# Patient Record
Sex: Female | Born: 1935 | Race: White | Hispanic: No | Marital: Married | Smoking: Former smoker
Health system: Southern US, Community
[De-identification: ages and names within clinical notes are randomized; demographics above are authoritative.]

## PROBLEM LIST (undated history)

## (undated) DIAGNOSIS — I219 Acute myocardial infarction, unspecified: Secondary | ICD-10-CM

## (undated) DIAGNOSIS — E119 Type 2 diabetes mellitus without complications: Secondary | ICD-10-CM

## (undated) DIAGNOSIS — E7800 Pure hypercholesterolemia, unspecified: Secondary | ICD-10-CM

## (undated) DIAGNOSIS — G4733 Obstructive sleep apnea (adult) (pediatric): Secondary | ICD-10-CM

## (undated) DIAGNOSIS — J189 Pneumonia, unspecified organism: Secondary | ICD-10-CM

## (undated) DIAGNOSIS — I10 Essential (primary) hypertension: Secondary | ICD-10-CM

## (undated) DIAGNOSIS — F329 Major depressive disorder, single episode, unspecified: Secondary | ICD-10-CM

## (undated) DIAGNOSIS — M25519 Pain in unspecified shoulder: Secondary | ICD-10-CM

## (undated) DIAGNOSIS — C541 Malignant neoplasm of endometrium: Secondary | ICD-10-CM

## (undated) DIAGNOSIS — C801 Malignant (primary) neoplasm, unspecified: Secondary | ICD-10-CM

## (undated) DIAGNOSIS — I119 Hypertensive heart disease without heart failure: Secondary | ICD-10-CM

## (undated) DIAGNOSIS — F32A Depression, unspecified: Secondary | ICD-10-CM

## (undated) DIAGNOSIS — I469 Cardiac arrest, cause unspecified: Secondary | ICD-10-CM

## (undated) DIAGNOSIS — F419 Anxiety disorder, unspecified: Secondary | ICD-10-CM

## (undated) DIAGNOSIS — I509 Heart failure, unspecified: Secondary | ICD-10-CM

## (undated) DIAGNOSIS — M869 Osteomyelitis, unspecified: Secondary | ICD-10-CM

## (undated) DIAGNOSIS — G4730 Sleep apnea, unspecified: Secondary | ICD-10-CM

## (undated) DIAGNOSIS — I442 Atrioventricular block, complete: Secondary | ICD-10-CM

## (undated) DIAGNOSIS — I4891 Unspecified atrial fibrillation: Secondary | ICD-10-CM

## (undated) DIAGNOSIS — L980 Pyogenic granuloma: Secondary | ICD-10-CM

## (undated) HISTORY — DX: Pure hypercholesterolemia, unspecified: E78.00

## (undated) HISTORY — DX: Osteomyelitis, unspecified: M86.9

## (undated) HISTORY — DX: Type 2 diabetes mellitus without complications: E11.9

## (undated) HISTORY — DX: Atrioventricular block, complete: I44.2

## (undated) HISTORY — PX: KNEE SURGERY: SHX244

## (undated) HISTORY — DX: Morbid (severe) obesity due to excess calories: E66.01

## (undated) HISTORY — DX: Pyogenic granuloma: L98.0

## (undated) HISTORY — DX: Cardiac arrest, cause unspecified: I46.9

## (undated) HISTORY — PX: TOE AMPUTATION: SHX809

## (undated) HISTORY — DX: Pain in unspecified shoulder: M25.519

## (undated) HISTORY — DX: Malignant (primary) neoplasm, unspecified: C80.1

## (undated) HISTORY — PX: OTHER SURGICAL HISTORY: SHX169

## (undated) HISTORY — DX: Malignant neoplasm of endometrium: C54.1

## (undated) HISTORY — DX: Sleep apnea, unspecified: G47.30

## (undated) HISTORY — DX: Hypertensive heart disease without heart failure: I11.9

## (undated) HISTORY — PX: ABDOMINAL HYSTERECTOMY: SHX81

## (undated) HISTORY — DX: Obstructive sleep apnea (adult) (pediatric): G47.33

---

## 2002-08-20 ENCOUNTER — Encounter: Payer: Self-pay | Admitting: Emergency Medicine

## 2002-08-20 ENCOUNTER — Emergency Department (HOSPITAL_COMMUNITY): Admission: EM | Admit: 2002-08-20 | Discharge: 2002-08-20 | Payer: Self-pay | Admitting: Emergency Medicine

## 2005-04-28 ENCOUNTER — Other Ambulatory Visit: Admission: RE | Admit: 2005-04-28 | Discharge: 2005-04-28 | Payer: Self-pay | Admitting: *Deleted

## 2005-05-06 ENCOUNTER — Ambulatory Visit (HOSPITAL_COMMUNITY): Admission: RE | Admit: 2005-05-06 | Discharge: 2005-05-06 | Payer: Self-pay | Admitting: Obstetrics and Gynecology

## 2005-09-19 ENCOUNTER — Inpatient Hospital Stay (HOSPITAL_COMMUNITY): Admission: EM | Admit: 2005-09-19 | Discharge: 2005-09-25 | Payer: Self-pay | Admitting: Emergency Medicine

## 2005-09-24 ENCOUNTER — Encounter (INDEPENDENT_AMBULATORY_CARE_PROVIDER_SITE_OTHER): Payer: Self-pay | Admitting: Cardiology

## 2005-10-12 ENCOUNTER — Ambulatory Visit (HOSPITAL_COMMUNITY): Admission: RE | Admit: 2005-10-12 | Discharge: 2005-10-12 | Payer: Self-pay | Admitting: *Deleted

## 2006-03-04 ENCOUNTER — Encounter: Admission: RE | Admit: 2006-03-04 | Discharge: 2006-03-04 | Payer: Self-pay | Admitting: Family Medicine

## 2007-10-18 ENCOUNTER — Emergency Department (HOSPITAL_COMMUNITY): Admission: EM | Admit: 2007-10-18 | Discharge: 2007-10-18 | Payer: Self-pay | Admitting: Emergency Medicine

## 2008-10-23 ENCOUNTER — Inpatient Hospital Stay (HOSPITAL_COMMUNITY): Admission: AD | Admit: 2008-10-23 | Discharge: 2008-10-26 | Payer: Self-pay | Admitting: Internal Medicine

## 2008-10-24 ENCOUNTER — Encounter (INDEPENDENT_AMBULATORY_CARE_PROVIDER_SITE_OTHER): Payer: Self-pay | Admitting: Internal Medicine

## 2008-10-24 ENCOUNTER — Encounter (INDEPENDENT_AMBULATORY_CARE_PROVIDER_SITE_OTHER): Payer: Self-pay | Admitting: Specialist

## 2008-10-25 ENCOUNTER — Ambulatory Visit: Payer: Self-pay | Admitting: Internal Medicine

## 2008-10-25 ENCOUNTER — Ambulatory Visit: Payer: Self-pay | Admitting: Vascular Surgery

## 2009-07-31 ENCOUNTER — Encounter (HOSPITAL_BASED_OUTPATIENT_CLINIC_OR_DEPARTMENT_OTHER): Admission: RE | Admit: 2009-07-31 | Discharge: 2009-10-29 | Payer: Self-pay | Admitting: Internal Medicine

## 2009-08-02 ENCOUNTER — Ambulatory Visit (HOSPITAL_COMMUNITY): Admission: RE | Admit: 2009-08-02 | Discharge: 2009-08-02 | Payer: Self-pay | Admitting: Internal Medicine

## 2009-10-22 ENCOUNTER — Encounter: Payer: Self-pay | Admitting: Emergency Medicine

## 2009-10-23 ENCOUNTER — Inpatient Hospital Stay (HOSPITAL_COMMUNITY): Admission: EM | Admit: 2009-10-23 | Discharge: 2009-10-25 | Payer: Self-pay | Admitting: Internal Medicine

## 2009-10-30 ENCOUNTER — Encounter (HOSPITAL_BASED_OUTPATIENT_CLINIC_OR_DEPARTMENT_OTHER): Admission: RE | Admit: 2009-10-30 | Discharge: 2009-11-04 | Payer: Self-pay | Admitting: Internal Medicine

## 2009-11-05 ENCOUNTER — Encounter (HOSPITAL_BASED_OUTPATIENT_CLINIC_OR_DEPARTMENT_OTHER): Admission: RE | Admit: 2009-11-05 | Discharge: 2009-12-09 | Payer: Self-pay | Admitting: General Surgery

## 2010-02-24 ENCOUNTER — Encounter (HOSPITAL_BASED_OUTPATIENT_CLINIC_OR_DEPARTMENT_OTHER): Admission: RE | Admit: 2010-02-24 | Discharge: 2010-05-25 | Payer: Self-pay | Admitting: Internal Medicine

## 2010-02-28 ENCOUNTER — Ambulatory Visit (HOSPITAL_COMMUNITY): Admission: RE | Admit: 2010-02-28 | Discharge: 2010-02-28 | Payer: Self-pay | Admitting: Internal Medicine

## 2010-03-03 ENCOUNTER — Ambulatory Visit (HOSPITAL_COMMUNITY): Admission: RE | Admit: 2010-03-03 | Discharge: 2010-03-03 | Payer: Self-pay | Admitting: Internal Medicine

## 2010-03-11 ENCOUNTER — Encounter: Payer: Self-pay | Admitting: Emergency Medicine

## 2010-03-18 ENCOUNTER — Encounter: Payer: Self-pay | Admitting: Emergency Medicine

## 2010-03-18 ENCOUNTER — Encounter: Admission: RE | Admit: 2010-03-18 | Discharge: 2010-03-18 | Payer: Self-pay | Admitting: Cardiology

## 2010-03-24 ENCOUNTER — Ambulatory Visit (HOSPITAL_COMMUNITY): Admission: RE | Admit: 2010-03-24 | Discharge: 2010-03-24 | Payer: Self-pay | Admitting: Cardiology

## 2010-03-27 ENCOUNTER — Encounter: Payer: Self-pay | Admitting: Emergency Medicine

## 2010-04-24 ENCOUNTER — Encounter: Payer: Self-pay | Admitting: Emergency Medicine

## 2010-04-28 DIAGNOSIS — F411 Generalized anxiety disorder: Secondary | ICD-10-CM | POA: Insufficient documentation

## 2010-04-28 DIAGNOSIS — C549 Malignant neoplasm of corpus uteri, unspecified: Secondary | ICD-10-CM

## 2010-04-28 DIAGNOSIS — I119 Hypertensive heart disease without heart failure: Secondary | ICD-10-CM

## 2010-04-28 DIAGNOSIS — I2789 Other specified pulmonary heart diseases: Secondary | ICD-10-CM

## 2010-04-28 DIAGNOSIS — E119 Type 2 diabetes mellitus without complications: Secondary | ICD-10-CM

## 2010-04-28 DIAGNOSIS — M869 Osteomyelitis, unspecified: Secondary | ICD-10-CM | POA: Insufficient documentation

## 2010-04-28 DIAGNOSIS — I4891 Unspecified atrial fibrillation: Secondary | ICD-10-CM | POA: Insufficient documentation

## 2010-04-28 DIAGNOSIS — E7800 Pure hypercholesterolemia, unspecified: Secondary | ICD-10-CM

## 2010-04-28 HISTORY — DX: Osteomyelitis, unspecified: M86.9

## 2010-04-29 ENCOUNTER — Ambulatory Visit: Payer: Self-pay | Admitting: Emergency Medicine

## 2010-04-29 DIAGNOSIS — R05 Cough: Secondary | ICD-10-CM

## 2010-04-29 LAB — CONVERTED CEMR LAB
ANA Titer 1: 1:320 {titer} — ABNORMAL HIGH
Anti Nuclear Antibody(ANA): POSITIVE — AB

## 2010-05-04 ENCOUNTER — Inpatient Hospital Stay (HOSPITAL_COMMUNITY): Admission: EM | Admit: 2010-05-04 | Discharge: 2010-05-09 | Payer: Self-pay | Admitting: Emergency Medicine

## 2010-05-04 ENCOUNTER — Ambulatory Visit: Payer: Self-pay | Admitting: Internal Medicine

## 2010-05-05 ENCOUNTER — Ambulatory Visit: Payer: Self-pay | Admitting: Vascular Surgery

## 2010-05-05 ENCOUNTER — Encounter (INDEPENDENT_AMBULATORY_CARE_PROVIDER_SITE_OTHER): Payer: Self-pay | Admitting: Internal Medicine

## 2010-05-06 ENCOUNTER — Ambulatory Visit: Payer: Self-pay | Admitting: Internal Medicine

## 2010-05-09 ENCOUNTER — Telehealth: Payer: Self-pay | Admitting: Emergency Medicine

## 2010-07-31 ENCOUNTER — Ambulatory Visit: Payer: Self-pay | Admitting: Diagnostic Radiology

## 2010-07-31 ENCOUNTER — Inpatient Hospital Stay (HOSPITAL_COMMUNITY): Admission: EM | Admit: 2010-07-31 | Discharge: 2010-08-05 | Payer: Self-pay | Admitting: Internal Medicine

## 2010-07-31 ENCOUNTER — Encounter: Payer: Self-pay | Admitting: Emergency Medicine

## 2010-08-04 ENCOUNTER — Ambulatory Visit: Payer: Self-pay | Admitting: Physical Medicine & Rehabilitation

## 2010-08-05 ENCOUNTER — Inpatient Hospital Stay (HOSPITAL_COMMUNITY)
Admission: RE | Admit: 2010-08-05 | Discharge: 2010-08-12 | Payer: Self-pay | Admitting: Physical Medicine & Rehabilitation

## 2010-09-08 ENCOUNTER — Inpatient Hospital Stay (HOSPITAL_COMMUNITY): Admission: EM | Admit: 2010-09-08 | Discharge: 2010-09-15 | Payer: Self-pay | Admitting: Emergency Medicine

## 2010-09-11 ENCOUNTER — Encounter (INDEPENDENT_AMBULATORY_CARE_PROVIDER_SITE_OTHER): Payer: Self-pay | Admitting: Internal Medicine

## 2010-10-25 ENCOUNTER — Emergency Department (HOSPITAL_COMMUNITY)
Admission: EM | Admit: 2010-10-25 | Discharge: 2010-10-25 | Payer: Self-pay | Source: Home / Self Care | Admitting: Emergency Medicine

## 2010-11-06 ENCOUNTER — Encounter (HOSPITAL_BASED_OUTPATIENT_CLINIC_OR_DEPARTMENT_OTHER)
Admission: RE | Admit: 2010-11-06 | Discharge: 2010-12-09 | Payer: Self-pay | Source: Home / Self Care | Attending: Internal Medicine | Admitting: Internal Medicine

## 2010-11-13 ENCOUNTER — Ambulatory Visit (HOSPITAL_COMMUNITY)
Admission: RE | Admit: 2010-11-13 | Discharge: 2010-11-13 | Payer: Self-pay | Source: Home / Self Care | Attending: Internal Medicine | Admitting: Internal Medicine

## 2010-11-13 LAB — COMPREHENSIVE METABOLIC PANEL
ALT: 17 U/L (ref 0–35)
AST: 28 U/L (ref 0–37)
Albumin: 3.2 g/dL — ABNORMAL LOW (ref 3.5–5.2)
Alkaline Phosphatase: 126 U/L — ABNORMAL HIGH (ref 39–117)
BUN: 40 mg/dL — ABNORMAL HIGH (ref 6–23)
CO2: 30 mEq/L (ref 19–32)
Calcium: 9.6 mg/dL (ref 8.4–10.5)
Chloride: 94 mEq/L — ABNORMAL LOW (ref 96–112)
Creatinine, Ser: 1.13 mg/dL (ref 0.4–1.2)
GFR calc Af Amer: 57 mL/min — ABNORMAL LOW (ref >60)
GFR calc non Af Amer: 47 mL/min — ABNORMAL LOW (ref >60)
Glucose, Bld: 249 mg/dL — ABNORMAL HIGH (ref 70–99)
Potassium: 4 mEq/L (ref 3.5–5.1)
Sodium: 132 mEq/L — ABNORMAL LOW (ref 135–145)
Total Bilirubin: 0.8 mg/dL (ref 0.3–1.2)
Total Protein: 8.7 g/dL — ABNORMAL HIGH (ref 6.0–8.3)

## 2010-11-13 LAB — CBC
HCT: 40.9 % (ref 36.0–46.0)
Hemoglobin: 13.8 g/dL (ref 12.0–15.0)
MCH: 29.2 pg (ref 26.0–34.0)
MCHC: 33.7 g/dL (ref 30.0–36.0)
MCV: 86.5 fL (ref 78.0–100.0)
Platelets: 233 10*3/uL (ref 150–400)
RBC: 4.73 MIL/uL (ref 3.87–5.11)
RDW: 14.3 % (ref 11.5–15.5)
WBC: 7.3 10*3/uL (ref 4.0–10.5)

## 2010-11-13 LAB — DIFFERENTIAL
Basophils Absolute: 0.1 10*3/uL (ref 0.0–0.1)
Basophils Relative: 1 % (ref 0–1)
Eosinophils Absolute: 0.2 10*3/uL (ref 0.0–0.7)
Eosinophils Relative: 3 % (ref 0–5)
Lymphocytes Relative: 27 % (ref 12–46)
Lymphs Abs: 1.9 10*3/uL (ref 0.7–4.0)
Monocytes Absolute: 0.9 10*3/uL (ref 0.1–1.0)
Monocytes Relative: 12 % (ref 3–12)
Neutro Abs: 4.2 10*3/uL (ref 1.7–7.7)
Neutrophils Relative %: 58 % (ref 43–77)

## 2010-11-13 LAB — HEMOGLOBIN A1C
Hgb A1c MFr Bld: 7.4 % — ABNORMAL HIGH (ref <5.7)
Mean Plasma Glucose: 166 mg/dL — ABNORMAL HIGH (ref <117)

## 2010-11-13 LAB — SEDIMENTATION RATE: Sed Rate: 53 mm/hr — ABNORMAL HIGH (ref 0–22)

## 2010-11-13 LAB — PREALBUMIN: Prealbumin: 19.3 mg/dL (ref 17.0–34.0)

## 2010-11-14 ENCOUNTER — Ambulatory Visit (HOSPITAL_COMMUNITY)
Admission: RE | Admit: 2010-11-14 | Discharge: 2010-11-14 | Payer: Self-pay | Source: Home / Self Care | Attending: Internal Medicine | Admitting: Internal Medicine

## 2010-11-26 LAB — GLUCOSE, CAPILLARY
Glucose-Capillary: 228 mg/dL — ABNORMAL HIGH (ref 70–99)
Glucose-Capillary: 194 mg/dL — ABNORMAL HIGH (ref 70–99)

## 2010-11-30 ENCOUNTER — Encounter: Payer: Self-pay | Admitting: Nephrology

## 2010-12-01 LAB — GLUCOSE, CAPILLARY
Glucose-Capillary: 279 mg/dL — ABNORMAL HIGH (ref 70–99)
Glucose-Capillary: 241 mg/dL — ABNORMAL HIGH (ref 70–99)

## 2010-12-02 LAB — GLUCOSE, CAPILLARY: Glucose-Capillary: 202 mg/dL — ABNORMAL HIGH (ref 70–99)

## 2010-12-03 LAB — GLUCOSE, CAPILLARY
Glucose-Capillary: 215 mg/dL — ABNORMAL HIGH (ref 70–99)
Glucose-Capillary: 185 mg/dL — ABNORMAL HIGH (ref 70–99)

## 2010-12-04 LAB — GLUCOSE, CAPILLARY: Glucose-Capillary: 197 mg/dL — ABNORMAL HIGH (ref 70–99)

## 2010-12-05 LAB — GLUCOSE, CAPILLARY: Glucose-Capillary: 225 mg/dL — ABNORMAL HIGH (ref 70–99)

## 2010-12-09 NOTE — Progress Notes (Signed)
Summary: lab results---ATC x1. No answer. WCB.  Phone Note Outgoing Call Call back at Home Phone [phone]   Call placed by: Leslye Peer MD,  May 09, 2010 2:34 PM Call placed to: Patient Summary of Call: Called to review auto-immune labs with pt. Her ANA and RF are markedly positive. I would like to send her to see rheumatology while we sort out her OSA therapy. We will try her back. Told her to call us back at [phone].  Initial call taken by: Leslye Peer MD,  May 09, 2010 2:36 PM  Follow-up for Phone Call        ATCx1. No answer. Unable to leave a msg. WCB.Michel Bickers [facility]  May 14, 2010 5:38 PM

## 2010-12-09 NOTE — Assessment & Plan Note (Signed)
Summary: pulm HTN, OSA   Visit Type:  Initial Consult Copy to:  Dr. Armanda Magic Primary Provider/Referring Provider:  Dr. Merri Brunette  CC:  Pulmonary hypertension consult. Marland Kitchen  History of Present Illness: 77 yo woman with obesity, A Fib, HTN, DM 2, OSA not able to tolerate CPAP. Also with pulm HTN, TR and decreased RV fxn by TTE performed 03/2010. Referred for progression of her pulm HTN, dyspnea when laying supine or with significant exertion. As part of her workup had CT-PA 5/11 that showed no PE, no ILD. She has also had PFT done.   She describes nocturnal obstructions and awakenings. Has tried multiple masks without being able to tolerate. Also complains of PND and cough, cough with drinking thin liquids  Preventive Screening-Counseling & Management  Alcohol-Tobacco     Alcohol drinks/day: 0     Smoking Status: quit     Packs/Day: 2.0     Year Quit: 1990     Pack years: 40  Current Medications (verified): 1)  Amlodipine Besy-Benazepril Hcl 5-20 Mg Caps (Amlodipine Besy-Benazepril Hcl) .Marland Kitchen.. 1 By Mouth Daily 2)  Diltiazem Hcl Er Beads 180 Mg Xr24h-Cap (Diltiazem Hcl Er Beads) .Marland Kitchen.. 1 By Mouth Daily 3)  Lantus 100 Unit/ml Soln (Insulin Glargine) .... As Directed 4)  Glimepiride 4 Mg Tabs (Glimepiride) .Marland Kitchen.. 1 By Mouth Two Times A Day 5)  Tramadol Hcl 50 Mg Tabs (Tramadol Hcl) .Marland Kitchen.. 1-2  By Mouth Every 6 Hours As Needed 6)  Digoxin 0.25 Mg Tabs (Digoxin) .Marland Kitchen.. 1 By Mouth Daily 7)  Sertraline Hcl 50 Mg Tabs (Sertraline Hcl) .... 1/2 By Mouth At Bedtime 8)  Xanax 0.25 Mg Tabs (Alprazolam) .Marland Kitchen.. 1 By Mouth As Directed 9)  Furosemide 40 Mg Tabs (Furosemide) .Marland Kitchen.. 1 By Mouth Two Times A Day 10)  Metoprolol Tartrate 50 Mg Tabs (Metoprolol Tartrate) .... 1/2 By Mouth Two Times A Day  Allergies: No Known Drug Allergies  Past History:  Past Surgical History: Total Abdominal Hysterectomy in 2006 Toe removed on left foot  Family History: Bone cancer---father Intestinal  cancer---mother  Social History: Patient states former smoker. Quit in 1992. 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  The patient denies productive cough, non-productive cough, coughing up blood, chest pain, irregular heartbeats, acid heartburn, indigestion, loss of appetite, weight change, abdominal pain, difficulty swallowing, sore throat, tooth/dental problems, headaches, nasal congestion/difficulty breathing through nose, sneezing, ear ache, anxiety, depression, hand/feet swelling, joint stiffness or pain, rash, change in color of mucus, and fever.    Vital Signs:  Patient profile:   77 year old female Height:      68 inches (172.72 cm) Weight:      280 pounds (127.27 kg) BMI:     42.73 O2 Sat:      97 % on Room air Temp:     97.7 degrees F (36.50 degrees C) oral Pulse rate:   94 / minute BP sitting:   122 / 80  (left arm) Cuff size:   large  Vitals Entered By: Michel Bickers CMA (April 29, 2010 1:51 PM)  O2 Sat at Rest %:  97 O2 Flow:  Room air  Serial Vital Signs/Assessments:  Comments: 2:25 PM Ambulatory Pulse Oximetry  Resting; HR__88___    02 Sat__95%ra___  Lap1 (185 feet)  HR__91%ra___   02 Sat__90___ Lap2 (185 feet)   HR_____   02 Sat_____    Lap3 (185 feet)   HR_____   02 Sat_____  ___Test Completed without Difficulty _x__Test Stopped due to: Pt c/o SOB.   By: Vernie Murders   CC: Pulmonary hypertension consult.  Is Patient Diabetic? Yes Comments Medications reviewed. Daytime phone verified. Michel Bickers [facility]  April 29, 2010 1:53 PM   Physical Exam  General:  normal appearance, healthy appearing, and obese.   Head:  normocephalic and atraumatic Eyes:  conjunctiva and sclera clear Nose:  no deformity, discharge, inflammation, or lesions Mouth:  no deformity or  lesions Neck:  no stridor Lungs:  clear bilaterally to auscultation and percussion Heart:  irregular, S4 Abdomen:  obese, NT Extremities:  trace pretibial edema, LLE with chronic wound Neurologic:  non-focal Skin:  intact without lesions or rashes Psych:  alert and cooperative; normal mood and affect; normal attention span and concentration   Echocardiogram  Procedure date:  03/11/2010  Findings:      LV fxn 55%  mild LAE mild RV dilation estimated RVSP at 55-33mmHg   CT of Chest  Procedure date:  03/18/2010  Findings:      No PE cardiomegaly mild biapical scarring   Impression & Recommendations:  Problem # 1:  PULMONARY HYPERTENSION (ICD-416.8)  I agree with Dr Mayford Knife that this is most likely due to untreated OSA, although ? whether she has primary TR also that could be contributing to her decreased RV fxn. CT-PA was reassuring. PFT show - walking oximetry today - discussed alternative treatments for OSA with her including surgery or dental device. She is willing to discuss the options, will refer her for sleep eval  - auto-immune labs today - consider R heart cath, but at this time would defer so that we can try to get the OSA addressed. may become relevant if we need to eval the TR further or if we decide to treat pulm HTN after underlying contributors have been addresssed.   Orders: TLB-Rheumatoid Factor (RA) (13086-VH) T-Antinuclear Antib (ANA) [phone]) T- * Misc. Laboratory test [phone]) Sleep Disorder Referral (Sleep Disorder) Consultation Level IV (40102) Prescription Created Electronically [phone])  Problem # 2:  COUGH (ICD-786.2)  Orders: Prescription Created Electronically [phone])  Medications Added to Medication List This Visit: 1)  Loratadine 10 Mg Tabs (Loratadine) .Marland Kitchen.. 1 by mouth once daily  Patient Instructions: 1)  Walking oximetry today showed that you do not need oxygen with activity 2)  We will refer you to see Dr Craige Cotta regarding  alternative therapies for Sleep Apnea.  3)  We will perform bloodwork today.  4)  Start loratadine 10mg  by mouth once daily  5)  Start Nasonex 2 sprays each nostril once daily  6)  Follow up with Dr Delton Coombes in 1 month to review your lab work.  Prescriptions: LORATADINE 10 MG TABS (LORATADINE) 1 by mouth once daily  #30 x 5   Entered and Authorized by:   Leslye Peer MD   Signed by:   Leslye Peer MD on 04/29/2010   Method used:   Electronically to        [facility] [phone]* (retail)       [address]       [place], [place]  [address]       Ph: [phone]       Fax: [phone]   RxID:   [id number]  Appended Document: Orders Update    Clinical Lists Changes  Orders: Added new Test order of T- * Misc. Laboratory test [phone]) - Signed

## 2010-12-09 NOTE — Letter (Signed)
Summary: [facility]  [facility]   Imported By: Lester [place] 05/01/2010 10:46:21  _____________________________________________________________________  External Attachment:    Type:   Image     Comment:   External Document

## 2010-12-10 ENCOUNTER — Other Ambulatory Visit: Payer: Self-pay

## 2010-12-10 ENCOUNTER — Encounter (HOSPITAL_BASED_OUTPATIENT_CLINIC_OR_DEPARTMENT_OTHER): Payer: Medicare Other | Attending: General Surgery

## 2010-12-10 DIAGNOSIS — E1142 Type 2 diabetes mellitus with diabetic polyneuropathy: Secondary | ICD-10-CM | POA: Insufficient documentation

## 2010-12-10 DIAGNOSIS — E1149 Type 2 diabetes mellitus with other diabetic neurological complication: Secondary | ICD-10-CM | POA: Insufficient documentation

## 2010-12-10 DIAGNOSIS — I798 Other disorders of arteries, arterioles and capillaries in diseases classified elsewhere: Secondary | ICD-10-CM | POA: Insufficient documentation

## 2010-12-10 DIAGNOSIS — M869 Osteomyelitis, unspecified: Secondary | ICD-10-CM | POA: Insufficient documentation

## 2010-12-10 DIAGNOSIS — L97509 Non-pressure chronic ulcer of other part of unspecified foot with unspecified severity: Secondary | ICD-10-CM | POA: Insufficient documentation

## 2010-12-10 DIAGNOSIS — E1169 Type 2 diabetes mellitus with other specified complication: Secondary | ICD-10-CM | POA: Insufficient documentation

## 2010-12-10 DIAGNOSIS — E1159 Type 2 diabetes mellitus with other circulatory complications: Secondary | ICD-10-CM | POA: Insufficient documentation

## 2010-12-10 DIAGNOSIS — M9080 Osteopathy in diseases classified elsewhere, unspecified site: Secondary | ICD-10-CM | POA: Insufficient documentation

## 2010-12-11 ENCOUNTER — Other Ambulatory Visit: Payer: Self-pay

## 2010-12-12 ENCOUNTER — Other Ambulatory Visit: Payer: Self-pay

## 2010-12-15 ENCOUNTER — Encounter (HOSPITAL_BASED_OUTPATIENT_CLINIC_OR_DEPARTMENT_OTHER): Payer: Medicare Other

## 2010-12-15 ENCOUNTER — Other Ambulatory Visit: Payer: Self-pay

## 2010-12-16 ENCOUNTER — Other Ambulatory Visit: Payer: Self-pay

## 2010-12-16 LAB — GLUCOSE, CAPILLARY
Glucose-Capillary: 240 mg/dL — ABNORMAL HIGH (ref 70–99)
Glucose-Capillary: 198 mg/dL — ABNORMAL HIGH (ref 70–99)
Glucose-Capillary: 220 mg/dL — ABNORMAL HIGH (ref 70–99)
Glucose-Capillary: 263 mg/dL — ABNORMAL HIGH (ref 70–99)

## 2010-12-17 ENCOUNTER — Other Ambulatory Visit: Payer: Self-pay

## 2010-12-18 LAB — GLUCOSE, CAPILLARY
Glucose-Capillary: 227 mg/dL — ABNORMAL HIGH (ref 70–99)
Glucose-Capillary: 284 mg/dL — ABNORMAL HIGH (ref 70–99)
Glucose-Capillary: 229 mg/dL — ABNORMAL HIGH (ref 70–99)

## 2010-12-22 ENCOUNTER — Other Ambulatory Visit: Payer: Self-pay

## 2010-12-23 ENCOUNTER — Other Ambulatory Visit: Payer: Self-pay

## 2010-12-23 LAB — GLUCOSE, CAPILLARY: Glucose-Capillary: 195 mg/dL — ABNORMAL HIGH (ref 70–99)

## 2010-12-24 ENCOUNTER — Other Ambulatory Visit: Payer: Self-pay

## 2010-12-24 LAB — GLUCOSE, CAPILLARY: Glucose-Capillary: 256 mg/dL — ABNORMAL HIGH (ref 70–99)

## 2010-12-25 ENCOUNTER — Other Ambulatory Visit: Payer: Self-pay

## 2010-12-26 ENCOUNTER — Other Ambulatory Visit: Payer: Self-pay

## 2010-12-26 LAB — GLUCOSE, CAPILLARY
Glucose-Capillary: 222 mg/dL — ABNORMAL HIGH (ref 70–99)
Glucose-Capillary: 199 mg/dL — ABNORMAL HIGH (ref 70–99)
Glucose-Capillary: 176 mg/dL — ABNORMAL HIGH (ref 70–99)

## 2010-12-30 ENCOUNTER — Other Ambulatory Visit: Payer: Self-pay

## 2010-12-30 LAB — GLUCOSE, CAPILLARY: Glucose-Capillary: 212 mg/dL — ABNORMAL HIGH (ref 70–99)

## 2010-12-31 ENCOUNTER — Other Ambulatory Visit: Payer: Self-pay

## 2011-01-01 ENCOUNTER — Other Ambulatory Visit: Payer: Self-pay

## 2011-01-01 LAB — GLUCOSE, CAPILLARY
Glucose-Capillary: 195 mg/dL — ABNORMAL HIGH (ref 70–99)
Glucose-Capillary: 169 mg/dL — ABNORMAL HIGH (ref 70–99)

## 2011-01-02 ENCOUNTER — Other Ambulatory Visit: Payer: Self-pay

## 2011-01-02 LAB — GLUCOSE, CAPILLARY
Glucose-Capillary: 222 mg/dL — ABNORMAL HIGH (ref 70–99)
Glucose-Capillary: 187 mg/dL — ABNORMAL HIGH (ref 70–99)

## 2011-01-05 ENCOUNTER — Other Ambulatory Visit: Payer: Self-pay

## 2011-01-06 ENCOUNTER — Other Ambulatory Visit: Payer: Self-pay

## 2011-01-07 ENCOUNTER — Other Ambulatory Visit: Payer: Self-pay

## 2011-01-08 ENCOUNTER — Other Ambulatory Visit: Payer: Self-pay

## 2011-01-08 ENCOUNTER — Encounter (HOSPITAL_BASED_OUTPATIENT_CLINIC_OR_DEPARTMENT_OTHER): Payer: Medicare Other | Attending: General Surgery

## 2011-01-08 DIAGNOSIS — E1169 Type 2 diabetes mellitus with other specified complication: Secondary | ICD-10-CM | POA: Insufficient documentation

## 2011-01-08 DIAGNOSIS — L97509 Non-pressure chronic ulcer of other part of unspecified foot with unspecified severity: Secondary | ICD-10-CM | POA: Insufficient documentation

## 2011-01-08 DIAGNOSIS — I798 Other disorders of arteries, arterioles and capillaries in diseases classified elsewhere: Secondary | ICD-10-CM | POA: Insufficient documentation

## 2011-01-08 DIAGNOSIS — E1159 Type 2 diabetes mellitus with other circulatory complications: Secondary | ICD-10-CM | POA: Insufficient documentation

## 2011-01-08 DIAGNOSIS — E1149 Type 2 diabetes mellitus with other diabetic neurological complication: Secondary | ICD-10-CM | POA: Insufficient documentation

## 2011-01-08 DIAGNOSIS — M9080 Osteopathy in diseases classified elsewhere, unspecified site: Secondary | ICD-10-CM | POA: Insufficient documentation

## 2011-01-08 DIAGNOSIS — E1142 Type 2 diabetes mellitus with diabetic polyneuropathy: Secondary | ICD-10-CM | POA: Insufficient documentation

## 2011-01-08 DIAGNOSIS — M869 Osteomyelitis, unspecified: Secondary | ICD-10-CM | POA: Insufficient documentation

## 2011-01-08 LAB — GLUCOSE, CAPILLARY: Glucose-Capillary: 208 mg/dL — ABNORMAL HIGH (ref 70–99)

## 2011-01-09 ENCOUNTER — Other Ambulatory Visit: Payer: Self-pay

## 2011-01-12 ENCOUNTER — Other Ambulatory Visit: Payer: Self-pay

## 2011-01-12 ENCOUNTER — Encounter (HOSPITAL_BASED_OUTPATIENT_CLINIC_OR_DEPARTMENT_OTHER): Payer: Medicare Other

## 2011-01-12 LAB — GLUCOSE, CAPILLARY: Glucose-Capillary: 215 mg/dL — ABNORMAL HIGH (ref 70–99)

## 2011-01-13 ENCOUNTER — Other Ambulatory Visit: Payer: Self-pay

## 2011-01-13 LAB — GLUCOSE, CAPILLARY
Glucose-Capillary: 241 mg/dL — ABNORMAL HIGH (ref 70–99)
Glucose-Capillary: 217 mg/dL — ABNORMAL HIGH (ref 70–99)

## 2011-01-14 ENCOUNTER — Encounter (HOSPITAL_BASED_OUTPATIENT_CLINIC_OR_DEPARTMENT_OTHER): Payer: Medicare Other

## 2011-01-20 LAB — BASIC METABOLIC PANEL
BUN: 10 mg/dL (ref 6–23)
BUN: 10 mg/dL (ref 6–23)
CO2: 24 mEq/L (ref 19–32)
CO2: 25 mEq/L (ref 19–32)
CO2: 27 mEq/L (ref 19–32)
Calcium: 8.6 mg/dL (ref 8.4–10.5)
Calcium: 8.7 mg/dL (ref 8.4–10.5)
Calcium: 8.8 mg/dL (ref 8.4–10.5)
Chloride: 103 mEq/L (ref 96–112)
Chloride: 105 mEq/L (ref 96–112)
Chloride: 106 mEq/L (ref 96–112)
Creatinine, Ser: 0.82 mg/dL (ref 0.4–1.2)
Creatinine, Ser: 0.84 mg/dL (ref 0.4–1.2)
Creatinine, Ser: 0.88 mg/dL (ref 0.4–1.2)
Creatinine, Ser: 0.97 mg/dL (ref 0.4–1.2)
Creatinine, Ser: 1 mg/dL (ref 0.4–1.2)
GFR calc Af Amer: >60 mL/min (ref >60)
GFR calc Af Amer: >60 mL/min (ref >60)
GFR calc Af Amer: >60 mL/min (ref >60)
GFR calc Af Amer: >60 mL/min (ref >60)
GFR calc non Af Amer: 56 mL/min — ABNORMAL LOW (ref >60)
GFR calc non Af Amer: >60 mL/min (ref >60)
GFR calc non Af Amer: >60 mL/min (ref >60)
Glucose, Bld: 80 mg/dL (ref 70–99)
Potassium: 4 mEq/L (ref 3.5–5.1)
Sodium: 136 mEq/L (ref 135–145)
Sodium: 139 mEq/L (ref 135–145)

## 2011-01-20 LAB — BODY FLUID CELL COUNT WITH DIFFERENTIAL
Lymphs, Fluid: 61 %
Monocyte-Macrophage-Serous Fluid: 26 % — ABNORMAL LOW (ref 50–90)
Neutrophil Count, Fluid: 13 % (ref 0–25)

## 2011-01-20 LAB — GLUCOSE, CAPILLARY
Glucose-Capillary: 101 mg/dL — ABNORMAL HIGH (ref 70–99)
Glucose-Capillary: 103 mg/dL — ABNORMAL HIGH (ref 70–99)
Glucose-Capillary: 103 mg/dL — ABNORMAL HIGH (ref 70–99)
Glucose-Capillary: 109 mg/dL — ABNORMAL HIGH (ref 70–99)
Glucose-Capillary: 110 mg/dL — ABNORMAL HIGH (ref 70–99)
Glucose-Capillary: 114 mg/dL — ABNORMAL HIGH (ref 70–99)
Glucose-Capillary: 114 mg/dL — ABNORMAL HIGH (ref 70–99)
Glucose-Capillary: 116 mg/dL — ABNORMAL HIGH (ref 70–99)
Glucose-Capillary: 132 mg/dL — ABNORMAL HIGH (ref 70–99)
Glucose-Capillary: 138 mg/dL — ABNORMAL HIGH (ref 70–99)
Glucose-Capillary: 145 mg/dL — ABNORMAL HIGH (ref 70–99)
Glucose-Capillary: 162 mg/dL — ABNORMAL HIGH (ref 70–99)
Glucose-Capillary: 78 mg/dL (ref 70–99)
Glucose-Capillary: 81 mg/dL (ref 70–99)
Glucose-Capillary: 82 mg/dL (ref 70–99)
Glucose-Capillary: 97 mg/dL (ref 70–99)
Glucose-Capillary: 99 mg/dL (ref 70–99)

## 2011-01-20 LAB — HEMOGLOBIN A1C: Mean Plasma Glucose: 143 mg/dL — ABNORMAL HIGH (ref <117)

## 2011-01-20 LAB — AFB CULTURE WITH SMEAR (NOT AT ARMC)

## 2011-01-20 LAB — HEPATIC FUNCTION PANEL
AST: 44 U/L — ABNORMAL HIGH (ref 0–37)
Albumin: 2.6 g/dL — ABNORMAL LOW (ref 3.5–5.2)
Total Protein: 6.9 g/dL (ref 6.0–8.3)

## 2011-01-20 LAB — CBC
HCT: 34.5 % — ABNORMAL LOW (ref 36.0–46.0)
MCH: 28.3 pg (ref 26.0–34.0)
MCH: 29 pg (ref 26.0–34.0)
MCV: 87.2 fL (ref 78.0–100.0)
MCV: 87.8 fL (ref 78.0–100.0)
Platelets: 251 10*3/uL (ref 150–400)
Platelets: 256 10*3/uL (ref 150–400)
RBC: 3.93 MIL/uL (ref 3.87–5.11)
RBC: 4.07 MIL/uL (ref 3.87–5.11)

## 2011-01-20 LAB — DIFFERENTIAL
Basophils Absolute: 0 10*3/uL (ref 0.0–0.1)
Lymphocytes Relative: 14 % (ref 12–46)
Neutro Abs: 4.6 10*3/uL (ref 1.7–7.7)

## 2011-01-20 LAB — PHOSPHORUS: Phosphorus: 3.1 mg/dL (ref 2.3–4.6)

## 2011-01-20 LAB — ALBUMIN, FLUID (OTHER): Albumin, Fluid: 1.4 g/dL

## 2011-01-20 LAB — DIGOXIN LEVEL: Digoxin Level: 0.9 ng/mL (ref 0.8–2.0)

## 2011-01-20 LAB — PROTEIN, BODY FLUID: Total protein, fluid: <3 g/dL

## 2011-01-20 LAB — BODY FLUID CULTURE: Culture: NO GROWTH

## 2011-01-20 LAB — GLUCOSE, SEROUS FLUID

## 2011-01-21 LAB — POCT I-STAT, CHEM 8
Chloride: 104 mEq/L (ref 96–112)
Glucose, Bld: 85 mg/dL (ref 70–99)
HCT: 39 % (ref 36.0–46.0)
Hemoglobin: 13.3 g/dL (ref 12.0–15.0)
Potassium: 3.7 mEq/L (ref 3.5–5.1)
Sodium: 138 mEq/L (ref 135–145)
TCO2: 25 mmol/L (ref 0–100)

## 2011-01-21 LAB — URINALYSIS, ROUTINE W REFLEX MICROSCOPIC
Nitrite: NEGATIVE
Protein, ur: NEGATIVE mg/dL
Specific Gravity, Urine: 1.013 (ref 1.005–1.030)
pH: 5.5 (ref 5.0–8.0)

## 2011-01-21 LAB — COMPREHENSIVE METABOLIC PANEL
ALT: 16 U/L (ref 0–35)
Calcium: 8.7 mg/dL (ref 8.4–10.5)
GFR calc Af Amer: >60 mL/min (ref >60)
Glucose, Bld: 85 mg/dL (ref 70–99)
Sodium: 136 mEq/L (ref 135–145)
Total Protein: 7.4 g/dL (ref 6.0–8.3)

## 2011-01-21 LAB — CBC
HCT: 36.7 % (ref 36.0–46.0)
Hemoglobin: 11.9 g/dL — ABNORMAL LOW (ref 12.0–15.0)
MCH: 28.7 pg (ref 26.0–34.0)
MCHC: 32.4 g/dL (ref 30.0–36.0)
MCV: 88.4 fL (ref 78.0–100.0)
Platelets: 275 10*3/uL (ref 150–400)
RDW: 17.2 % — ABNORMAL HIGH (ref 11.5–15.5)

## 2011-01-21 LAB — GLUCOSE, CAPILLARY
Glucose-Capillary: 122 mg/dL — ABNORMAL HIGH (ref 70–99)
Glucose-Capillary: 76 mg/dL (ref 70–99)

## 2011-01-21 LAB — DIFFERENTIAL
Basophils Relative: 1 % (ref 0–1)
Eosinophils Absolute: 0.1 10*3/uL (ref 0.0–0.7)
Lymphocytes Relative: 12 % (ref 12–46)
Lymphs Abs: 0.9 10*3/uL (ref 0.7–4.0)
Monocytes Relative: 13 % — ABNORMAL HIGH (ref 3–12)
Neutrophils Relative %: 73 % (ref 43–77)

## 2011-01-21 LAB — LIPASE, BLOOD: Lipase: 26 U/L (ref 11–59)

## 2011-01-21 LAB — TROPONIN I: Troponin I: 0.01 ng/mL (ref 0.00–0.06)

## 2011-01-22 ENCOUNTER — Other Ambulatory Visit: Payer: Self-pay

## 2011-01-22 ENCOUNTER — Other Ambulatory Visit (HOSPITAL_BASED_OUTPATIENT_CLINIC_OR_DEPARTMENT_OTHER): Payer: Self-pay | Admitting: General Surgery

## 2011-01-22 ENCOUNTER — Ambulatory Visit (HOSPITAL_COMMUNITY)
Admission: RE | Admit: 2011-01-22 | Discharge: 2011-01-22 | Disposition: A | Discharge status: Home / Self Care | Payer: Medicare Other | Source: Ambulatory Visit | Attending: General Surgery | Admitting: General Surgery

## 2011-01-22 DIAGNOSIS — M869 Osteomyelitis, unspecified: Secondary | ICD-10-CM | POA: Insufficient documentation

## 2011-01-22 DIAGNOSIS — M79675 Pain in left toe(s): Secondary | ICD-10-CM

## 2011-01-22 DIAGNOSIS — X58XXXA Exposure to other specified factors, initial encounter: Secondary | ICD-10-CM | POA: Insufficient documentation

## 2011-01-22 DIAGNOSIS — S99929A Unspecified injury of unspecified foot, initial encounter: Secondary | ICD-10-CM | POA: Insufficient documentation

## 2011-01-22 DIAGNOSIS — S8990XA Unspecified injury of unspecified lower leg, initial encounter: Secondary | ICD-10-CM | POA: Insufficient documentation

## 2011-01-22 LAB — GLUCOSE, CAPILLARY
Glucose-Capillary: 104 mg/dL — ABNORMAL HIGH (ref 70–99)
Glucose-Capillary: 128 mg/dL — ABNORMAL HIGH (ref 70–99)
Glucose-Capillary: 149 mg/dL — ABNORMAL HIGH (ref 70–99)
Glucose-Capillary: 152 mg/dL — ABNORMAL HIGH (ref 70–99)
Glucose-Capillary: 57 mg/dL — ABNORMAL LOW (ref 70–99)
Glucose-Capillary: 61 mg/dL — ABNORMAL LOW (ref 70–99)
Glucose-Capillary: 103 mg/dL — ABNORMAL HIGH (ref 70–99)
Glucose-Capillary: 105 mg/dL — ABNORMAL HIGH (ref 70–99)
Glucose-Capillary: 105 mg/dL — ABNORMAL HIGH (ref 70–99)
Glucose-Capillary: 107 mg/dL — ABNORMAL HIGH (ref 70–99)
Glucose-Capillary: 117 mg/dL — ABNORMAL HIGH (ref 70–99)
Glucose-Capillary: 120 mg/dL — ABNORMAL HIGH (ref 70–99)
Glucose-Capillary: 126 mg/dL — ABNORMAL HIGH (ref 70–99)
Glucose-Capillary: 128 mg/dL — ABNORMAL HIGH (ref 70–99)
Glucose-Capillary: 128 mg/dL — ABNORMAL HIGH (ref 70–99)
Glucose-Capillary: 129 mg/dL — ABNORMAL HIGH (ref 70–99)
Glucose-Capillary: 132 mg/dL — ABNORMAL HIGH (ref 70–99)
Glucose-Capillary: 149 mg/dL — ABNORMAL HIGH (ref 70–99)
Glucose-Capillary: 149 mg/dL — ABNORMAL HIGH (ref 70–99)
Glucose-Capillary: 163 mg/dL — ABNORMAL HIGH (ref 70–99)
Glucose-Capillary: 73 mg/dL (ref 70–99)
Glucose-Capillary: 91 mg/dL (ref 70–99)
Glucose-Capillary: 92 mg/dL (ref 70–99)

## 2011-01-22 LAB — TROPONIN I
Troponin I: 0.05 ng/mL (ref 0.00–0.06)
Troponin I: 0.12 ng/mL — ABNORMAL HIGH (ref 0.00–0.06)

## 2011-01-22 LAB — COMPREHENSIVE METABOLIC PANEL
ALT: 19 U/L (ref 0–35)
Albumin: 2.3 g/dL — ABNORMAL LOW (ref 3.5–5.2)
Albumin: 3.5 g/dL (ref 3.5–5.2)
Alkaline Phosphatase: 100 U/L (ref 39–117)
BUN: 12 mg/dL (ref 6–23)
Calcium: 9 mg/dL (ref 8.4–10.5)
Creatinine, Ser: 0.7 mg/dL (ref 0.4–1.2)
GFR calc Af Amer: >60 mL/min (ref >60)
Potassium: 3.1 mEq/L — ABNORMAL LOW (ref 3.5–5.1)
Sodium: 137 mEq/L (ref 135–145)
Total Protein: 6.4 g/dL (ref 6.0–8.3)
Total Protein: 8.2 g/dL (ref 6.0–8.3)

## 2011-01-22 LAB — CBC
HCT: 34.5 % — ABNORMAL LOW (ref 36.0–46.0)
HCT: 35.8 % — ABNORMAL LOW (ref 36.0–46.0)
HCT: 37.6 % (ref 36.0–46.0)
Hemoglobin: 11.3 g/dL — ABNORMAL LOW (ref 12.0–15.0)
Hemoglobin: 11.5 g/dL — ABNORMAL LOW (ref 12.0–15.0)
Hemoglobin: 12.8 g/dL (ref 12.0–15.0)
MCHC: 32.8 g/dL (ref 30.0–36.0)
MCHC: 34 g/dL (ref 30.0–36.0)
MCV: 87.6 fL (ref 78.0–100.0)
Platelets: 247 10*3/uL (ref 150–400)
Platelets: 269 10*3/uL (ref 150–400)
RBC: 4.02 MIL/uL (ref 3.87–5.11)
RBC: 4.18 MIL/uL (ref 3.87–5.11)
RDW: 15.5 % (ref 11.5–15.5)
RDW: 15.9 % — ABNORMAL HIGH (ref 11.5–15.5)
RDW: 15.6 % — ABNORMAL HIGH (ref 11.5–15.5)
WBC: 13 10*3/uL — ABNORMAL HIGH (ref 4.0–10.5)
WBC: 5.4 10*3/uL (ref 4.0–10.5)
WBC: 14.6 10*3/uL — ABNORMAL HIGH (ref 4.0–10.5)

## 2011-01-22 LAB — MRSA PCR SCREENING: MRSA by PCR: NEGATIVE

## 2011-01-22 LAB — URINALYSIS, ROUTINE W REFLEX MICROSCOPIC
Bilirubin Urine: NEGATIVE
Glucose, UA: NEGATIVE mg/dL
Hgb urine dipstick: NEGATIVE
Nitrite: POSITIVE — AB
Specific Gravity, Urine: 1.005 (ref 1.005–1.030)
Specific Gravity, Urine: 1.021 (ref 1.005–1.030)
Urobilinogen, UA: 1 mg/dL (ref 0.0–1.0)
pH: 6 (ref 5.0–8.0)

## 2011-01-22 LAB — DIFFERENTIAL
Basophils Absolute: 0 10*3/uL (ref 0.0–0.1)
Basophils Relative: 1 % (ref 0–1)
Basophils Relative: 0 % (ref 0–1)
Eosinophils Absolute: 0.2 10*3/uL (ref 0.0–0.7)
Eosinophils Absolute: 0 10*3/uL (ref 0.0–0.7)
Eosinophils Relative: 1 % (ref 0–5)
Eosinophils Relative: 0 % (ref 0–5)
Lymphocytes Relative: 7 % — ABNORMAL LOW (ref 12–46)
Lymphocytes Relative: 4 % — ABNORMAL LOW (ref 12–46)
Monocytes Absolute: 1 10*3/uL (ref 0.1–1.0)
Monocytes Absolute: 1.1 10*3/uL — ABNORMAL HIGH (ref 0.1–1.0)
Monocytes Relative: 18 % — ABNORMAL HIGH (ref 3–12)
Monocytes Relative: 9 % (ref 3–12)
Neutro Abs: 10.8 10*3/uL — ABNORMAL HIGH (ref 1.7–7.7)
Neutrophils Relative %: 92 % — ABNORMAL HIGH (ref 43–77)

## 2011-01-22 LAB — URINE MICROSCOPIC-ADD ON

## 2011-01-22 LAB — BASIC METABOLIC PANEL
BUN: 7 mg/dL (ref 6–23)
BUN: 13 mg/dL (ref 6–23)
BUN: 13 mg/dL (ref 6–23)
BUN: 8 mg/dL (ref 6–23)
CO2: 25 mEq/L (ref 19–32)
Calcium: 8.8 mg/dL (ref 8.4–10.5)
Calcium: 8.5 mg/dL (ref 8.4–10.5)
Calcium: 8.6 mg/dL (ref 8.4–10.5)
Chloride: 100 mEq/L (ref 96–112)
GFR calc non Af Amer: >60 mL/min (ref >60)
GFR calc non Af Amer: >60 mL/min (ref >60)
Glucose, Bld: 122 mg/dL — ABNORMAL HIGH (ref 70–99)
Glucose, Bld: 81 mg/dL (ref 70–99)
Potassium: 4 mEq/L (ref 3.5–5.1)
Potassium: 3.4 mEq/L — ABNORMAL LOW (ref 3.5–5.1)
Potassium: 3.6 mEq/L (ref 3.5–5.1)
Potassium: 3.6 mEq/L (ref 3.5–5.1)
Sodium: 131 mEq/L — ABNORMAL LOW (ref 135–145)
Sodium: 136 mEq/L (ref 135–145)

## 2011-01-22 LAB — CULTURE, BLOOD (ROUTINE X 2)
Culture  Setup Time: 201109221450
Culture  Setup Time: 201109221451
Culture: NO GROWTH
Culture: NO GROWTH

## 2011-01-22 LAB — URINE CULTURE
Colony Count: NO GROWTH
Culture: NO GROWTH
Special Requests: POSITIVE

## 2011-01-22 LAB — CARDIAC PANEL(CRET KIN+CKTOT+MB+TROPI)
CK, MB: 1.4 ng/mL (ref 0.3–4.0)
Total CK: 37 U/L (ref 7–177)

## 2011-01-22 LAB — CK TOTAL AND CKMB (NOT AT ARMC)
CK, MB: 1.8 ng/mL (ref 0.3–4.0)
CK, MB: 2.5 ng/mL (ref 0.3–4.0)
Relative Index: INVALID (ref 0.0–2.5)
Total CK: 55 U/L (ref 7–177)

## 2011-01-22 LAB — TSH: TSH: 0.514 u[IU]/mL (ref 0.350–4.500)

## 2011-01-25 LAB — CBC
HCT: 34.2 % — ABNORMAL LOW (ref 36.0–46.0)
HCT: 35.2 % — ABNORMAL LOW (ref 36.0–46.0)
MCH: 30.8 pg (ref 26.0–34.0)
MCH: 31 pg (ref 26.0–34.0)
MCH: 31.2 pg (ref 26.0–34.0)
MCH: 31.4 pg (ref 26.0–34.0)
MCHC: 33.5 g/dL (ref 30.0–36.0)
MCHC: 33.5 g/dL (ref 30.0–36.0)
MCHC: 34.3 g/dL (ref 30.0–36.0)
MCV: 92 fL (ref 78.0–100.0)
MCV: 91.6 fL (ref 78.0–100.0)
MCV: 92.1 fL (ref 78.0–100.0)
Platelets: 180 10*3/uL (ref 150–400)
Platelets: 126 10*3/uL — ABNORMAL LOW (ref 150–400)
Platelets: 152 10*3/uL (ref 150–400)
Platelets: 152 10*3/uL (ref 150–400)
RBC: 3.96 MIL/uL (ref 3.87–5.11)
RBC: 4.34 MIL/uL (ref 3.87–5.11)
RDW: 14.5 % (ref 11.5–15.5)
RDW: 14.8 % (ref 11.5–15.5)
RDW: 14.9 % (ref 11.5–15.5)
RDW: 15.7 % — ABNORMAL HIGH (ref 11.5–15.5)
WBC: 8.6 10*3/uL (ref 4.0–10.5)

## 2011-01-25 LAB — OSMOLALITY, URINE: Osmolality, Ur: 395 mOsm/kg (ref 390–1090)

## 2011-01-25 LAB — GLUCOSE, CAPILLARY
Glucose-Capillary: 134 mg/dL — ABNORMAL HIGH (ref 70–99)
Glucose-Capillary: 146 mg/dL — ABNORMAL HIGH (ref 70–99)
Glucose-Capillary: 105 mg/dL — ABNORMAL HIGH (ref 70–99)
Glucose-Capillary: 106 mg/dL — ABNORMAL HIGH (ref 70–99)
Glucose-Capillary: 116 mg/dL — ABNORMAL HIGH (ref 70–99)
Glucose-Capillary: 117 mg/dL — ABNORMAL HIGH (ref 70–99)
Glucose-Capillary: 130 mg/dL — ABNORMAL HIGH (ref 70–99)
Glucose-Capillary: 134 mg/dL — ABNORMAL HIGH (ref 70–99)
Glucose-Capillary: 142 mg/dL — ABNORMAL HIGH (ref 70–99)
Glucose-Capillary: 145 mg/dL — ABNORMAL HIGH (ref 70–99)
Glucose-Capillary: 157 mg/dL — ABNORMAL HIGH (ref 70–99)
Glucose-Capillary: 180 mg/dL — ABNORMAL HIGH (ref 70–99)
Glucose-Capillary: 195 mg/dL — ABNORMAL HIGH (ref 70–99)
Glucose-Capillary: 232 mg/dL — ABNORMAL HIGH (ref 70–99)

## 2011-01-25 LAB — BASIC METABOLIC PANEL
BUN: 20 mg/dL (ref 6–23)
BUN: 21 mg/dL (ref 6–23)
CO2: 21 mEq/L (ref 19–32)
CO2: 21 mEq/L (ref 19–32)
CO2: 24 mEq/L (ref 19–32)
CO2: 25 mEq/L (ref 19–32)
Calcium: 8.4 mg/dL (ref 8.4–10.5)
Calcium: 8.7 mg/dL (ref 8.4–10.5)
Chloride: 105 mEq/L (ref 96–112)
Creatinine, Ser: 1.03 mg/dL (ref 0.4–1.2)
Creatinine, Ser: 1.16 mg/dL (ref 0.4–1.2)
Creatinine, Ser: 1.21 mg/dL — ABNORMAL HIGH (ref 0.4–1.2)
GFR calc Af Amer: >60 mL/min (ref >60)
GFR calc non Af Amer: 41 mL/min — ABNORMAL LOW (ref >60)
GFR calc non Af Amer: 44 mL/min — ABNORMAL LOW (ref >60)
GFR calc non Af Amer: 53 mL/min — ABNORMAL LOW (ref >60)
Glucose, Bld: 110 mg/dL — ABNORMAL HIGH (ref 70–99)
Glucose, Bld: 89 mg/dL (ref 70–99)
Glucose, Bld: 98 mg/dL (ref 70–99)
Potassium: 3.1 mEq/L — ABNORMAL LOW (ref 3.5–5.1)
Potassium: 3.6 mEq/L (ref 3.5–5.1)
Sodium: 135 mEq/L (ref 135–145)
Sodium: 137 mEq/L (ref 135–145)

## 2011-01-25 LAB — POCT I-STAT 3, ART BLOOD GAS (G3+)
O2 Saturation: 95 %
pCO2 arterial: 28.8 mmHg — ABNORMAL LOW (ref 35.0–45.0)
pH, Arterial: 7.455 — ABNORMAL HIGH (ref 7.350–7.400)

## 2011-01-25 LAB — CULTURE, BLOOD (ROUTINE X 2): Culture: NO GROWTH

## 2011-01-25 LAB — DIFFERENTIAL
Basophils Absolute: 0 10*3/uL (ref 0.0–0.1)
Basophils Absolute: 0 10*3/uL (ref 0.0–0.1)
Eosinophils Absolute: 0.1 10*3/uL (ref 0.0–0.7)
Eosinophils Relative: 2 % (ref 0–5)
Lymphocytes Relative: 11 % — ABNORMAL LOW (ref 12–46)
Lymphocytes Relative: 2 % — ABNORMAL LOW (ref 12–46)
Monocytes Absolute: 0.4 10*3/uL (ref 0.1–1.0)
Monocytes Relative: 1 % — ABNORMAL LOW (ref 3–12)
Neutro Abs: 17.7 10*3/uL — ABNORMAL HIGH (ref 1.7–7.7)

## 2011-01-25 LAB — CARBOXYHEMOGLOBIN
Carboxyhemoglobin: 1.5 % (ref 0.5–1.5)
Carboxyhemoglobin: 1.6 % — ABNORMAL HIGH (ref 0.5–1.5)
O2 Saturation: 62.5 %
O2 Saturation: 62.8 %

## 2011-01-25 LAB — URINE CULTURE: Colony Count: >=100000

## 2011-01-25 LAB — URINALYSIS, ROUTINE W REFLEX MICROSCOPIC
Bilirubin Urine: NEGATIVE
Glucose, UA: NEGATIVE mg/dL
Ketones, ur: NEGATIVE mg/dL
Protein, ur: 100 mg/dL — AB
Protein, ur: 30 mg/dL — AB
Urobilinogen, UA: 1 mg/dL (ref 0.0–1.0)

## 2011-01-25 LAB — SODIUM, URINE, RANDOM: Sodium, Ur: <10 mEq/L

## 2011-01-25 LAB — LACTIC ACID, PLASMA: Lactic Acid, Venous: 4.4 mmol/L — ABNORMAL HIGH (ref 0.5–2.2)

## 2011-01-25 LAB — PROTIME-INR
INR: 1.39 (ref 0.00–1.49)
Prothrombin Time: 16.9 seconds — ABNORMAL HIGH (ref 11.6–15.2)

## 2011-01-25 LAB — COMPREHENSIVE METABOLIC PANEL
Albumin: 2.8 g/dL — ABNORMAL LOW (ref 3.5–5.2)
BUN: 18 mg/dL (ref 6–23)
Calcium: 8.4 mg/dL (ref 8.4–10.5)
Chloride: 97 mEq/L (ref 96–112)
Creatinine, Ser: 1.34 mg/dL — ABNORMAL HIGH (ref 0.4–1.2)
Total Bilirubin: 2.3 mg/dL — ABNORMAL HIGH (ref 0.3–1.2)

## 2011-01-25 LAB — DIGOXIN LEVEL: Digoxin Level: <0.2 ng/mL — ABNORMAL LOW (ref 0.8–2.0)

## 2011-01-25 LAB — URINE MICROSCOPIC-ADD ON

## 2011-01-25 LAB — D-DIMER, QUANTITATIVE: D-Dimer, Quant: 3.91 ug/mL-FEU — ABNORMAL HIGH (ref 0.00–0.48)

## 2011-01-25 LAB — CARDIAC PANEL(CRET KIN+CKTOT+MB+TROPI)
CK, MB: 15.1 ng/mL (ref 0.3–4.0)
Relative Index: 0.4 (ref 0.0–2.5)

## 2011-01-25 LAB — VANCOMYCIN, TROUGH: Vancomycin Tr: 11.3 ug/mL (ref 10.0–20.0)

## 2011-02-09 ENCOUNTER — Encounter (HOSPITAL_BASED_OUTPATIENT_CLINIC_OR_DEPARTMENT_OTHER): Payer: Medicare Other

## 2011-02-09 LAB — BASIC METABOLIC PANEL
CO2: 25 mEq/L (ref 19–32)
Chloride: 102 mEq/L (ref 96–112)
Creatinine, Ser: 0.92 mg/dL (ref 0.4–1.2)
GFR calc Af Amer: >60 mL/min (ref >60)
Glucose, Bld: 151 mg/dL — ABNORMAL HIGH (ref 70–99)

## 2011-02-09 LAB — CBC
Hemoglobin: 13.9 g/dL (ref 12.0–15.0)
MCHC: 33.3 g/dL (ref 30.0–36.0)
MCV: 93.3 fL (ref 78.0–100.0)
RBC: 4.46 MIL/uL (ref 3.87–5.11)
RDW: 15 % (ref 11.5–15.5)

## 2011-02-09 LAB — GLUCOSE, CAPILLARY: Glucose-Capillary: 252 mg/dL — ABNORMAL HIGH (ref 70–99)

## 2011-02-10 LAB — COMPREHENSIVE METABOLIC PANEL
BUN: 13 mg/dL (ref 6–23)
CO2: 28 mEq/L (ref 19–32)
Chloride: 98 mEq/L (ref 96–112)
Creatinine, Ser: 0.92 mg/dL (ref 0.4–1.2)
GFR calc non Af Amer: 60 mL/min — ABNORMAL LOW (ref >60)
Glucose, Bld: 192 mg/dL — ABNORMAL HIGH (ref 70–99)
Total Bilirubin: 1.1 mg/dL (ref 0.3–1.2)

## 2011-02-10 LAB — CBC
HCT: 44.4 % (ref 36.0–46.0)
MCV: 93.2 fL (ref 78.0–100.0)
RBC: 4.77 MIL/uL (ref 3.87–5.11)
WBC: 10.2 10*3/uL (ref 4.0–10.5)

## 2011-02-10 LAB — GLUCOSE, CAPILLARY: Glucose-Capillary: 180 mg/dL — ABNORMAL HIGH (ref 70–99)

## 2011-02-10 LAB — URINALYSIS, ROUTINE W REFLEX MICROSCOPIC
Ketones, ur: NEGATIVE mg/dL
Nitrite: NEGATIVE
Specific Gravity, Urine: 1.028 (ref 1.005–1.030)
pH: 6 (ref 5.0–8.0)

## 2011-02-10 LAB — DIFFERENTIAL
Basophils Absolute: 0 10*3/uL (ref 0.0–0.1)
Eosinophils Relative: 1 % (ref 0–5)
Lymphocytes Relative: 18 % (ref 12–46)
Neutro Abs: 7.1 10*3/uL (ref 1.7–7.7)
Neutrophils Relative %: 70 % (ref 43–77)

## 2011-02-10 LAB — C-REACTIVE PROTEIN: CRP: 13.5 mg/dL — ABNORMAL HIGH (ref <0.6)

## 2011-02-11 ENCOUNTER — Encounter (HOSPITAL_BASED_OUTPATIENT_CLINIC_OR_DEPARTMENT_OTHER): Payer: Medicare Other

## 2011-02-19 ENCOUNTER — Encounter (HOSPITAL_BASED_OUTPATIENT_CLINIC_OR_DEPARTMENT_OTHER): Payer: Medicare Other | Attending: General Surgery

## 2011-02-19 DIAGNOSIS — I798 Other disorders of arteries, arterioles and capillaries in diseases classified elsewhere: Secondary | ICD-10-CM | POA: Insufficient documentation

## 2011-02-19 DIAGNOSIS — E1149 Type 2 diabetes mellitus with other diabetic neurological complication: Secondary | ICD-10-CM | POA: Insufficient documentation

## 2011-02-19 DIAGNOSIS — E1142 Type 2 diabetes mellitus with diabetic polyneuropathy: Secondary | ICD-10-CM | POA: Insufficient documentation

## 2011-02-19 DIAGNOSIS — E1159 Type 2 diabetes mellitus with other circulatory complications: Secondary | ICD-10-CM | POA: Insufficient documentation

## 2011-02-19 DIAGNOSIS — L97509 Non-pressure chronic ulcer of other part of unspecified foot with unspecified severity: Secondary | ICD-10-CM | POA: Insufficient documentation

## 2011-02-19 DIAGNOSIS — M869 Osteomyelitis, unspecified: Secondary | ICD-10-CM | POA: Insufficient documentation

## 2011-02-19 DIAGNOSIS — M9080 Osteopathy in diseases classified elsewhere, unspecified site: Secondary | ICD-10-CM | POA: Insufficient documentation

## 2011-02-19 DIAGNOSIS — E1169 Type 2 diabetes mellitus with other specified complication: Secondary | ICD-10-CM | POA: Insufficient documentation

## 2011-03-05 NOTE — Assessment & Plan Note (Signed)
[facility]  NAME:  Tara Huff, Tara Huff               ACCOUNT NO.:  [id number]  MEDICAL RECORD NO.:  [id number]      DATE OF BIRTH:  [birth year]  PHYSICIAN:  Maxwell Caul, M.D. VISIT DATE:  03/05/2011                                  OFFICE VISIT   [name] is a lady who is being seen here for a recurrent wound on her left plantar foot over the fourth metatarsal head.  She also had a wound on the left fourth toe.  The initial course here included doxycycline, rifampin and HBO for a wound on the left fourth metatarsal head.  This resolved.  Subsequently, she developed another wound over her left fourth toe.  She has an x-ray that shows osteomyelitis in the tuft of the left fourth toe.  The actual toe itself does not harbor an actual wound currently, however, it did have one roughly a month ago.  Currently, she is on longstanding doxycycline.  She has a history of MRSA.  She has a recurrent wound over the left fourth metatarsal head. This was debrided of some surface slough and surrounding callus.  She tolerated this well.  She has been walking in a Darco shoe.  However, I think because of subluxed fourth metatarsal head and the simple mechanics of her gait, there is tremendous pressure generated over the fourth metatarsal head in the fourth toe.  IMPRESSION: 1. Osteomyelitis of the left fourth toe.  She did not tolerate the     Septra.  She is now back on doxycycline.  I think a course of     hyperbaric oxygen would be useful here adjunctive therapy to save     the continued infection in the left fourth toe. 2. Wagner II wound over the plantar left foot.  We have debrided this     as noted above.  We have changed to prisma, collagen and a thick     offloading wrap.  She is continuing with Darco healing sandal     although a Cam walker might be better.  I have also discussed the     total contact cast here.  Although total contact casting is  contraindicated in people with osteomyelitis, the actual     osteomyelitis currently is in the tuft of the fourth toe and there     is no opening here.  Therefore, I think that the total contact cast     would probably be a good option if she can tolerate it in terms of     her gait.  I have discussed this with her in great detail today and she wants to proceed.  We will see her again in a week and make a determination about the total contact cast.  I would like to have her an HBO by next week.          ______________________________ Maxwell Caul, M.D.     MGR/MEDQ  D:  03/05/2011  T:  03/05/2011  Job:  [id number]

## 2011-03-09 ENCOUNTER — Other Ambulatory Visit (HOSPITAL_BASED_OUTPATIENT_CLINIC_OR_DEPARTMENT_OTHER): Payer: Self-pay | Admitting: General Surgery

## 2011-03-09 LAB — GLUCOSE, CAPILLARY: Glucose-Capillary: 208 mg/dL — ABNORMAL HIGH (ref 70–99)

## 2011-03-10 ENCOUNTER — Other Ambulatory Visit (HOSPITAL_BASED_OUTPATIENT_CLINIC_OR_DEPARTMENT_OTHER): Payer: Self-pay | Admitting: Internal Medicine

## 2011-03-10 ENCOUNTER — Encounter (HOSPITAL_BASED_OUTPATIENT_CLINIC_OR_DEPARTMENT_OTHER): Payer: Medicare Other | Attending: Internal Medicine

## 2011-03-10 DIAGNOSIS — Z9119 Patient's noncompliance with other medical treatment and regimen: Secondary | ICD-10-CM | POA: Insufficient documentation

## 2011-03-10 DIAGNOSIS — M25879 Other specified joint disorders, unspecified ankle and foot: Secondary | ICD-10-CM | POA: Insufficient documentation

## 2011-03-10 DIAGNOSIS — L03119 Cellulitis of unspecified part of limb: Secondary | ICD-10-CM | POA: Insufficient documentation

## 2011-03-10 DIAGNOSIS — Z8614 Personal history of Methicillin resistant Staphylococcus aureus infection: Secondary | ICD-10-CM | POA: Insufficient documentation

## 2011-03-10 DIAGNOSIS — L988 Other specified disorders of the skin and subcutaneous tissue: Secondary | ICD-10-CM | POA: Insufficient documentation

## 2011-03-10 DIAGNOSIS — E1169 Type 2 diabetes mellitus with other specified complication: Secondary | ICD-10-CM | POA: Insufficient documentation

## 2011-03-10 DIAGNOSIS — Z91199 Patient's noncompliance with other medical treatment and regimen due to unspecified reason: Secondary | ICD-10-CM | POA: Insufficient documentation

## 2011-03-10 DIAGNOSIS — L02619 Cutaneous abscess of unspecified foot: Secondary | ICD-10-CM | POA: Insufficient documentation

## 2011-03-10 DIAGNOSIS — M86679 Other chronic osteomyelitis, unspecified ankle and foot: Secondary | ICD-10-CM | POA: Insufficient documentation

## 2011-03-10 LAB — GLUCOSE, CAPILLARY: Glucose-Capillary: 166 mg/dL — ABNORMAL HIGH (ref 70–99)

## 2011-03-24 NOTE — H&P (Signed)
NAMEMANNA, Tara Huff               ACCOUNT NO.:  [id number]   MEDICAL RECORD NO.:  [id number]          PATIENT TYPE:  INP   LOCATION:  1310                         FACILITY:  [facility]   PHYSICIAN:  Hollice Espy, M.D.DATE OF BIRTH:  [birth year]   DATE OF ADMISSION:  10/23/2008  DATE OF DISCHARGE:                              HISTORY & PHYSICAL   PRIMARY CARE PHYSICIAN:  Dr. Merri Brunette.   CHIEF COMPLAINT:  Foot ulcer.   HISTORY OF PRESENT ILLNESS:  Patient is a 77 year old white female, past  medical history of poorly controlled diabetes mellitus with secondary  neuropathy, hypertensive heart disease with pulmonary hypertension,  sleep apnea, chronic atrial fibrillation and medical noncompliance who  for the past several days complains of having increased pain in her left  foot.  She had an ulcer and she says that she recently took a mole off  of it herself but has continued to have some active drainage and having  increased pain that has been going on for the last several weeks.  She  saw her PCP, Dr. Katrinka Blazing, today who determined that patient was found to  have marked left foot wounds with ulcerations and secondary cellulitis  as well as a urinary tract infection.  She was concerned of the  possibility of osteomyelitis.  Dr. Katrinka Blazing had also noted  the patient to  have a UTI.  Patient was sent over to [facility] for the direct  admission and currently she is in her room.  She denies any headaches,  vision changes, dysphagia, no chest pain, palpitations, shortness of  breath, wheezing or coughing, no abdominal pain, no hematuria, no  dysuria, no constipation, no diarrhea.  She complains of chronic lower  extremity occasional pain and some increased pain in her left foot  especially when she sits upright.  Her review of systems otherwise  negative.   PAST MEDICAL HISTORY:  1. Diabetes mellitus, poorly controlled.  2. Secondary neuropathy.  3. Medical noncompliance.  4. History  of chronic atrial fibrillation but not on Coumadin.  5. History of spontaneous eye vessel hemorrhages which she believes is      secondary to the Coumadin.  6. Hypertensive heart disease.  7. Sleep apnea.  8. Pulmonary hypertension.  9. Anxiety.  10.Hyperlipidemia.   MEDICATIONS:  1. Zocor 40.  2. Diflucan 150.  3. Tramadol 50 to 100 p.o. q.6 hours p.r.n. pain.  4. Metoprolol 50.  5. Zoloft 50 nightly.  6. Xanax 0.25 p.o. q.8 hours p.r.n. anxiety.  7. Lasix 40 b.i.d.  8. Glimepiride 4 b.i.d.  9. Lantus 100 subcutaneous nightly.   SHE HAS NO KNOWN DRUG ALLERGIES.   SOCIAL HISTORY:  No tobacco, alcohol or drug use.   FAMILY HISTORY:  Noncontributory.   PHYSICAL EXAMINATION:  Patient __________, vitals are pending.  HEENT:  Normocephalic/atraumatic, mucous membranes are moist, she has no  carotid bruits.  HEART:  Irregular rhythm but rate controlled.  LUNGS:  Clear to auscultation bilaterally.  ABDOMEN:  Soft, obese, nontender, positive bowel sounds.  EXTREMITIES:  No clubbing, cyanosis; trace pitting edema.  Her left foot  from about the ankle to the toes is erythematous with cellulitis.  Her  left toe has a small ulceration with some drainage, it is now wrapped.  This is quite tender.   LAB WORK:  We ordered a CBC, BMET and a hemoglobin A1c all of which are  pending.   ASSESSMENT AND PLAN:  1. Foot ulcer, rule out osteomyelitis.  2. Diabetes mellitus, not well controlled.  3. Secondary neuropathy.  4. Chronic atrial fibrillation, is not on Coumadin.  5. Medical noncompliance.  6. Anxiety.  7. Obesity.  8. Sleep apnea.  9. History of pulmonary hypertension.   Will plan to rule out osteomyelitis, check x-ray, continue IV  antibiotics.  Will go with IV Cipro to cover both her urinary tract  infection as well as her Gram negatives and vancomycin for Staph.  Wound  care to see, will get sliding scale insulin.  Hold caffeine to prevent  episodes of rapid atrial  fibrillation.  Patient does not want to be on  any anticoagulation.  Encouraged diabetes education and follow.  Patient  is requesting Ambien for sleep disturbance, will continue this as well  plus pain control.      Hollice Espy, M.D.  Electronically Signed     SKK/MEDQ  D:  10/23/2008  T:  10/23/2008  Job:  [id number]   cc:   Dario Guardian, M.D.  Fax: [phone]

## 2011-03-24 NOTE — Op Note (Signed)
Tara Huff, Tara Huff               ACCOUNT NO.:  [id number]   MEDICAL RECORD NO.:  [id number]          PATIENT TYPE:  INP   LOCATION:  1310                         FACILITY:  [facility]   PHYSICIAN:  Erasmo Leventhal, M.D.DATE OF BIRTH:  1934-01-16   DATE OF PROCEDURE:  10/24/2008  DATE OF DISCHARGE:                               OPERATIVE REPORT   TIME:  6:00 p.m.   PREOP DIAGNOSIS:  Left foot osteomyelitis with severe bony and soft  tissue destruction, third toe.   POSTOP DIAGNOSIS:  Left foot osteomyelitis with severe bony and soft  tissue destruction, third toe.   PROCEDURE:  Left foot third toe amputation.   SURGEON:  Valma Cava   ANESTHESIA:  General.   SPECIMEN:  Toe sent to Pathology.   BLOOD LOSS:  Less than 10 cc.   DRAINS:  One Penrose.   COMPLICATIONS:  None.   TOURNIQUET TIME:  Ankle tourniquet, 16 minutes.   DISPOSITION:  PACU stable.   OPERATIVE DETAILS:  The patient's family was counseled for surgery and  desired surgical intervention.  The patient was interviewed again in the  exam room and wished to proceed.  To the operating room, placed under  general anesthesia.  The left lower extremity was elevated, scrubbed  with Betadine scrub, Betadine prep, and draped in a sterile fashion.  Local ankle Esmarch tourniquet was utilized due to her evidence of what  looked like some chronic vascular insufficiency.  The third toe was  again inspected, found to be markedly deformed in fixed extension.  A  fishmouth incision made over the dorsal aspect for a planned  disarticulation at the MTP joint.  This was carried to the volar aspect  down to the bone.  Soft tissues were excised.  The toe was removed from  the field.  I then reprepped the area again with Betadine.  Tendons were  pulled distally and cut, and allowed to retract posteriorly.  There was  no frank pus or infection at this level where the wound edges looked  good.  Hemostasis was obtained deep.   I did not compromise the supply to  the __________ part of the skin.  We copiously irrigated with some  saline and again reprepped with Betadine.  The wound edges were nicely  closed with a nylon suture over a Penrose drain which was brought out  through the incision.  We had nice wound edges closed without excessive  tension.  Tourniquet was removed.  She had nice pink skin edges at the  end and the tissue appeared to be healthy,  and no evidence of pus tracking plantar in the foot, and appeared to be  localized and completely excised at this time.  Sterile dressings were  applied to the knee.  She was awakened and taken from operating room to  the PACU in stable condition.  Sponge and needle count were correct.  No  complications or problems.           ______________________________  Erasmo Leventhal, M.D.     RAC/MEDQ  D:  10/24/2008  T:  10/25/2008  Job:  [id number]   cc:   [facility]

## 2011-03-24 NOTE — Consult Note (Signed)
NAMEJASON, Tara Huff               ACCOUNT NO.:  [id number]   MEDICAL RECORD NO.:  [id number]          PATIENT TYPE:  INP   LOCATION:  1310                         FACILITY:  [facility]   PHYSICIAN:  Erasmo Leventhal, M.D.DATE OF BIRTH:  02-02-1934   DATE OF CONSULTATION:  10/24/2008  DATE OF DISCHARGE:                                 CONSULTATION   TIME SEEN:  3:20 p.m.   HISTORY OF PRESENT ILLNESS:  Tara Huff is a 77 year old Caucasian  female patient of Dr. Katrinka Blazing who presented to her primary care physician  on October 23, 2008 with a several week history of problems with her  left third toe.  She was noted to have severe infection of the third  toe, possible infection in adjacent toes and was admitted the hospital  February 22, 2008 by Dr. Virginia Rochester, [address] [facility] x-  rays are obtained, which shows osteomyelitis of third toe, possibly the  fourth, second and great toe.  Consult was obtained for such.   The patient reports that something was removed from her third toe at  home which she describes as possibly a mole, or could even have been  chronic drainage.  Nevertheless, about 3 weeks ago.  She has continue to  watch this along as the toes continued to swell, became erythematous  with pain.  She has not had fever, chills or sweats.  She was noted to  have a UTI also at time of admission.  Evaluation with Dr. Michaelle Copas  office.   ALLERGIES:  None.   CURRENT MEDICATIONS:  Zocor, Diflucan, tramadol, metoprolol, Zoloft,  Xanax, Lasix, glipizide, Lantus, Percocet, vancomycin.   MEDICAL HISTORY:  Significant for diabetes mellitus poorly controlled,  secondary neuropathy, medical noncompliance.  Chronic atrial  fibrillation and Coumadin.  Spontaneous eye vessel hemorrhages.  Hypertensive heart disease, sleep apnea.  Pulmonary hypertension,  anxiety, hyperlipidemia.   SOCIAL HISTORY:  No tobacco, alcohol, drug use.   FAMILY HISTORY:  Noncontributory.   PHYSICAL EXAMINATION:  EXTREMITIES:  Left foot shows obvious chronic  venous insufficiency with brawny edema.  Maybe some degree of a  component of chronic lymphadenopathy and lymphatic stasis.  The foot is  mildly red and nontender.  She has obvious soft tissue destruction of  the third toe without purulence with pus draining.  Great toe mild  erythema nontender.  Skin intact.  Second toe mildly erythema mild  swelling.  Skin intact.  Nontender.  Fourth toe mild swelling.  Skin  intact.  Mild erythema.  Little toe, lateral callus.  Otherwise  unremarkable.   DIAGNOSTIC STUDIES:  White count is normal at 9.7.  No left shift.  She  has been afebrile since admission.  Other values in the chart.  X-rays  were reviewed and also reviewed the radiologist reading.  To me, there  is obvious severe destruction of the third toe middle and distal  phalanx.  There is questionable infection of the fourth toe and tufts of  the second and great toe.  Again, this is questionable at this time, by  my reading.   IMPRESSION:  Diabetic foot infection left forefoot with obvious  destructive osteomyelitis and soft tissue destruction of the third toe.  Nonviable at this time.  There is a questionable second, great and  fourth toe osteomyelitis, but clinically did not appear to be bad at  this time, and recommend observation of those toes.  Discussed this with  patient and husband, recommend third toe amputation followed by  antibiotics per infectious disease for at least 6 weeks or more, and  following the other toes clinically.  Surgical intervention as  necessary.   Also discussed the need for strengthening medical compliance to help  salvage the situation.   I think overall the prognosis is guarded at best, due to her age,  history of medical noncompliance, vascular status, and et Karie Soda.   The toes today do have good capillary refill, although palpable pulses  in the foot are difficult to  palpate.   RECOMMENDATIONS:  Amputations as noted above.  I would also get  noninvasive arterial studies while she is in the hospital to evaluate  her vascular status and she if she would be a candidate for  revascularization.  All questions were addressed with the patient and  husband in detail.  Will proceed this evening.           ______________________________  Erasmo Leventhal, M.D.     RAC/MEDQ  D:  10/24/2008  T:  10/25/2008  Job:  [id number]

## 2011-03-24 NOTE — Discharge Summary (Signed)
NAMECALINDA, Tara Huff               ACCOUNT NO.:  [id number]   MEDICAL RECORD NO.:  [id number]          PATIENT TYPE:  INP   LOCATION:  1310                         FACILITY:  [facility]   PHYSICIAN:  Hollice Espy, M.D.DATE OF BIRTH:  1934/01/16   DATE OF ADMISSION:  10/23/2008  DATE OF DISCHARGE:  10/26/2008                               DISCHARGE SUMMARY   PCP:  Dr. Merri Brunette.   CONSULTANTS ON THIS CASE:  1. Dr. Thomasena Edis with orthopedic surgery.  2. Dr. Orvan Falconer, infectious disease.   DISCHARGE DIAGNOSES:  1. Left foot 3rd toe osteomyelitis status post amputation.  2. Cellulitis of the left foot causing #1.  3. Diabetes mellitus, poorly controlled, with secondary neuropathy.  4. History of atrial fibrillation.  5. History of anxiety.  6. History of hyperlipidemia.   DISCHARGE MEDICATIONS:  Patient will continue all of her previous  medications.  These are as follows:  1. Simvastatin 40 daily.  2. Diflucan 150 daily.  3. Tramadol 50 one to 2 tabs p.o. every 6 hours p.r.n.  4. Metoprolol 50 p.o. daily.  5. Zoloft 50 p.o. q.h.s.  6. Xanax 0.25 p.o. every 8 hours p.r.n.  7. Lasix 40 p.o. b.i.d.  8. Glimepiride 4 p.o. b.i.d.  9. Lantus, there is a question, patient says that she is on 55 units      subcu q.h.s.  According to office electronic medical record, she is      on 100 units q.h.s.  We will plan patient says that she is on 76      and we will continue at 55 q.h.s.  Her PCP can correct as needed.   New medications:  1. Augmentin 875 p.o. b.i.d. x4 days.  2. Percocet 5/325 p.o. every 4 hours p.r.n. for pain.  Patient is      advised that if she has not had a bowel movement for greater than      24 hours, to use an over-the-counter laxative.   HOSPITAL COURSE:  Patient is a 77 year old white female with past  medical history of poorly controlled diabetes mellitus, as well as a  history of A-Fib who was made a direct admission after her PCP noted  that she had had  a worsening looking cellulitis with a possibility of  osteomyelitis on her left 3rd toe.  Patient was admitted.  Her labs  initially showed no signs of increased white blood cell count.  She was  started on broad-spectrum antibiotics to cover for both MRSA, as well as  gram negative.  X-rays confirmed an osteomyelitis of the 3rd toe with  destruction.  Orthopedic surgery was consulted and Dr. Thomasena Edis from  orthopedic surgery took the patient on October 24, 2008, for a left 3rd  toe amputation.  Postop she did well.  No organisms obtained from the  wound culture.  Infectious disease was consulted for antibiotic  recommendation and they felt that the osteomyelitis is probably  polymicrobic, mixed anaerobic/aerobic infection believed to be secondary  to cellulitis.  They felt that with infected bone removed she should not  need prolonged __________therapy.  They recommended covering with  Augmentin pending final culture results.  By postop day 2, on October 26, 2008, patient was doing well.  We recommended home health and PT on  discharge.  Dressing changes every other day with the drain out in 2  days.  They recommended followup with Dr. Thomasena Edis, orthopedic surgery,  in 1 week's time.  Patient is deemed medically stable for discharge.   PLAN:  Prior to discharge, we will set up home health, PT/OT, and wound  care and we will discharge the patient home.   DISPOSITION:  Improved.   ACTIVITY:  Will be as per PT/OT.   DIET:  A carb-modified, low-sodium diet.   She will follow up with Dr. Thomasena Edis from orthopedic surgery in 1 week's  time, Dr. Katrinka Blazing, her PCP, in the next 1 to 2 weeks' time.      Hollice Espy, M.D.  Electronically Signed     SKK/MEDQ  D:  10/26/2008  T:  10/26/2008  Job:  [id number]   cc:   Cliffton Asters, M.D.  Fax: [phone]   Dr. Shelle Iron, M.D.  Fax: [phone]

## 2011-03-27 NOTE — H&P (Signed)
Tara Huff, Tara Huff               ACCOUNT NO.:  [id number]   MEDICAL RECORD NO.:  [id number]          PATIENT TYPE:  EMS   LOCATION:  ED                           FACILITY:  [facility]   PHYSICIAN:  Corinna L. Lendell Caprice, MDDATE OF BIRTH:  1933-11-11   DATE OF ADMISSION:  09/19/2005  DATE OF DISCHARGE:                                HISTORY & PHYSICAL   CHIEF COMPLAINT:  Fall and could not get up.   HISTORY OF PRESENT ILLNESS:  [name] is a pleasant, 77 year old white  female with multiple medical problems who fell while preparing breakfast  this morning.  She felt lightheaded and hit her head. She subsequently was  unable to get up for an hour after which time her family members called 911.  She still has a headache.  She still just feels very weak.  She also fell  about five days ago in a parking lot while getting into her car after work.  She felt dizzy, fell onto the pavement and broke her fifth and sixth  metacarpals it sounds like of the left hand.  This has been splinted and she  has seen orthopedics for this.  She reports that she has not been eating  well.  She has lost about 30 pounds since her hysterectomy about four months  ago. She has been having intermittent watery diarrhea and intermittent  vomiting since that time as well.  Apparently she has seen Dr. Katrinka Blazing in the  office for the chronic nausea, vomiting and diarrhea and was scheduled to  see one of the [facility] gastroenterologists this coming Tuesday.  She  apparently has had no work-up on that.  She also has felt her heart racing  periodically.  She has a history of atrial fibrillation/flutter with rapid  ventricular response which was diagnosed apparently four months ago and is  being followed by Dr. Fraser Din.  She had an echocardiogram and apparently  stress test is pending.  The patient provides all history and there are no  records available. She has had no chest pain.  She has had chronic shortness  of breath since  her atrial fibrillation was diagnosed.  She has not had a  history of coronary artery disease that she knows of.  Does not have thyroid  disease that she knows of.  She has not had a change in her medications.  She is diabetic and her blood sugars have been running in the 120's.  She  has been compliant with her medication.   PAST MEDICAL HISTORY:  1.  Atrial fibrillation with rapid ventricular response.  2.  Cataracts.  3.  History of renal failure eight years ago. Apparently this was related to      an antibiotic and outlet obstruction from her description.  I have no      records of this.  4.  Cervical cancer which apparently invaded the surrounding musculature for      which she had a hysterectomy and radiation therapy at [facility] four months ago.  5.  Type 2 diabetes mellitus for  eight years.  6.  Hypertension.  7.  Allergies.  8.  Anxiety.  9.  Chronic peripheral edema.   MEDICATIONS:  1.  Hydrochlorothiazide 6.25 mg a day.  2.  Lotrel 5/20 mg a day.  3.  Zyrtec 10 mg a day.  4.  Lexapro 10 mg a day.  5.  Glucotrol XL 10 mg a day.  6.  Glucophage 500 mg p.o. twice a day.  7.  Coumadin 5 mg a day.  8.  Benazepril 20 mg a day.  9.  Lasix 40 mg a day.  10. Verapamil 240 mg a day.   ALLERGIES:  No known drug allergies.   SOCIAL HISTORY:  She works at [employer].  She is married.  She has no  drinking, smoking or drug history.   FAMILY HISTORY:  Her mother died of cancer.  She thinks it may have been  intestinal at age 29.  Her father died of old age.   PAST SURGICAL HISTORY:  Significant for the hysterectomy only.   REVIEW OF SYSTEMS:  CONSTITUTIONAL:  As above. She has also had night sweats  and subjective fevers and chills.  HEENT:  She is complaining of an occipital headache.  She was supposed to  have a cataract removal last year but this was cancelled due to her heart  racing.  CARDIOVASCULAR:  As above.  RESPIRATORY:  No cough.  GI:  No   history of ulcers.  She has never had an EGD or colonoscopy.  GU:  She  denies dysuria or hematuria although she had urinary tract infections in the  past.  MUSCULOSKELETAL:  She is complaining of low back pain after having  fallen but this is mild.  ENDOCRINE:  As above.  HEMATOLOGIC:  No history of  thromboembolism.  NEUROLOGIC:  No history of stroke or seizure. SKIN:  No  rash.  PSYCHIATRIC:  As above.   PHYSICAL EXAMINATION:  VITAL SIGNS:  Temperature 98.9, blood pressure  147/91, pulse 126, respiratory rate 22, oxygen saturation 93% on room air.  GENERAL:  The patient is an obese, white female who appears younger than her  stated age, in no acute distress.  HEENT:  Normocephalic, atraumatic.  Pupils are equal, round and reactive to  light.  Sclerae nonicteric.  Extraocular movements are intact.  She has dry  mucous membranes.  NECK:  Supple.  No carotid bruits.  No thyromegaly.  I am unable to assess  JVD due to the thickness of her neck.  LUNGS:  Diminished throughout.  No wheezes, rhonchi or rales.  CARDIOVASCULAR:  Fast and regular.  ABDOMEN:  Obese, soft, nontender.  GU/RECTAL:  Deferred.  EXTREMITIES:  She has wrinkling of her lower extremity with some mild edema.  Pulses are intact.  She has a splint on her left hand.  SKIN:  No rash, abrasions or lacerations.  NEUROLOGIC: Alert and oriented.  Cranial nerves and sensory motor exam are  intact.  PSYCHIATRIC:  Normal affect.   LABORATORY DATA:  White count 15,000, hemoglobin 11.9, hematocrit 35.7,  platelet count 256.  Complete metabolic panel is significant for a glucose  of 139, sodium 133, creatinine 1.4, AST 42, albumin 2.4; otherwise  unremarkable.  Myoglobin is greater than 500, troponin less than 0.05.  CPK-  MB is less than 2.5.  Urinalysis cloudy.  Specific gravity 1.025.  PH 5.5,  negative bilirubin, negative ketones, large blood, 100 protein, positive nitrites, moderate leukocyte esterase, 21-50 white cells, 3-6  red  cells,  many bacteria.  EKG shows atrial flutter with 2-1 conduction and a rate of  125.   ASSESSMENT/PLAN:  1.  Weakness/fall.  I feel this is multifactorial secondary to urinary tract      infection, dehydration and rapid atrial flutter.  She had negative      orthostatics taken here.  I will admit her through telemetry, give      Cardizem bolus if rapid atrial flutter __________.  She will get IV      fluids.  I will hold most of her antihypertensives except for the      verapamil due to the heart rate.  Certainly the weight loss and chronic      nausea, vomiting, and diarrhea can be contributing as well.  I will give      her a Cardizem IV bolus and she may need a Cardizem drip started if this      does not slow down her rate.  I will check a TSH.  2.  Urinary tract infection.  She will get IV Cipro and I will order a      culture.  3.  Dehydration.  See above.  4.  Recurrent chronic vomiting and diarrhea.  I will check Clostridium      difficile, Cryptosporidia, Giardia, culture. She reports that she has      had diarrhea periodically for her entire life but this is much worse.  I      will also check a lipase and a right upper quadrant ultrasound to rule      out gallbladder etiology.  5.  Hypertension.  See above.  6.  Diabetes.  I will continue Glucotrol XL but hold Glucophage for now and      give sliding scale insulin.  7.  History of atrial flutter with rapid ventricular response.  I will check      a PT/PTT and continue her Coumadin.  Also see above.  8.  History of recent hysterectomy and radiation therapy for cervical      cancer.  9.  History of renal failure in the remote past.   In addition to above, I will check a CT scan of the brain as the patient did  report hitting her head quite hard and still has a headache.  We will need  to rule out intracranial hemorrhage since she is on Coumadin.  Also if she  remains hospitalized through the weekend, I will ask for  records from Dr.  Fraser Din.  I will also give Protonix and rule out myocardial infarction.  I  will also check a prealbumin level.      Corinna L. Lendell Caprice, MD  Electronically Signed     CLS/MEDQ  D:  09/19/2005  T:  09/19/2005  Job:  [id number]   cc:   Dario Guardian, M.D.  Fax: [phone]   Meade Maw, M.D.  Fax: [phone]

## 2011-03-27 NOTE — Discharge Summary (Signed)
Tara Huff, Tara Huff               ACCOUNT NO.:  [id number]   MEDICAL RECORD NO.:  [id number]          PATIENT TYPE:  INP   LOCATION:  1412                         FACILITY:  [facility]   PHYSICIAN:  Melissa L. Ladona Ridgel, MD  DATE OF BIRTH:  03-11-1934   DATE OF ADMISSION:  09/19/2005  DATE OF DISCHARGE:  09/25/2005                                 DISCHARGE SUMMARY   DISCHARGE DIAGNOSES:  1.  Urinary tract infection.  The patient was noted to have positive urinary      tract infection by urinalysis, however, her urine culture did not grow      any specific organisms. She responded favorably to IV antibiotic therapy      with resolution of nausea, vomiting and diarrhea. At this time, she has      completed the treatment for her urinary tract infection, however, have      maintained oral antibiotics for lower extremity edema, right greater      than left, likely secondary to a low grade cellulitis related to her      lower extremity edema.  2.  Falling. This is likely secondary to #1. She has had a physical therapy      evaluation and has been ambulating always and in fact has not required      the use of a walker.  3.  Nausea, vomiting and diarrhea likely secondary to her urinary tract      infection. This has resolved at this time. She has been able to eat and      is doing well.  4.  Atrial fibrillation with rapid ventricular response. The patient had      been on verapamil for atrial fibrillation and I discussed her case with      Dr. Fraser Din. She previously has not had difficulty with rate control and      therefore likely had an exacerbation related to illness. The patient did      have an echo during the course of this hospital stay which showed a      normal ejection fraction of 55-65%. There was no wall motion      abnormality, the left ventricular wall was mildly increased. The study      could not rule out aortic valve bicuspid structure. The aortic valve was      mildly to  moderately increased, left atrium was mildly dilated. The      patient was initially maintained on a Cardizem drip and then started on      Lopressor and digoxin with adequate rate control in the 80's to 90's.      She remains in atrial fibrillation. Her INR on the day of discharge was      1.7 which is slightly down from 2 on the day prior likely secondary to      having her Coumadin held 1-2 days earlier this week after it had climbed      into the 3's. At this point, I feel comfortable sending her home on      Coumadin and asking for her primary  care physician to please check her      INR on Monday. Will ask her to take 7.5 mg of Coumadin this evening and      resume 5 mg once daily on Saturday. I requested that the patient      followup with Dr. Fraser Din in one week to evaluate therapeutic options.      Please note that the patient did have a blood pressure elevation today      for which she received some labetalol. I have resumed her Lotrel, her      Lopressor was increased slightly and she will remain on digoxin. She      also will be on Lasix 40 mg once daily. She will see her primary care      physician on Monday to evaluate her blood pressure control and again I      have asked her to followup with Dr. Fraser Din in 1 week after the holiday      to evaluate her current regimen.  5.  Diabetes.  The patient has maintained initially hypoglycemia which then      returned back to baseline with intermittent elevations in her blood      glucose. A hemoglobin A1c was unfortunately not checked during this      admission and I will request that Dr. Katrinka Blazing please check that on Monday      so that she can tailor her therapy. The patient admits to not using her      Glucometer although she states she will get down out of the attic. Her      blood sugars yesterday were 178, 132, and 161. In response to these      elevated numbers, I have increased her Metformin to 1000 mg twice daily.      She will  resume her Glucotrol at 10 mg once daily. I have asked her to      have her blood glucose checked on Monday with Dr. Katrinka Blazing.  6.  Reflux. I have continued her on protonix 40 mg once daily.  7.  Lower extremity cellulitis likely secondary to venous stasis. The      erythema noted on the right shin the day prior to admission has improved      slightly with the admission of Zosyn and I therefore will send her home      with Augmentin 500 mg every 12 hours for the next 7 days and then ask      her to be evaluated by Dr. Katrinka Blazing in the outpatient office.  8.  Depression. The patient will continue on her Lexapro 10 mg daily.  9.  Seasonal allergies. The patient will resume her Zyrtec 10 mg daily.   HISTORY OF PRESENT ILLNESS:  The patient is a pleasant 77 year old white  female who is morbidly obese and presented to the emergency room on September 19, 2005 with chief complaint of falling and inability to get off of the  ground. This is the second time that she has fallen, the first time she  actually fractured the fourth and fifth digits of left hand and was  evaluated by orthopedics. On the day of admission, the patient has fallen  while preparing breakfast and was brought in to the emergency room by her  family. She was notably weakened and was admitted to the telemetry floor  after it was noted she had A fib with rapid ventricular response. The  patient was diagnosed with a  urinary tract infection and started on  antibiotics. She was started on Cipro IV with good results. She generally  felt much improved and defervesced over the course of the first 24 hours.   The patient's atrial fibrillation was treated with a Cardizem drip after the  patient failed to respond to increased doses of verapamil. Ultimately the  patient was started on digoxin load and Lopressor and the Cardizem drip was  weaned with good results. Her heart rates have maintained in the 80's to 90's. On admission, the patient was  noted to have moderate congestive heart  failure, her cardiac enzymes were negative and her echo did not reveal any  cardiomyopathy. She was effectively diuresed and I have elected to maintain  her on at least 40 mg daily. I have discontinued her hydrochlorothiazide in  favor of the Lasix. The patient had notable diuresis in response to  additional IV Lasix and actually had decrease in lower extremity edema. At  the time of discharge, her potassium is 3.6, I will recommend a low dose  potassium repletion q. daily with frequent checks by her primary care  physician or cardiologist. Slowly the patient progressed to feeling much  better, she was able to get up, around and participate in her activities of  daily living. She remained having mild hand pain for which she will followup  with Dr. Charlann Boxer on a previously scheduled appointment November 29 at 10:15. On  the day of discharge, the patient remained clinically stable, her heart rate  while it was in atrial flutter is rate controlled on Lopressor and digoxin  and actually has increased her Lopressor to assist with her blood pressure  control. Repeat x-rays were completed on November 13 which showed resolution  of the pulmonary edema.   Please note because of the temperature spike early on and the complaint of  nausea, vomiting, diarrhea and abdominal pain, a HIDA scan was completed  which showed normal gallbladder emptying and no acute disease and therefore  it was not considered supportive of the diagnosis of cholecystitis.   Over the course of the hospital stay with the addition of antibiotics, the  patient's INR became completely elevated in the mid 3's. Her Coumadin  therefore was held on November 15 and resumed on November 16. Because of the  slight dip in her INR, I have elected to give her 7.5 of Coumadin tonight  and resume 5 mg for the next 2 days and have her INR  checked by Dr. Katrinka Blazing  on Monday. Please note stool cultures were  obtained which showed no Giardia,  no cryptosporidium and no C Dif.   On the day of discharge, the patient was clinically stable with improved  ability to ambulate on her own.   PHYSICAL EXAMINATION:  VITAL SIGNS:  Her temperature was 97, blood pressure  initially was 125/117, she was treated with her medications and it is now  120/95, heart rate ranges from 88 to 104. Her blood sugars 178, she was  treated with her oral medications the morning of discharge. Her saturation  was 94%.  GENERAL:  This is an obese white female in no acute distress. She is  normocephalic, atraumatic.  HEENT:  Pupils equal round and reactive to light. Extraocular movements  intact. Mucous membranes are moist.  NECK:  Supple. There is no JVD, no lymph nodes and no carotid bruits.  CHEST:  Clear to auscultation but decreased bilaterally likely because of  body habitus.  CARDIOVASCULAR:  Irregularly irregular, positive S1, S2. no S3, S4. No  murmurs, rubs or gallops. ABDOMEN:  Obese, nontender, distended with positive bowel sounds.  EXTREMITIES:  Show trace erythema bilaterally with dependent edema. She does  appear to have a significant amount of edema, no extremities, otherwise her  skin is wrinkled. She has 1+ pulses and mild mottling of the lower  extremities when they in the dependent position. The patient refuses to wear  TED stockings as she states in the past she has had difficulty putting them  on.   PERTINENT LABORATORY VALUES:  Please note that hemoglobin A1c was not  obtained during this hospital course, I will request that her primary care  physician please check that on behalf of the patient and in order to adjust  the patient's medical regime. Her sodium is 136, potassium 3.6, chloride 99,  CO2 27, glucose 134, BUN 11, creatinine 1.1 with digoxin level on November  17 of 0.2. Her PT 20.4 and INR of 1.7.   At this time, the patient is deemed stable for discharge to home with  followup  appointment first thing Monday morning to assess the patient's  blood sugar, her heart rate, her blood pressure, her Coumadin level/PT/INR  and her hemoglobin A1c. I have instructed the patient on the importance of  getting her blood sugars under control and checking her blood sugars and  becoming an active participant in her medical care. The patient expressed  understanding.      Melissa L. Ladona Ridgel, MD  Electronically Signed     MLT/MEDQ  D:  09/25/2005  T:  09/25/2005  Job:  [id number]   cc:   Dario Guardian, M.D.  Fax: [phone]   Madlyn Frankel. Charlann Boxer, M.D.  Fax: [phone]

## 2011-03-27 NOTE — Op Note (Signed)
[name], Tara Huff               ACCOUNT NO.:  [id number]   MEDICAL RECORD NO.:  [id number]          PATIENT TYPE:  AMB   LOCATION:  ENDO                         FACILITY:  [facility]   PHYSICIAN:  Dario Guardian, M.D. DATE OF BIRTH:  1934-05-20   DATE OF PROCEDURE:  10/12/2005  DATE OF DISCHARGE:                                 OPERATIVE REPORT   INDICATIONS FOR PROCEDURE:  Atrial fibrillation, difficulty with rate  control, and congestive heart failure.   PROCEDURE:  TEE and electrical cardioversion.   DESCRIPTION OF PROCEDURE:  After obtaining written informed consent, the  patient was brought to the endoscopy lab in the post absorptive state.  Preoperative sedation was achieved using Versed 7 mg, Phenergan 12.5 mg IV,  and Fentanyl 100 mcg IV.  The patient previously had received topical  anesthesia with Cetacaine spray and viscous lidocaine.  Following  appropriate sedation, the Omniplane endoscope was placed without difficulty.  Multiple views were obtained at the mid-esophageal, basal, and deep gastric  view.  The left atrium and the left atrial appendage was well-visualized.  There was no thrombus noted.  The Doppler pressure across the left atrial  appendage was 40 cm per second.  The right atrium was well-visualized.  There was mild tricuspid regurgitation.  The right ventricular systolic  pressure was approximately 36 mm.  The aortic valve was trileaflet.  There  was no aortic insufficiency noted.  There was trivial mitral regurgitation  noted.  There was normal LV dimension with preserved systolic function.  The  right ventricle was grossly normal.  The aorta did reveal mild atheromatous  disease throughout the aorta.  There were no identifiable clots.  Support  was then obtained.  Anesthesia was provided by Dr. Katrinka Blazing using sodium  pentothal.  Following appropriate anesthesia, the patient was electrically  cardioverted to sinus rhythm with biphasic 200 joules.  There were  no  immediate complications.   The patient was transferred to the holding area.   FINAL IMPRESSION:  Successful cardioversion to sinus rhythm.  At this point,  we will continue with Coumadin to maintain an international normalized ratio  of 2 to 3.  Will need to discuss as to whether we will use antiarrhythmic  therapy to maintain sinus rhythm.  This is the patient's first episode of  atrial fibrillation, and it was noted to occur while under sepsis condition  with a urinary tract infection.      Meade Maw, M.D.  Electronically Signed     ______________________________  Dario Guardian, M.D.    HP/MEDQ  D:  10/12/2005  T:  10/13/2005  Job:  [id number]

## 2011-04-02 ENCOUNTER — Other Ambulatory Visit (HOSPITAL_BASED_OUTPATIENT_CLINIC_OR_DEPARTMENT_OTHER): Payer: Self-pay | Admitting: Internal Medicine

## 2011-04-02 DIAGNOSIS — M869 Osteomyelitis, unspecified: Secondary | ICD-10-CM

## 2011-04-04 ENCOUNTER — Inpatient Hospital Stay (HOSPITAL_COMMUNITY)
Admission: EM | Admit: 2011-04-04 | Discharge: 2011-04-14 | DRG: 539 | Disposition: A | Discharge status: Home Health | Payer: Medicare Other | Attending: Internal Medicine | Admitting: Internal Medicine

## 2011-04-04 ENCOUNTER — Emergency Department (HOSPITAL_COMMUNITY): Payer: Medicare Other

## 2011-04-04 DIAGNOSIS — M549 Dorsalgia, unspecified: Secondary | ICD-10-CM | POA: Diagnosis present

## 2011-04-04 DIAGNOSIS — D529 Folate deficiency anemia, unspecified: Secondary | ICD-10-CM | POA: Diagnosis present

## 2011-04-04 DIAGNOSIS — K5900 Constipation, unspecified: Secondary | ICD-10-CM | POA: Diagnosis present

## 2011-04-04 DIAGNOSIS — I10 Essential (primary) hypertension: Secondary | ICD-10-CM | POA: Diagnosis present

## 2011-04-04 DIAGNOSIS — R32 Unspecified urinary incontinence: Secondary | ICD-10-CM | POA: Diagnosis present

## 2011-04-04 DIAGNOSIS — Y998 Other external cause status: Secondary | ICD-10-CM

## 2011-04-04 DIAGNOSIS — N179 Acute kidney failure, unspecified: Secondary | ICD-10-CM | POA: Diagnosis present

## 2011-04-04 DIAGNOSIS — W06XXXA Fall from bed, initial encounter: Secondary | ICD-10-CM | POA: Diagnosis present

## 2011-04-04 DIAGNOSIS — D509 Iron deficiency anemia, unspecified: Secondary | ICD-10-CM | POA: Diagnosis present

## 2011-04-04 DIAGNOSIS — L03119 Cellulitis of unspecified part of limb: Secondary | ICD-10-CM | POA: Diagnosis present

## 2011-04-04 DIAGNOSIS — IMO0002 Reserved for concepts with insufficient information to code with codable children: Secondary | ICD-10-CM | POA: Diagnosis present

## 2011-04-04 DIAGNOSIS — I428 Other cardiomyopathies: Secondary | ICD-10-CM | POA: Diagnosis present

## 2011-04-04 DIAGNOSIS — G4733 Obstructive sleep apnea (adult) (pediatric): Secondary | ICD-10-CM | POA: Diagnosis present

## 2011-04-04 DIAGNOSIS — F329 Major depressive disorder, single episode, unspecified: Secondary | ICD-10-CM | POA: Diagnosis present

## 2011-04-04 DIAGNOSIS — S98139A Complete traumatic amputation of one unspecified lesser toe, initial encounter: Secondary | ICD-10-CM

## 2011-04-04 DIAGNOSIS — I279 Pulmonary heart disease, unspecified: Secondary | ICD-10-CM | POA: Diagnosis present

## 2011-04-04 DIAGNOSIS — F3289 Other specified depressive episodes: Secondary | ICD-10-CM | POA: Diagnosis present

## 2011-04-04 DIAGNOSIS — M869 Osteomyelitis, unspecified: Principal | ICD-10-CM | POA: Diagnosis present

## 2011-04-04 DIAGNOSIS — E109 Type 1 diabetes mellitus without complications: Secondary | ICD-10-CM | POA: Diagnosis present

## 2011-04-04 DIAGNOSIS — L02419 Cutaneous abscess of limb, unspecified: Secondary | ICD-10-CM | POA: Diagnosis present

## 2011-04-04 DIAGNOSIS — A419 Sepsis, unspecified organism: Secondary | ICD-10-CM | POA: Diagnosis not present

## 2011-04-04 DIAGNOSIS — J811 Chronic pulmonary edema: Secondary | ICD-10-CM | POA: Diagnosis present

## 2011-04-04 LAB — POCT I-STAT, CHEM 8
Chloride: 107 mEq/L (ref 96–112)
Glucose, Bld: 117 mg/dL — ABNORMAL HIGH (ref 70–99)
HCT: 36 % (ref 36.0–46.0)
Potassium: 4.4 mEq/L (ref 3.5–5.1)
Sodium: 136 mEq/L (ref 135–145)

## 2011-04-04 LAB — DIFFERENTIAL
Basophils Absolute: 0 10*3/uL (ref 0.0–0.1)
Eosinophils Relative: 0 % (ref 0–5)
Lymphocytes Relative: 3 % — ABNORMAL LOW (ref 12–46)
Lymphs Abs: 0.4 10*3/uL — ABNORMAL LOW (ref 0.7–4.0)
Neutro Abs: 11.5 10*3/uL — ABNORMAL HIGH (ref 1.7–7.7)
Neutrophils Relative %: 92 % — ABNORMAL HIGH (ref 43–77)

## 2011-04-04 LAB — TROPONIN I: Troponin I: <0.3 ng/mL (ref <0.30)

## 2011-04-04 LAB — CBC
HCT: 33.3 % — ABNORMAL LOW (ref 36.0–46.0)
Hemoglobin: 11.6 g/dL — ABNORMAL LOW (ref 12.0–15.0)
MCV: 89.5 fL (ref 78.0–100.0)
RBC: 3.72 MIL/uL — ABNORMAL LOW (ref 3.87–5.11)
WBC: 12.5 10*3/uL — ABNORMAL HIGH (ref 4.0–10.5)

## 2011-04-04 LAB — PROTIME-INR
INR: 1.54 — ABNORMAL HIGH (ref 0.00–1.49)
Prothrombin Time: 18.7 seconds — ABNORMAL HIGH (ref 11.6–15.2)

## 2011-04-04 LAB — CK TOTAL AND CKMB (NOT AT ARMC)
CK, MB: 2.7 ng/mL (ref 0.3–4.0)
Total CK: 99 U/L (ref 7–177)

## 2011-04-04 LAB — APTT: aPTT: 42 seconds — ABNORMAL HIGH (ref 24–37)

## 2011-04-05 LAB — BASIC METABOLIC PANEL
BUN: 23 mg/dL (ref 6–23)
Chloride: 105 mEq/L (ref 96–112)
GFR calc non Af Amer: 57 mL/min — ABNORMAL LOW (ref >60)
Glucose, Bld: 95 mg/dL (ref 70–99)
Potassium: 4 mEq/L (ref 3.5–5.1)
Sodium: 135 mEq/L (ref 135–145)

## 2011-04-05 LAB — CBC
HCT: 33.6 % — ABNORMAL LOW (ref 36.0–46.0)
MCV: 89.8 fL (ref 78.0–100.0)
RBC: 3.74 MIL/uL — ABNORMAL LOW (ref 3.87–5.11)
RDW: 16.6 % — ABNORMAL HIGH (ref 11.5–15.5)
WBC: 8.8 10*3/uL (ref 4.0–10.5)

## 2011-04-05 LAB — GLUCOSE, CAPILLARY
Glucose-Capillary: 112 mg/dL — ABNORMAL HIGH (ref 70–99)
Glucose-Capillary: 101 mg/dL — ABNORMAL HIGH (ref 70–99)

## 2011-04-05 LAB — HEMOGLOBIN A1C: Hgb A1c MFr Bld: 8.4 % — ABNORMAL HIGH (ref <5.7)

## 2011-04-05 LAB — C-REACTIVE PROTEIN: CRP: 8 mg/dL — ABNORMAL HIGH (ref <0.6)

## 2011-04-05 LAB — PRO B NATRIURETIC PEPTIDE: Pro B Natriuretic peptide (BNP): 8193 pg/mL — ABNORMAL HIGH (ref 0–125)

## 2011-04-05 LAB — SEDIMENTATION RATE: Sed Rate: 40 mm/hr — ABNORMAL HIGH (ref 0–22)

## 2011-04-05 NOTE — H&P (Signed)
NAMEANNALYSA, MOHAMMAD               ACCOUNT NO.:  [id number]  MEDICAL RECORD NO.:  [id number]           PATIENT TYPE:  E  LOCATION:  [facility]                         FACILITY:  [facility]  PHYSICIAN:  Hilary Hertz, MD      DATE OF BIRTH:  1934/02/28  DATE OF ADMISSION:  04/04/2011                             HISTORY & PHYSICAL   PRIMARY CARE PHYSICIAN:   Dario Guardian, MD   CHIEF COMPLAINT:  Back pain.  HISTORY OF PRESENT ILLNESS:  The patient is a 77 year old woman with extensive past medical history including morbid obesity, sleep apnea, not on CPAP, reportedly moderate-to-severe pulmonary hypertension and Cor pulmonale, atrial fibrillation not on Coumadin per the patient preference, lower extremity venous insufficiency with chronic lymphedema, hypertension, diabetes, and toe amputation for osteomyelitis who presents today after rolling out of bed and falling and complaining of subsequent back pain.  The patient does not remember much of the incident, though she said she rolled out of bed and then woke on the floor with intese back pain, so came to the hospital.  She denies any numbness or tingling in her legs.  She is actually nonmobile at home so it is difficult to tell if she has any weakness in her legs.  At this point, her only complaint is being tired and having continued back pain.  The patient also reports an acute-on-chronic worsening of swelling in her lower extremities.  She was being followed closely in the [facility] and she was actually seen 2 days ago and there are some concern for a new wound on her left third toe that was worrisome for osteomyelitis.  An additional antibiotic, Levaquin was added to her regimen of doxycycline and she was supposed get an MRI of her foot to look for osteomyelitis.  The patient denies any fevers or chills.  She has no other complaints.  REVIEW OF SYSTEMS:  Review of 10 organ systems was done is negative except as stated above in  the HPI.  ALLERGIES:  No known drug allergies.  MEDICATIONS:  Per the patient's home medication bottles; 1. Hydrocodone 5/325 mg 1 tablet q.16 h. for pain. 2. Metolazone 5 mg every other day. 3. Doxycycline 100 mg twice a day for 14 days. 4. Alprazolam 1 mg prior to treatment although it is unclear what     treatments this is used for. 5. Potassium chloride 20 mEq twice a day. 6. Pramipexole 0.25 mg daily. 7. Metoprolol tartrate 25 mg twice a day. 8. Levofloxacin 500 mg daily for 14 days. 9. Diltiazem 24-hour extended release 180 mg daily. 10.Furosemide 40 mg three times a day. 11.Sertraline 50 mg daily. 12.Amlodipine/benazepril combination pill 5/20 mg daily.  PAST MEDICAL HISTORY:  Sleep apnea, not on CPAP due to intolerance, reportedly moderate-to-severe pulmonary hypertension and Cor pulmonale per discharge summary in 2011, however, the patient does not have an echo demonstrating that in the system atrial fibrillation not on Coumadin due to the patient's preferences, lower extremity insufficiency, chronic lower extremity venous insufficiency, chronic lymphedema, hypertension, osteomyelitis, left third metatarsal, status post amputation, history of toe osteomyelitis status post amputation, diabetes,  hypertension, UTI, and hysterectomy.  SOCIAL HISTORY:  The patient does not smoke, lives at home with her son and her husband.  FAMILY HISTORY:  No family history of early coronary artery disease.  PHYSICAL EXAMINATION:  VITAL SIGNS:  Blood pressure 120/67, pulse 131, respirations 27, and temperature 98.7. GENERAL:  The patient is in no acute distress.  She is sleepy but is easily arousable. HEENT:  Mucous membranes are moist. CV:  Irregularly irregular.  No appreciable murmurs. NECK:  The patient's JVD is up to her earlobe at 45 degrees with an engorged external jugular vein that is pulsatile. ABDOMEN:  Morbidly obese, soft, nontender, nondistended. EXTREMITIES:  The patient  has at least 1-2+ pitting edema bilaterally with chronic overlying skin changes due to lymphedema.  There is an extensive erythema and warmth of her left lower extremity extending up to her thigh. SKIN:  As above. NEURO:  Nonfocal.  LABORATORY DATA:  Sodium 136, potassium 4.4, chloride 107, bicarb 18, BUN 22, creatinine 1.3, last creatinine was 1.13 four months ago, glucose 117, white count 12.5, hemoglobin 11.6, platelets 164, troponin less than 0.30, INR is 1.54, CK 99, MB 2.7.  EKG, AFib with a rate of 142, no acute ischemic changes.  Plain film of the lumbar spine shows left lower lumbar spine degenerative changes.  No focal acute abnormality.  Chest x-ray shows mild cardiomegaly, central vascular congestion, suggestion of mild interstitial edema on pleural effusions. If the patient's symptoms continue, consider chest radiograph to obtain a full inspiration when the patient is clinically able.  ASSESSMENT AND PLAN:  The patient is a 77 year old woman with extensive past medical history including untreated obstructive sleep apnea, morbid obesity, pulmonary hypertension, atrial fibrillation not on Coumadin, chronic lower extremity edema and lymphedema skin changes, hypertension, diabetes, and chronic foot wound with recent concern for osteomyelitis, who presents after rolling out of bed with back pain. 1. Back pain.  This is likely musculoskeletal due to the patient's     recent fall, plain films do not show any signs of fracture.  We     will order PT/OT evaluation for the patient and do pain control     with Vicodin and can consider checking a MRI of the spine if her     pain persist despite conservative management. 2. Fluid overload.  At this time, the patient reports increasing lower     extremity edema.  Chest x-ray shows pulmonary interstitial     congestion and the patient's JVD is very elevated.  The JVP will be somewhat chronically elevated due to the patient's history of  Cor pulmonale and right-sided failure with pHTN, however, at this time     given the context the patient seems to be slightly fluid up.  We     will increase her p.o. Lasix regimen from 40 p.o. t.i.d. to 80 IV     b.i.d., slight increased from her home dose (this would be equal to 160 mg PO daily total) and watch her closely. She also has acute renal insufficiency at this time which could actually be fluid overload     as well.  We will continue the patient's home metolazone.  Since     there is no recent echo in the system given her history of Cor     pulmonale we will a repeat an echo and also check a BNP.  Pt was reportedly on digoxin in past, but this was not included in her medication bottles that she brought  in today.  Will need to clarify in am. 3. Left leg redness and warmth.  The patient's skin exam is consistent     for cellulitis and she has been recently seen in the [facility]ound Clinic     with concern for repeat osteomyelitis in her left third digit and     they are planning getting an MRI of her L foot.  We will check a sed rate and     CRP, if this is elevated can consider doing an inpatient MRI     depending on how long the patient is admitted.  We will take her     off doxycycline, Levaquin, her home antibiotics for now, and put  her on IV vanco and Zosyn.  She does have a white count and her leg     looks very suspicious for infection. 4. Diabetes.  It is unclear what the patient takes at home for her     diabetes.  We will put her on sliding scale insulin while she is     here.  We will need to clarify her insulin dosing in the morning. 5. Hypertension.  We will continue the patient's home medications of     metoprolol, diltiazem, amlodipine, benazepril, potassium     supplementation. 6. Atrial fibrillation.  We will continue the patient's home     diltiazem, metoprolol, and monitor on tele.  She is not on     anticoagulation. 7. Depression.  Continue sertraline. 8. Medication  reconcillation will need to be performed in am - pt was prevsiouly on digoxin in past, this is not included in the medication bottles that she brought to the hospital.  Will check dig level, this needs to be clarified 9. Fluid, electrolytes, nutrition, Hep-Lock IV.  Replace electrolytes     as needed.  Continue the patient's home potassium supplementation.     We will put her on a heart-healthy diabetic diet. 10. Prophylaxis.  We will put the patient on Lovenox for DVT     prophylaxis.  DISPOSITION:  The patient has reported that she is a do not resuscitate status.  At this time, she says she has filled out some paper work for this before but does not have it with her.  She states that she " would not want to be on machine and she would not want to be resuscitated." The patient will be admitted to Team 3 for further evaluation and treatment.  Social work consult was ordered as the pt is not able to do things for herself at home and has lost the ability to be functional          ______________________________ Hilary Hertz, MD     JF/MEDQ  D:  04/05/2011  T:  04/05/2011  Job:  [id number]  Electronically Signed by Hilary Hertz MD on 04/05/2011 04:04:06 AM

## 2011-04-06 ENCOUNTER — Inpatient Hospital Stay (HOSPITAL_COMMUNITY): Payer: Medicare Other

## 2011-04-06 LAB — VITAMIN B12: Vitamin B-12: 598 pg/mL (ref 211–911)

## 2011-04-06 LAB — BASIC METABOLIC PANEL
BUN: 25 mg/dL — ABNORMAL HIGH (ref 6–23)
BUN: 25 mg/dL — ABNORMAL HIGH (ref 6–23)
CO2: 23 mEq/L (ref 19–32)
CO2: 24 mEq/L (ref 19–32)
Chloride: 103 mEq/L (ref 96–112)
Chloride: 101 mEq/L (ref 96–112)
Creatinine, Ser: 1.19 mg/dL (ref 0.4–1.2)
Glucose, Bld: 96 mg/dL (ref 70–99)
Glucose, Bld: 98 mg/dL (ref 70–99)
Potassium: 3.8 mEq/L (ref 3.5–5.1)
Potassium: 3.6 mEq/L (ref 3.5–5.1)
Sodium: 135 mEq/L (ref 135–145)

## 2011-04-06 LAB — TSH: TSH: 2.101 u[IU]/mL (ref 0.350–4.500)

## 2011-04-06 LAB — GLUCOSE, CAPILLARY

## 2011-04-06 LAB — CBC
HCT: 33 % — ABNORMAL LOW (ref 36.0–46.0)
Hemoglobin: 11.1 g/dL — ABNORMAL LOW (ref 12.0–15.0)
MCV: 89.2 fL (ref 78.0–100.0)
WBC: 5.4 10*3/uL (ref 4.0–10.5)

## 2011-04-06 LAB — FOLATE: Folate: 3.1 ng/mL — ABNORMAL LOW

## 2011-04-06 LAB — IRON AND TIBC
Iron: 21 ug/dL — ABNORMAL LOW (ref 42–135)
UIBC: 180 ug/dL

## 2011-04-06 LAB — FERRITIN: Ferritin: 242 ng/mL (ref 10–291)

## 2011-04-06 LAB — MAGNESIUM: Magnesium: 1.9 mg/dL (ref 1.5–2.5)

## 2011-04-07 ENCOUNTER — Inpatient Hospital Stay (HOSPITAL_COMMUNITY): Payer: Medicare Other

## 2011-04-07 LAB — BASIC METABOLIC PANEL
CO2: 26 mEq/L (ref 19–32)
Calcium: 8.6 mg/dL (ref 8.4–10.5)
Chloride: 100 mEq/L (ref 96–112)
Creatinine, Ser: 1.37 mg/dL — ABNORMAL HIGH (ref 0.4–1.2)
GFR calc Af Amer: 46 mL/min — ABNORMAL LOW (ref >60)
Sodium: 135 mEq/L (ref 135–145)

## 2011-04-07 LAB — GLUCOSE, CAPILLARY
Glucose-Capillary: 97 mg/dL (ref 70–99)
Glucose-Capillary: 126 mg/dL — ABNORMAL HIGH (ref 70–99)
Glucose-Capillary: 96 mg/dL (ref 70–99)

## 2011-04-08 ENCOUNTER — Inpatient Hospital Stay (HOSPITAL_COMMUNITY)
Admission: RE | Admit: 2011-04-08 | Discharge: 2011-04-08 | Payer: Medicare Other | Source: Ambulatory Visit | Attending: Internal Medicine | Admitting: Internal Medicine

## 2011-04-08 LAB — BASIC METABOLIC PANEL
CO2: 27 mEq/L (ref 19–32)
Chloride: 99 mEq/L (ref 96–112)
GFR calc Af Amer: 48 mL/min — ABNORMAL LOW (ref >60)
Glucose, Bld: 87 mg/dL (ref 70–99)
Sodium: 136 mEq/L (ref 135–145)

## 2011-04-08 LAB — GLUCOSE, CAPILLARY
Glucose-Capillary: 98 mg/dL (ref 70–99)
Glucose-Capillary: 127 mg/dL — ABNORMAL HIGH (ref 70–99)
Glucose-Capillary: 137 mg/dL — ABNORMAL HIGH (ref 70–99)

## 2011-04-08 LAB — CBC
HCT: 35.2 % — ABNORMAL LOW (ref 36.0–46.0)
Hemoglobin: 11.9 g/dL — ABNORMAL LOW (ref 12.0–15.0)
MCV: 89.6 fL (ref 78.0–100.0)
WBC: 4.4 10*3/uL (ref 4.0–10.5)

## 2011-04-08 NOTE — Consult Note (Signed)
Tara Huff, Tara Huff NO.:  [id number]  MEDICAL RECORD NO.:  [id number]           PATIENT TYPE:  I  LOCATION:  2004                         FACILITY:  [facility]  PHYSICIAN:  Jake Bathe, MD      DATE OF BIRTH:  1934/06/18  DATE OF CONSULTATION:  04/07/2011 DATE OF DISCHARGE:                                CONSULTATION   CARDIOLOGIST:  Armanda Magic, MD  PRIMARY PHYSICIAN:  Dario Guardian, MD  REQUESTING PHYSICIAN:  Conley Canal, MD of [facility].  REASON FOR EVALUATION:  Questionable right atrial thrombus seen on transthoracic echocardiogram read by Dr. Mayford Knife.  HISTORY OF PRESENT ILLNESS:  A 77 year old female with longstanding history of atrial fibrillation, obesity, diastolic heart failure, cellulitis who has been diuresed on this admission by the Hospitalist team and doing quite well.  An echocardiogram was performed, which demonstrated normal ejection fraction;, however, in the right atrium, there was a bright mobile spot seen intermittently in the right atrium most likely representing a Chiari network; however, Dr. Mayford Knife cannot rule out a small mobile thrombus.  The atrium was also severely dilated due to severe tricuspid regurgitation.  Because of this finding, we were asked to consult for possible further workup and management.  She overall feels better, not had any history of stroke.  In regard to anticoagulation, this was stopped approximately 4-5 years ago after she refused to take it after a few bouts of what sounds like subconjunctival hemorrhage.  She did not have an Ophthalmology consultation performed she states.  PAST MEDICAL HISTORY:  As above.  FAMILY HISTORY:  Currently noncontributory.  SOCIAL HISTORY:  Reviewed, does not smoke, lives at home with her son and husband.  MEDICATIONS:  Her list is reviewed.  She was taking metolazone 5 mg every other day due to persistent anemia.  She also has sleep apnea and she was not  taking her CPAP due to intolerance.  Cor pulmonale noted. Rest of her medications were reviewed on dictated H and P as well as medical records.  REVIEW OF SYSTEMS:  Denies any bleeding, syncope, palpitations. Shortness of breath has improved.  Edema has improved.  Unless specified above, all other 12 review of systems negative.  PHYSICAL EXAMINATION:  VITAL SIGNS:  Pulse on arrival 131, currently in the 80s to 90s, appears to be an atrial fibrillation.  Blood pressure 106/60 to 96/56, satting 94% on room air, temperature 97.9. GENERAL:  Alert and oriented x3 in no acute distress. NECK:  Prominent JVD noted, pulsatile likely due to severe tricuspid regurgitation.  No carotid bruits. HEART:  Irregularly irregular rhythm with 2/6 systolic murmur heard at left lower sternal border.  Unable to palpate PMI. LUNGS:  Clear to auscultation bilaterally. ABDOMEN:  Soft, nontender, normoactive bowel sounds, obese. EXTREMITIES:  Edema noted bilateral lower extremities, 1 to 2+ with chronic skin changes due to lymphedema. SKIN:  As above. NEUROLOGIC:  Nonfocal.  Cranial nerves II through XII grossly intact. GU:  Deferred. RECTAL:  Deferred.  LABORATORY DATA:  Sodium 135, potassium 3.5, BUN 28, creatinine 1.37 which is increased from admission at 0.96 likely  due to diuresis. Hemoglobin A1c 8.4.  TSH 2.1.  Telemetry reviewed.  MRI of spine was halted due to the patient's inability to stay in the tube.  She does have prominent vascular structures, which are venous related to increased right-sided heart pressures.  She also has mild cardiomegaly on chest x-ray with mild interstitial edema personally viewed.  ASSESSMENT AND PLAN:  A 77 year old female with chronic atrial fibrillation with diabetes, hypertension, venous stasis, morbid obesity, obstructive sleep apnea, cor pulmonale with right atrial structure seen on echocardiogram, could not exclude thrombus. 1. Right atrial structure/abnormality -  echocardiogram discussed with     Dr. Mayford Knife and personally reviewed.  Most likely structure is a     portion of Chiari network or malformation which is embryologic     derivative.  We both agree that further illustration with TEE is     not clinically warranted.  She has not shown any clinical signs of     pulmonary embolism when discussing case with Dr. Venetia Constable.  Regardless     even if it was a small thrombus, the recommendation would be to     anticoagulate with Coumadin.  I discussed with her extensively this     recommendation and she refuses to go back on Coumadin.  As stated     above approximately 4 years ago she was having issues with     subconjunctival hemorrhage, which was quite startling to her.  She     had not seen an ophthalmologist at the time.  I explained to her     that the recommendation of her Coumadin is because her CHADS score     is elevated enough to warrant anticoagulation and she is at higher     risk for cerebrovascular accident or stroke, possible pulmonary     embolism.  She understands this risk and once again adamantly     refuses to go back on anticoagulation.  No further cardiovascular     studies are warranted at this time.  Prominent venous vasculature     is secondary to pulmonary hypertension which is secondary to     obesity and obstructive sleep apnea/cor pulmonale.  She has had CT     scan of her chest done in May 2011, which showed no emboli.     Cardiomegaly was noted at that time.  Once again, recommendation     was for anticoagulation, which the patient refuses.  Risk and     benefits have been explained at length and she is of sound mind to     make her own decision. 2. Cor pulmonale - likely contributing to JVD, severe pulmonary     hypertension, and chronic edema.  I spoke with Dr. Venetia Constable and     rightfully so she should be careful with use of metolazone every     other day.  This can cause an aggressive diuresis.  Her creatinine     has  increased slightly on this admission due to diuresis. 3. Diabetes - as above per primary team. 4. Hypertension, currently controlled.     Jake Bathe, MD     MCS/MEDQ  D:  04/07/2011  T:  04/08/2011  Job:  [id number]  Electronically Signed by Donato Schultz MD on 04/08/2011 06:38:13 AM

## 2011-04-09 LAB — GLUCOSE, CAPILLARY
Glucose-Capillary: 102 mg/dL — ABNORMAL HIGH (ref 70–99)
Glucose-Capillary: 131 mg/dL — ABNORMAL HIGH (ref 70–99)

## 2011-04-10 ENCOUNTER — Inpatient Hospital Stay (HOSPITAL_COMMUNITY): Payer: Medicare Other

## 2011-04-10 LAB — BASIC METABOLIC PANEL
BUN: 28 mg/dL — ABNORMAL HIGH (ref 6–23)
GFR calc Af Amer: 53 mL/min — ABNORMAL LOW (ref >60)
GFR calc non Af Amer: 44 mL/min — ABNORMAL LOW (ref >60)
Potassium: 3.7 mEq/L (ref 3.5–5.1)
Sodium: 135 mEq/L (ref 135–145)

## 2011-04-10 LAB — GLUCOSE, CAPILLARY
Glucose-Capillary: 120 mg/dL — ABNORMAL HIGH (ref 70–99)
Glucose-Capillary: 159 mg/dL — ABNORMAL HIGH (ref 70–99)
Glucose-Capillary: 127 mg/dL — ABNORMAL HIGH (ref 70–99)
Glucose-Capillary: 132 mg/dL — ABNORMAL HIGH (ref 70–99)

## 2011-04-11 LAB — URINALYSIS, ROUTINE W REFLEX MICROSCOPIC
Ketones, ur: NEGATIVE mg/dL
Nitrite: NEGATIVE
Protein, ur: NEGATIVE mg/dL

## 2011-04-11 LAB — BASIC METABOLIC PANEL
BUN: 31 mg/dL — ABNORMAL HIGH (ref 6–23)
Chloride: 92 mEq/L — ABNORMAL LOW (ref 96–112)
Glucose, Bld: 119 mg/dL — ABNORMAL HIGH (ref 70–99)
Potassium: 4.2 mEq/L (ref 3.5–5.1)

## 2011-04-11 LAB — GLUCOSE, CAPILLARY
Glucose-Capillary: 123 mg/dL — ABNORMAL HIGH (ref 70–99)
Glucose-Capillary: 101 mg/dL — ABNORMAL HIGH (ref 70–99)

## 2011-04-11 LAB — CBC
HCT: 35.7 % — ABNORMAL LOW (ref 36.0–46.0)
MCH: 30.3 pg (ref 26.0–34.0)
MCHC: 33.2 g/dL (ref 30.0–36.0)
MCV: 91.8 fL (ref 78.0–100.0)
Platelets: 143 10*3/uL — ABNORMAL LOW (ref 150–400)
RBC: 3.89 MIL/uL (ref 3.87–5.11)
RDW: 16 % — ABNORMAL HIGH (ref 11.5–15.5)
RDW: 16 % — ABNORMAL HIGH (ref 11.5–15.5)
WBC: 13.3 10*3/uL — ABNORMAL HIGH (ref 4.0–10.5)

## 2011-04-11 LAB — DIGOXIN LEVEL: Digoxin Level: 0.3 ng/mL — ABNORMAL LOW (ref 0.8–2.0)

## 2011-04-12 ENCOUNTER — Inpatient Hospital Stay (HOSPITAL_COMMUNITY): Payer: Medicare Other

## 2011-04-12 LAB — CBC
Hemoglobin: 11.5 g/dL — ABNORMAL LOW (ref 12.0–15.0)
Platelets: 145 10*3/uL — ABNORMAL LOW (ref 150–400)
RBC: 3.77 MIL/uL — ABNORMAL LOW (ref 3.87–5.11)
WBC: 5.9 10*3/uL (ref 4.0–10.5)

## 2011-04-12 LAB — GLUCOSE, CAPILLARY
Glucose-Capillary: 83 mg/dL (ref 70–99)
Glucose-Capillary: 142 mg/dL — ABNORMAL HIGH (ref 70–99)

## 2011-04-12 LAB — BASIC METABOLIC PANEL
CO2: 34 mEq/L — ABNORMAL HIGH (ref 19–32)
Chloride: 93 mEq/L — ABNORMAL LOW (ref 96–112)
Creatinine, Ser: 1.66 mg/dL — ABNORMAL HIGH (ref 0.4–1.2)
GFR calc Af Amer: 37 mL/min — ABNORMAL LOW (ref >60)
Potassium: 4.1 mEq/L (ref 3.5–5.1)
Sodium: 135 mEq/L (ref 135–145)

## 2011-04-13 LAB — BASIC METABOLIC PANEL
BUN: 35 mg/dL — ABNORMAL HIGH (ref 6–23)
CO2: 34 mEq/L — ABNORMAL HIGH (ref 19–32)
Chloride: 93 mEq/L — ABNORMAL LOW (ref 96–112)
Potassium: 3.6 mEq/L (ref 3.5–5.1)

## 2011-04-13 LAB — GLUCOSE, CAPILLARY
Glucose-Capillary: 116 mg/dL — ABNORMAL HIGH (ref 70–99)
Glucose-Capillary: 82 mg/dL (ref 70–99)
Glucose-Capillary: 142 mg/dL — ABNORMAL HIGH (ref 70–99)
Glucose-Capillary: 126 mg/dL — ABNORMAL HIGH (ref 70–99)

## 2011-04-14 ENCOUNTER — Encounter (HOSPITAL_BASED_OUTPATIENT_CLINIC_OR_DEPARTMENT_OTHER): Payer: Medicare Other

## 2011-04-14 LAB — BASIC METABOLIC PANEL
CO2: 33 mEq/L — ABNORMAL HIGH (ref 19–32)
Chloride: 94 mEq/L — ABNORMAL LOW (ref 96–112)
GFR calc Af Amer: 42 mL/min — ABNORMAL LOW (ref >60)
GFR calc non Af Amer: 34 mL/min — ABNORMAL LOW (ref >60)
Sodium: 135 mEq/L (ref 135–145)

## 2011-04-14 LAB — GLUCOSE, CAPILLARY: Glucose-Capillary: 113 mg/dL — ABNORMAL HIGH (ref 70–99)

## 2011-04-15 ENCOUNTER — Inpatient Hospital Stay (HOSPITAL_COMMUNITY)
Admission: EM | Admit: 2011-04-15 | Discharge: 2011-04-18 | DRG: 309 | Disposition: A | Discharge status: Home Health | Payer: Medicare Other | Attending: Internal Medicine | Admitting: Internal Medicine

## 2011-04-15 DIAGNOSIS — M9080 Osteopathy in diseases classified elsewhere, unspecified site: Secondary | ICD-10-CM | POA: Diagnosis present

## 2011-04-15 DIAGNOSIS — L02419 Cutaneous abscess of limb, unspecified: Secondary | ICD-10-CM | POA: Diagnosis present

## 2011-04-15 DIAGNOSIS — D62 Acute posthemorrhagic anemia: Secondary | ICD-10-CM | POA: Diagnosis present

## 2011-04-15 DIAGNOSIS — M869 Osteomyelitis, unspecified: Secondary | ICD-10-CM | POA: Diagnosis present

## 2011-04-15 DIAGNOSIS — E1169 Type 2 diabetes mellitus with other specified complication: Secondary | ICD-10-CM | POA: Diagnosis present

## 2011-04-15 DIAGNOSIS — N289 Disorder of kidney and ureter, unspecified: Secondary | ICD-10-CM | POA: Diagnosis present

## 2011-04-15 DIAGNOSIS — I4891 Unspecified atrial fibrillation: Principal | ICD-10-CM | POA: Diagnosis present

## 2011-04-15 DIAGNOSIS — I428 Other cardiomyopathies: Secondary | ICD-10-CM | POA: Diagnosis present

## 2011-04-15 DIAGNOSIS — L03119 Cellulitis of unspecified part of limb: Secondary | ICD-10-CM | POA: Diagnosis present

## 2011-04-15 DIAGNOSIS — R040 Epistaxis: Secondary | ICD-10-CM | POA: Diagnosis present

## 2011-04-15 DIAGNOSIS — I10 Essential (primary) hypertension: Secondary | ICD-10-CM | POA: Diagnosis present

## 2011-04-15 DIAGNOSIS — Z79899 Other long term (current) drug therapy: Secondary | ICD-10-CM

## 2011-04-15 DIAGNOSIS — F411 Generalized anxiety disorder: Secondary | ICD-10-CM | POA: Diagnosis present

## 2011-04-15 DIAGNOSIS — G4733 Obstructive sleep apnea (adult) (pediatric): Secondary | ICD-10-CM | POA: Diagnosis present

## 2011-04-15 LAB — CBC
MCH: 29.9 pg (ref 26.0–34.0)
MCHC: 33.3 g/dL (ref 30.0–36.0)
MCV: 89.7 fL (ref 78.0–100.0)
Platelets: 224 10*3/uL (ref 150–400)
RDW: 15.1 % (ref 11.5–15.5)

## 2011-04-15 LAB — GLUCOSE, CAPILLARY: Glucose-Capillary: 149 mg/dL — ABNORMAL HIGH (ref 70–99)

## 2011-04-15 LAB — POCT I-STAT, CHEM 8
BUN: 48 mg/dL — ABNORMAL HIGH (ref 6–23)
Calcium, Ion: 1.13 mmol/L (ref 1.12–1.32)
Creatinine, Ser: 1.6 mg/dL — ABNORMAL HIGH (ref 0.4–1.2)
Glucose, Bld: 141 mg/dL — ABNORMAL HIGH (ref 70–99)
Hemoglobin: 10.9 g/dL — ABNORMAL LOW (ref 12.0–15.0)
Sodium: 137 mEq/L (ref 135–145)
TCO2: 29 mmol/L (ref 0–100)

## 2011-04-15 LAB — CK TOTAL AND CKMB (NOT AT ARMC)
CK, MB: 1.8 ng/mL (ref 0.3–4.0)
Total CK: 21 U/L (ref 7–177)

## 2011-04-15 LAB — TROPONIN I: Troponin I: <0.3 ng/mL (ref <0.30)

## 2011-04-15 LAB — SAMPLE TO BLOOD BANK

## 2011-04-15 LAB — PROTIME-INR: Prothrombin Time: 16.4 seconds — ABNORMAL HIGH (ref 11.6–15.2)

## 2011-04-16 LAB — CULTURE, BLOOD (ROUTINE X 2): Culture: NO GROWTH

## 2011-04-16 LAB — BASIC METABOLIC PANEL
Calcium: 8.7 mg/dL (ref 8.4–10.5)
GFR calc Af Amer: >60 mL/min (ref >60)
GFR calc non Af Amer: 51 mL/min — ABNORMAL LOW (ref >60)
Potassium: 3.4 mEq/L — ABNORMAL LOW (ref 3.5–5.1)
Sodium: 136 mEq/L (ref 135–145)

## 2011-04-16 LAB — CBC
HCT: 26.3 % — ABNORMAL LOW (ref 36.0–46.0)
Hemoglobin: 9 g/dL — ABNORMAL LOW (ref 12.0–15.0)
MCV: 88.9 fL (ref 78.0–100.0)
WBC: 6.8 10*3/uL (ref 4.0–10.5)

## 2011-04-16 LAB — GLUCOSE, CAPILLARY
Glucose-Capillary: 105 mg/dL — ABNORMAL HIGH (ref 70–99)
Glucose-Capillary: 178 mg/dL — ABNORMAL HIGH (ref 70–99)
Glucose-Capillary: 117 mg/dL — ABNORMAL HIGH (ref 70–99)
Glucose-Capillary: 149 mg/dL — ABNORMAL HIGH (ref 70–99)

## 2011-04-16 LAB — CARDIAC PANEL(CRET KIN+CKTOT+MB+TROPI)
Relative Index: INVALID (ref 0.0–2.5)
Total CK: 23 U/L (ref 7–177)
Troponin I: <0.3 ng/mL (ref <0.30)

## 2011-04-17 LAB — CBC
Hemoglobin: 8.5 g/dL — ABNORMAL LOW (ref 12.0–15.0)
MCH: 30.2 pg (ref 26.0–34.0)
MCHC: 33.5 g/dL (ref 30.0–36.0)
Platelets: 215 10*3/uL (ref 150–400)
RDW: 16 % — ABNORMAL HIGH (ref 11.5–15.5)

## 2011-04-17 LAB — GLUCOSE, CAPILLARY
Glucose-Capillary: 124 mg/dL — ABNORMAL HIGH (ref 70–99)
Glucose-Capillary: 193 mg/dL — ABNORMAL HIGH (ref 70–99)

## 2011-04-17 LAB — BASIC METABOLIC PANEL
BUN: 25 mg/dL — ABNORMAL HIGH (ref 6–23)
Calcium: 8.5 mg/dL (ref 8.4–10.5)
Chloride: 103 mEq/L (ref 96–112)
Creatinine, Ser: 1.1 mg/dL (ref 0.4–1.2)

## 2011-04-18 LAB — GLUCOSE, CAPILLARY
Glucose-Capillary: 113 mg/dL — ABNORMAL HIGH (ref 70–99)
Glucose-Capillary: 149 mg/dL — ABNORMAL HIGH (ref 70–99)

## 2011-04-18 LAB — HEMOGLOBIN: Hemoglobin: 9.1 g/dL — ABNORMAL LOW (ref 12.0–15.0)

## 2011-04-20 NOTE — Consult Note (Signed)
[name], Huff NO.:  [id number]  MEDICAL RECORD NO.:  [id number]           PATIENT TYPE:  I  LOCATION:  2004                         FACILITY:  [facility]  PHYSICIAN:  Alvy Beal, MD    DATE OF BIRTH:  [birth year]  DATE OF CONSULTATION:  04/08/2011 DATE OF DISCHARGE:                                CONSULTATION   Consultation requested by [facility] for evaluation of chronic lower extremity left foot ulcers and cellulitis and back pain.  HISTORY OF PRESENT ILLNESS:  Tara Huff is a pleasant 77 year old woman with multiple medical issues who was admitted 4 days ago after sustaining a fall at home.  The patient was admitted on Apr 04, 2011 with acute back pain.  She states that her underlying medical history includes morbid obesity, sleep apnea, pulmonary hypertension, cor pulmonale, atrial fibrillation, not on Coumadin per the patient's preference, the lower extremity chronic venous stasis ulcers, lymphedema, hypertension, diabetes, toe amputation several years ago for osteo who was followed in the wound care clinic with known osteo of the second, fourth and fifth toes who has been treated conservatively.  The patient also has known bilateral lower extremity cellulitis.  The patient was admitted because of significant pain after the fall after she rolled out of bed.  The patient has been here Medical Service.  As a result of the ongoing issues, Orthopedic consultation was requested today.  The patient's medications on arrival include hydrocodone, Metrozine, doxycycline, aripiprazole, potassium, pramipexole, metoprolol, levofloxacin, diltiazem, furosemide,  sertraline and amlodipine.  MEDICAL HISTORY:  Includes sleep apnea, not on CPAP; pulmonary tension; cor pulmonale; bilateral venous stasis ulcers; history of the UTI; hysterectomy, hypertension, diabetes; total knee amputation; chronic lower extremity venous insufficiency.  Followed in  the wound care clinic.  CLINICAL EXAMINATION:  The patient currently is in a chair.  She has significant lower extremity edema which is according to the patient unchanged, significant erythema and redness consistent with cellulitis with significant left foot ulcerations and obvious soft tissue changes. She has diminished 2+ pitting edema in the lower extremities, chronic overlying skin changes due to the lymphedema, significant erythema and warmth in bilateral lower extremities.  MRI of the left foot demonstrates improved osteomyelitis in the second digit, but there continues to be abnormal edema and destruction at the head of the second metatarsal, worse and osseous changes in the fourth and fifth metatarsals, questionable changes in the mid foot.  MRI of the tib-fib of the left demonstrates no osteomyelitis.  Changes consistent with cellulitis.  MRI of the spine was limited due to the patient not tolerating it, but it did not appear to be any acute changes.  There is a small questionable old compression deformity at L2.  At this point in time, the patient has multiple medical issues.  I do not think there is anything that could be done for her chronic pain in her back other than pain medical management.  From the standpoint of her foot, the patient has chronic venous stasis ulcers.  She is followed in the wound clinic and it does not appear to be any  significant change over the 4 days she has been here or from her previous wound care evaluation.  At this point because of the complexity of her diabetic foot ulceration, I would like to involve the care of my partner, Dr. Lestine Box, her foot and ankle specialist.  Personally this is beyond my scope of expertise and I am not equipped to handle her significant issue.  The patient may require a mid foot amputation because of the multiple areas of osteo.  I will defer this to Dr. Lestine Box, in the meantime defer pain management to the primary  care service.  I will speak to Dr. Lestine Box. He will continue to monitor the patient.     Alvy Beal, MD     DDB/MEDQ  D:  04/08/2011  T:  04/09/2011  Job:  [id number]  Electronically Signed by Venita Lick MD on 04/20/2011 12:02:46 AM

## 2011-04-22 NOTE — Consult Note (Signed)
  Tara Huff, DUCLOS               ACCOUNT NO.:  [id number]  MEDICAL RECORD NO.:  [id number]  LOCATION:  3704                         FACILITY:  [facility]  PHYSICIAN:  Tiah Heckel H. Pollyann Kennedy, MD     DATE OF BIRTH:  [birth year]  DATE OF CONSULTATION:  04/15/2011 DATE OF DISCHARGE:                                CONSULTATION   REASON FOR CONSULTATION:  Epistaxis.  HISTORY:  This is a 76-hour-old lady who was admitted to the hospital earlier this afternoon with epistaxis and osteomyelitis as well as atrial fibrillation.  She was discharged from the hospital yesterday for other reasons, but came back when she started having nosebleeds yesterday afternoon that persisted intermittently up to this morning. She said during the night it was even worse than usual.  They have all been on the right side and had been anterior and posterior in location. She was treated in the emergency department and had an anterior- posterior inflatable packing placed and has not had bleeding since then. She was admitted to the Medical Service on the monitor unit.  PAST MEDICAL HISTORY:  Significant for atrial fibrillation, but she is currently not on anticoagulation therapy, just an aspirin a day.  She also has a history of osteomyelitis and is otherwise in fairly good health.  PHYSICAL EXAMINATION:  She is a healthy-appearing lady.  She is awake and alert and able to answer questions appropriately.  Oral cavity and pharynx reveal very dry mucous membranes, but no lesions noted.  No masses and no evidence of any blood in the oropharynx.  Nasal exam reveals an inflatable anterior-posterior pack on the right side that is stable and the left side is clear.  Ears are unremarkable.  Neck exam reveals no palpable masses.  IMPRESSION:  Intractable epistaxis.  It seems to be effectively treated with packing.  Recommendation is to be the packing in for 5 days and then to remove it. That could be done in the office if she is  stable enough to be discharged to home.  If she does not bleed at all in the next 24 hours, I think it is reasonable to let her go home tomorrow, unless there are other medical concerns that would require her stay in the hospital.  I am available if she has any additional bleeding.  I will see her and treat her appropriately and may include moving the packing and trying to identify the source of bleeding and cauterize or repack.     Charniece Venturino H. Pollyann Kennedy, MD     JHR/MEDQ  D:  04/15/2011  T:  04/16/2011  Job:  [id number]  Electronically Signed by Serena Colonel MD on 04/22/2011 11:02:32 AM

## 2011-04-23 NOTE — H&P (Signed)
Tara Huff, Tara Huff               ACCOUNT NO.:  [id number]  MEDICAL RECORD NO.:  [id number]  LOCATION:  [facility]                         FACILITY:  [facility]  PHYSICIAN:  Hartley Barefoot, MD    DATE OF BIRTH:  [birth year]  DATE OF ADMISSION:  04/15/2011 DATE OF DISCHARGE:                             HISTORY & PHYSICAL   CHIEF COMPLAINT:  Nose bleed.  HISTORY OF PRESENT ILLNESS:  This is a 77 year old recently discharged from the hospital on April 14, 2011, after being diagnosed with left lower extremity cellulitis with osteomyelitis, back pain, history of AFib not on Coumadin, diabetes who presents to the Emergency Department after an episode of nose bleed.  The patient relate that she started to have some nosebleed overnight, perfused.  The morning of admission, she continued to have persistent bleed, for this reason, she presented to the Emergency Department.  When she arrived to the Emergency Department, she was found to be on AFib, RVR, her heart rate was 144 and blood pressure 108/62.  Subsequently, her heart rate decreased to 100.  On interviewing the patient, the patient denies any chest pain or shortness of breath. Bleed has been controlled after packing.  ALLERGIES:  No known drug allergies.  PAST MEDICAL HISTORY: 1. Left lower extremity cellulitis with osteomyelitis diagnosed in     June 2012. 2. Acute back pain. 3. Cardiomyopathy. 4. History of volume overload. 5. AFib. 6. Diabetes. 7. Hypertension. 8. Obstructive sleep apnea. 9. Iron deficiency anemia. 10.Folate deficiency.  HOME MEDICATIONS:  From prior discharge summary; 1. Vancomycin 1000 mg daily. 2. Zosyn. 3. Folic acid 1 tablet by mouth daily. 4. Zolpidem 5 mg daily at bedtime. 5. Amlodipine and benazepril 5/20 p.o. daily. 6. Diltiazem 180 mg p.o. daily. 7. Furosemide 40 mg 1 tablet 3 times a day. 8. Hydrocodone and Tylenol 1 tablet every 6 hours as needed. 9. Metolazone 5 mg every other day. 10.Metoprolol  50 mg half-tablet twice daily. 11.Potassium chloride 20 mEq p.o. b.i.d. 12.Pramipexole 0.25 mg half a tablet by mouth daily at bedtime. 13.Zoloft 50 mg p.o. daily.  REVIEW OF SYSTEMS:  Negative except as per HPI.  SOCIAL HISTORY:  No smoking.  She is a resident of assisting living facility.  FAMILY HISTORY:  No history of early coronary artery disease. Noncontributory.  PHYSICAL EXAMINATION:  VITAL SIGNS:  Pulse 98-106 initially in the 140, blood pressure 126/67, respirations 28 to 16, ands sat 96 on room air. GENERAL:  The patient is sitting in bed, anxious appearing in no acute distress. HEENT:  Head is atraumatic, normocephalic.  Eyes anicteric.  Pupils are equal and reactive to light.  Extraocular muscles intact. Nose, right nostril bleed status post packing.  Mouth with dry old blood.  No tonsillar enlargement or exudate.  Old blood in the tonsil. CARDIOVASCULAR:  S1, S2.  Regular rhythm and rate.  No rubs, murmurs, or gallops. LUNGS:  Bilateral good air movement.  No crackles, wheezing, or rhonchi. ABDOMEN:  Bowel sounds positive.  Soft.  Nontender, nondistended. EXTREMITIES:  Bilateral lower extremity with edema and dressing. NEUROLOGIC:  Nonfocal.  ADMISSION LABORATORY DATA:  INR 1.3.  White blood cell 9.4, hemoglobin 10, platelet 224, and hematocrit  30.  Glucose 141, sodium 137, potassium 3.8, chloride 99, bicarb 29, BUN 48, and creatinine 1.6.  ASSESSMENT AND PLAN: 1. Atrial fibrillation with rapid ventricular response.  We will admit     the patient on telemetry.  We will cycle cardiac enzymes.  We are     going to restart her home medications.  Cycle hemoglobin level.     The patient has refused Coumadin in the past.  We will hold aspirin     at this time due to nosebleed. 2. Epistaxis: The patient status post packing.  Hold aspirin.  ED already consulted ENT.  The     patient may need to follow up with ENT. 3. History of osteomyelitis, left lower extremity  cellulitis.  I will     restart her back on IV antibiotics.  She will need IV antibiotics     for 6 weeks as per prior records. 4. Acute on chronic renal insufficiency.  I will hold Lasix and     metolazone at this time.  We will repeat BMET in the morning. 5. Anxiety with p.r.n. Xanax. 6. History of diabetes.  Sliding scale insulin.  Check CBG.     Hartley Barefoot, MD     BR/MEDQ  D:  04/15/2011  T:  04/15/2011  Job:  [id number]  Electronically Signed by Hartley Barefoot MD on 04/23/2011 07:41:10 PM

## 2011-04-23 NOTE — Discharge Summary (Signed)
NAMEHAYDIN, DUNN               ACCOUNT NO.:  [id number]  MEDICAL RECORD NO.:  [id number]  LOCATION:  3704                         FACILITY:  [facility]  PHYSICIAN:  Hartley Barefoot, MD    DATE OF BIRTH:  [birth year]  DATE OF ADMISSION:  04/15/2011 DATE OF DISCHARGE:  04/18/2011                              DISCHARGE SUMMARY   DISCHARGE DIAGNOSES: 1. Atrial fibrillation with rapid ventricular response. 2. Acute blood loss secondary to epistaxis. 3. Epistaxis possible posterior nasal bleeding.  OTHER PAST MEDICAL HISTORY: 1. Left lower extremity cellulitis with osteomyelitis. 2. Cardiomyopathy. 3. Type 2 diabetes. 4. Hypertension. 5. Sleep apnea. 6. Iron deficiency anemia. 7. Folate deficiency. 8. Anxiety.  DISCHARGE MEDICATIONS: 1. Ferrous sulfate 325 mg p.o. b.i.d. 2. Lasix 40 mg p.o. b.i.d. 3. Zosyn 3.375 q.8 h. 4. Polyethylene glycol 17 g p.o. daily as needed for constipation. 5. Vancomycin 1000 mg q.24 h. 6. __________ 180 mg p.o. daily. 7. Folic acid 1 tablet by mouth daily. 8. Hydrocodone 5/325 one tablet by mouth every 6 hours as needed. 9. Metoprolol 50 mg half a tablet b.i.d. 10.Potassium chloride 20 mEq p.o. b.i.d. 11.Pramipexole 0.25 mg half to one tablet by mouth daily at bedtime. 12.Zoloft 50 mg p.o. daily. 13.Zolpidem daily at bedtime as needed for insomnia.  Medications stopped during this hospitalization:  Aspirin, amlodipine, benazepril, metolazone, sodium chloride nasal spray, oxymetazoline spray.  DISPOSITION AND FOLLOWUP:  Patient need to follow with Dr. Pollyann Kennedy on Monday to follow up epistaxis and possible removal of the plaquing.  She will need to follow up with her primary care physician to consider to restart her aspirin for stroke prevention due to her history of AFib. She will need also a BMET to follow renal function.  She will need also vancomycin level.  She will need also to follow with her primary care physician.  She might need to be  referred for a colonoscopy.  She will need a hemoglobin to follow hemoglobin level.  BRIEF HISTORY OF PRESENT ILLNESS:  This is a very pleasant 77 year old recently discharged from the hospital after being diagnosed with left lower extremity cellulitis with osteomyelitis, sent home on IV antibiotics.  The patient presented to the emergency department complaining of profuse nosebleed.  The bleed started overnight.  The patient was found to be in the emergency department in AFib RVR, heart rate in the 144, blood pressure 108/62.  HOSPITAL COURSE: 1. AFib with RVR in the setting of acute bleed.  The patient was     admitted to telemetry.  Cardiac enzymes were negative.  Her home     medication will be started.  Aspirin has been on hold due to severe     bleed and dropping in hemoglobin.  She will need to follow up with     her primary care physician to make sure that her hemoglobin is     stable and she does not have any more epistaxis prior to restart     aspirin. 2. Intractable epistaxis.  The patient presented with profuse bleed.     She had packing done in the emergency department.  ENT was     consulted.  They recommend to  follow with them as an outpatient on     Monday with Dr. Pollyann Kennedy.  The patient's hemoglobin decreased from 10     to 8 and then to 9.  Hemoglobin has remained stable in 9 range.     Aspirin has been on hold.  Aspirin will need to be restarted after     she follows with her ENT and hemoglobin to be rechecked. 3. History of osteomyelitis.  We will continue with IV antibiotics.     She will need a BMET and a vancomycin level.  We still would need     to be called to her primary care physician.  She will need to     follow up with her orthopedic doctor and the wound care center. 4. Renal insufficiency. Mild worsening kidney function on admission.  Her     creatinine was 1.6.  Her Lasix was initially on hold, and her     metolazone was discontinued.  Her Lasix dose  subsequently was     restarted to lower dose 40 mg p.o. b.i.d.  No signs of fluid     overload.  The patient's volume status is euvolemic.  I will     continue with Lasix 40 mg p.o. b.i.d.  We will need to monitor her     weight and consider increase Lasix as needed.  We can also start     metolazone as needed.  I will hold ACE in the setting of acute     renal insufficiency.  This will need to be restarted by her primary     care physician. 5. Anemia, acute blood loss secondary to epistaxis.  The patient was     started on ferrous sulfate.  She had a guaiac stool that was     positive.  Her hemoglobin dropped from 10 to 8.5 and then increased     to 9.  She will need to be referred also for a screening     colonoscopy. 6. On the day of discharge, the patient was in improved condition.     Blood pressure 104/65, sats 94 on room air, respirations 20, pulse     82, and temperature 98.3.  Hemoglobin 9.1, sodium 136, potassium     3.5, chloride 103, bicarb 26, BUN 25, creatinine 1.1, glucose 120.     Hartley Barefoot, MD     BR/MEDQ  D:  04/18/2011  T:  04/19/2011  Job:  [id number]  Electronically Signed by Hartley Barefoot MD on 04/23/2011 07:45:16 PM

## 2011-04-30 ENCOUNTER — Encounter (HOSPITAL_BASED_OUTPATIENT_CLINIC_OR_DEPARTMENT_OTHER): Payer: Medicare Other | Attending: Internal Medicine

## 2011-04-30 DIAGNOSIS — I4891 Unspecified atrial fibrillation: Secondary | ICD-10-CM | POA: Insufficient documentation

## 2011-04-30 DIAGNOSIS — L97809 Non-pressure chronic ulcer of other part of unspecified lower leg with unspecified severity: Secondary | ICD-10-CM | POA: Insufficient documentation

## 2011-04-30 DIAGNOSIS — E119 Type 2 diabetes mellitus without complications: Secondary | ICD-10-CM | POA: Insufficient documentation

## 2011-04-30 DIAGNOSIS — L03119 Cellulitis of unspecified part of limb: Secondary | ICD-10-CM | POA: Insufficient documentation

## 2011-04-30 DIAGNOSIS — L02419 Cutaneous abscess of limb, unspecified: Secondary | ICD-10-CM | POA: Insufficient documentation

## 2011-04-30 DIAGNOSIS — G4730 Sleep apnea, unspecified: Secondary | ICD-10-CM | POA: Insufficient documentation

## 2011-04-30 DIAGNOSIS — M869 Osteomyelitis, unspecified: Secondary | ICD-10-CM | POA: Insufficient documentation

## 2011-04-30 DIAGNOSIS — Z79899 Other long term (current) drug therapy: Secondary | ICD-10-CM | POA: Insufficient documentation

## 2011-04-30 DIAGNOSIS — I872 Venous insufficiency (chronic) (peripheral): Secondary | ICD-10-CM | POA: Insufficient documentation

## 2011-04-30 DIAGNOSIS — I10 Essential (primary) hypertension: Secondary | ICD-10-CM | POA: Insufficient documentation

## 2011-04-30 DIAGNOSIS — S78119A Complete traumatic amputation at level between unspecified hip and knee, initial encounter: Secondary | ICD-10-CM | POA: Insufficient documentation

## 2011-04-30 DIAGNOSIS — I890 Lymphedema, not elsewhere classified: Secondary | ICD-10-CM | POA: Insufficient documentation

## 2011-05-14 ENCOUNTER — Encounter (HOSPITAL_BASED_OUTPATIENT_CLINIC_OR_DEPARTMENT_OTHER): Payer: Medicare Other | Attending: Internal Medicine

## 2011-05-14 DIAGNOSIS — Z79899 Other long term (current) drug therapy: Secondary | ICD-10-CM | POA: Insufficient documentation

## 2011-05-14 DIAGNOSIS — I872 Venous insufficiency (chronic) (peripheral): Secondary | ICD-10-CM | POA: Insufficient documentation

## 2011-05-14 DIAGNOSIS — M869 Osteomyelitis, unspecified: Secondary | ICD-10-CM | POA: Insufficient documentation

## 2011-05-14 DIAGNOSIS — L02419 Cutaneous abscess of limb, unspecified: Secondary | ICD-10-CM | POA: Insufficient documentation

## 2011-05-14 DIAGNOSIS — I10 Essential (primary) hypertension: Secondary | ICD-10-CM | POA: Insufficient documentation

## 2011-05-14 DIAGNOSIS — L97809 Non-pressure chronic ulcer of other part of unspecified lower leg with unspecified severity: Secondary | ICD-10-CM | POA: Insufficient documentation

## 2011-05-14 DIAGNOSIS — I890 Lymphedema, not elsewhere classified: Secondary | ICD-10-CM | POA: Insufficient documentation

## 2011-05-14 DIAGNOSIS — I4891 Unspecified atrial fibrillation: Secondary | ICD-10-CM | POA: Insufficient documentation

## 2011-05-14 DIAGNOSIS — G4730 Sleep apnea, unspecified: Secondary | ICD-10-CM | POA: Insufficient documentation

## 2011-05-14 DIAGNOSIS — L03119 Cellulitis of unspecified part of limb: Secondary | ICD-10-CM | POA: Insufficient documentation

## 2011-05-14 DIAGNOSIS — E119 Type 2 diabetes mellitus without complications: Secondary | ICD-10-CM | POA: Insufficient documentation

## 2011-05-14 DIAGNOSIS — S78119A Complete traumatic amputation at level between unspecified hip and knee, initial encounter: Secondary | ICD-10-CM | POA: Insufficient documentation

## 2011-05-21 NOTE — Progress Notes (Unsigned)
[facility]  NAME:  CATRINIA, RACICOT                    ACCOUNT NO.:  MEDICAL RECORD NO.:  [id number]      DATE OF BIRTH:  1934-03-16  PHYSICIAN:  Maxwell Caul, M.D. VISIT DATE:  04/02/2011                                  OFFICE VISIT   Ms. Fort is a type 2 diabetic patient with severe venous stasis.  She actually presented to this clinic earlier this year with a wound over her third plantar metatarsal head.  An MRI suggested osteomyelitis and she was treated with antibiotics, HBO, and the wound actually healed over.  During this time, we transitioned to a problem with her left fourth toe and indeed she had osteomyelitis under this as well and is still on doxycycline.  I last saw her a month ago, at which time we were giving her HBO for the fourth toe osteomyelitis.  She had developed a callus over the original left third metatarsal head and the facility had off-loaded this with foam.  The patient states that she went home after her last HBO treatment at the beginning of May, spent a prolonged period time on the floor.  She has had several falls since then.  She arrives actually with same foam dressing over her third metatarsal head that was put on over 3 weeks ago.  Exam showed a now clear open area over the left third metatarsal head. This was debrided.  There was overhang here which was also removed.  The base of the wound was debrided of adherent slough and the base of the wound was cultured.  She also has an open area dorsally in the webspace between her first and second toe involving the base of the second toe dorsally.  This was also debrided.  The offending fourth toe does not have an open area; however, it appears swollen.  There may be some infection/cellulitis on the dorsal left foot, although because of her severe venous skin changes it is hard to really tell.  IMPRESSION:  New wound on the left third metatarsal head.  This is worrisome as  she has had documented osteomyelitis in this area earlier this year.  I have added Levaquin to her doxycycline.  We applied silver collagen to this area and to the dorsal second toe-first toe interface. I have reordered an MRI of the entire foot to follow up on the areas of osteomyelitis in question.  I think this lady is heading for a prolonged course of IV antibiotics.  However, for now she is on doxycycline and Levaquin.  A culture has been done of the third plantar metatarsal head. She has already stated she will not consider reattempt at HBO.  We will see her again in a week.          ______________________________ Maxwell Caul, M.D.     MGR/MEDQ  D:  04/02/2011  T:  04/03/2011  Job:  [id number]

## 2011-05-25 NOTE — Consult Note (Signed)
Tara Huff, Tara Huff NO.:  [id number]  MEDICAL RECORD NO.:  [id number]  LOCATION:                                 FACILITY:  PHYSICIAN:  Leonides Grills, M.D.     DATE OF BIRTH:  1934/03/15  DATE OF CONSULTATION: DATE OF DISCHARGE:                                CONSULTATION   HISTORY:  This is a 77 year old female with extensive past medical history which includes sleep apnea, morbid obesity, reported moderate to severe pulmonary hypertension, cor pulmonale, atrial fib not on Coumadin per the patient's preference, lower extremity venous insufficiency with chronic lymphedema, hypertension, diabetes, and history of left third toe amputation due to osteomyelitis.  I was asked to see the patient by Dr. Shon Baton in consultation for pain of her left foot.  The patient reports that she has had chronic ulcerations to left foot that has been treated for years.  It is weaning to heal with a new ulcer to form. Approximately 5 days ago, she noticed increased redness and swelling of left lower leg with ulceration of the left foot.  She is sent for an MRI of the left foot which was obtained.  The patient admitted this hospitalization due to back pain, fluid overload with lower extremity edema, and left leg cellulitis.  PAST MEDICAL HISTORY: 1. Sleep apnea without CPAP. 2. Hypertension. 3. Diabetes mellitus. 4. Pulmonary hypertension. 5. Cor pulmonale. 6. Lower extremity chronic venous stasis ulcers. 7. Lymphedema. 8. History of left third toe amputation secondary to osteomyelitis.  PHYSICAL EXAMINATION:  VITAL SIGNS:  Temperature 98.6, pulse 74, respirations 17, blood pressure 115/66, O2 saturation 99% on room air, weight 124.937 kg. GENERAL:  A well-developed, well-nourished female in no acute distress. PSYCH:  The patient is alert and oriented x3. RESPIRATORY:  Respirations unlabored.  Diaphragm rises and falls symmetrically. LOWER EXTREMITIES:  Bilateral lower  extremities with 2+ pitting edema bilaterally with the chronic skin changes secondary to lymphedema. Warmth of the left lower leg with cellulitis appearance. SKIN:  Again, left leg with cellulitis changes.  Bilateral lower extremity edema.  Approximately quarter-sized ulceration in the left third metatarsal head region without purulence.  Abrasion of left medial dorsal foot.  Left foot without malodor.  Right foot, no skin breakdown, no impending ulcers. VASCULAR:  Left dorsal pedal pulse on left is +1 and dorsal pedal pulse 2+ on the right. NEURO:  Sensation grossly intact L4-S1.  MRI of left foot dated on Apr 07, 2011, showed improved osteomyelitis changes of second digit, worsen edema about the fourth and fifth metatarsal heads, questionable osteomyelitis.  Questionable osteomyelitis involving the first metatarsal head and proximal phalanx great toe, edema of lateral sesamoid , limited study secondary to the patient's refusal for peripheral imaging studies.  Left fourth toe radiographs dated Mar 24, 2011, showed osteomyelitis involving the fourth phalanx.  The distal phalanx at proximal and middle phalanges appeared normal, no acute fractures.  MRI of the left lower leg without contrast dated Apr 07, 2011, shows subcutaneous edema in both lower leg, left greater than right, no definite abscess, no suggestions of osteomyelitis or myositis.  CBC on admission:  White count 8800, hemoglobin 11.5, hematocrit  33.6, and platelets 153,000.  CBC on Apr 08, 2011, showed white count of 4400, hemoglobin 11.9, hematocrit 35.2, and platelets 167.  Sed rate on admission was 40 mm/hour.  Sed rate on Apr 08, 2011, 48 mm/hour.  ASSESSMENT/PLAN:  This is a 77 year old female with extensive past medical history as outlined above, now with acute onset of cellulitis with lower leg with osteomyelitis of the left foot involving the second digit and probably involving the fourth and fifth metatarsal  heads, questionable osteomyelitis of first metatarsal head and proximal phalanx of great toe.  PLAN:  We will review MRI with Dr. Lestine Box, continue current care with wound care.  Continue Zosyn and vancomycin.  We will consider plain radiographs of the left foot for better evaluation of osteomyelitis involvement secondary to limited MRI study.     Richardean Canal, P.A.   ______________________________ Leonides Grills, M.D.    GC/MEDQ  D:  04/09/2011  T:  04/10/2011  Job:  [id number]  cc:   Leonides Grills, M.D.  Electronically Signed by Richardean Canal P.A. on 04/20/2011 01:45:15 PM Electronically Signed by Leonides Grills M.D. on 05/25/2011 05:53:27 PM

## 2011-05-25 NOTE — Consult Note (Signed)
  NAMEAMBREEN, TUFTE NO.:  [id number]  MEDICAL RECORD NO.:  [id number]  LOCATION:  2004                         FACILITY:  [facility]  PHYSICIAN:  Leonides Grills, M.D.     DATE OF BIRTH:  1934/04/25  DATE OF CONSULTATION: DATE OF DISCHARGE:                                CONSULTATION   ADDENDUM ON WORK #[id number].  After discussion this patient with Dr. Lestine Box.  At this time, the patient been afebrile and no signs of sepsis and the fact that the patient is train towards to be getting better on IV antibiotics.  We recommend continued care simply with IV antibiotics.  The patient can in [place] shoes.  Weightbearing as tolerated on the left.  Have the patient follow up with Dr. Lestine Box in the office 1 week after discharge to call [phone] for appointment.  Please Primary Team to contact Dr. Lestine Box and myself if further consultation needed during this hospitalization and please call [phone] for consultation.Richardean Canal, P.A.   ______________________________ Leonides Grills, M.D.    GC/MEDQ  D:  04/09/2011  T:  04/10/2011  Job:  [id number]  cc:   Leonides Grills, M.D.  Electronically Signed by Richardean Canal P.A. on 04/20/2011 01:45:07 PM Electronically Signed by Leonides Grills M.D. on 05/25/2011 05:53:31 PM

## 2011-05-27 ENCOUNTER — Encounter (HOSPITAL_COMMUNITY): Payer: Self-pay | Admitting: Radiology

## 2011-05-27 ENCOUNTER — Emergency Department (HOSPITAL_COMMUNITY): Payer: Medicare Other

## 2011-05-27 ENCOUNTER — Inpatient Hospital Stay (HOSPITAL_COMMUNITY)
Admission: EM | Admit: 2011-05-27 | Discharge: 2011-06-02 | DRG: 291 | Disposition: A | Discharge status: Skilled Nursing Facility | Payer: Medicare Other | Source: Ambulatory Visit | Attending: Internal Medicine | Admitting: Internal Medicine

## 2011-05-27 DIAGNOSIS — I4891 Unspecified atrial fibrillation: Secondary | ICD-10-CM | POA: Diagnosis present

## 2011-05-27 DIAGNOSIS — I5033 Acute on chronic diastolic (congestive) heart failure: Principal | ICD-10-CM | POA: Diagnosis present

## 2011-05-27 DIAGNOSIS — I2609 Other pulmonary embolism with acute cor pulmonale: Secondary | ICD-10-CM | POA: Diagnosis present

## 2011-05-27 DIAGNOSIS — M869 Osteomyelitis, unspecified: Secondary | ICD-10-CM | POA: Diagnosis present

## 2011-05-27 DIAGNOSIS — I279 Pulmonary heart disease, unspecified: Secondary | ICD-10-CM | POA: Diagnosis present

## 2011-05-27 DIAGNOSIS — N189 Chronic kidney disease, unspecified: Secondary | ICD-10-CM | POA: Diagnosis present

## 2011-05-27 DIAGNOSIS — N179 Acute kidney failure, unspecified: Secondary | ICD-10-CM | POA: Diagnosis present

## 2011-05-27 DIAGNOSIS — R5381 Other malaise: Secondary | ICD-10-CM | POA: Diagnosis present

## 2011-05-27 DIAGNOSIS — L97509 Non-pressure chronic ulcer of other part of unspecified foot with unspecified severity: Secondary | ICD-10-CM | POA: Diagnosis present

## 2011-05-27 DIAGNOSIS — E1169 Type 2 diabetes mellitus with other specified complication: Secondary | ICD-10-CM | POA: Diagnosis present

## 2011-05-27 DIAGNOSIS — I2789 Other specified pulmonary heart diseases: Secondary | ICD-10-CM | POA: Diagnosis present

## 2011-05-27 DIAGNOSIS — Z794 Long term (current) use of insulin: Secondary | ICD-10-CM

## 2011-05-27 DIAGNOSIS — E876 Hypokalemia: Secondary | ICD-10-CM | POA: Diagnosis present

## 2011-05-27 DIAGNOSIS — I509 Heart failure, unspecified: Secondary | ICD-10-CM | POA: Diagnosis present

## 2011-05-27 DIAGNOSIS — I129 Hypertensive chronic kidney disease with stage 1 through stage 4 chronic kidney disease, or unspecified chronic kidney disease: Secondary | ICD-10-CM | POA: Diagnosis present

## 2011-05-27 HISTORY — DX: Essential (primary) hypertension: I10

## 2011-05-27 LAB — CBC
Hemoglobin: 11.5 g/dL — ABNORMAL LOW (ref 12.0–15.0)
MCHC: 33.2 g/dL (ref 30.0–36.0)
RDW: 16.7 % — ABNORMAL HIGH (ref 11.5–15.5)
WBC: 6 10*3/uL (ref 4.0–10.5)

## 2011-05-27 LAB — URINE MICROSCOPIC-ADD ON

## 2011-05-27 LAB — COMPREHENSIVE METABOLIC PANEL
CO2: 22 mEq/L (ref 19–32)
Calcium: 8.6 mg/dL (ref 8.4–10.5)
Creatinine, Ser: 0.95 mg/dL (ref 0.50–1.10)
GFR calc Af Amer: >60 mL/min (ref >60)
GFR calc non Af Amer: 58 mL/min — ABNORMAL LOW (ref >60)
Glucose, Bld: 116 mg/dL — ABNORMAL HIGH (ref 70–99)

## 2011-05-27 LAB — URINALYSIS, ROUTINE W REFLEX MICROSCOPIC
Nitrite: NEGATIVE
Specific Gravity, Urine: 1.035 — ABNORMAL HIGH (ref 1.005–1.030)
Urobilinogen, UA: 1 mg/dL (ref 0.0–1.0)

## 2011-05-27 LAB — DIFFERENTIAL
Basophils Absolute: 0.1 10*3/uL (ref 0.0–0.1)
Basophils Relative: 1 % (ref 0–1)
Monocytes Absolute: 0.8 10*3/uL (ref 0.1–1.0)
Neutro Abs: 4 10*3/uL (ref 1.7–7.7)

## 2011-05-27 LAB — POCT I-STAT, CHEM 8
Creatinine, Ser: 1.1 mg/dL (ref 0.50–1.10)
HCT: 37 % (ref 36.0–46.0)
Hemoglobin: 12.6 g/dL (ref 12.0–15.0)
Sodium: 141 mEq/L (ref 135–145)
TCO2: 19 mmol/L (ref 0–100)

## 2011-05-27 LAB — CK TOTAL AND CKMB (NOT AT ARMC)
CK, MB: 3.5 ng/mL (ref 0.3–4.0)
Total CK: 77 U/L (ref 7–177)

## 2011-05-27 LAB — PRO B NATRIURETIC PEPTIDE: Pro B Natriuretic peptide (BNP): 1707 pg/mL — ABNORMAL HIGH (ref 0–125)

## 2011-05-27 MED ORDER — IOHEXOL 350 MG/ML SOLN
100.0000 mL | Freq: Once | INTRAVENOUS | Status: AC | PRN
  Administered 2011-05-27: 100 mL via INTRAVENOUS

## 2011-05-28 LAB — GLUCOSE, CAPILLARY
Glucose-Capillary: 103 mg/dL — ABNORMAL HIGH (ref 70–99)
Glucose-Capillary: 143 mg/dL — ABNORMAL HIGH (ref 70–99)
Glucose-Capillary: 144 mg/dL — ABNORMAL HIGH (ref 70–99)
Glucose-Capillary: 174 mg/dL — ABNORMAL HIGH (ref 70–99)

## 2011-05-28 LAB — BASIC METABOLIC PANEL
CO2: 23 mEq/L (ref 19–32)
Chloride: 108 mEq/L (ref 96–112)
Creatinine, Ser: 0.97 mg/dL (ref 0.50–1.10)

## 2011-05-28 LAB — CARDIAC PANEL(CRET KIN+CKTOT+MB+TROPI)
Relative Index: INVALID (ref 0.0–2.5)
Relative Index: INVALID (ref 0.0–2.5)

## 2011-05-28 NOTE — Discharge Summary (Signed)
Tara Huff, Tara Huff NO.:  [id number]  MEDICAL RECORD NO.:  [id number]  LOCATION:  4712                         FACILITY:  [facility]  PHYSICIAN:  Calvert Cantor, M.D.     DATE OF BIRTH:  [birth year]  DATE OF ADMISSION:  04/04/2011 DATE OF DISCHARGE:  04/14/2011                              DISCHARGE SUMMARY   PRIMARY CARE PHYSICIAN:  Dario Guardian, MD  CARDIOLOGIST:  Jake Bathe, MD  ORTHOPEDIC SURGEON:  Leonides Grills, MD  The patient was not discharged on April 10, 2011, as planned.  The patient started to develop chills and subsequently developed a fever. Temperature went up to 100.3.  The patient has atrial fibrillation, but became quite tachycardic with a heart rate up to the 130s.  At this point, she was transferred to the Step-Down Unit.  Heart rate was controlled with IV medication.  Fever was controlled with Tylenol.  I had switched her over to imipenem a day prior in hopes of getting her on a use your home antibiotic regimen.  She was previously on Zosyn.  We did not have blood cultures to assist [place] with narrowing down her antibiotics.  The reason for the fever may have been the switch from Zosyn to imipenem.  At this point, the patient was switched back to Zosyn and fevers have resolved.  We did draw blood cultures at that time which are negative.  We checked a UA as well which was negative.  She did not appear to have any signs of pneumonia.  Chest x-ray was done on April 10, 2011, which revealed some mild pulmonary edema.  As mentioned above, she is stable back on Zosyn, therefore I am currently going to keep her on Zosyn at discharge.  Unfortunately, her family will need to be infusing this 4 times a day.  I am currently giving her a prescription for 2 weeks, but I expect that she will require 4-6 weeks for her osteomyelitis.  She does need to follow up with Dr. Lestine Box and antibiotic should be continued by Dr. Lestine Box for a total of 6 weeks unless  he plans on doing surgery/amputation, at which point antibiotics course can be shortened.  In regards to her AFib, it is now back under control.  She did develop some pulmonary edema and required some aggressive diuresis.  Today, lungs are clear and oxygen level is 91-92% on room air at rest and after exertion.  Her weight has come down to 112 kg.  She weighed 144 kg when she arrived.  Most of her fluid weighed was secondary to right heart failure from severe tricuspid regurgitation.  PHYSICAL EXAMINATION:  LUNGS:  On discharge today, lungs are clear bilaterally with good respiratory effort. HEART:  Irregular rate and rhythm.  No murmurs. ABDOMEN:  Soft, obese, nontender, nondistended.  Bowel sounds positive. EXTREMITIES:  She continues to have chronic skin changes associated with severe edema, but edema is much improved from admission.  FOLLOWUP INSTRUCTIONS: 1. She needs to follow up with Dr. Lestine Box in 1 week. 2. Follow up with Dr. Merri Brunette, in 1-2 weeks.  The patient is being discharged with home health RN, who will  assist in heart failure management and watching fluid gain.  She is also being discharged with home health PT and OT.  As noted, it should be decided by Dr. Lestine Box on continuing her antibiotics for a total of 6-week course.  DISCHARGE DIET:  Low-sodium, low-fat, diabetic diet.  CONDITION ON DISCHARGE:  Stable.     Calvert Cantor, M.D.     SR/MEDQ  D:  04/14/2011  T:  04/14/2011  Job:  [id number]  cc:   Dario Guardian, M.D. Leonides Grills, M.D.  Electronically Signed by Calvert Cantor M.D. on 05/28/2011 12:06:56 PM

## 2011-05-28 NOTE — H&P (Signed)
NAMELYANN, HAGSTROM NO.:  [id number]  MEDICAL RECORD NO.:  [id number]  LOCATION:  4729                         FACILITY:  [facility]  PHYSICIAN:  Candelaria Celeste, DO      DATE OF BIRTH:  01-12-1934  DATE OF ADMISSION:  05/27/2011 DATE OF DISCHARGE:                             HISTORY & PHYSICAL   PRIMARY CARE PROVIDER:  Dario Guardian, MD  PRIMARY INFECTIONS DISEASE DOCTOR:  Dr. Lestine Box.  PRIMARY CARDIOLOGIST:  Armanda Magic, MD.  PRIMARY WOUND CARE SPECIALIST:  Dr. Roxan Hockey.  CHIEF COMPLAINT:  Shortness of breath.  HISTORY OF PRESENT ILLNESS:  Ms. Medero is a 77 year old Caucasian female with diabetes, peripheral edema including extreme venous stasis diabetic wound ulcer, for which, she is being treated with antibiotics, atrial fibrillation, who presents with 5 days of worsening shortness of breath.  She states that this morning, she was orthopneic and unable to lie down.  She describes dyspnea on exertion with walking across the room, though she is able to carry on conversation without becoming dyspneic.  She states that this happened gradually over the past 5 days. Denies fevers, chills, chest pain, nausea, vomiting, diarrhea.  She does describe a dry cough, nonproductive, but denies wheezing or pleurisy. She has not changed medications.  Sitting up eases the dyspnea, but she has been unable to find other things that improves the dyspnea.  PAST MEDICAL HISTORY:  Significant for: 1. Depression. 2. Diabetes type 2. 3. Hypertension. 4. Atrial fibrillation. 5. Cervical cancer status post hysterectomy including the cervix     status post radiation. 6. Obstructive sleep apnea. 7. History of iron deficiency anemia. 8. Left lower extremity cellulitis with osteomyelitis diagnosed in     June 2012.  PAST SURGICAL HISTORY:  Significant for: 1. Cataracts bilaterally. 2. Hysterectomy with removal of cervix. 3. Tonsillectomy and  adenoidectomy.  MEDICATIONS: 1. Ferrous sulfate 325 mg b.i.d. 2. Diltiazem extended release 180 mg daily. 3. Sertraline 50 mg daily. 4. Klor-Con 20 mEq b.i.d. 5. Vicodin 5/325 one tablet every 6-8 hours as needed for pain. 6. Vitamin D 400 International Units daily. 7. Lasix 40 mg p.o. t.i.d. 8. Alprazolam 0.25 mg b.i.d. 9. Metoprolol 50 mg half a tablet twice daily. 10.Lantus 20 units daily.  FAMILY HISTORY:  The patient describes history of emphysema in her father, history of bone cancer in her maternal grandmother, and liver and kidney cancer in her mother.  SOCIAL HISTORY:  She lives with her husband and son and is a retired Financial controller for [employer].  She describes a 15-year pack year history of smoking, describes social alcohol use and illicit drug use.  REVIEW OF SYSTEMS:  A full 10 system review of systems was preformed and is negative other than as stated in the HPI.  PHYSICAL EXAMINATION:  VITAL SIGNS:  Temperature is 97.7, heart rate is 91, blood pressure 121/74, respiratory rate is 26, oxygen saturation 98% on 2 L nasal cannula. GENERAL:  This is a morbidly obese, elderly Caucasian female who is awake, alert, and oriented x3 and mildly dyspneic. HEENT:  Head; normocephalic, atraumatic.  Pupils are equal, round, reactive to light.  Extraocular muscles are intact.  Sclerae are anicteric and noninjected.  Tympanic membranes are translucent with good cone of light.  Examination of the posterior oropharynx is nonerythematous.  Mucosal membranes are moist. NECK:  Supple without lymphadenopathy.  JVD is approximately 8 cm. LUNGS:  Rales bilaterally, left greater than right with poor excursion and mild wheezing. ABDOMEN:  Obese, nontender. EXTREMITIES:  2+ dorsalis pedis radial pulses.  The patient does have extreme venous stasis with chronic changes to the skin including severe keratosis of the lower extremities bilaterally.  She also has extreme onychomycosis of all  of her toenails.  There is a 1.5-cm ulcer on the plantar surface of her left foot that is approximately stage II ulcer. NEUROLOGIC:  The patient is able to move all 4 extremities without deficit.  Cranial nerves II through XII are grossly intact.  LABORATORY DATA: 1. A CBC was drawn which was normal. 2. A pro-BNP was drawn which was 1707. 3. Cardiac panel was drawn which showed troponin I of less than 0.30,     CK of 77, and CK-MB of 3.5. 4. An EKG was drawn which showed normal intervals and irregular rates. 5. A portable chest x-ray was done which showed low lung volume, but     no acute changes or infiltrates. 6. CT chest was also performed which showed no evidence of pulmonary     embolism, but did show cardiomegaly with bilateral pleural     effusions and interstitial edema.  IMPRESSION AND PLAN: 1. Congestive heart failure exacerbation.  We will admit the patient     to telemetry and give the patient IV Lasix 40 mg t.i.d. and watch     for fluid excretion.  We will also fluid restrict the patient and     obtain an echocardiogram in the morning. 2. Diabetic foot ulcer.  The patient was unable to recall the     antibiotics that she is currently on.  We will follow up with her     infectious disease doctor to adequately cover the patient with     antibiotics during her hospital stay.  We will also consult Wound     Care for management of the ulcer. 3. Diabetes type 2.  We will continue the patient on Lantus and     provide sliding scale.  We will check the patient's blood glucose     before meals and at bedtime. 4. Hypertension.  We will continue the patient's antihypertensives. 5. Code status.  The patient is full code. 6. Deep venous thrombosis prophylaxis.  We will start the patient on     rest of her DVT prophylaxis.          ______________________________ Candelaria Celeste, DO     JS/MEDQ  D:  05/27/2011  T:  05/28/2011  Job:  [id number]  cc:   Dario Guardian,  M.D.  Electronically Signed by Candelaria Celeste DO on 05/28/2011 09:38:03 PM

## 2011-05-28 NOTE — Discharge Summary (Signed)
NAMEGENICE, KIMBERLIN NO.:  [id number]  MEDICAL RECORD NO.:  [id number]           PATIENT TYPE:  I  LOCATION:  2004                         FACILITY:  [facility]  PHYSICIAN:  Calvert Cantor, M.D.     DATE OF BIRTH:  [birth year]  DATE OF ADMISSION:  04/04/2011 DATE OF DISCHARGE:  04/10/2011                              DISCHARGE SUMMARY   PRIMARY CARE PHYSICIAN:  Dario Guardian, MD  CARDIOLOGIST:  Jake Bathe, MD  ORTHOPEDIC SURGEON:  Leonides Grills, MD  DISCHARGE DIAGNOSES: 1. Left lower extremity cellulitis with osteomyelitis. 2. Acute back pain. 3. Valvular cardiomyopathy. 4. Volume overload secondary to valvular cardiomyopathy. 5. Atrial fibrillation not on Coumadin. 6. Type 1 diabetes well controlled. 7. Hypertension. 8. Obstructive sleep apnea refuses CPAP. 9. Iron-deficiency anemia. 10.Folate deficiency.  PROCEDURES:  The patient had a peripherally inserted central catheter placed on April 10, 2011.  CONSULTATIONS: 1. The patient was seen by Dr. Donato Schultz on Apr 08, 2011. 2. The patient was seen by Dr. Venita Lick on Apr 08, 2011.  HISTORY AND BRIEF HOSPITAL COURSE:  Ms. Lucchetti is a 77 year old woman with a past medical history that includes a severe pulmonary hypertension and valvular cardiomyopathy, atrial fibrillation, lower extremity venous insufficiency with chronic lymphedema, hypertension, diabetes, previous toe amputation for osteomyelitis.  She presented to the emergency department on Apr 04, 2011, with acute back pain after rolling out of bed and following to the floor.  In the emergency department, she was noted to have bilateral lower extremity cellulitis and also severe fluid volume overload.  She was admitted to the Hospitalist Service for further evaluation and treatment with regards to her left lower extremity cellulitis with osteomyelitis.  The patient received an MRI of her left foot with contrast results were as follows. 1.  Improved osteomyelitis in second digit. 2. Worsened osteoedema in heads of the fourth and fifth metatarsals     suggesting osteomyelitis. 3. Low-level edema in the head of the first metatarsal with moderate     edema in the proximal phalanx of the great toe suspicious for     osteomyelitis.  Also edema in the lateral sesamoid. 4. Portion subcutaneous muscular edema, possibly manifestation of     cellulitis and myositis. 5. The patient refused further imaging.  Also, the patient had an MRI     of her tibia fibula left lower extremity results showed     subcutaneous edema in both lower legs, left greater than right     potentially representing cellulitis, no definite drainable abscess.     Finally, she had an MRI of her spine without contrast that was a     limited exam secondary to motion degradation.  Results were as     follows.  Last fully open disk space L5-S1.  No cord compression     noted.  L2 superior endplate Schmorl with no  deformity/mild     compression of questionable age, degenerative changes throughout     lumbar spine, abnormal bone marrow signal intensity may be related     to the patient's body habitus, prominence of  vascular strictures     appear to be venous maybe related to right-sided heart pressure     findings incompletely assessed on present exam.  The patient was seen in consultation initially by Dr. Venita Lick who referred the patient to his partner, Dr. Lestine Box who is a foot and ankle specialist.  Surgery was considered for the osteomyelitis in her left toes.  However, after further consideration with Dr. Lestine Box, and Richardean Canal, the PA it was decided that continued  treatment with IV antibiotics should be the first course of action and the patient is to follow up with Dr. Lestine Box on Tuesday, April 14, 2011, in his office at 9 a.m.  The patient was treated in the hospital with vancomycin and Zosyn IV for 6 days this morning.  Just before discharge, she is  being changed to IV vancomycin and imipenem.  Imipenem is preferable to Zosyn in the home environment as it is infused for 30 minutes three times a day as part of the post Zosyn which is infused for 4 hours 4 times a today. The patient's cellulitis appears to have decreased during her hospitalization as her legs have become less red in color.  Back pain.  The patient initially complained of shooting pain in her lower back that traveled down her legs with movement.  This is improved during the course of hospitalization.  The patient has been directed to follow Physical Therapy recommendations at home to continue improvement with her back pain.  She will also be discharged with small amount of hydrocodone to take as needed.  Valvular cardiomyopathy with severe volume overload.  The patient has a history of recurrent volume overload as she admits to continuing to eat large amounts of salt in her diet and was counseled against it.  Her initial weight on admission was 318 pounds after diuresis.  Her weight on discharge is 275 pounds.  She will be discharged on Lasix 40 mg p.o. t.i.d.  We will obtain a final BMET before she leaves the hospital this morning to recheck her sodium and potassium.  We have asked the patient to follow up in the outpatient setting with her primary care physician in 2-3 weeks again to monitor her volume status and her electrolytes. The rest of the patient's diagnoses including atrial fibrillation, diabetes, hypertension, obstructive sleep apnea remained quiet.  During this admission, she was simply treated with her home medications.  She was found to be folate deficient and was placed on folate supplementation.  DISCHARGE MEDICATIONS: 1. Folic acid 1 mg 1 tablet by mouth daily. 2. Imipenem/Cilastatin 500 mg 200 mL an hour intravenous every 8     hours. 3. Vancomycin 1000 mg daily infuse at 250 mL an hour once a day. 4. Zolpidem 5 mg by mouth daily at bedtime for  20 days. 5. Amlodipine/benazepril 5/20 one capsule by mouth daily. 6. Diltiazem CD XT 180 one capsules by mouth daily. 7. Furosemide 40 mg 1 tablet by mouth three times a day. 8. Hydrocodone/acetaminophen 5/325 one tablet by mouth every 6 hours     as needed for pain.  She will be given a prescription for 5 days. 9. Metolazone 5 mg 1 tablet by mouth every other day. 10.Metoprolol 50 mg half tablet by mouth twice daily. 11.Potassium chloride 20 mEq 1 tablet by mouth twice daily. 12.Pramipexole 0.25 mg half tablet by mouth daily at bedtime. 13.Zoloft 50 mg 1 tablet by mouth daily. 14.The patient is to stop taking levofloxacin 500 mg and  doxycycline     100 mg. 15.She is also to continue on her aspirin 325 mg one daily.  LABORATORY DATA:  CBC 4.4, hemoglobin 11.9, hematocrit 35.2, platelets 167.  ESR is 48.  Glucose her CBGs ranged between approximately 90 and 140 during her hospitalization.  BMET Apr 08, 2011, sodium 136, potassium 3.8, chloride 99, bicarb 27, glucose 87, BUN 28, creatinine 1.3, calcium 8.7.  BNP on Apr 08, 2011, was 3112.  Anemia panel iron 21, TIBC 201, percent saturation 10, UIBC 180, vitamin B12 598, serum folate 3.1, serum ferritin 242, hemoglobin A1c was 8.4.  PHYSICAL EXAMINATION AT THE TIME OF DISCHARGE:  GENERAL:  The patient is alert and oriented, lying in bed.  She is cheerful.  She is looking forward to discharge today. VITAL SIGNS:  Temperature 98.0, pulse 72, respirations 18, blood pressure 107/62, O2 saturation is 92% on room air. HEAD:  Atraumatic, normocephalic.  Eyes are anicteric with pupils are equal, round, reactive to light.  Her nose shows no nasal discharge or exterior lesions.  Mouth has moist mucous membranes with good dentition. NECK:  Supple with midline trachea.  No JVD.  No lymphadenopathy. CHEST:  Demonstrates no accessory muscle use.  She has no wheezes or crackles to my auscultation. HEART:  An irregularly irregular rhythm without  obvious murmurs, rubs or gallops. ABDOMEN:  Obese, soft, nontender, nondistended with active bowel sounds. EXTREMITIES:  Both lower extremities demonstrate moderate-to-severe lymphadenopathy.  Left lower extremity is still darkened in color and anteriorly is covered with scab.  Left foot is bandaged secondary to diabetic ulcerations. NEURO:  Cranial nerves II-XII appear grossly intact.  She has no facial asymmetries, no focal neuro deficits. PSYCHIATRIC:  The patient is alert and oriented.  Demeanor is pleasant, cooperative.  Grooming is excellent.  DISCHARGE INSTRUCTIONS:  The patient is to walk with assistance.  Her diet is to be low sodium which is very important to her secondary to her tendency to accumulate fluid.  Wound care will be per the wound care center.  She does have daily dressing changes that will be provided with [facility].  FOLLOWUP APPOINTMENTS:  She is to see Dr. Lestine Box, her orthopedic surgeon on April 14, 2011, at 9 a.m.  She is to schedule an appointment with Dr. Merri Brunette in the next 2-3 weeks to follow up on her fluid volume level, her electrolyte status and her diabetes as well as the patient's hemoglobin A1c was found to be above 8.  She is also to see Dr. Baltazar Najjar at the wound care center within the next week.  She will receive home health as well as PT and OT from [place].  The patient understands that she is to avoid any activities that cause chest pain, shortness of breath, dizziness, sweating, or excessive weakness.  She is to get exercise daily at least routine walking.  The patient is under the impression that she has been told by her doctors not exercise because of the cellulitis in her legs and she uses this as an excuse to stay in bed.  We will encourage the patient to be up and mobile as it will improve her back pain and to work with the physical therapist at home.  Approximately, 1 hour was spent on this discharge  summary.     Stephani Police, PA   ______________________________ Calvert Cantor, M.D.    MLY/MEDQ  D:  04/10/2011  T:  04/10/2011  Job:  [id number]  cc:  Dario Guardian, M.D. Jake Bathe, MD Leonides Grills, M.D.  Electronically Signed by Algis Downs PA on 04/27/2011 02:25:15 PM Electronically Signed by Calvert Cantor M.D. on 05/28/2011 12:06:51 PM

## 2011-05-29 LAB — DIFFERENTIAL
Basophils Relative: 1 % (ref 0–1)
Eosinophils Absolute: 0.2 10*3/uL (ref 0.0–0.7)
Eosinophils Relative: 3 % (ref 0–5)
Monocytes Absolute: 1 10*3/uL (ref 0.1–1.0)
Monocytes Relative: 16 % — ABNORMAL HIGH (ref 3–12)
Neutrophils Relative %: 63 % (ref 43–77)

## 2011-05-29 LAB — GLUCOSE, CAPILLARY: Glucose-Capillary: 167 mg/dL — ABNORMAL HIGH (ref 70–99)

## 2011-05-29 LAB — BASIC METABOLIC PANEL
BUN: 25 mg/dL — ABNORMAL HIGH (ref 6–23)
Calcium: 8.8 mg/dL (ref 8.4–10.5)
Creatinine, Ser: 1.13 mg/dL — ABNORMAL HIGH (ref 0.50–1.10)
GFR calc Af Amer: 57 mL/min — ABNORMAL LOW (ref >60)
GFR calc non Af Amer: 47 mL/min — ABNORMAL LOW (ref >60)

## 2011-05-29 LAB — CBC
MCH: 30.4 pg (ref 26.0–34.0)
MCHC: 32.6 g/dL (ref 30.0–36.0)
Platelets: 194 10*3/uL (ref 150–400)
RBC: 3.82 MIL/uL — ABNORMAL LOW (ref 3.87–5.11)
RDW: 16.6 % — ABNORMAL HIGH (ref 11.5–15.5)

## 2011-05-29 LAB — PRO B NATRIURETIC PEPTIDE: Pro B Natriuretic peptide (BNP): 1753 pg/mL — ABNORMAL HIGH (ref 0–125)

## 2011-05-30 LAB — GLUCOSE, CAPILLARY
Glucose-Capillary: 131 mg/dL — ABNORMAL HIGH (ref 70–99)
Glucose-Capillary: 133 mg/dL — ABNORMAL HIGH (ref 70–99)
Glucose-Capillary: 127 mg/dL — ABNORMAL HIGH (ref 70–99)

## 2011-05-30 LAB — BASIC METABOLIC PANEL
Chloride: 105 mEq/L (ref 96–112)
GFR calc Af Amer: 58 mL/min — ABNORMAL LOW (ref >60)
Potassium: 3.7 mEq/L (ref 3.5–5.1)

## 2011-05-31 LAB — BASIC METABOLIC PANEL
BUN: 25 mg/dL — ABNORMAL HIGH (ref 6–23)
Calcium: 9 mg/dL (ref 8.4–10.5)
GFR calc non Af Amer: 48 mL/min — ABNORMAL LOW (ref >60)
Glucose, Bld: 124 mg/dL — ABNORMAL HIGH (ref 70–99)

## 2011-05-31 LAB — GLUCOSE, CAPILLARY: Glucose-Capillary: 127 mg/dL — ABNORMAL HIGH (ref 70–99)

## 2011-06-01 LAB — BASIC METABOLIC PANEL
CO2: 27 mEq/L (ref 19–32)
Calcium: 8.6 mg/dL (ref 8.4–10.5)
Chloride: 103 mEq/L (ref 96–112)
GFR calc Af Amer: 58 mL/min — ABNORMAL LOW (ref >60)
Sodium: 139 mEq/L (ref 135–145)

## 2011-06-01 LAB — GLUCOSE, CAPILLARY
Glucose-Capillary: 120 mg/dL — ABNORMAL HIGH (ref 70–99)
Glucose-Capillary: 112 mg/dL — ABNORMAL HIGH (ref 70–99)

## 2011-06-01 LAB — CBC
MCH: 30.2 pg (ref 26.0–34.0)
Platelets: 191 10*3/uL (ref 150–400)
RBC: 3.67 MIL/uL — ABNORMAL LOW (ref 3.87–5.11)
WBC: 5.7 10*3/uL (ref 4.0–10.5)

## 2011-06-02 LAB — BASIC METABOLIC PANEL
CO2: 26 mEq/L (ref 19–32)
Chloride: 103 mEq/L (ref 96–112)
Creatinine, Ser: 0.98 mg/dL (ref 0.50–1.10)
GFR calc Af Amer: >60 mL/min (ref >60)
Potassium: 3.8 mEq/L (ref 3.5–5.1)
Sodium: 140 mEq/L (ref 135–145)

## 2011-06-02 LAB — CBC
MCV: 93.2 fL (ref 78.0–100.0)
Platelets: 192 10*3/uL (ref 150–400)
RBC: 3.82 MIL/uL — ABNORMAL LOW (ref 3.87–5.11)
WBC: 5.8 10*3/uL (ref 4.0–10.5)

## 2011-06-02 LAB — GLUCOSE, CAPILLARY
Glucose-Capillary: 120 mg/dL — ABNORMAL HIGH (ref 70–99)
Glucose-Capillary: 135 mg/dL — ABNORMAL HIGH (ref 70–99)

## 2011-06-04 NOTE — Consult Note (Signed)
Tara Huff, BAL NO.:  [id number]  MEDICAL RECORD NO.:  [id number]  LOCATION:  4729                         FACILITY:  [facility]  PHYSICIAN:  Lyn Records, M.D.   DATE OF BIRTH:  December 05, 1933  DATE OF CONSULTATION: DATE OF DISCHARGE:                                CONSULTATION   REASON FOR CONSULTATION:  Peripheral edema and heart failure.  CONCLUSIONS.: 1. Right heart failure with ascites and anasarca secondary to severe     pulmonary hypertension. 2. Pulmonary hypertension is likely multifactorial secondary to acute     on chronic diastolic left ventricular failure, obesity, sleep     apnea, and medication noncompliance (Lasix dose decreased because     of increased urinary frequency, and over the past 3 days before     admission it was totally discontinued by the patient). 3. Acute on chronic diastolic heart failure with pulmonary edema. 4. Morbid obesity. 5. Chronic atrial fibrillation.     a.     CHADS score greater than 3 in the past. 6. Diabetes mellitus.  RECOMMENDATIONS: 1. IV Lasix to mobilize third-space fluid. 2. O2 therapy. 3. Agree with rechecking echo to attempt to quantitate pulmonary     artery pressures. 4. Consider starting Coumadin therapy for treatment of atrial     fibrillation, embolic risk, and also that would be helpful to     prevent eventual pulmonary embolism. 5. The patient's overall prognosis is very poor and code status should     again be addressed.  Given her levels of comorbidities and     immobility, perhaps Palliative Care should be a consideration.  COMMENTS:  The patient is 77 years of age and is morbidly obese.  She is admitted to the hospital with severe shortness of breath.  According to the patient, her usual Lasix dose of 40 mg 3 times per day was decreased to twice per day several weeks ago.  At that point, she began gradually increasing in weight and retaining fluid.  Starting 2-3 days before admission  the patient stopped taking furosemide because she was unable to get up to urinate and she was concerned about wetting her bed.  She began having spells of severe shortness of breath that eventually led to hospitalization.  She also states that there were episodes of chest pain associated.  The patient's medical problems are listed above and include additional problems are:  Depression, hypertension, cervical cancer, status post hysterectomy, iron deficiency anemia, and osteomyelitis in the left lower extremity.  MEDICATIONS ON ADMISSION:  Should have included 1. Ferrous sulfate 325 mg b.i.d. 2. Diltiazem 180 mg daily. 3. Sertraline 50 mg daily. 4. Klor-Con 20 mEq b.i.d. 5. Vicodin 5/325 mg every 6-8 hours as needed for pain. 6. Vitamin D. 7. Lasix 40 mg t.i.d., but for more than a month she had only been     taking b.i.d. dosing, and for 2-3 days before admission she was     taking no Lasix. 8. Alprazolam 0.25 mg b.i.d. 9. Metoprolol 50 mg one half tablet twice daily. 10.Lantus insulin 20 units daily.  FAMILY HISTORY:  Noncontributory.ALLERGIES:  None known.  PHYSICAL EXAMINATION:  GENERAL:  The patient is markedly obese.  She is unable to sit up. VITAL SIGNS:  She weighs 153 kg.  Blood pressure is 114/71, O2 saturation is 97% on 2 liters.  While lying at 45 degrees, the patient's neck veins are as "strutted." RESPIRATION:  Decreased breath sounds are heard bilaterally. CARDIAC:  Exam is remarkable for a soft holosystolic murmur along the left mid and lower sternal border.  A right ventricular heave is palpable. EXTREMITIES:  Large and edematous with chronic stasis changes. ABDOMEN:  Lower abdominal wall edema is also noted. NEUROLOGIC:  The patient is alert and oriented.  Chest x-ray reveals cardiomegaly, pleural effusions but low lung volumes.  CT scan demonstrated no evidence of pulmonary emboli cardiomegaly, bilateral effusions, ascites, and interstitial lung  edema.  Lab data is significant for hemoglobin of 12.6, potassium of 3.9, creatinine of 0.97.  Multiple sets of cardiac markers are negative. Admitting pro BNP is 1707.  EKG demonstrates atrial fibrillation, right with axis, incomplete right bundle, controlled rate.  DISCUSSION:  Patient is readmitted to the hospital acutely because of noncompliance with diuretic therapy and has chronic severe comorbidities of obesity, untreated sleep apnea, severe pulmonary hypertension, diabetes mellitus, and chronic diastolic left heart failure.  The possibility of coronary artery disease also exists.  IV diuresis as indicated.  Coumadin therapy is indicated.  Weight reduction is a must. Overall prognosis is poor.     Lyn Records, M.D.     HWS/MEDQ  D:  05/28/2011  T:  05/29/2011  Job:  [id number]  cc:   Armanda Magic, M.D. Dario Guardian, M.D.  Electronically Signed by Verdis Prime M.D. on 06/04/2011 11:29:24 AM

## 2011-06-10 NOTE — Discharge Summary (Signed)
Tara Huff, Tara Huff               ACCOUNT NO.:  [id number]  MEDICAL RECORD NO.:  [id number]  LOCATION:                                 FACILITY:  PHYSICIAN:  Tara Huff I Tara Abrigo, MD      DATE OF BIRTH:  05/11/1934  DATE OF ADMISSION:  05/28/2011 DATE OF DISCHARGE:  06/02/2011                              DISCHARGE SUMMARY   DISCHARGE DIAGNOSES: 1. Acute on chronic diastolic congestive heart failure. 2. Morbid obesity. 3. Permanent atrial fibrillation.  The patient refused Coumadin, as     admitted she has complication from Coumadin. 4. Chronic lower extremity edema with chronic venous stasis. 5. Pulmonary hypertension. 6. Acute-on-chronic cor pulmonale secondary to obstructive sleep     apnea, obesity, and hypoventilation syndrome. 7. Severe chronic lower extremity edema and venous stasis. 8. Left foot ulcer, follow up at wound care. 9. Hypertension. 10.Acute renal failure, resolved. 11.Hypokalemia. 12.Type 2 diabetes mellitus.  DISCHARGE MEDICATIONS: 1. Aspirin 81 mg enteric coated p.o. daily. 2. Fluconazole 100 mg p.o. daily for 5 days. 3. Furosemide 80 mg p.o. b.i.d. 4. Potassium chloride 30 mEq p.o. b.i.d. 5. Alprazolam 0.25 mg p.o. daily as needed. 6. Diltiazem CD XT 180 mg p.o. daily. 7. Ferrous sulfate 325 mg p.o. b.i.d. 8. Hydrocodone/APAP 5/325 1 tab q.6 h. as needed. 9. Metoprolol 25 mg p.o. b.i.d. 10.Pramipexole 0.25 mg half tab to one tab daily at bedtime. 11.Vitamin D3 1 tab daily. 12.Insulin sliding scale t.i.d. a.c. and at bedtime. 13.Miconazole nitrate 1 application topically as needed. 14.Zoloft 50 mg p.o. daily. 15.Oxygen 1-2 L daily. 16.The patient will be discharged with Foley catheter for a total of     24 hours and then can be discontinued. 17.Insulin Lantus 10 units subcu daily.  CONSULTATIONS:  Cardiology consulted.  HISTORY OF PRESENT ILLNESS:  This is a 77 year old Caucasian female with history of diabetes mellitus; peripheral edema, chronic,  including extreme venous stasis and a diabetic wound ulcer; chronic AFib, presented with 5 days history of worsening shortness of breath.  She has generalized deconditioning.  She described mild exertion.  The patient denies any fevers, chills, nausea, vomiting, or diarrhea.  Denies any cough.  In the emergency room, the patient was found to have vital signs; temperature 97.7, heart rate 91, blood pressure 121/74, respiratory rate was 26, and oxygen sats were 98% on 2 L nasal cannula.  Lungs, she has bilateral rales, left greater than right with mild wheezing.  She is obese and she has chronic venous stasis and edema and severe keratoses of the lower extremity bilateral.  She also has extreme onychomycosis of all of her toenails.  Also, she has 1.5-cm ulcer on the plantar surface of her left foot that approximately stage II ulcer.  The patient is able to move all her 4 extremities without difficulty.  PROCEDURE: 1. Chest x-ray did show no acute disease. 2. CT angio, cardiomegaly, bilateral pleural effusion, ascites,     interstitial edema, may reflect congestive heart failure.  1. Acute on chronic diastolic congestive heart failure.  The patient     is started on IV Lasix 80 mg IV b.i.d.  Cardiac enzymes were cycled  which were negative.  ProBNP was elevated.  Shortness of breath     felt to be secondary to acute on chronic diastolic congestive heart     failure, in addition, complicated by obesity, hypoventilation     syndrome, cor pulmonale, and pulmonary hypertension.  She has a 2-D     echo which suggests EF 50% to 55%, atrium moderately dilated,     systolic function was moderately reduced, systolic pressure was     increased, and right atrium moderately dilated.  Pulmonary artery     peak arterial pressure around 50.  The patient's symptoms     significantly improved.  The patient will be discharged with Lasix     and metoprolol.  There is some elevation of hard kidney  function,     which felt to be secondary to diuretics.  We decreased the dose to     80 mg p.o. b.i.d. 2. AFib seems chronic.  The patient refused Coumadin secondary to     complication previously.  Accordingly, the patient was started on     aspirin 81 mg p.o. daily.  Risks of stroke and pulmonary embolism     were explained to the patient as the patient is completely     bedridden. 3. Left foot wounds.  Wound care, follow up the patient.  The patient     has a history of osteomyelitis.  Followed by a physician as an     outpatient.  Dr.  Lestine Box recommend followup with him in the next 2-     3 weeks.  Please arrange followup for the patient's left foot     ulcer/osteomyelitis with Dr. Lestine Box.  Also, the patient needs to     continue wound care at the nursing home facility. 4. Genital yeast infection from overweight and the patient will be     discharged with small dose of Diflucan for 5-7 days. 5. Diabetes mellitus seems well controlled during hospital stay.  We     will start the patient with small dose of Lantus 10 units subcu     p.o. at bedtime in addition to sliding scale.  Currently, we felt the patient is stable to be discharged to nursing facility with one care at rehab.  She will continue with home oxygen 2 L, also the patient will need to follow up with Dr. Armanda Huff, her primary cardiologist as an outpatient within the next 1 month.  Also need to follow up with Dr. Lestine Box, the infectious disease doctor as an outpatient for her left foot wound.  VITAL SIGNS:  Currently, the patient's vital signs; temperature 97.8, pulse rate 91, respiratory rate 16, blood pressure 121/74, and saturating 94% on 2 L. HEENT:  The patient is not in respiratory distress or shortness of breath.  No JVD. LUNGS:  Normal vesicular breathing with equal air entry. HEART:  S1 and S2.  Irregularly irregular. ABDOMEN:  Obese with subcutaneous edema and lower extremity chronic venous stasis and  chronic edema of the subcutaneous tissue.     Tara Vandevelde Bosie Helper, MD     HIE/MEDQ  D:  06/02/2011  T:  06/02/2011  Job:  [id number]  cc:   Tara Huff, M.D. Dario Guardian, M.D. Dr. Lestine Box Dr. Roxan Hockey  Electronically Signed by Ebony Cargo MD on 06/10/2011 01:49:05 PM

## 2011-06-25 ENCOUNTER — Encounter (HOSPITAL_BASED_OUTPATIENT_CLINIC_OR_DEPARTMENT_OTHER): Payer: Medicare Other | Attending: Internal Medicine

## 2011-06-25 DIAGNOSIS — I10 Essential (primary) hypertension: Secondary | ICD-10-CM | POA: Insufficient documentation

## 2011-06-25 DIAGNOSIS — E1169 Type 2 diabetes mellitus with other specified complication: Secondary | ICD-10-CM | POA: Insufficient documentation

## 2011-06-25 DIAGNOSIS — Z794 Long term (current) use of insulin: Secondary | ICD-10-CM | POA: Insufficient documentation

## 2011-06-25 DIAGNOSIS — L97509 Non-pressure chronic ulcer of other part of unspecified foot with unspecified severity: Secondary | ICD-10-CM | POA: Insufficient documentation

## 2011-06-25 DIAGNOSIS — Z79899 Other long term (current) drug therapy: Secondary | ICD-10-CM | POA: Insufficient documentation

## 2011-07-16 ENCOUNTER — Encounter (HOSPITAL_BASED_OUTPATIENT_CLINIC_OR_DEPARTMENT_OTHER): Payer: Medicare Other

## 2011-08-14 LAB — GLUCOSE, CAPILLARY
Glucose-Capillary: 130 mg/dL — ABNORMAL HIGH (ref 70–99)
Glucose-Capillary: 140 mg/dL — ABNORMAL HIGH (ref 70–99)
Glucose-Capillary: 141 mg/dL — ABNORMAL HIGH (ref 70–99)
Glucose-Capillary: 205 mg/dL — ABNORMAL HIGH (ref 70–99)
Glucose-Capillary: 232 mg/dL — ABNORMAL HIGH (ref 70–99)
Glucose-Capillary: 249 mg/dL — ABNORMAL HIGH (ref 70–99)

## 2011-08-14 LAB — COMPREHENSIVE METABOLIC PANEL
ALT: 22 U/L (ref 0–35)
Alkaline Phosphatase: 91 U/L (ref 39–117)
BUN: 19 mg/dL (ref 6–23)
Chloride: 103 mEq/L (ref 96–112)
Glucose, Bld: 259 mg/dL — ABNORMAL HIGH (ref 70–99)
Potassium: 4.4 mEq/L (ref 3.5–5.1)
Sodium: 135 mEq/L (ref 135–145)
Total Bilirubin: 0.7 mg/dL (ref 0.3–1.2)

## 2011-08-14 LAB — DIFFERENTIAL
Basophils Absolute: 0 10*3/uL (ref 0.0–0.1)
Basophils Relative: 0 % (ref 0–1)
Neutro Abs: 4.3 10*3/uL (ref 1.7–7.7)
Neutrophils Relative %: 67 % (ref 43–77)

## 2011-08-14 LAB — BASIC METABOLIC PANEL
BUN: 14 mg/dL (ref 6–23)
CO2: 27 mEq/L (ref 19–32)
Calcium: 8.7 mg/dL (ref 8.4–10.5)
Chloride: 100 mEq/L (ref 96–112)
Chloride: 100 mEq/L (ref 96–112)
Creatinine, Ser: 1.1 mg/dL (ref 0.4–1.2)
Creatinine, Ser: 1.14 mg/dL (ref 0.4–1.2)
Creatinine, Ser: 1.14 mg/dL (ref 0.4–1.2)
GFR calc Af Amer: 57 mL/min — ABNORMAL LOW (ref >60)
GFR calc Af Amer: 57 mL/min — ABNORMAL LOW (ref >60)
GFR calc Af Amer: 59 mL/min — ABNORMAL LOW (ref >60)
GFR calc non Af Amer: 47 mL/min — ABNORMAL LOW (ref >60)
Glucose, Bld: 165 mg/dL — ABNORMAL HIGH (ref 70–99)
Potassium: 3.6 mEq/L (ref 3.5–5.1)
Potassium: 3.9 mEq/L (ref 3.5–5.1)
Sodium: 133 mEq/L — ABNORMAL LOW (ref 135–145)

## 2011-08-14 LAB — CBC
HCT: 41.8 % (ref 36.0–46.0)
HCT: 43.7 % (ref 36.0–46.0)
Hemoglobin: 14.7 g/dL (ref 12.0–15.0)
MCHC: 33.9 g/dL (ref 30.0–36.0)
MCV: 93.5 fL (ref 78.0–100.0)
MCV: 93.6 fL (ref 78.0–100.0)
Platelets: 249 10*3/uL (ref 150–400)
RBC: 4.41 MIL/uL (ref 3.87–5.11)
RBC: 4.46 MIL/uL (ref 3.87–5.11)
RBC: 4.64 MIL/uL (ref 3.87–5.11)
RDW: 12.9 % (ref 11.5–15.5)
WBC: 6.5 10*3/uL (ref 4.0–10.5)
WBC: 9.7 10*3/uL (ref 4.0–10.5)
WBC: 9.7 10*3/uL (ref 4.0–10.5)

## 2011-08-14 LAB — WOUND CULTURE: Gram Stain: NONE SEEN

## 2011-09-01 ENCOUNTER — Emergency Department (INDEPENDENT_AMBULATORY_CARE_PROVIDER_SITE_OTHER): Payer: Medicare Other

## 2011-09-01 ENCOUNTER — Encounter (HOSPITAL_BASED_OUTPATIENT_CLINIC_OR_DEPARTMENT_OTHER): Payer: Self-pay | Admitting: *Deleted

## 2011-09-01 ENCOUNTER — Other Ambulatory Visit: Payer: Self-pay

## 2011-09-01 ENCOUNTER — Emergency Department (HOSPITAL_BASED_OUTPATIENT_CLINIC_OR_DEPARTMENT_OTHER)
Admission: EM | Admit: 2011-09-01 | Discharge: 2011-09-02 | Disposition: A | Discharge status: Other Acute Inpatient Hospital | Payer: Medicare Other | Source: Home / Self Care | Attending: Emergency Medicine | Admitting: Emergency Medicine

## 2011-09-01 DIAGNOSIS — M7989 Other specified soft tissue disorders: Secondary | ICD-10-CM

## 2011-09-01 DIAGNOSIS — M79609 Pain in unspecified limb: Secondary | ICD-10-CM

## 2011-09-01 DIAGNOSIS — I252 Old myocardial infarction: Secondary | ICD-10-CM | POA: Insufficient documentation

## 2011-09-01 DIAGNOSIS — R112 Nausea with vomiting, unspecified: Secondary | ICD-10-CM

## 2011-09-01 DIAGNOSIS — M8970 Major osseous defect, unspecified site: Secondary | ICD-10-CM

## 2011-09-01 DIAGNOSIS — R197 Diarrhea, unspecified: Secondary | ICD-10-CM

## 2011-09-01 DIAGNOSIS — I2789 Other specified pulmonary heart diseases: Secondary | ICD-10-CM | POA: Insufficient documentation

## 2011-09-01 DIAGNOSIS — N390 Urinary tract infection, site not specified: Secondary | ICD-10-CM

## 2011-09-01 DIAGNOSIS — R1110 Vomiting, unspecified: Secondary | ICD-10-CM | POA: Insufficient documentation

## 2011-09-01 DIAGNOSIS — M25579 Pain in unspecified ankle and joints of unspecified foot: Secondary | ICD-10-CM | POA: Insufficient documentation

## 2011-09-01 DIAGNOSIS — E119 Type 2 diabetes mellitus without complications: Secondary | ICD-10-CM | POA: Insufficient documentation

## 2011-09-01 DIAGNOSIS — I10 Essential (primary) hypertension: Secondary | ICD-10-CM | POA: Insufficient documentation

## 2011-09-01 DIAGNOSIS — I4891 Unspecified atrial fibrillation: Secondary | ICD-10-CM | POA: Insufficient documentation

## 2011-09-01 DIAGNOSIS — M7730 Calcaneal spur, unspecified foot: Secondary | ICD-10-CM | POA: Insufficient documentation

## 2011-09-01 DIAGNOSIS — R069 Unspecified abnormalities of breathing: Secondary | ICD-10-CM | POA: Insufficient documentation

## 2011-09-01 DIAGNOSIS — R079 Chest pain, unspecified: Secondary | ICD-10-CM

## 2011-09-01 HISTORY — DX: Unspecified atrial fibrillation: I48.91

## 2011-09-01 HISTORY — DX: Acute myocardial infarction, unspecified: I21.9

## 2011-09-01 HISTORY — DX: Anxiety disorder, unspecified: F41.9

## 2011-09-01 LAB — CBC
HCT: 33 % — ABNORMAL LOW (ref 36.0–46.0)
MCV: 88.9 fL (ref 78.0–100.0)
Platelets: 209 10*3/uL (ref 150–400)
RBC: 3.71 MIL/uL — ABNORMAL LOW (ref 3.87–5.11)
WBC: 18.6 10*3/uL — ABNORMAL HIGH (ref 4.0–10.5)

## 2011-09-01 LAB — URINE MICROSCOPIC-ADD ON

## 2011-09-01 LAB — URINALYSIS, ROUTINE W REFLEX MICROSCOPIC
Glucose, UA: NEGATIVE mg/dL
Ketones, ur: NEGATIVE mg/dL
Specific Gravity, Urine: 1.019 (ref 1.005–1.030)
pH: 5 (ref 5.0–8.0)

## 2011-09-01 LAB — DIFFERENTIAL
Eosinophils Relative: 0 % (ref 0–5)
Lymphocytes Relative: 4 % — ABNORMAL LOW (ref 12–46)
Lymphs Abs: 0.7 10*3/uL (ref 0.7–4.0)
Monocytes Absolute: 0.5 10*3/uL (ref 0.1–1.0)
Neutro Abs: 17.4 10*3/uL — ABNORMAL HIGH (ref 1.7–7.7)

## 2011-09-01 LAB — CARDIAC PANEL(CRET KIN+CKTOT+MB+TROPI)
Relative Index: INVALID (ref 0.0–2.5)
Total CK: 45 U/L (ref 7–177)

## 2011-09-01 LAB — COMPREHENSIVE METABOLIC PANEL
Albumin: 2.7 g/dL — ABNORMAL LOW (ref 3.5–5.2)
BUN: 27 mg/dL — ABNORMAL HIGH (ref 6–23)
Calcium: 9 mg/dL (ref 8.4–10.5)
Creatinine, Ser: 0.9 mg/dL (ref 0.50–1.10)
Total Protein: 7.2 g/dL (ref 6.0–8.3)

## 2011-09-01 LAB — LIPASE, BLOOD: Lipase: 7 U/L — ABNORMAL LOW (ref 11–59)

## 2011-09-01 LAB — PRO B NATRIURETIC PEPTIDE: Pro B Natriuretic peptide (BNP): 11323 pg/mL — ABNORMAL HIGH (ref 0–125)

## 2011-09-01 MED ORDER — VANCOMYCIN HCL 10 G IV SOLR
1.0000 g | Freq: Once | INTRAVENOUS | Status: AC
  Administered 2011-09-01: 1 g via INTRAVENOUS
  Filled 2011-09-01: qty 1000

## 2011-09-01 MED ORDER — PIPERACILLIN-TAZOBACTAM 3.375 G IVPB 30 MIN
3.3750 g | Freq: Three times a day (TID) | INTRAVENOUS | Status: DC
  Administered 2011-09-01: 3.375 g via INTRAVENOUS
  Filled 2011-09-01 (×2): qty 50

## 2011-09-01 MED ORDER — SODIUM CHLORIDE 0.9 % IV SOLN
Freq: Once | INTRAVENOUS | Status: AC
  Administered 2011-09-01: 20:00:00 via INTRAVENOUS

## 2011-09-01 MED ORDER — VANCOMYCIN HCL IN DEXTROSE 1-5 GM/200ML-% IV SOLN
INTRAVENOUS | Status: AC
  Filled 2011-09-01: qty 200

## 2011-09-01 MED ORDER — ONDANSETRON HCL 4 MG/2ML IJ SOLN
4.0000 mg | Freq: Once | INTRAMUSCULAR | Status: AC
  Administered 2011-09-01: 4 mg via INTRAVENOUS
  Filled 2011-09-01: qty 2

## 2011-09-01 MED ORDER — DEXTROSE 5 % IV SOLN
1.0000 g | Freq: Once | INTRAVENOUS | Status: AC
  Administered 2011-09-01: 1 g via INTRAVENOUS
  Filled 2011-09-01: qty 1

## 2011-09-01 MED ORDER — HYDROCODONE-ACETAMINOPHEN 5-325 MG PO TABS
1.0000 | ORAL_TABLET | Freq: Once | ORAL | Status: AC
  Administered 2011-09-01: 1 via ORAL
  Filled 2011-09-01: qty 1

## 2011-09-01 NOTE — ED Notes (Signed)
Waiting on an available admission bed. Pt aware.

## 2011-09-01 NOTE — ED Notes (Signed)
Dr Freida Busman at bedside discussing test results and plans for admission. Pt was repositioned in bed. Pillows placed underneath legs for comfort. HOB adjusted for comfort. Rocephin infused. Site saline locked.

## 2011-09-01 NOTE — ED Notes (Signed)
31F urinary catheter remains in place at time of transfer. Charted 'removed" for documentation purposes only.

## 2011-09-01 NOTE — ED Notes (Signed)
[facility]ia [facility]--spoke with Terri. 

## 2011-09-01 NOTE — ED Notes (Signed)
Pt took her Xanax 0.25mg  from home medication bottle per Dr West Carbo permission. Pt reported feeling "very anxious" and does appear to be restless.

## 2011-09-01 NOTE — ED Notes (Signed)
Per EMS: pt from home. Reports N/V/D x4 days with weakness x 1 day. Pt uses a walker, and was sitting on the edge of the bed upon EMS arrival. Has a home health nurse that comes out to home twice a week.

## 2011-09-01 NOTE — ED Notes (Signed)
Pt called RN to room. States her buttocks and lower back are hurting "from this uncomfortable bed". Pt requesting pain meds. Will notify MD.

## 2011-09-01 NOTE — ED Notes (Signed)
Transfer consent form signed. [facility] en route

## 2011-09-01 NOTE — ED Provider Notes (Signed)
History     CSN: [id number] Arrival date & time: 09/01/2011  7:38 PM   First MD Initiated Contact with Patient 09/01/11 1942      Chief Complaint  Patient presents with  . Emesis    (Consider location/radiation/quality/duration/timing/severity/associated sxs/prior treatment) Patient is a 77 y.o. female presenting with vomiting. The history is provided by the patient.  Emesis    patient here with vomiting diarrhea and nausea x9 days worse for the last 3 days. No reported fever noted. No blood or bilious emesis noted denies any black or bloody stools. Some abdominal cramping. Denies any urinary symptoms at this time. Those issues have diffuse whole-body weakness, denies any severe productive cough. Patient seen by home health nurse today who was concerned and had the patient come here for further evaluation. Patient notes worsening wound to left foot. Patient has difficulty ambulating at this time. Some anorexia noted. Nothing makes her symptoms better or worse  Past Medical History  Diagnosis Date  . Hypertension   . Diabetes mellitus   . Atrial fibrillation   . MI (myocardial infarction)     No past surgical history on file.  No family history on file.  History  Substance Use Topics  . Smoking status: Not on file  . Smokeless tobacco: Not on file  . Alcohol Use:     OB History    Grav Para Term Preterm Abortions TAB SAB Ect Mult Living                  Review of Systems  Gastrointestinal: Positive for vomiting.  All other systems reviewed and are negative.    Allergies  Review of patient's allergies indicates no known allergies.  Home Medications  No current outpatient prescriptions on file.  BP 111/68  Pulse 75  Temp(Src) 97.9 F (36.6 C) (Oral)  Resp 20  Ht 5\' 8"  (1.727 m)  Wt 260 lb (117.935 kg)  BMI 39.53 kg/m2  SpO2 99%  Physical Exam  Nursing note and vitals reviewed. Constitutional: She is oriented to person, place, and time. She appears  well-developed and well-nourished.  Non-toxic appearance. No distress.  HENT:  Head: Normocephalic and atraumatic.  Eyes: Conjunctivae and EOM are normal. Pupils are equal, round, and reactive to light.  Neck: Normal range of motion. Neck supple. No tracheal deviation present.  Cardiovascular: Normal rate, regular rhythm and normal heart sounds.  Exam reveals no gallop.   No murmur heard. Pulmonary/Chest: Effort normal and breath sounds normal. No stridor. No respiratory distress. She has no wheezes.  Abdominal: Soft. Normal appearance and bowel sounds are normal. She exhibits no distension. There is no tenderness. There is no rebound and no CVA tenderness.  Musculoskeletal: Normal range of motion. She exhibits no edema and no tenderness.       Left foot: She exhibits tenderness and swelling.       Feet:  Neurological: She is alert and oriented to person, place, and time. She has normal strength. No cranial nerve deficit or sensory deficit. GCS eye subscore is 4. GCS verbal subscore is 5. GCS motor subscore is 6.  Skin: Skin is warm and dry.     Psychiatric: She has a normal mood and affect. Her speech is normal and behavior is normal.    ED Course  Procedures (including critical care time)   Labs Reviewed  COMPREHENSIVE METABOLIC PANEL  CBC  DIFFERENTIAL  URINALYSIS, ROUTINE W REFLEX MICROSCOPIC  LACTIC ACID, PLASMA  LIPASE, BLOOD   No  results found.   No diagnosis found.    MDM   Date: 09/01/2011  Rate: 110  Rhythm: atrial fibrillation  QRS Axis: normal  Intervals: normal  ST/T Wave abnormalities: normal  Conduction Disutrbances:nonspecific intraventricular conduction delay  Narrative Interpretation:   Old EKG Reviewed: unchanged   Patient given Rocephin for UTI. X-ray results of the foot were noted to be concerning for osteomyelitis. Vancomycin and Zosyn were started. Spoke with Dr. Onalee Hua from [facility] and she has accepted the patient in  transfer       Toy Baker, MD 09/01/11 2259

## 2011-09-01 NOTE — ED Notes (Signed)
IV charted 'removed" for documetation purposes only. Remains intact with 20g infusing with Vancomycin.

## 2011-09-01 NOTE — ED Notes (Addendum)
Pt cleaned of dried stool prior to urinary catheter insertion. Pt noted to have large degree of redness to buttocks, with small area of breakdown to inner buttock. Pt states "yeh im not surprised its red, i've been laying in poop and urine for a few days". Pt states she has been too weak over the past few days to ambulate, and "ive been laying around a lot".

## 2011-09-01 NOTE — ED Notes (Signed)
Pt transported to XR at this time.

## 2011-09-02 ENCOUNTER — Inpatient Hospital Stay (HOSPITAL_COMMUNITY)
Admission: AD | Admit: 2011-09-02 | Discharge: 2011-09-09 | DRG: 637 | Disposition: A | Discharge status: Skilled Nursing Facility | Payer: Medicare Other | Source: Other Acute Inpatient Hospital | Attending: Family Medicine | Admitting: Family Medicine

## 2011-09-02 ENCOUNTER — Inpatient Hospital Stay (HOSPITAL_COMMUNITY): Payer: Medicare Other

## 2011-09-02 DIAGNOSIS — L02619 Cutaneous abscess of unspecified foot: Secondary | ICD-10-CM | POA: Diagnosis present

## 2011-09-02 DIAGNOSIS — I5033 Acute on chronic diastolic (congestive) heart failure: Secondary | ICD-10-CM | POA: Diagnosis present

## 2011-09-02 DIAGNOSIS — M869 Osteomyelitis, unspecified: Secondary | ICD-10-CM | POA: Diagnosis present

## 2011-09-02 DIAGNOSIS — I2789 Other specified pulmonary heart diseases: Secondary | ICD-10-CM | POA: Diagnosis present

## 2011-09-02 DIAGNOSIS — Z794 Long term (current) use of insulin: Secondary | ICD-10-CM

## 2011-09-02 DIAGNOSIS — I129 Hypertensive chronic kidney disease with stage 1 through stage 4 chronic kidney disease, or unspecified chronic kidney disease: Secondary | ICD-10-CM | POA: Diagnosis present

## 2011-09-02 DIAGNOSIS — L97509 Non-pressure chronic ulcer of other part of unspecified foot with unspecified severity: Secondary | ICD-10-CM | POA: Diagnosis present

## 2011-09-02 DIAGNOSIS — I509 Heart failure, unspecified: Secondary | ICD-10-CM | POA: Diagnosis present

## 2011-09-02 DIAGNOSIS — I890 Lymphedema, not elsewhere classified: Secondary | ICD-10-CM | POA: Diagnosis present

## 2011-09-02 DIAGNOSIS — N182 Chronic kidney disease, stage 2 (mild): Secondary | ICD-10-CM | POA: Diagnosis present

## 2011-09-02 DIAGNOSIS — E1169 Type 2 diabetes mellitus with other specified complication: Principal | ICD-10-CM | POA: Diagnosis present

## 2011-09-02 DIAGNOSIS — F341 Dysthymic disorder: Secondary | ICD-10-CM | POA: Diagnosis present

## 2011-09-02 DIAGNOSIS — Z7982 Long term (current) use of aspirin: Secondary | ICD-10-CM

## 2011-09-02 DIAGNOSIS — N390 Urinary tract infection, site not specified: Secondary | ICD-10-CM | POA: Diagnosis present

## 2011-09-02 DIAGNOSIS — I252 Old myocardial infarction: Secondary | ICD-10-CM

## 2011-09-02 DIAGNOSIS — R609 Edema, unspecified: Secondary | ICD-10-CM

## 2011-09-02 DIAGNOSIS — I4891 Unspecified atrial fibrillation: Secondary | ICD-10-CM | POA: Diagnosis present

## 2011-09-02 DIAGNOSIS — E662 Morbid (severe) obesity with alveolar hypoventilation: Secondary | ICD-10-CM | POA: Diagnosis present

## 2011-09-02 DIAGNOSIS — Z79899 Other long term (current) drug therapy: Secondary | ICD-10-CM

## 2011-09-02 DIAGNOSIS — N179 Acute kidney failure, unspecified: Secondary | ICD-10-CM | POA: Diagnosis present

## 2011-09-02 DIAGNOSIS — R5381 Other malaise: Secondary | ICD-10-CM | POA: Diagnosis present

## 2011-09-02 DIAGNOSIS — M9080 Osteopathy in diseases classified elsewhere, unspecified site: Secondary | ICD-10-CM | POA: Diagnosis present

## 2011-09-02 LAB — BLOOD GAS, ARTERIAL
Acid-base deficit: 4.3 mmol/L — ABNORMAL HIGH (ref 0.0–2.0)
Bicarbonate: 19.5 mEq/L — ABNORMAL LOW (ref 20.0–24.0)
O2 Saturation: 95.5 %
Patient temperature: 98.6
TCO2: 20.5 mmol/L (ref 0–100)
pH, Arterial: 7.41 — ABNORMAL HIGH (ref 7.350–7.400)

## 2011-09-02 LAB — GLUCOSE, CAPILLARY
Glucose-Capillary: 94 mg/dL (ref 70–99)
Glucose-Capillary: 166 mg/dL — ABNORMAL HIGH (ref 70–99)

## 2011-09-02 LAB — CK TOTAL AND CKMB (NOT AT ARMC)
CK, MB: 2.2 ng/mL (ref 0.3–4.0)
Relative Index: INVALID (ref 0.0–2.5)
Total CK: 27 U/L (ref 7–177)

## 2011-09-02 NOTE — ED Notes (Signed)
Report given to [facility] on 4500.

## 2011-09-02 NOTE — ED Notes (Signed)
Attempting to call report. RN unable to take report at this time. Instructed to call back when available.

## 2011-09-02 NOTE — ED Notes (Signed)
[facility] assisting pt onto stretcher to load into truck.

## 2011-09-03 ENCOUNTER — Inpatient Hospital Stay (HOSPITAL_COMMUNITY): Payer: Medicare Other

## 2011-09-03 LAB — BASIC METABOLIC PANEL
Calcium: 8.8 mg/dL (ref 8.4–10.5)
Creatinine, Ser: 1.13 mg/dL — ABNORMAL HIGH (ref 0.50–1.10)
GFR calc Af Amer: 54 mL/min — ABNORMAL LOW (ref >90)

## 2011-09-03 LAB — CBC
MCH: 30.2 pg (ref 26.0–34.0)
MCV: 89.7 fL (ref 78.0–100.0)
Platelets: 190 10*3/uL (ref 150–400)
RDW: 17.8 % — ABNORMAL HIGH (ref 11.5–15.5)
WBC: 7.1 10*3/uL (ref 4.0–10.5)

## 2011-09-03 LAB — GLUCOSE, CAPILLARY
Glucose-Capillary: 91 mg/dL (ref 70–99)
Glucose-Capillary: 124 mg/dL — ABNORMAL HIGH (ref 70–99)

## 2011-09-04 LAB — BASIC METABOLIC PANEL
CO2: 26 mEq/L (ref 19–32)
Calcium: 8.8 mg/dL (ref 8.4–10.5)
Chloride: 103 mEq/L (ref 96–112)
Sodium: 136 mEq/L (ref 135–145)

## 2011-09-04 LAB — GLUCOSE, CAPILLARY
Glucose-Capillary: 69 mg/dL — ABNORMAL LOW (ref 70–99)
Glucose-Capillary: 85 mg/dL (ref 70–99)
Glucose-Capillary: 116 mg/dL — ABNORMAL HIGH (ref 70–99)

## 2011-09-04 LAB — VANCOMYCIN, TROUGH: Vancomycin Tr: 31.5 ug/mL (ref 10.0–20.0)

## 2011-09-04 MED ORDER — GADOBENATE DIMEGLUMINE 529 MG/ML IV SOLN
20.0000 mL | Freq: Once | INTRAVENOUS | Status: AC
  Administered 2011-09-03: 20 mL via INTRAVENOUS

## 2011-09-04 NOTE — Consult Note (Signed)
Tara Huff, Tara Huff NO.:  [id number]  MEDICAL RECORD NO.:  [id number]  LOCATION:  4501                         FACILITY:  [facility]  PHYSICIAN:  Jene Every, M.D.    DATE OF BIRTH:  [birth year]  DATE OF CONSULTATION:  09/03/2011 DATE OF DISCHARGE:                                CONSULTATION   CHIEF COMPLAINT:  Left foot pain.  HISTORY:  This is a 77 year old female who has been followed by Dr. Lestine Box for a chronic osteomyelitis and diabetic foot ulcer, who was admitted recently with nausea, vomiting, and weakness.  She was seen in the emergency room, noted to have a decubitus ulcer over left foot.  She was treated and seen back in July 2012 and in May 2012, for that particular problem, had IV antibiotics and wound care [facility].  She reported complete resolution of her ulcer, then subsequent recurrence of her ulcer, pain, and swelling.  She did not have fevers and chills at that point in time.  We were consulted here. She was seen and admitted by the Medical Service.  Dr. Lestine Box was consulted, was unavailable, and I am seeing her in his absence.  REVIEW OF SYSTEMS:  Systems is negative for fevers, chills, or night sweats.  PAST MEDICAL HISTORY:  Hypertension, diabetes, congestive heart failure, AFib, morbid obesity, history of renal failure.  ALLERGIES:  None.  SOCIAL HISTORY:  Negative tobacco, negative EtOH.  Lives at home.  CURRENT MEDICATIONS:  Hydrocodone, prednisone, Xanax, Lasix, Zoloft, Lopressor, KCl, and intravenous antibiotics.  LABS:  Positive UTI.  Her current white blood cell count 7.1.  Initial white count was 18.6.  X-rays of the foot demonstrate osseous changes in the second metatarsal head, consistent with chronic osteo, but no subcutaneous gas is noted.  MRI of the foot is pending.  She is unable to tolerate first pass secondary to anxiety.  PHYSICAL EXAMINATION:  GENERAL:  Elderly female, in mild  distress.  Mood and affect are appropriate. EXTREMITIES:  Lower extremities morbid obesity, chronic stasis changes in both lower extremities.  Left foot, there is a callus formation over the plantar aspect of the foot.  There is no drainage.  There is minimal pain with palpation.  She has good dorsiflexion, plantar flexion, 1+ dorsalis pedis posterior tibial pulse.  Good capillary refill.  Unable to express any purulence.  No evidence of DVT.  IMPRESSION: 1. Recurrence diabetic foot ulcer of the left foot with aggravating     calliosity. 2. Underlying osteomyelitis of the second metatarsal head, most likely     chronic. 3. Multiple comorbidities, admitted for urinary tract infection.  PLAN AND RECOMMENDATIONS:  Extensive discussion with signs, concerning current situation.  I have encouraged her to obtain an MRI of her foot for further evaluation.  In the interim, I agree with wound care management for debridement and debulking of the callus and resume her wound management.  In addition, continue with IV vancomycin and Zosyn. Nonweightbearing of the extremity.  She does appear to have adequate vascularity of the heel of the left lower extremity.  I will refer subsequent treatment to Dr. Fonnie Jarvis.  We will discuss this case  with him.     Jene Every, M.D.     Cordelia Pen  D:  09/03/2011  T:  09/03/2011  Job:  [id number]  cc:   Leonides Grills, M.D.  Electronically Signed by Jene Every M.D. on 09/04/2011 05:21:42 PM

## 2011-09-05 LAB — GLUCOSE, CAPILLARY: Glucose-Capillary: 130 mg/dL — ABNORMAL HIGH (ref 70–99)

## 2011-09-05 LAB — BASIC METABOLIC PANEL
BUN: 22 mg/dL (ref 6–23)
CO2: 26 mEq/L (ref 19–32)
Calcium: 9 mg/dL (ref 8.4–10.5)
Creatinine, Ser: 1.1 mg/dL (ref 0.50–1.10)

## 2011-09-05 LAB — SEDIMENTATION RATE: Sed Rate: 79 mm/hr — ABNORMAL HIGH (ref 0–22)

## 2011-09-06 LAB — CBC
Hemoglobin: 10.6 g/dL — ABNORMAL LOW (ref 12.0–15.0)
MCHC: 32.2 g/dL (ref 30.0–36.0)
RBC: 3.59 MIL/uL — ABNORMAL LOW (ref 3.87–5.11)
WBC: 5.4 10*3/uL (ref 4.0–10.5)

## 2011-09-06 LAB — GLUCOSE, CAPILLARY
Glucose-Capillary: 104 mg/dL — ABNORMAL HIGH (ref 70–99)
Glucose-Capillary: 121 mg/dL — ABNORMAL HIGH (ref 70–99)

## 2011-09-06 LAB — BASIC METABOLIC PANEL
BUN: 19 mg/dL (ref 6–23)
GFR calc Af Amer: 63 mL/min — ABNORMAL LOW (ref >90)
GFR calc non Af Amer: 54 mL/min — ABNORMAL LOW (ref >90)
Potassium: 4 mEq/L (ref 3.5–5.1)

## 2011-09-07 LAB — GLUCOSE, CAPILLARY
Glucose-Capillary: 106 mg/dL — ABNORMAL HIGH (ref 70–99)
Glucose-Capillary: 103 mg/dL — ABNORMAL HIGH (ref 70–99)

## 2011-09-08 ENCOUNTER — Inpatient Hospital Stay (HOSPITAL_COMMUNITY): Payer: Medicare Other

## 2011-09-08 LAB — CBC
HCT: 33.3 % — ABNORMAL LOW (ref 36.0–46.0)
Hemoglobin: 11.1 g/dL — ABNORMAL LOW (ref 12.0–15.0)
WBC: 5.2 10*3/uL (ref 4.0–10.5)

## 2011-09-08 LAB — BASIC METABOLIC PANEL
BUN: 17 mg/dL (ref 6–23)
Chloride: 98 mEq/L (ref 96–112)
Glucose, Bld: 136 mg/dL — ABNORMAL HIGH (ref 70–99)
Potassium: 4 mEq/L (ref 3.5–5.1)

## 2011-09-08 LAB — GLUCOSE, CAPILLARY: Glucose-Capillary: 109 mg/dL — ABNORMAL HIGH (ref 70–99)

## 2011-09-08 NOTE — H&P (Signed)
NAMEDANNY, ZIMNY NO.:  [id number]  MEDICAL RECORD NO.:  [id number]  LOCATION:  4501                         FACILITY:  [facility]  PHYSICIAN:  Tarry Kos, MD       DATE OF BIRTH:  04-05-1934  DATE OF ADMISSION:  09/02/2011 DATE OF DISCHARGE:                             HISTORY & PHYSICAL   CHIEF COMPLAINT:  Nausea, vomiting, and weakness.  HISTORY OF PRESENT ILLNESS:  Ms. Alleyne is a 77 year old female with multiple medical issues including insulin-dependent diabetes, morbid obesity, chronic lower extremity lymphedema, chronic diastolic congestive heart failure, AFib, who presents to the hospital after being transferred from [facility] ED with 2-day history of nausea, vomiting, and not feeling very well.  She says that she has recently been sent home from the nursing home.  She recently had developed a wound to her left lower extremity.  She says that was all healed up. She was sent home.  She is going to [facility].  They released her and then today she comes in with a pretty bad wound on the dorsum of her right lower foot.  She says she has no idea how long it has been there.  She has [place] coming to her house frequently, but she says they do not check her feet, but she has been having this foot ulcer there and according to her, it had all healed up and now it is bad.  She is a very poor historian.  I am not really sure what is accurate.  She says she has been diagnosed with osteomyelitis in this foot before which is documented in her past medical record.  She was diagnosed with osteomyelitis in June 2012.  It looks like she did complete IV antibiotics for that at that time.  She has been seen in consultation by Dr. Ashok Norris group in the past.  Anyway, again she is a poor historian. She denies any fever.  She has been vomiting off and on for 2 days now. She has just been feeling overall sick.  Denies any cough.  Denies any dysuria  or problems with her urination.  REVIEW OF SYSTEMS:  Otherwise, negative.  PAST MEDICAL HISTORY: 1. Sleep apnea. 2. Hypertension. 3. Diabetes. 4. Severe pulmonary hypertension. 5. Cor pulmonale. 6. Chronic diastolic heart failure. 7. AFib, refused Coumadin in the past. 8. Morbid obesity. 9. Chronic lower extremity edema with chronic venous stasis. 10.Hypoventilation syndrome. 11.History of left foot ulcer, also with history of osteomyelitis in     the past that appears to have been treated already. 12.Hypertension. 13.History of renal failure. 14.History of cervical cancer status post hysterectomy and radiation. 15.Status post T and A.  ALLERGIES:  None.  SOCIAL HISTORY:  She is a nonsmoker.  No alcohol.  No IV drug abuse. She currently lives at home.  Has [facility].  She has recently been discharged from skilled nursing facility.  MEDICATIONS:  Xanax, vitamin D, hydrocodone, Tylenol, Cardizem CD 180 mg daily, Lasix 80 mg daily, Lantus, Lopressor, KCl, Zoloft.  PHYSICAL EXAMINATION:  VITAL SIGNS:  Temperature is 99.4, pulse 94, respirations 18, blood pressure 98/56. GENERAL:  She is alert  and oriented x4, morbid obese, in no apparent distress. HEENT:  Extraocular muscles intact.  Pupils equal and reactive to light. Oropharynx clear.  Mucous membranes moist. NECK:  No JVD.  No carotid bruits. CORONARY:  Regular rate and rhythm without murmurs, rubs, or gallops. CHEST:  Clear to auscultation bilaterally.  No wheeze, rhonchi, or rales. ABDOMEN:  Soft, nontender, nondistended.  Positive bowel sounds.  No hepatosplenomegaly. EXTREMITIES:  No clubbing or cyanosis.  She has got chronic venous stasis changes in bilateral lower extremities with 1+ pitting edema. She has got an ulcer at the bottom of her left foot that is at least 3-4 cm in diameter with dried eschar on top.  There is no surrounding erythema or purulent discharge. SKIN:  No rashes. PSYCHIATRIC:   Normal affect.  LABS:  Her urinalysis significant for UTI.  BNP 11000.  Cardiac enzymes negative.  Lipase normal.  LFTs normal.  Total bili 2.6, alk phos 194. Sodium 133.  BUN and creatinine normal.  Lactic acid level 2.3.  White count 18.6, hemoglobin 11.2.  X-ray shows a questionable osteomyelitis of the medial aspect of the second metacarpal head.  She is status post amputation of third digit with some soft tissue swelling.  Chest x-ray, early CHF.  No infiltrate.  ASSESSMENT AND PLAN:  A 77 year old female who presents with nausea and vomiting, who has urinary tract infection and possible recurrent osteomyelitis of the left lower extremity.  1. Urinary tract infection.  Urine culture has been sent off.  We will     place the patient on vancomycin and Zosyn. 2. Possible recurrent osteomyelitis with ulcer which is unclear     whether or not this is chronic or not in the left lower extremity.     I would get Orthopedic surgeon involved that had previously been     following this wound.  I am going to place her on vancomycin and     Zosyn at this time.  Really, unclear as to whether or not this is     progressive or what the followup was from her back in July 2012.     She says the wound totally healed, but I am not really sure if that     is accurate either.  It would probably be best also to get records     from [place] and the [facility] in the morning. 3. Chronic lower extremity swelling and venous stasis changes.  I am     going to check bilateral lower extremity ultrasounds to rule out     for deep vein thrombosis. 4. Morbid obesity, stable. 5. History of congestive heart failure.  This appears to be stable at     this time.  We will continue her home Lasix dose. 6. Clarify home medications and resume those.  Further recommendation     depending on overall hospital course.          ______________________________ Tarry Kos, MD     RD/MEDQ  D:  09/02/2011  T:   09/02/2011  Job:  [id number]  Electronically Signed by Tarry Kos MD on 09/08/2011 08:25:32 PM

## 2011-09-09 LAB — GLUCOSE, CAPILLARY: Glucose-Capillary: 77 mg/dL (ref 70–99)

## 2011-09-11 NOTE — Discharge Summary (Signed)
Tara Huff, Tara Huff NO.:  [id number]  MEDICAL RECORD NO.:  [id number]  LOCATION:  4501                         FACILITY:  [facility]  PHYSICIAN:  Tara Huff, MDDATE OF BIRTH:  Jun 06, 1934  DATE OF ADMISSION:  09/02/2011 DATE OF DISCHARGE:  09/08/2011                              DISCHARGE SUMMARY   DISCHARGE DIAGNOSES: 1. Nausea, vomiting, weakness secondary to urinary tract infection and     left foot osteomyelitis. 2. Left foot osteomyelitis with cellulitis which is acute on chronic. 3. Lymphedema bilaterally. 4. Acute on chronic diastolic congestive heart failure. 5. Hypertension. 6. Acute on chronic renal insufficiency.  The patient with stage I     chronic kidney disease. 7. Atrial fibrillation, which is chronic.  The patient not on     Coumadin. 8. Morbid obesity. 9. Physical deconditioning. 10.Sleep apnea/hypoventilation syndrome with secondary pulmonary     hypertension. 11.Diabetes mellitus type 2. 12.Anxiety/depression.  DISCHARGE MEDICATIONS: 1. Ciprofloxacin 500 mg 1 tablet by mouth twice a day. 2. Clonazepam 0.5 mg tablet by mouth every 12 hours. 3. Furosemide 40 mg 1 tablet by mouth twice a day. 4. Lantus 5 units subcutaneously daily at bedtime. 5. Lubriderm lotion 1 application topically twice daily. 6. Metronidazole 500 mg 1 tablet by mouth 3 times a day. 7. Vancomycin to be taken 1250 mg intravenously every 24 hours. 8. Hydrocodone 5/325 mg 1-2 tablets by mouth every 6 hours as needed     for pain. 9. Aspirin 81 mg 1 tablet by mouth daily. 10.Diltiazem extended release form 180 mg 1 capsule by mouth daily. 11.Docusate 100 mg 1 capsule by mouth twice a day. 12.Metoprolol tartrate 50 mg half tablet by mouth twice a day. 13.NovoLog 5 units subcutaneously 3 times a day before meals for CBGs     greater than 150. 14.Potassium 30 mEq 1 tablet by mouth twice a day. 15.Pramipexole 0.125 mg 1 tablet by mouth daily at  bedtime. 16.Tylenol Extra Strength 500 mg 1 tablet by mouth every 6 hours as     needed for pain. 17.Vitamin D 1000 units 1 tablet by mouth daily. 18.Zoloft 100 mg 1 tablet by mouth daily.  DISPOSITION AND FOLLOWUP:  The patient had been discharged in a stable and improved condition to [facility] for physical rehab.  The patient is going to see Dr. Lestine Huff, Orthopedics in about a week after being discharged from the hospital for further evaluation and definitive treatment on her left foot osteomyelitis. Instructions has been given for twice a day soaking bath over left foot into Dial soap water and she is going to be also on antibiotics for 6 weeks total using IV vancomycin 1250 mg daily, and also on ciprofloxacin 500 mg twice a day, and Flagyl 500 mg 3 times a day.  PROCEDURE PERFORMED DURING THIS HOSPITALIZATION:  The patient had a chest x-ray two views on September 01, 2011, that demonstrated a stable cardiomegaly with increased pulmonary vascular congestion suggesting early congestive heart failure.  Probably a small bilateral pleural effusion also appreciated.  There is minimal bibasilar airspace disease likely reflecting atelectasis.  An x-ray of the left foot, three views demonstrated osseous irregularity about  the medial aspect of the second metacarpal head.  New from September 09, 2010.  An MRI in May 2012 describes osteomyelitis in this area.  Therefore, acuity cannot be entirely evaluated.  Followup MRI should be considered.  An MRI of the left foot done on September 01, 2011, demonstrated: 1. Osteomyelitis of the head of the third metatarsal with surrounding     cellulitis and possible draining ulcer. 2. Fracture of osteomyelitis of the head of the second metatarsal     potentially with a low level osteomyelitis in the base of the     proximal phalanx of the second toe. 3. All procedures are severely degenerated by motion artifact     degenerative  arthropathy at the first tarsometatarsal articulation     is appreciated.  There is a dorsal subcutaneous edema and     enhancement compatible with cellulitis.  The patient also had a chest x-ray done on September 08, 2011, that was done in order to evaluate placement of a PICC line.  No pneumothorax appreciated and PICC line is in the right position.  HISTORY OF PRESENT ILLNESS:  For full details, please refer to dictation done by Dr. Onalee Hua on September 02, 2011, but briefly Tara Huff is a 77- year-old female with multiple medical issues including insulin-dependent diabetes, morbid obesity, chronic lower extremity lymphedema, chronic diastolic congestive heart failure, atrial fibrillation, and hypertension who presented to the hospital after being transferred from [facility] ED with a 2-day history of nausea, vomiting, and not feeling very well.  The patient states that she has recently been sent home from the nursing home that she was residing.  She recently had developed a wound on her left lower extremity.  She said that it was all healed up and because of that she was sent home after finishing some rehab.  She goes to the outpatient wound care.  They released her and then today she comes in with a pretty bad wound on the dorsum of her left lower foot.  She said that she has no idea how long it has been there and she has [place] coming out to her house frequently, but she said that they do not check her feet.  She has been having this foot ulcer according to her for the last 3 months or so.  It.  The patient reports that she has been diagnosed with osteomyelitis in her food before, which is documented in her past medical records.  She was diagnosed with osteomyelitis in June 2012.  She completed IV antibiotics at that time, and she was supposed to follow with Dr. Lestine Huff group as an outpatient.  Once the patient went into the nursing home, she never followed and following at  the wound care by Tara Huff who after multiple hyperbaric chamber treatments and p.o. antibiotics was unable to heal the patient's osteomyelitis.  The patient also reports having increased frequency and some discomfort when she pees.  She denies any fever, denies any chills or any other complaints.  Pertinent laboratory data throughout this hospitalizations includes lipase of 7.  Lactic acid 2.3.  CBC with white blood cells of 18.6, hemoglobin 11.2, platelet 209.  Comprehensive metabolic panel with a sodium of 133, potassium 4.6, chloride 100, bicarb 20, glucose 107, BUN 27 creatinine 1.20 with a GFR of 43.  BNP was 11,323.  TSH 2.280. Hemoglobin A1c 6.8.  Erythrocyte sedimentation rate was 79.  CRP 3.22. At discharge, the patient had a vancomycin level of  17.5.  CBC with white blood cells of 5.2, hemoglobin 11.1, platelets 239.  Basic metabolic panel with a sodium of 134, potassium 4.0, chloride 98, bicarb 28, glucose 136, BUN 17, creatinine 0.99 with a calcium of 9.0.  HOSPITAL COURSE BY PROBLEM: 1. Nausea, vomiting, and weakness secondary to UTI and a left foot     osteomyelitis.  The patient received broad-spectrum IV antibiotics     using vancomycin and Zosyn on the moment of admission until the day     prior to discharge where her antibiotic has been narrowed to just     vancomycin IV and the use of ciprofloxacin and Flagyl.  She is     currently not complaining of any nausea, vomiting, abdominal pain,     increased frequency, or dysuria, and at this point, her wound is     looking pretty good.  She is going to follow with Dr. Lestine Huff as an     outpatient in 1 week after being discharged and will continue     receiving her antibiotic therapy and treatment to complete 6 weeks     total.  Vancomycin levels need to be done at least weekly and     report level to Dr. Lestine Huff.  We are looking for the patient to be     15-20 range for a good antibiotic level on her system. 2. Left  foot osteomyelitis and cellulitis.  As per problem number 1,     the patient is going to be for 6 weeks on IV antibiotics.     Antibiotic has been discussed with Dr. Orvan Falconer from the ID Service     and with this we are covering Pseudomonas, negative microorganisms,     and also MRSA.  The patient will follow for definitive treatment     with Dr. Lestine Huff as an outpatient. 3. Bilateral lymphedema chronic.  At this point, we will continue the     use of diuretics. 4. Acute on chronic diastolic congestive heart failure, currently     compensated.  The patient will continue her medications and will     follow as an outpatient by her cardiologist for further adjustment. 5. Hypertension, well controlled.  No changes have been made to her     medications. 6. Acute renal insufficiency most likely a combination of dehydration     with decreased perfusion with her acute on chronic diastolic     congestive heart failure.  Once her CHF was compensated, the     patient's symptoms and kidney function returned back to normal. 7. Atrial fibrillation, chronic, not on Coumadin.  At this point, rate     controlled.  She will continue using current medications. 8. Morbid obesity.  The patient was advised to follow a low-calorie     diet. 9. Physical deconditioning.  She is going to be transferred to [facility] for physical rehab. 10.Sleep apnea/hypoventilation syndrome.  The patient will be     evaluated by primary care physician and determine the use of CPAP.     At this point, she uses sometimes oxygen when she is sleeping at     night, but at this point, she is not complaining of any symptoms. 11.Diabetes.  We are going to continue the use of Lantus 5 units     subcutaneously daily, and also 5 units 3 times a day of NovoLog for     meal coverage. 12.Anxiety and depression.  We  are going to continue the use of     Klonopin and sertraline.  The rest of her medical problems remained  stable and no changes have been made to her medication regimen.  PHYSICAL EXAMINATION:  VITAL SIGNS:  At discharge, temperature 97.1, heart rate 91, respiratory rate 22, blood pressure 122/84, oxygen saturation 92% on room air. GENERAL:  The patient was in no acute distress. RESPIRATORY:  Clear to auscultation bilaterally. HEART:  Irregularly irregular.  Positive systolic murmur. ABDOMEN:  Soft, nontender, obese.  Positive bowel sounds. EXTREMITIES:  2 to 3+ edema bilaterally.  Some chronic venous stasis changes and a left plantar foot with callosity and also mild drainage coming from the ulcer and osteomyelitis that she had there. NEUROLOGIC:  Nonfocal.     Tara Randy, MD     CEM/MEDQ  D:  09/08/2011  T:  09/08/2011  Job:  [id number]  cc:   Dario Guardian, M.D. Leonides Grills, M.D.  Electronically Signed by Vassie Loll MD on 09/11/2011 10:45:44 PM

## 2011-09-11 NOTE — Consult Note (Signed)
  NAMESH[place]WA, [place] NO.:  [id number]  MEDICAL RECORD NO.:  [id number]  LOCATION:  4501                         FACILITY:  [facility]  PHYSICIAN:  Jene Every, M.D.    DATE OF BIRTH:  Oct 20, 1934  DATE OF CONSULTATION:  09/04/2011 DATE OF DISCHARGE:                                CONSULTATION   ADDENDUM  The MRI was obtained.  I reviewed that MRI.  She has osteomyelitis of the third metatarsal head, possible osteomyelitis of the second metatarsal head, and everything severely degenerated by motion artifact, some edema, and possible cellulitis.  Possible draining ulcer.  Discussed this with Dr. Fonnie Jarvis, is currently out of town.  I feel this would be best evaluated by him on Monday and then, he can decide whether there is any further procedure necessary.  In the interim, continue the IV antibiotics and local wound care.     Jene Every, M.D.     Cordelia Pen  D:  09/04/2011  T:  09/05/2011  Job:  [id number]  Electronically Signed by Jene Every M.D. on 09/11/2011 04:37:19 PM

## 2011-09-11 NOTE — Discharge Summary (Signed)
  Tara Huff, Tara Huff NO.:  [id number]  MEDICAL RECORD NO.:  [id number]  LOCATION:                                 FACILITY:  PHYSICIAN:  Pleas Koch, MD        DATE OF BIRTH:  [birth year]  DATE OF ADMISSION:  09/02/2011 DATE OF DISCHARGE:  09/09/2011                              DISCHARGE SUMMARY   ADDENDUM  The patient stayed at [facility] for 1 more day.  She had no acute issues overnight.  The patient did complain of mild pain at the site of the PICC line placement that was done on September 08, 2011, and the patient can take p.r.n. Tylenol for pain with regard to this.  This will be added to prescriptions.  She had no other issues.  No chest pain.  No shortness of breath.  No nausea.  No vomiting.  VITAL SIGNS:  Temperature 98.7, blood pressure 100-123/66-79, pulse 78, respirations 20, 94% on room air. CHEST:  Clinically clear.  Arcus senilis.  S1 and S2.  No murmurs. ABDOMEN:  Soft, nontender. EXTREMITIES:  Left lower extremity showed plantar ulcer with significant eschar over the sole of foot at the bases of 2nd, 3rd, and 4th metatarsals, confluence in about 5 cm in size, round but clean with no erythema, no pus, and nonpainful to touch.  The patient will be discharged to the nursing home later today.          ______________________________ Pleas Koch, MD     JS/MEDQ  D:  09/09/2011  T:  09/09/2011  Job:  [id number]  Electronically Signed by Pleas Koch MD on 09/11/2011 09:10:19 PM

## 2012-02-29 IMAGING — CT CT ABD-PELV W/ CM
2 of 5 series · 14 of 32 positions shown, 19 images · IV contrast (water & 100ml omni 300)
Comparison: Ultrasound 09/19/2005.

CLINICAL DATA: Abdominal pain.  Diarrhea.  Sepsis.

CT ABDOMEN AND PELVIS WITH CONTRAST
TECHNIQUE: Multidetector CT imaging of the abdomen and pelvis was
performed following the standard protocol during bolus
administration of intravenous contrast.
Contrast: 100 ml 8mnipaque-SGG IV

[Series 2: routine abdomen · axial · 0.98mm/px · z∈[-433,-78]mm · 6 of 101 slices shown, 11 images]
[im 15/101  soft-tissue]
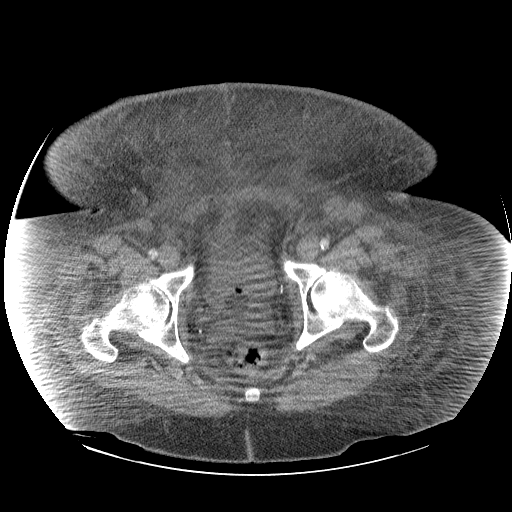
[im 15/101  bone]
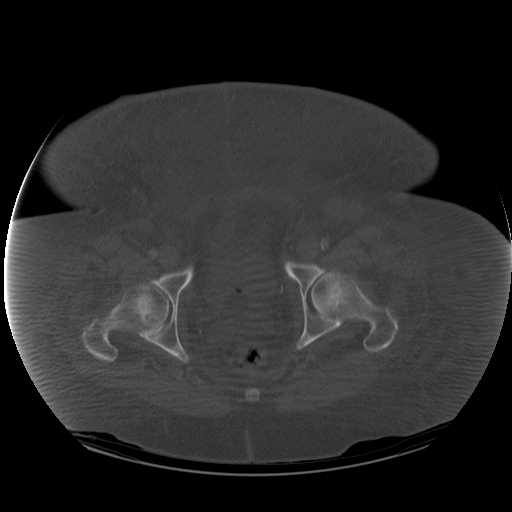
[im 29/101  soft-tissue]
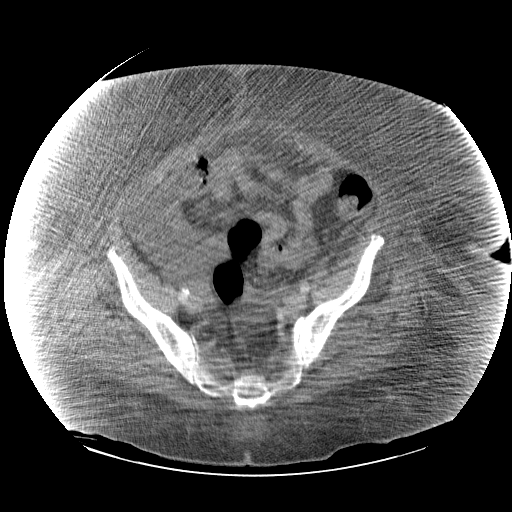
[im 43/101  soft-tissue]
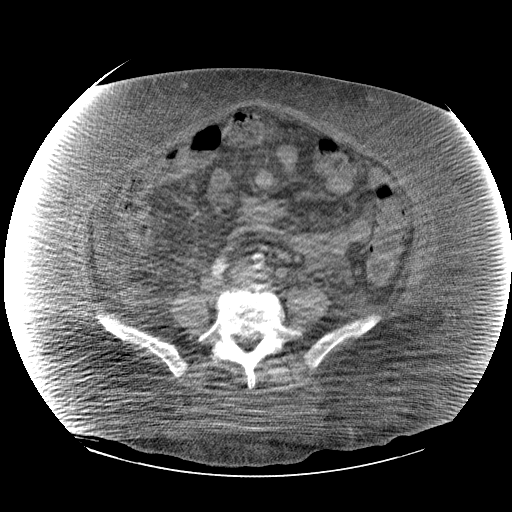
[im 43/101  lung]
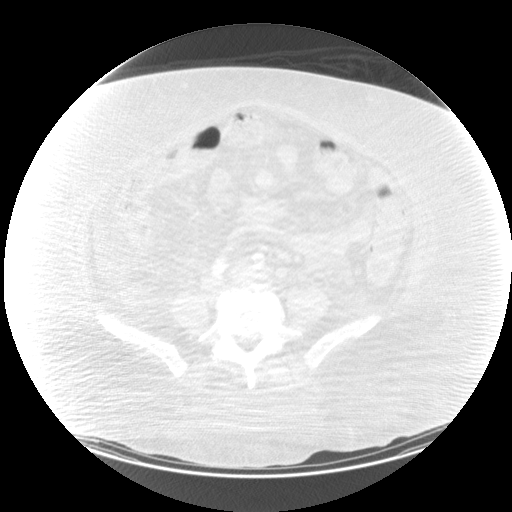
[im 58/101  soft-tissue]
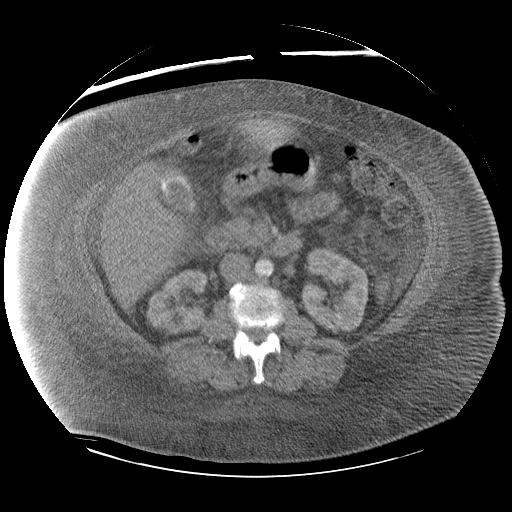
[im 58/101  lung]
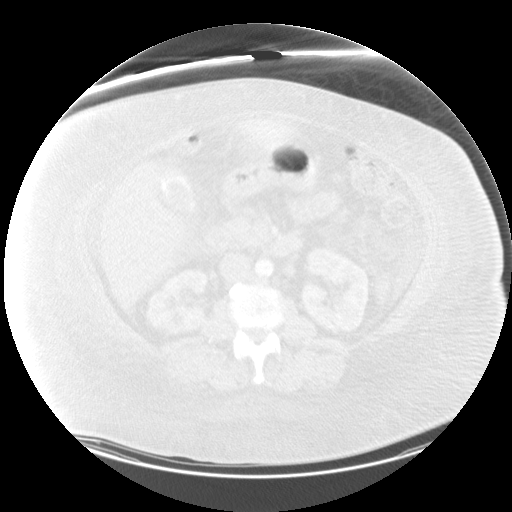
[im 72/101  soft-tissue]
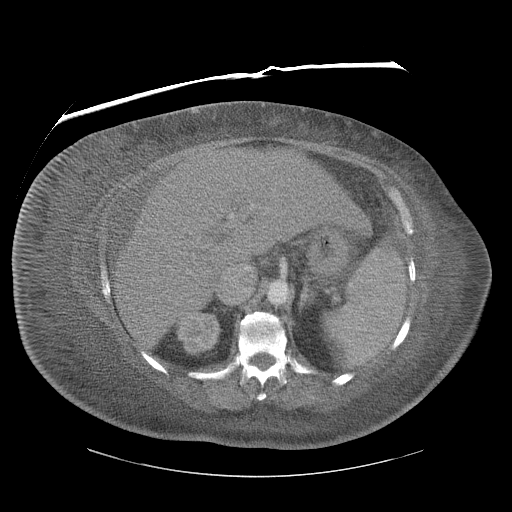
[im 72/101  lung]
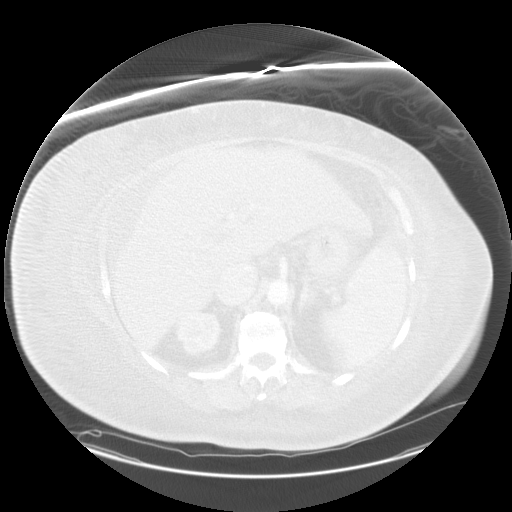
[im 86/101  soft-tissue]
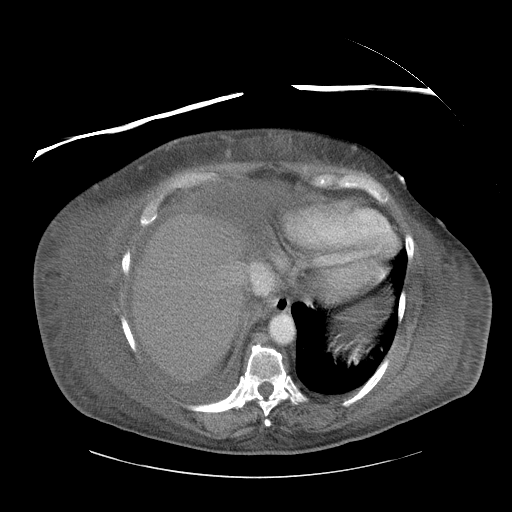
[im 86/101  lung]
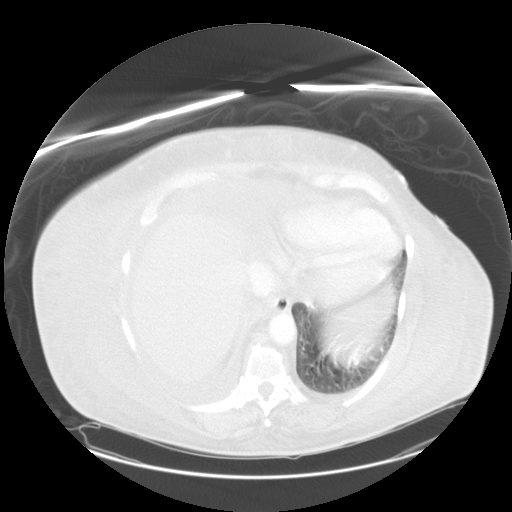

[Series 400: sag · sagittal · 1.00mm/px · 8 of 146 slices shown]
[im 15/146  soft-tissue]
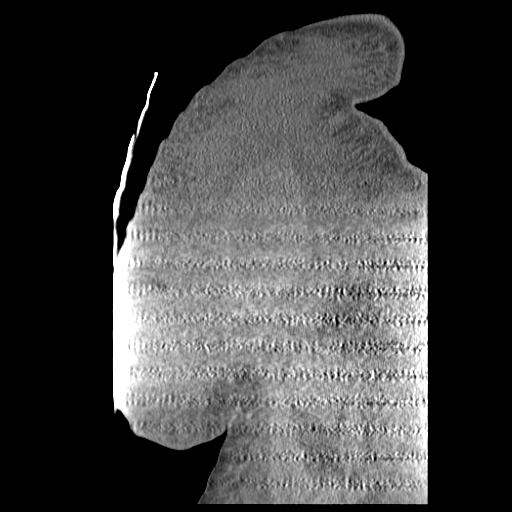
[im 30/146  soft-tissue]
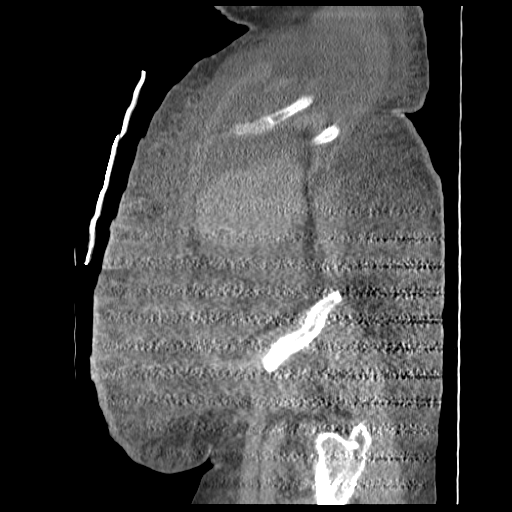
[im 44/146  soft-tissue]
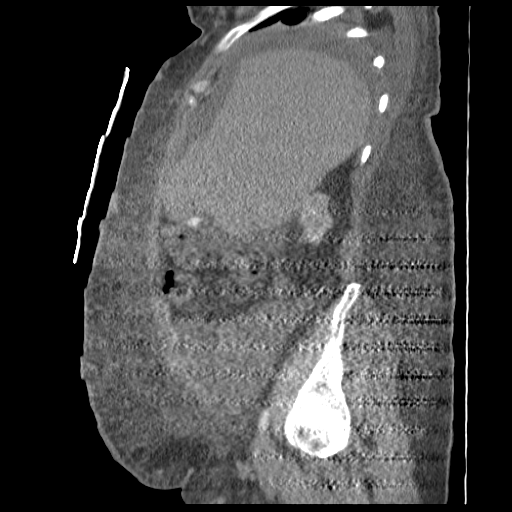
[im 59/146  soft-tissue]
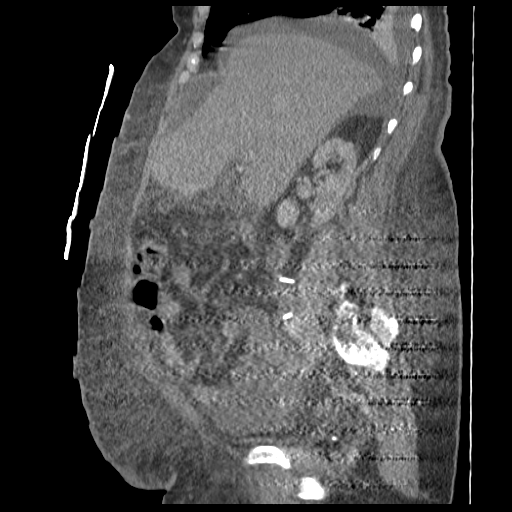
[im 88/146  soft-tissue]
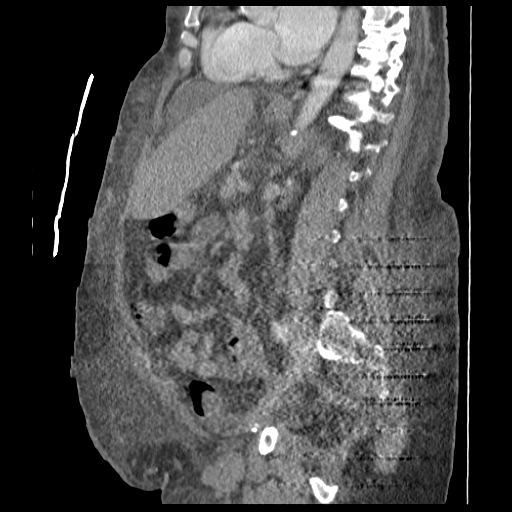
[im 102/146  soft-tissue]
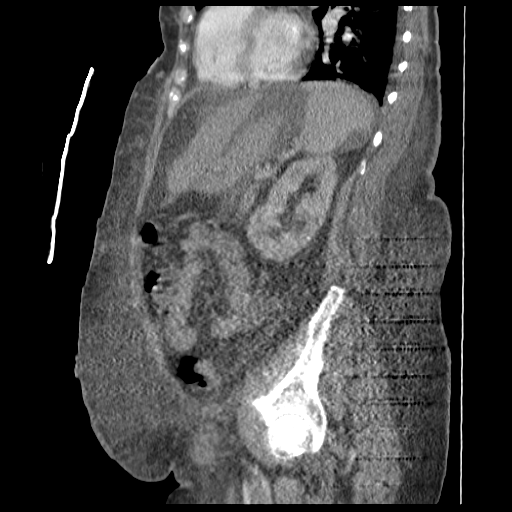
[im 117/146  soft-tissue]
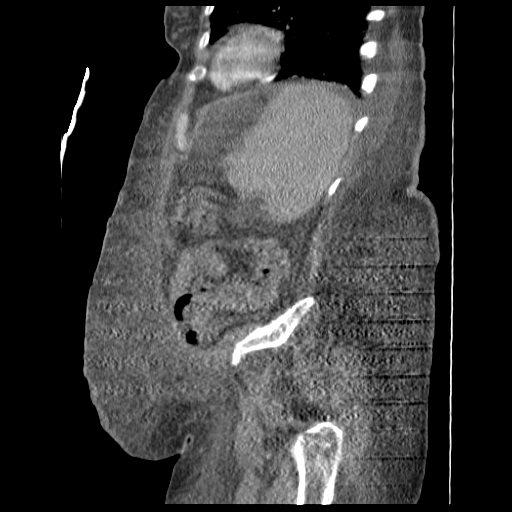
[im 131/146  soft-tissue]
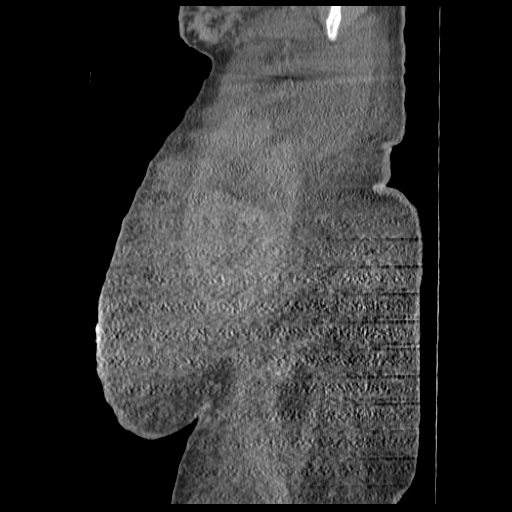

[14 of 32 positions shown; findings below may reference images not displayed]

FINDINGS: Image quality degraded by morbid obesity.

Moderate ascites.  Left lobe of liver is enlarged.  Question
cirrhosis.  No liver mass.

Calcification in the wall of gallbladder, compatible with chronic
cholecystitis.  There is increased risk of gallbladder carcinoma
with porcelain gallbladder.  No mass lesion is seen.  Spleen is not
enlarged.  Kidneys show no obstruction or mass.  Normal pancreas.

Negative for bowel obstruction or bowel thickening.  Sigmoid
diverticulosis is present.  There is moderate ascites in the pelvis
and there is anasarca present with diffuse subcutaneous edema.

Bilateral pleural effusions, right greater than left.  Right lower
lobe atelectasis.
IMPRESSION: Moderate ascites.  Question hepatocellular disease such as
cirrhosis.

Porcelain gallbladder consistent with chronic cholelithiasis and
chronic cholecystitis.  Increased risk of gallbladder cancer with
porcelain gallbladder however no gallbladder mass is seen.

Negative for bowel obstruction.

## 2012-03-03 IMAGING — US US PARACENTESIS
1 series · 10 of 10 positions shown · non-contrast
Comparison: CT scan performed 09/08/2010.

CLINICAL DATA: Perihepatic ascites of uncertain etiology.  Request
has been made for diagnostic and therapeutic paracentesis.

ULTRASOUND GUIDED PARACENTESIS

[Series 1: us paracentesis · 0.35mm/px · 10 of 10 slices shown]
[im 1/10]
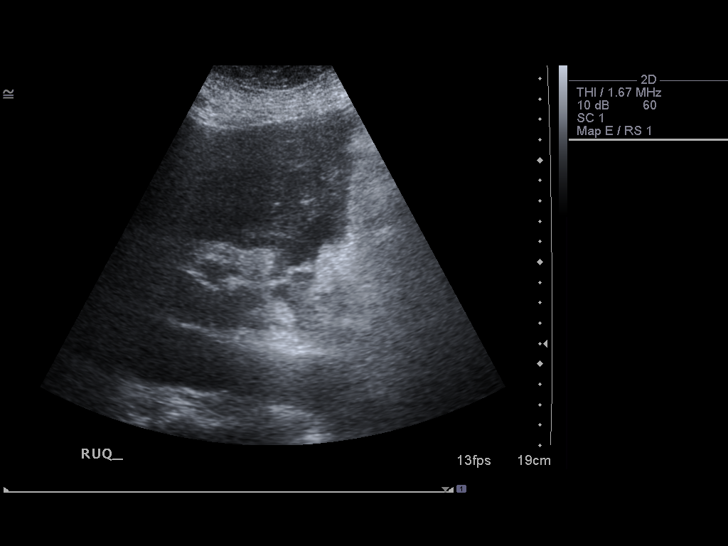
[im 2/10]
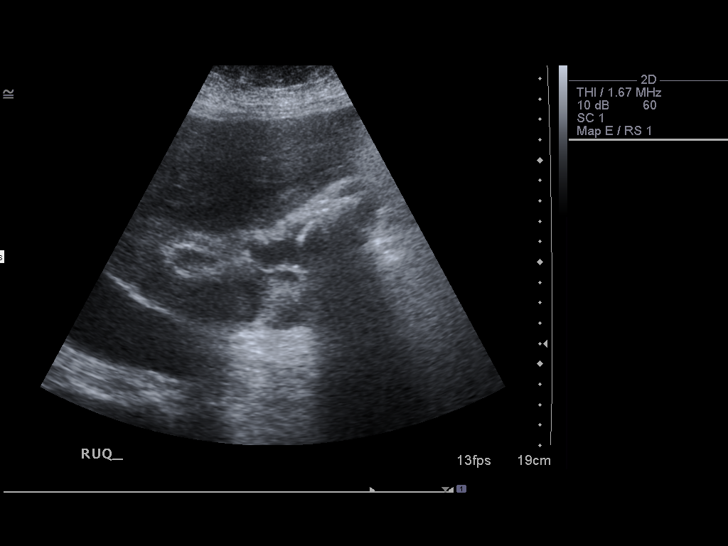
[im 3/10]
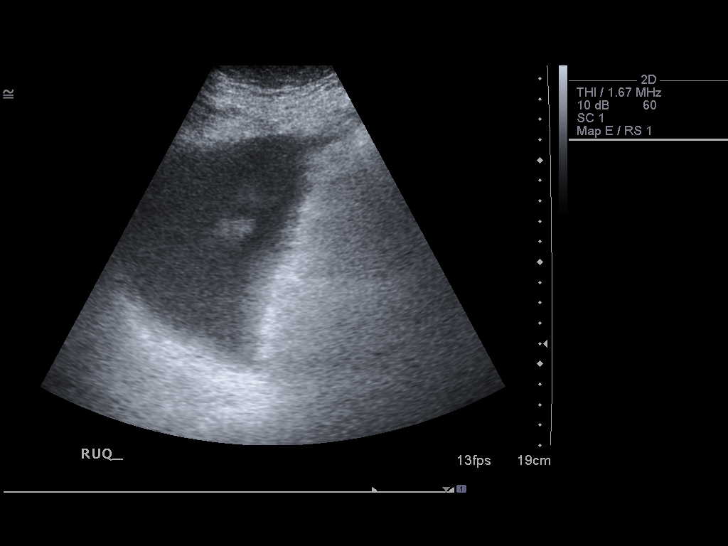
[im 4/10]
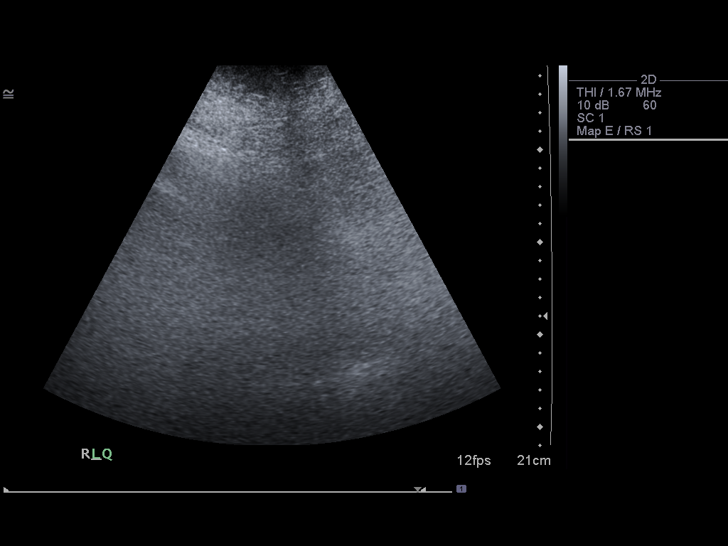
[im 5/10]
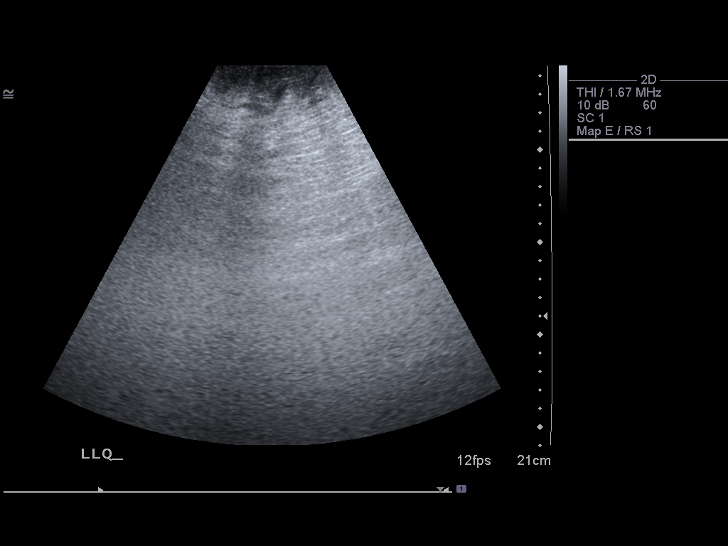
[im 6/10]
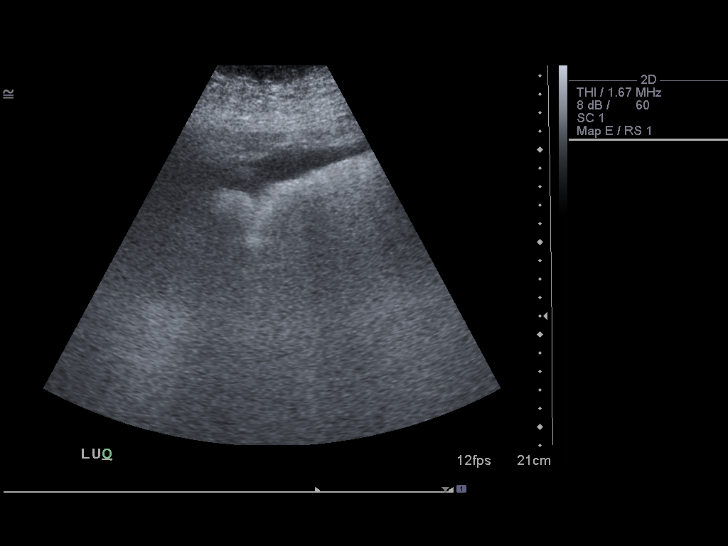
[im 7/10]
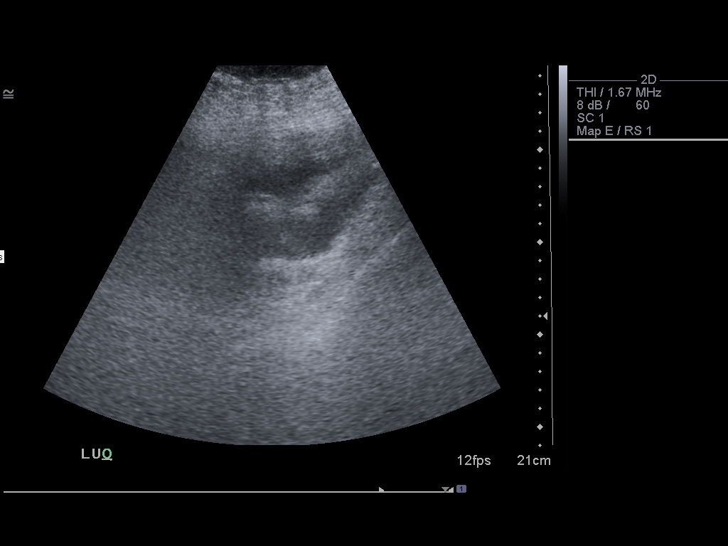
[im 8/10]
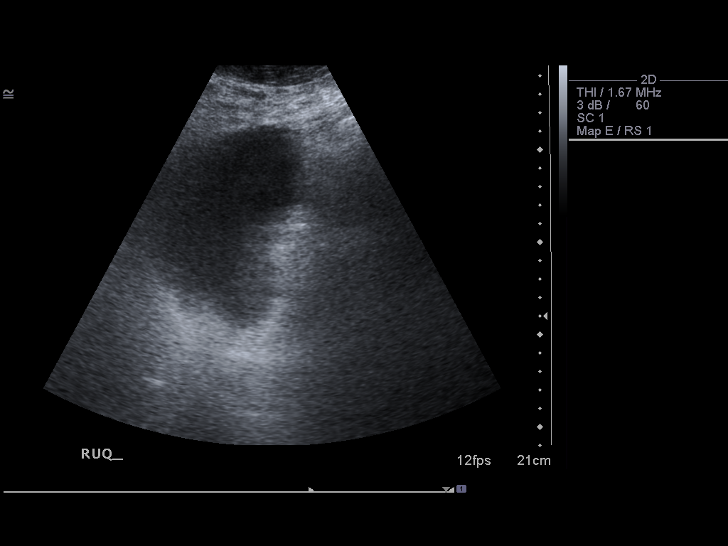
[im 9/10]
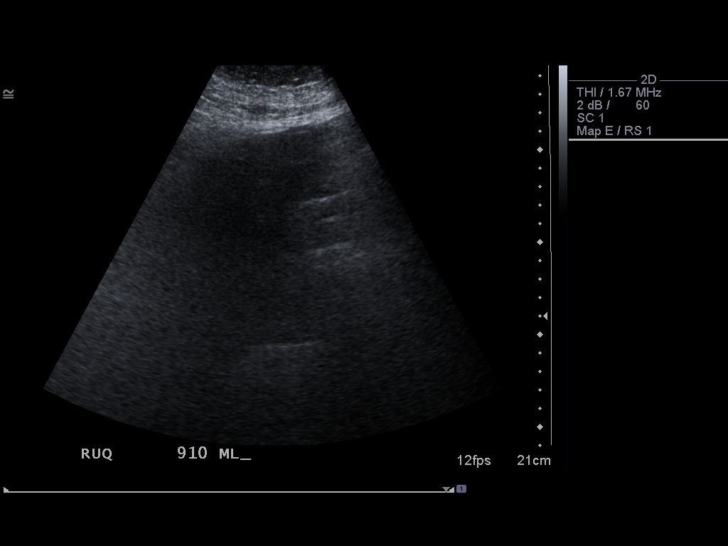
[im 10/10]
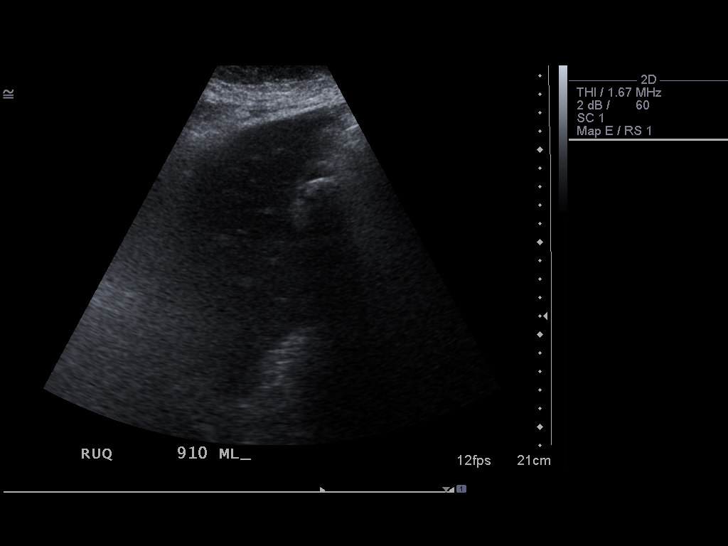

[10 of 10 positions shown; findings below may reference images not displayed]

An ultrasound guided paracentesis was thoroughly discussed with the
patient and questions answered.  The benefits, risks, alternatives
and complications were also discussed.  The patient understands and
wishes to proceed with the procedure.  Written consent was
obtained.

Ultrasound was performed to localize and mark an adequate pocket of
fluid in the right upper quadrant of the abdomen.  The area was
then prepped and draped in the normal sterile fashion.  1%
Lidocaine was used for local anesthesia.  Under ultrasound guidance
a 19 gauge Yueh catheter was introduced.  Paracentesis was
performed.  The catheter was removed and a dressing applied.

Complications:  None
FINDINGS: A total of approximately 910 ml of amber-colored fluid
was removed.  A fluid sample was sent for laboratory analysis.
IMPRESSION: Successful ultrasound guided paracentesis yielding 910 ml of
ascites.

Read by: Baliza, Ccl.-LIPA MAMANI

## 2012-03-19 ENCOUNTER — Encounter (HOSPITAL_COMMUNITY): Payer: Self-pay

## 2012-03-19 ENCOUNTER — Inpatient Hospital Stay (HOSPITAL_COMMUNITY)
Admission: EM | Admit: 2012-03-19 | Discharge: 2012-03-24 | DRG: 194 | Disposition: A | Discharge status: Skilled Nursing Facility | Payer: Medicare Other | Attending: Internal Medicine | Admitting: Internal Medicine

## 2012-03-19 ENCOUNTER — Emergency Department (HOSPITAL_COMMUNITY): Payer: Medicare Other

## 2012-03-19 DIAGNOSIS — R21 Rash and other nonspecific skin eruption: Secondary | ICD-10-CM | POA: Diagnosis present

## 2012-03-19 DIAGNOSIS — J040 Acute laryngitis: Secondary | ICD-10-CM

## 2012-03-19 DIAGNOSIS — I4891 Unspecified atrial fibrillation: Secondary | ICD-10-CM

## 2012-03-19 DIAGNOSIS — F411 Generalized anxiety disorder: Secondary | ICD-10-CM

## 2012-03-19 DIAGNOSIS — E782 Mixed hyperlipidemia: Secondary | ICD-10-CM

## 2012-03-19 DIAGNOSIS — A498 Other bacterial infections of unspecified site: Secondary | ICD-10-CM | POA: Diagnosis present

## 2012-03-19 DIAGNOSIS — C549 Malignant neoplasm of corpus uteri, unspecified: Secondary | ICD-10-CM

## 2012-03-19 DIAGNOSIS — R059 Cough, unspecified: Secondary | ICD-10-CM

## 2012-03-19 DIAGNOSIS — N390 Urinary tract infection, site not specified: Secondary | ICD-10-CM

## 2012-03-19 DIAGNOSIS — I509 Heart failure, unspecified: Secondary | ICD-10-CM

## 2012-03-19 DIAGNOSIS — G4733 Obstructive sleep apnea (adult) (pediatric): Secondary | ICD-10-CM

## 2012-03-19 DIAGNOSIS — E119 Type 2 diabetes mellitus without complications: Secondary | ICD-10-CM

## 2012-03-19 DIAGNOSIS — J189 Pneumonia, unspecified organism: Principal | ICD-10-CM

## 2012-03-19 DIAGNOSIS — I119 Hypertensive heart disease without heart failure: Secondary | ICD-10-CM

## 2012-03-19 DIAGNOSIS — B373 Candidiasis of vulva and vagina: Secondary | ICD-10-CM | POA: Diagnosis present

## 2012-03-19 DIAGNOSIS — I2789 Other specified pulmonary heart diseases: Secondary | ICD-10-CM

## 2012-03-19 DIAGNOSIS — B3731 Acute candidiasis of vulva and vagina: Secondary | ICD-10-CM | POA: Diagnosis present

## 2012-03-19 DIAGNOSIS — R0602 Shortness of breath: Secondary | ICD-10-CM

## 2012-03-19 DIAGNOSIS — M869 Osteomyelitis, unspecified: Secondary | ICD-10-CM

## 2012-03-19 DIAGNOSIS — R05 Cough: Secondary | ICD-10-CM

## 2012-03-19 DIAGNOSIS — Z6841 Body Mass Index (BMI) 40.0 and over, adult: Secondary | ICD-10-CM

## 2012-03-19 DIAGNOSIS — E7800 Pure hypercholesterolemia, unspecified: Secondary | ICD-10-CM

## 2012-03-19 DIAGNOSIS — J159 Unspecified bacterial pneumonia: Secondary | ICD-10-CM

## 2012-03-19 LAB — GLUCOSE, CAPILLARY: Glucose-Capillary: 162 mg/dL — ABNORMAL HIGH (ref 70–99)

## 2012-03-19 LAB — CARDIAC PANEL(CRET KIN+CKTOT+MB+TROPI)
Relative Index: INVALID (ref 0.0–2.5)
Total CK: 78 U/L (ref 7–177)

## 2012-03-19 LAB — COMPREHENSIVE METABOLIC PANEL
AST: 54 U/L — ABNORMAL HIGH (ref 0–37)
BUN: 19 mg/dL (ref 6–23)
CO2: 22 mEq/L (ref 19–32)
Chloride: 100 mEq/L (ref 96–112)
Creatinine, Ser: 1.01 mg/dL (ref 0.50–1.10)
GFR calc non Af Amer: 53 mL/min — ABNORMAL LOW (ref >90)
Glucose, Bld: 115 mg/dL — ABNORMAL HIGH (ref 70–99)
Total Bilirubin: 1.4 mg/dL — ABNORMAL HIGH (ref 0.3–1.2)

## 2012-03-19 LAB — DIFFERENTIAL
Basophils Absolute: 0 10*3/uL (ref 0.0–0.1)
Lymphocytes Relative: 5 % — ABNORMAL LOW (ref 12–46)
Lymphs Abs: 0.5 10*3/uL — ABNORMAL LOW (ref 0.7–4.0)
Monocytes Absolute: 1 10*3/uL (ref 0.1–1.0)
Monocytes Relative: 9 % (ref 3–12)
Neutro Abs: 9.4 10*3/uL — ABNORMAL HIGH (ref 1.7–7.7)

## 2012-03-19 LAB — URINALYSIS, ROUTINE W REFLEX MICROSCOPIC
Nitrite: POSITIVE — AB
Protein, ur: NEGATIVE mg/dL

## 2012-03-19 LAB — URINE MICROSCOPIC-ADD ON

## 2012-03-19 LAB — PROTIME-INR: INR: 1.36 (ref 0.00–1.49)

## 2012-03-19 LAB — CBC
HCT: 41.9 % (ref 36.0–46.0)
Hemoglobin: 14.4 g/dL (ref 12.0–15.0)
RBC: 4.57 MIL/uL (ref 3.87–5.11)
WBC: 10.9 10*3/uL — ABNORMAL HIGH (ref 4.0–10.5)

## 2012-03-19 MED ORDER — DEXTROSE 5 % IV SOLN
1.0000 g | INTRAVENOUS | Status: DC
  Administered 2012-03-19: 1 g via INTRAVENOUS
  Filled 2012-03-19: qty 10

## 2012-03-19 MED ORDER — AZITHROMYCIN 500 MG IV SOLR
500.0000 mg | INTRAVENOUS | Status: DC
  Filled 2012-03-19: qty 500

## 2012-03-19 MED ORDER — SODIUM CHLORIDE 0.9 % IJ SOLN: 3.0000 mL | INTRAMUSCULAR | Status: DC | PRN

## 2012-03-19 MED ORDER — ACETAMINOPHEN 325 MG PO TABS
650.0000 mg | ORAL_TABLET | Freq: Four times a day (QID) | ORAL | Status: DC | PRN
  Administered 2012-03-21 – 2012-03-23 (×2): 650 mg via ORAL
  Filled 2012-03-19 (×2): qty 2

## 2012-03-19 MED ORDER — MORPHINE SULFATE 2 MG/ML IJ SOLN: 1.0000 mg | INTRAMUSCULAR | Status: DC | PRN

## 2012-03-19 MED ORDER — ACETAMINOPHEN 650 MG RE SUPP: 650.0000 mg | Freq: Four times a day (QID) | RECTAL | Status: DC | PRN

## 2012-03-19 MED ORDER — DILTIAZEM HCL 25 MG/5ML IV SOLN
20.0000 mg | Freq: Once | INTRAVENOUS | Status: AC
  Administered 2012-03-19: 20 mg via INTRAVENOUS
  Filled 2012-03-19: qty 5

## 2012-03-19 MED ORDER — SERTRALINE HCL 100 MG PO TABS
100.0000 mg | ORAL_TABLET | Freq: Every day | ORAL | Status: DC
  Administered 2012-03-19 – 2012-03-23 (×5): 100 mg via ORAL
  Filled 2012-03-19 (×6): qty 1

## 2012-03-19 MED ORDER — FUROSEMIDE 10 MG/ML IJ SOLN
80.0000 mg | Freq: Once | INTRAMUSCULAR | Status: AC
  Administered 2012-03-19: 80 mg via INTRAVENOUS
  Filled 2012-03-19: qty 8

## 2012-03-19 MED ORDER — ENOXAPARIN SODIUM 40 MG/0.4ML ~~LOC~~ SOLN
40.0000 mg | SUBCUTANEOUS | Status: DC
  Administered 2012-03-19 – 2012-03-23 (×5): 40 mg via SUBCUTANEOUS
  Filled 2012-03-19 (×6): qty 0.4

## 2012-03-19 MED ORDER — ONDANSETRON HCL 4 MG/2ML IJ SOLN
4.0000 mg | Freq: Once | INTRAMUSCULAR | Status: AC
  Administered 2012-03-19: 4 mg via INTRAVENOUS
  Filled 2012-03-19: qty 2

## 2012-03-19 MED ORDER — LEVALBUTEROL HCL 0.63 MG/3ML IN NEBU
0.6300 mg | INHALATION_SOLUTION | Freq: Four times a day (QID) | RESPIRATORY_TRACT | Status: DC | PRN
  Filled 2012-03-19: qty 3

## 2012-03-19 MED ORDER — ALPRAZOLAM 0.25 MG PO TABS
0.2500 mg | ORAL_TABLET | Freq: Every day | ORAL | Status: DC | PRN
  Administered 2012-03-19 – 2012-03-23 (×4): 0.25 mg via ORAL
  Filled 2012-03-19 (×4): qty 1

## 2012-03-19 MED ORDER — MENTHOL 3 MG MT LOZG: 1.0000 | LOZENGE | OROMUCOSAL | Status: DC | PRN

## 2012-03-19 MED ORDER — ONDANSETRON HCL 4 MG/2ML IJ SOLN: 4.0000 mg | Freq: Four times a day (QID) | INTRAMUSCULAR | Status: DC | PRN

## 2012-03-19 MED ORDER — DEXTROSE 5 % IV SOLN
1.0000 g | INTRAVENOUS | Status: DC
  Administered 2012-03-20 – 2012-03-22 (×3): 1 g via INTRAVENOUS
  Filled 2012-03-19 (×4): qty 10

## 2012-03-19 MED ORDER — ASPIRIN EC 325 MG PO TBEC
325.0000 mg | DELAYED_RELEASE_TABLET | Freq: Every day | ORAL | Status: DC
  Administered 2012-03-19 – 2012-03-24 (×6): 325 mg via ORAL
  Filled 2012-03-19 (×6): qty 1

## 2012-03-19 MED ORDER — FUROSEMIDE 80 MG PO TABS
80.0000 mg | ORAL_TABLET | Freq: Two times a day (BID) | ORAL | Status: DC
  Administered 2012-03-20 – 2012-03-24 (×9): 80 mg via ORAL
  Filled 2012-03-19 (×10): qty 1

## 2012-03-19 MED ORDER — SODIUM CHLORIDE 0.9 % IJ SOLN
3.0000 mL | Freq: Two times a day (BID) | INTRAMUSCULAR | Status: DC
  Administered 2012-03-20 – 2012-03-24 (×9): 3 mL via INTRAVENOUS

## 2012-03-19 MED ORDER — VITAMIN D3 25 MCG (1000 UNIT) PO TABS
1000.0000 [IU] | ORAL_TABLET | Freq: Every day | ORAL | Status: DC
  Administered 2012-03-19 – 2012-03-24 (×6): 1000 [IU] via ORAL
  Filled 2012-03-19 (×6): qty 1

## 2012-03-19 MED ORDER — METOPROLOL TARTRATE 25 MG PO TABS
25.0000 mg | ORAL_TABLET | Freq: Two times a day (BID) | ORAL | Status: DC
  Administered 2012-03-19 – 2012-03-24 (×10): 25 mg via ORAL
  Filled 2012-03-19 (×11): qty 1

## 2012-03-19 MED ORDER — INSULIN ASPART 100 UNIT/ML ~~LOC~~ SOLN
0.0000 [IU] | Freq: Three times a day (TID) | SUBCUTANEOUS | Status: DC
  Administered 2012-03-23: 2 [IU] via SUBCUTANEOUS
  Administered 2012-03-23: 1 [IU] via SUBCUTANEOUS

## 2012-03-19 MED ORDER — POTASSIUM CHLORIDE CRYS ER 20 MEQ PO TBCR
20.0000 meq | EXTENDED_RELEASE_TABLET | Freq: Two times a day (BID) | ORAL | Status: DC
  Administered 2012-03-19 – 2012-03-24 (×10): 20 meq via ORAL
  Filled 2012-03-19 (×12): qty 1

## 2012-03-19 MED ORDER — SODIUM CHLORIDE 0.9 % IV SOLN: 250.0000 mL | INTRAVENOUS | Status: DC | PRN

## 2012-03-19 MED ORDER — ONDANSETRON HCL 4 MG PO TABS: 4.0000 mg | ORAL_TABLET | Freq: Four times a day (QID) | ORAL | Status: DC | PRN

## 2012-03-19 MED ORDER — GUAIFENESIN-DM 100-10 MG/5ML PO SYRP
5.0000 mL | ORAL_SOLUTION | ORAL | Status: DC | PRN
  Administered 2012-03-20 – 2012-03-23 (×7): 5 mL via ORAL
  Filled 2012-03-19 (×9): qty 10

## 2012-03-19 MED ORDER — ACETAMINOPHEN 500 MG PO TABS
1000.0000 mg | ORAL_TABLET | Freq: Once | ORAL | Status: AC
  Administered 2012-03-19: 1000 mg via ORAL
  Filled 2012-03-19 (×3): qty 1

## 2012-03-19 MED ORDER — LORAZEPAM 2 MG/ML IJ SOLN
0.5000 mg | Freq: Once | INTRAMUSCULAR | Status: AC
  Administered 2012-03-19: 0.5 mg via INTRAVENOUS
  Filled 2012-03-19: qty 1

## 2012-03-19 MED ORDER — DILTIAZEM HCL 100 MG IV SOLR
5.0000 mg/h | Freq: Once | INTRAVENOUS | Status: AC
  Administered 2012-03-19: 5 mg/h via INTRAVENOUS
  Filled 2012-03-19: qty 100

## 2012-03-19 MED ORDER — DEXTROSE 5 % IV SOLN
500.0000 mg | INTRAVENOUS | Status: DC
  Administered 2012-03-19 – 2012-03-21 (×3): 500 mg via INTRAVENOUS
  Filled 2012-03-19 (×4): qty 500

## 2012-03-19 NOTE — H&P (Signed)
PCP:   Allean Found, MD, MD   Chief Complaint:  Increased SOB, non-productive cough and leg swelling  HPI: 78 y/o female with pmh of obesity, HTN, DM, atrial fibrillation and pulmonary hypertension; who came to the hospital complaining 2-3 days of increased SOB, non-productive cough and leg swelling. Patient reports symptoms started suddenly and has continue worsening; she has not missed any of her medication according to her and denies sick contacts.  Patient endorses sore throat and hoarseness; also no bloody diarrhea; increase frequency but not dysuria and associated nausea/vomiting (non-bloody).   In the ED patient was found with mild elevation of WBC's, CXR suggesting lingula infiltrate and vascular congestion; UA with findings suggesting UTI (positive nitrite, moderate Leukocytes) and also low grade fever. TRH was call to admit for further evaluation and treatment.  Patient was in atrial fibrillation with RVR, responded well to use of iv cardizem and cardizem drip.   Allergies:  No Known Allergies    Past Medical History  Diagnosis Date  . Hypertension   . Diabetes mellitus   . Atrial fibrillation   . MI (myocardial infarction)   . Anxiety     Past Surgical History  Procedure Date  . Knee surgery   . Cataract surgery   . Abdominal hysterectomy   . Toe amputation     Prior to Admission medications   Medication Sig Start Date End Date Taking? Authorizing Provider  acetaminophen (TYLENOL) 500 MG tablet Take 500 mg by mouth every 6 (six) hours as needed.     Yes Historical Provider, MD  ALPRAZolam (XANAX) 0.25 MG tablet Take 0.25 mg by mouth daily as needed. For anxiety   Yes Historical Provider, MD  Cholecalciferol (VITAMIN D) 1000 UNITS capsule Take 1,000 Units by mouth daily.     Yes Historical Provider, MD  diltiazem (CARDIZEM CD) 180 MG 24 hr capsule Take 180 mg by mouth daily.     Yes Historical Provider, MD  furosemide (LASIX) 80 MG tablet Take 80 mg by mouth  2 (two) times daily.    Yes Historical Provider, MD  metoprolol (LOPRESSOR) 50 MG tablet Take 25 mg by mouth 2 (two) times daily.     Yes Historical Provider, MD  potassium chloride SA (K-DUR,KLOR-CON) 20 MEQ tablet Take 20 mEq by mouth 2 (two) times daily.     Yes Historical Provider, MD  sertraline (ZOLOFT) 100 MG tablet Take 100 mg by mouth at bedtime.     Yes Historical Provider, MD    Social History:  reports that she has quit smoking. She does not have any smokeless tobacco history on file. She reports that she does not drink alcohol or use illicit drugs.  History reviewed. No pertinent family history.  Review of Systems:  Negative except as otherwise mentioned on HPI.  Physical Exam: Blood pressure 101/63, pulse 104, temperature 98 F (36.7 C), temperature source Oral, resp. rate 26, SpO2 92.00%. Constitutional: She is oriented to person, place, and time. She appears well-developed and well-nourished. No acute distress; really hoarse HENT:  Mouth/Throat: Oropharynx is clear and moist. No sinus tenderness  Eyes: Conjunctivae are normal. No scleral icterus.  Neck: Normal range of motion. Neck supple. Mild JVD present. No tracheal deviation present. No thyromegaly present.  Cardiovascular: irregular; no rubs or gallops; No murmur heard.  Pulmonary/Chest: Effort normal. No respiratory distress. Diffuse rhonchi; bibasilar crackles and no wheezing.  Abdominal: Soft. Obese, positive BS, no tenderness. There is no rebound and no guarding.  Musculoskeletal: 3++ chronic  Bilateral edema to knees; stasis dermatitis changes; L foot with healed/callous plantar lesion and third toe amputation. No drainage or pain.  Neurological: She is alert and oriented to person, place, and time. CN intact; no focal deficit. Skin: Skin is warm and dry. No rash noted. She is not diaphoretic  Labs on Admission:  Results for orders placed during the hospital encounter of 03/19/12 (from the past 48 hour(s))    GLUCOSE, CAPILLARY     Status: Abnormal   Collection Time   03/19/12  2:14 PM      Component Value Range Comment   Glucose-Capillary 116 (*) 70 - 99 (mg/dL)    Comment 1 Notify RN     CBC     Status: Abnormal   Collection Time   03/19/12  3:11 PM      Component Value Range Comment   WBC 10.9 (*) 4.0 - 10.5 (K/uL)    RBC 4.57  3.87 - 5.11 (MIL/uL)    Hemoglobin 14.4  12.0 - 15.0 (g/dL)    HCT 14.7  82.9 - 56.2 (%)    MCV 91.7  78.0 - 100.0 (fL)    MCH 31.5  26.0 - 34.0 (pg)    MCHC 34.4  30.0 - 36.0 (g/dL)    RDW 13.0  86.5 - 78.4 (%)    Platelets 175  150 - 400 (K/uL)   DIFFERENTIAL     Status: Abnormal   Collection Time   03/19/12  3:11 PM      Component Value Range Comment   Neutrophils Relative 86 (*) 43 - 77 (%)    Neutro Abs 9.4 (*) 1.7 - 7.7 (K/uL)    Lymphocytes Relative 5 (*) 12 - 46 (%)    Lymphs Abs 0.5 (*) 0.7 - 4.0 (K/uL)    Monocytes Relative 9  3 - 12 (%)    Monocytes Absolute 1.0  0.1 - 1.0 (K/uL)    Eosinophils Relative 0  0 - 5 (%)    Eosinophils Absolute 0.0  0.0 - 0.7 (K/uL)    Basophils Relative 0  0 - 1 (%)    Basophils Absolute 0.0  0.0 - 0.1 (K/uL)   COMPREHENSIVE METABOLIC PANEL     Status: Abnormal   Collection Time   03/19/12  3:11 PM      Component Value Range Comment   Sodium 135  135 - 145 (mEq/L)    Potassium 4.0  3.5 - 5.1 (mEq/L)    Chloride 100  96 - 112 (mEq/L)    CO2 22  19 - 32 (mEq/L)    Glucose, Bld 115 (*) 70 - 99 (mg/dL)    BUN 19  6 - 23 (mg/dL)    Creatinine, Ser 6.96  0.50 - 1.10 (mg/dL)    Calcium 8.9  8.4 - 10.5 (mg/dL)    Total Protein 6.5  6.0 - 8.3 (g/dL)    Albumin 2.3 (*) 3.5 - 5.2 (g/dL)    AST 54 (*) 0 - 37 (U/L)    ALT 16  0 - 35 (U/L)    Alkaline Phosphatase 165 (*) 39 - 117 (U/L)    Total Bilirubin 1.4 (*) 0.3 - 1.2 (mg/dL)    GFR calc non Af Amer 53 (*) >90 (mL/min)    GFR calc Af Amer 62 (*) >90 (mL/min)   PROTIME-INR     Status: Abnormal   Collection Time   03/19/12  3:11 PM      Component Value Range  Comment  Prothrombin Time 17.0 (*) 11.6 - 15.2 (seconds)    INR 1.36  0.00 - 1.49    TROPONIN I     Status: Normal   Collection Time   03/19/12  3:11 PM      Component Value Range Comment   Troponin I <0.30  <0.30 (ng/mL)   PRO B NATRIURETIC PEPTIDE     Status: Abnormal   Collection Time   03/19/12  3:11 PM      Component Value Range Comment   Pro B Natriuretic peptide (BNP) 6197.0 (*) 0 - 450 (pg/mL)   URINALYSIS, ROUTINE W REFLEX MICROSCOPIC     Status: Abnormal   Collection Time   03/19/12  3:39 PM      Component Value Range Comment   Color, Urine AMBER (*) YELLOW  BIOCHEMICALS MAY BE AFFECTED BY COLOR   APPearance CLOUDY (*) CLEAR     Specific Gravity, Urine 1.020  1.005 - 1.030     pH 6.0  5.0 - 8.0     Glucose, UA NEGATIVE  NEGATIVE (mg/dL)    Hgb urine dipstick NEGATIVE  NEGATIVE     Bilirubin Urine NEGATIVE  NEGATIVE     Ketones, ur TRACE (*) NEGATIVE (mg/dL)    Protein, ur NEGATIVE  NEGATIVE (mg/dL)    Urobilinogen, UA 0.2  0.0 - 1.0 (mg/dL)    Nitrite POSITIVE (*) NEGATIVE     Leukocytes, UA MODERATE (*) NEGATIVE    URINE MICROSCOPIC-ADD ON     Status: Abnormal   Collection Time   03/19/12  3:39 PM      Component Value Range Comment   WBC, UA 21-50  <3 (WBC/hpf) WITH CLUMPS   Bacteria, UA MANY (*) RARE      Radiological Exams on Admission: Dg Chest Port 1 View  03/19/2012  *RADIOLOGY REPORT*  Clinical Data: Fever, cough, congestion  PORTABLE CHEST - 1 VIEW  Comparison: 09/08/2011  Findings: Borderline cardiomegaly noted.  Bilateral mild interstitial prominence without convincing edema.  There is patchy atelectasis or early infiltrate in the lingula.  IMPRESSION: Borderline cardiomegaly.  Mild interstitial prominence bilaterally without convincing pulmonary edema.  Patchy atelectasis or infiltrate in the lingula.  Original Report Authenticated By: Natasha Mead, M.D.     Assessment/Plan 1-SOB (shortness of breath): multifactorial; due to CAP, atrial fibrillation with RVR  and vascular congestion. Will admit to telemetry; restart metoprolol and continue cardizem; will continue lasix, close I's and O's, daily weight and will check 2-D echo (patient might have diastolic heart failure component). Will treat her CAP with zithromax and rocephin and supportive care. Will start incentive spirometry.  2-DIABETES, TYPE 2: check A1C and use SSI.  3-HYPERCHOLESTEROLEMIA: low fat diet. Will check lipid profile  4-Morbid obesity: low calorie discussed with patient.  5-Depression/Anxiety: will continue xanax and sertraline. Patient with recent family member death; will provide grief assistance.  6-OBSTRUCTIVE SLEEP APNEA: has refused CPAP in the past. Will continue oxygen for now.  7-Urinary tract infection: continue rocephin; follow urine culture  8-Atrial fibrillation with rapid ventricular response: continue cardizem overnight; restart metorpolol and transition to po cardizem in am. Patient on telemetry and will cycle CE'z.  9-Laryngitis: voice rest and PRN cepacol.  10-DVT: lovenox.     Time Spent on Admission: 60 minutes  Marcellene Shivley [facility] [phone]  03/19/2012, 7:37 PM

## 2012-03-19 NOTE — ED Notes (Signed)
Attempt to call report, 2nd attempt, Receiving RN not available again

## 2012-03-19 NOTE — ED Notes (Signed)
Dr. Denton Lank notifed of this patient, EDP at bedside, IV established

## 2012-03-19 NOTE — ED Notes (Signed)
afib with Rapid ventricular response noted on monitor, hx of afib

## 2012-03-19 NOTE — ED Notes (Signed)
Attempted to call report to 4E, receiving Rn not able to take report at this time

## 2012-03-19 NOTE — ED Provider Notes (Addendum)
History     CSN: [id number]  Arrival date & time 03/19/12  1400   First MD Initiated Contact with Patient 03/19/12 1418      Chief Complaint  Patient presents with  . Nausea  . Emesis  . Cough  . Leg Swelling    (Consider location/radiation/quality/duration/timing/severity/associated sxs/prior treatment) Patient is a 78 y.o. female presenting with vomiting and cough. The history is provided by the patient and the EMS personnel.  Emesis  Associated symptoms include cough, diarrhea and a fever. Pertinent negatives include no abdominal pain, no chills and no headaches.  Cough Associated symptoms include shortness of breath. Pertinent negatives include no chest pain, no chills, no headaches and no eye redness.  pt c/o non productive cough for past 3-4 days, notes mild sob with coughing spells. Also notes scratchy/sore throat and nasal congestion. No sinus pain or headache. Notes increased bilateral leg edema for past week, states hx same, states compliant w lasix, denies recent medication change. Denies orthopnea or pnd. No chest pain or discomfort. Also notes intermittent nvd in past 1-2 days. Emesis clear to color of recent ingested fluids, no bloody or bilious emesis. Asking for po fluids currently. Diarrhea loose, not bloody. Denies recent abx use or known ill contacts. Hx afib, palpitations today, no faintness or syncope.  Denies gu c/o x notes hx utis. Fevers onset today, denies chills/sweats.   Past Medical History  Diagnosis Date  . Hypertension   . Diabetes mellitus   . Atrial fibrillation   . MI (myocardial infarction)   . Anxiety     Past Surgical History  Procedure Date  . Knee surgery   . Cataract surgery   . Abdominal hysterectomy   . Toe amputation     History reviewed. No pertinent family history.  History  Substance Use Topics  . Smoking status: Former Games developer  . Smokeless tobacco: Not on file  . Alcohol Use: No    OB History    Grav Para Term Preterm  Abortions TAB SAB Ect Mult Living                  Review of Systems  Constitutional: Positive for fever. Negative for chills.  HENT: Negative for trouble swallowing, neck pain and neck stiffness.   Eyes: Negative for discharge and redness.  Respiratory: Positive for cough and shortness of breath.   Cardiovascular: Positive for palpitations and leg swelling. Negative for chest pain.  Gastrointestinal: Positive for vomiting and diarrhea. Negative for abdominal pain.  Genitourinary: Negative for dysuria and flank pain.  Musculoskeletal: Negative for back pain.  Skin: Negative for rash.  Neurological: Negative for headaches.  Hematological: Does not bruise/bleed easily.  Psychiatric/Behavioral: Negative for confusion.    Allergies  Review of patient's allergies indicates no known allergies.  Home Medications   Current Outpatient Rx  Name Route Sig Dispense Refill  . ACETAMINOPHEN 500 MG PO TABS Oral Take 500 mg by mouth every 6 (six) hours as needed.      . ALPRAZOLAM 0.25 MG PO TABS Oral Take 0.25 mg by mouth daily as needed. For anxiety    . VITAMIN D 1000 UNITS PO CAPS Oral Take 1,000 Units by mouth daily.      Marland Kitchen DILTIAZEM HCL ER COATED BEADS 180 MG PO CP24 Oral Take 180 mg by mouth daily.      . FUROSEMIDE 80 MG PO TABS Oral Take 80 mg by mouth every morning.      Marland Kitchen HYDROCODONE-ACETAMINOPHEN 5-325 MG  PO TABS Oral Take 1 tablet by mouth every 6 (six) hours as needed. For pain    . INSULIN GLARGINE 100 UNIT/ML  SOLN Subcutaneous Inject 50-60 Units into the skin daily.     Marland Kitchen METOPROLOL TARTRATE 50 MG PO TABS Oral Take 25 mg by mouth 2 (two) times daily.      Marland Kitchen POTASSIUM CHLORIDE CRYS ER 20 MEQ PO TBCR Oral Take 20 mEq by mouth 2 (two) times daily.      . SERTRALINE HCL 100 MG PO TABS Oral Take 100 mg by mouth at bedtime.        BP 159/89  Pulse 157  Temp(Src) 100.7 F (38.2 C) (Oral)  Resp 22  SpO2 94%  Physical Exam  Nursing note and vitals reviewed. Constitutional:  She is oriented to person, place, and time. She appears well-developed and well-nourished.  HENT:  Mouth/Throat: Oropharynx is clear and moist.       No sinus tenderness  Eyes: Conjunctivae are normal. No scleral icterus.  Neck: Normal range of motion. Neck supple. No JVD present. No tracheal deviation present. No thyromegaly present.       No stiffness or rigidity  Cardiovascular: Normal heart sounds and intact distal pulses.  Exam reveals no gallop and no friction rub.   No murmur heard.      Tachycardic.   Pulmonary/Chest: Effort normal. No respiratory distress.       Non productive cough, upper resp congestion, sl rhonchi left.   Abdominal: Soft. Normal appearance and bowel sounds are normal. She exhibits no distension and no mass. There is no tenderness. There is no rebound and no guarding.  Genitourinary:       No cva tenderness  Musculoskeletal:       Bilateral edema to knees, symmetric.   Neurological: She is alert and oriented to person, place, and time.  Skin: Skin is warm and dry. No rash noted. She is not diaphoretic.  Psychiatric: She has a normal mood and affect.    ED Course  Procedures (including critical care time)  Labs Reviewed  GLUCOSE, CAPILLARY - Abnormal; Notable for the following:    Glucose-Capillary 116 (*)    All other components within normal limits  URINALYSIS, ROUTINE W REFLEX MICROSCOPIC  URINE CULTURE  CBC  DIFFERENTIAL  COMPREHENSIVE METABOLIC PANEL  PROTIME-INR  TROPONIN I  PRO B NATRIURETIC PEPTIDE    Results for orders placed during the hospital encounter of 03/19/12  GLUCOSE, CAPILLARY      Component Value Range   Glucose-Capillary 116 (*) 70 - 99 (mg/dL)   Comment 1 Notify RN    CBC      Component Value Range   WBC 10.9 (*) 4.0 - 10.5 (K/uL)   RBC 4.57  3.87 - 5.11 (MIL/uL)   Hemoglobin 14.4  12.0 - 15.0 (g/dL)   HCT 16.1  09.6 - 04.5 (%)   MCV 91.7  78.0 - 100.0 (fL)   MCH 31.5  26.0 - 34.0 (pg)   MCHC 34.4  30.0 - 36.0 (g/dL)    RDW 40.9  81.1 - 91.4 (%)   Platelets 175  150 - 400 (K/uL)  DIFFERENTIAL      Component Value Range   Neutrophils Relative 86 (*) 43 - 77 (%)   Neutro Abs 9.4 (*) 1.7 - 7.7 (K/uL)   Lymphocytes Relative 5 (*) 12 - 46 (%)   Lymphs Abs 0.5 (*) 0.7 - 4.0 (K/uL)   Monocytes Relative 9  3 - 12 (%)  Monocytes Absolute 1.0  0.1 - 1.0 (K/uL)   Eosinophils Relative 0  0 - 5 (%)   Eosinophils Absolute 0.0  0.0 - 0.7 (K/uL)   Basophils Relative 0  0 - 1 (%)   Basophils Absolute 0.0  0.0 - 0.1 (K/uL)  COMPREHENSIVE METABOLIC PANEL      Component Value Range   Sodium 135  135 - 145 (mEq/L)   Potassium 4.0  3.5 - 5.1 (mEq/L)   Chloride 100  96 - 112 (mEq/L)   CO2 22  19 - 32 (mEq/L)   Glucose, Bld 115 (*) 70 - 99 (mg/dL)   BUN 19  6 - 23 (mg/dL)   Creatinine, Ser 9.60  0.50 - 1.10 (mg/dL)   Calcium 8.9  8.4 - 45.4 (mg/dL)   Total Protein 6.5  6.0 - 8.3 (g/dL)   Albumin 2.3 (*) 3.5 - 5.2 (g/dL)   AST 54 (*) 0 - 37 (U/L)   ALT 16  0 - 35 (U/L)   Alkaline Phosphatase 165 (*) 39 - 117 (U/L)   Total Bilirubin 1.4 (*) 0.3 - 1.2 (mg/dL)   GFR calc non Af Amer 53 (*) >90 (mL/min)   GFR calc Af Amer 62 (*) >90 (mL/min)  PROTIME-INR      Component Value Range   Prothrombin Time 17.0 (*) 11.6 - 15.2 (seconds)   INR 1.36  0.00 - 1.49   TROPONIN I      Component Value Range   Troponin I <0.30  <0.30 (ng/mL)  PRO B NATRIURETIC PEPTIDE      Component Value Range   Pro B Natriuretic peptide (BNP) 6197.0 (*) 0 - 450 (pg/mL)   Dg Chest Port 1 View  03/19/2012  *RADIOLOGY REPORT*  Clinical Data: Fever, cough, congestion  PORTABLE CHEST - 1 VIEW  Comparison: 09/08/2011  Findings: Borderline cardiomegaly noted.  Bilateral mild interstitial prominence without convincing edema.  There is patchy atelectasis or early infiltrate in the lingula.  IMPRESSION: Borderline cardiomegaly.  Mild interstitial prominence bilaterally without convincing pulmonary edema.  Patchy atelectasis or infiltrate in the lingula.   Original Report Authenticated By: Natasha Mead, M.D.      MDM  Iv ns. Oxygen [place]. Monitor, continuous pulse ox.   Nursing notes reviewed.  Additional history obtained from review of prior records on epic.    Labs and cxr done.   cardizem iv bolus and drip.   Tylenol po for fevers.    Date: 03/19/2012  Rate: 110  Rhythm: atrial fibrillation  QRS Axis: right  Intervals: normal  ST/T Wave abnormalities: nonspecific ST/T changes  Conduction Disutrbances:none  Narrative Interpretation:   Old EKG Reviewed: changes noted   Post cardizem bolus and gtt, hr improved. Repeat ecg.   Date: 03/19/2012  Rate: 116  Rhythm: atrial fibrillation  QRS Axis: right  Intervals: normal  ST/T Wave abnormalities: nonspecific ST/T changes  Conduction Disutrbances:none  Narrative Interpretation:   Old EKG Reviewed: changes noted    cxr with infiltrate lingula. w cough and fever, will rx cap (no recent hospitalization). Rocephin and zithromax iv.   [facility] called to admit.     CRITICAL CARE Performed by: Suzi Roots   Total critical care time: 45  Critical care time was exclusive of separately billable procedures and treating other patients.  Critical care was necessary to treat or prevent imminent or life-threatening deterioration.  Critical care was time spent personally by me on the following activities: development of treatment plan with patient and/or surrogate as well  as nursing, discussions with consultants, evaluation of patient's response to treatment, examination of patient, obtaining history from patient or surrogate, ordering and performing treatments and interventions, ordering and review of laboratory studies, ordering and review of radiographic studies, pulse oximetry and re-evaluation of patient's condition.  bnp elev, inc leg edema, will give pt her normal lasix dose iv.    Discussed w Dr Ardyth Harps - indicates place bed request for [facility] team 2, tele.    Recheck pt alert, content, hr 96 rr 20, pulse ox 97%. Discussed xrays/labs, admit plan w pt.   Pt requests anxiety med, ativan .5 mg iv.     Suzi Roots, MD 03/19/12 1558  Suzi Roots, MD 03/19/12 1624  Suzi Roots, MD 03/19/12 [phone]

## 2012-03-19 NOTE — Progress Notes (Signed)
Pt received on unit . Arrived on floor via stretcher pushed by RN. Alert and oriented x 4. In no acute distress. Nonproductive cough noted as pt reports she has laryngitis. erythemous areas noted to groin and bottom. MD made aware. Pt situated in bed at this time. MD at bedside. Vwilliams,rn.

## 2012-03-19 NOTE — ED Notes (Signed)
Per [facility], pt from home, today for N/V/D onset today, also c/o leg edema x 3 weeks and coughing x 3 days. Sat 94% 2 L [place], no sob, uses oxygen at home, denied chest pain.

## 2012-03-20 DIAGNOSIS — I509 Heart failure, unspecified: Secondary | ICD-10-CM

## 2012-03-20 DIAGNOSIS — E782 Mixed hyperlipidemia: Secondary | ICD-10-CM

## 2012-03-20 DIAGNOSIS — I4891 Unspecified atrial fibrillation: Secondary | ICD-10-CM

## 2012-03-20 DIAGNOSIS — J159 Unspecified bacterial pneumonia: Secondary | ICD-10-CM

## 2012-03-20 LAB — BASIC METABOLIC PANEL
BUN: 24 mg/dL — ABNORMAL HIGH (ref 6–23)
CO2: 22 mEq/L (ref 19–32)
Calcium: 8.1 mg/dL — ABNORMAL LOW (ref 8.4–10.5)
Chloride: 105 mEq/L (ref 96–112)
Creatinine, Ser: 1.18 mg/dL — ABNORMAL HIGH (ref 0.50–1.10)
GFR calc Af Amer: 51 mL/min — ABNORMAL LOW (ref >90)
GFR calc non Af Amer: 44 mL/min — ABNORMAL LOW (ref >90)
Glucose, Bld: 120 mg/dL — ABNORMAL HIGH (ref 70–99)
Potassium: 4.1 mEq/L (ref 3.5–5.1)
Sodium: 136 mEq/L (ref 135–145)

## 2012-03-20 LAB — STREP PNEUMONIAE URINARY ANTIGEN: Strep Pneumo Urinary Antigen: NEGATIVE

## 2012-03-20 LAB — CBC
HCT: 37.1 % (ref 36.0–46.0)
Hemoglobin: 12.3 g/dL (ref 12.0–15.0)
MCH: 30.2 pg (ref 26.0–34.0)
MCHC: 33.2 g/dL (ref 30.0–36.0)
MCV: 91.2 fL (ref 78.0–100.0)
RDW: 14.2 % (ref 11.5–15.5)

## 2012-03-20 LAB — LIPID PANEL
Cholesterol: 87 mg/dL (ref 0–200)
HDL: 31 mg/dL — ABNORMAL LOW (ref >39)
Total CHOL/HDL Ratio: 2.8 RATIO
Triglycerides: 47 mg/dL (ref <150)

## 2012-03-20 LAB — GLUCOSE, CAPILLARY
Glucose-Capillary: 106 mg/dL — ABNORMAL HIGH (ref 70–99)
Glucose-Capillary: 132 mg/dL — ABNORMAL HIGH (ref 70–99)
Glucose-Capillary: 133 mg/dL — ABNORMAL HIGH (ref 70–99)

## 2012-03-20 LAB — HEMOGLOBIN A1C: Mean Plasma Glucose: 151 mg/dL — ABNORMAL HIGH (ref <117)

## 2012-03-20 LAB — EXPECTORATED SPUTUM ASSESSMENT W GRAM STAIN, RFLX TO RESP C

## 2012-03-20 LAB — CARDIAC PANEL(CRET KIN+CKTOT+MB+TROPI)
CK, MB: 2.7 ng/mL (ref 0.3–4.0)
Relative Index: INVALID (ref 0.0–2.5)
Total CK: 72 U/L (ref 7–177)
Troponin I: <0.3 ng/mL (ref <0.30)

## 2012-03-20 LAB — TSH: TSH: 2.563 u[IU]/mL (ref 0.350–4.500)

## 2012-03-20 MED ORDER — OMEGA-3-ACID ETHYL ESTERS 1 G PO CAPS
1.0000 g | ORAL_CAPSULE | Freq: Two times a day (BID) | ORAL | Status: DC
  Administered 2012-03-20 – 2012-03-24 (×8): 1 g via ORAL
  Filled 2012-03-20 (×9): qty 1

## 2012-03-20 MED ORDER — DILTIAZEM HCL ER COATED BEADS 180 MG PO CP24
180.0000 mg | ORAL_CAPSULE | Freq: Every day | ORAL | Status: DC
  Administered 2012-03-20 – 2012-03-24 (×5): 180 mg via ORAL
  Filled 2012-03-20 (×6): qty 1

## 2012-03-20 MED ORDER — DEXTROSE 5 % IV SOLN
5.0000 mg/h | INTRAVENOUS | Status: DC
  Administered 2012-03-20: 5 mg/h via INTRAVENOUS
  Filled 2012-03-20: qty 100

## 2012-03-20 NOTE — Plan of Care (Signed)
Problem: Phase I Progression Outcomes Goal: Initial discharge plan identified Outcome: Completed/Met Date Met:  03/20/12 Plans to go home

## 2012-03-20 NOTE — Progress Notes (Signed)
Spiritual Care:  Chaplain paged to assist Ms Morash with spiritual issues connected the death last Apr 28, 2023 of her only child, a 6+ year old son. She is not grieving the way others expect her to grieve, specifically she is not grieving the way her husband is grieving. He is crying and is consolable, she can not bring herself to cry and feels she is wrong for not crying. This is a source of great concern and guilt. After being told that each person grieves in their own unique way, and that grief has a way of overwhelming [place] at different times, she stated she felt less stressed and less guilty for the way she is currently handling the death of her only child. There is little doubt that she loved her son deeply and is deeply in grief over the physical loss of her child. Her grief style does not bear out her guilt of "not grieving right."  Because of her stress over her grief style and perhaps her suppression of her feelings, there spiritual pain may manifest itself as a multiplier of in her physical situation. Team care seems an affective way of treating both physical and spiritual illness.  RECOMMEND: A consult for follow up chaplain care be placed in the [facility] system. Further, a page to a chaplain be placed if the pt's husband comes and the pt wishes spiritual care for both pt and husband.  Benjie Karvonen. Magaret Justo, D.Min. Chaplain 6:00 PM  03/20/2012

## 2012-03-20 NOTE — Progress Notes (Signed)
Subjective: Feeling better; still with hoarseness, cough and chills. No CP; and reports improvement on SOB.  Objective: Vital signs in last 24 hours: Temp:  [97.7 F (36.5 C)-99.7 F (37.6 C)] 99.7 F (37.6 C) (05/12 1501) Pulse Rate:  [84-114] 84  (05/12 1501) Resp:  [20-26] 20  (05/12 1501) BP: (97-124)/(59-71) 97/59 mmHg (05/12 1501) SpO2:  [90 %-94 %] 90 % (05/12 1501) FiO2 (%):  [2 %] 2 % (05/11 1857) Weight:  [130.7 kg (288 lb 2.3 oz)] 130.7 kg (288 lb 2.3 oz) (05/12 1700) Weight change:  Last BM Date: 03/19/12  Intake/Output from previous day: 05/11 0701 - 05/12 0700 In: 610 [P.O.:360; IV Piggyback:250] Out: 1576 [Urine:1575; Stool:1] Total I/O In: -  Out: 275 [Urine:275]   Physical Exam: General: Alert, awake, oriented x3, in no acute distress. HEENT: No bruits, no goiter. Heart: S1 and S2, without murmurs, rubs, gallops. Lungs: bibasilar crackles improved from yesterday; diffuse rhonchi, no wheezing. Abdomen: Soft, nontender, nondistended, positive bowel sounds. Extremities: No clubbing cyanosis or edema with positive pedal pulses. Neuro: Grossly intact, nonfocal.   Lab Results: Basic Metabolic Panel:  Basename 03/20/12 0327 03/19/12 1511  NA 136 135  K 4.1 4.0  CL 105 100  CO2 22 22  GLUCOSE 120* 115*  BUN 24* 19  CREATININE 1.18* 1.01  CALCIUM 8.1* 8.9  MG -- --  PHOS -- --   Liver Function Tests:  Basename 03/19/12 1511  AST 54*  ALT 16  ALKPHOS 165*  BILITOT 1.4*  PROT 6.5  ALBUMIN 2.3*   CBC:  Basename 03/20/12 0327 03/19/12 1511  WBC 7.9 10.9*  NEUTROABS -- 9.4*  HGB 12.3 14.4  HCT 37.1 41.9  MCV 91.2 91.7  PLT 178 175   Cardiac Enzymes:  Basename 03/20/12 0327 03/19/12 1953 03/19/12 1511  CKTOTAL 72 78 --  CKMB 2.7 3.1 --  CKMBINDEX -- -- --  TROPONINI <0.30 <0.30 <0.30   BNP:  Basename 03/19/12 1511  PROBNP 6197.0*   CBG:  Basename 03/20/12 1145 03/20/12 0751 03/19/12 2247 03/19/12 1414  GLUCAP 132* 106* 162*  116*   Hemoglobin A1C:  Basename 03/19/12 1947  HGBA1C 6.9*   Fasting Lipid Panel:  Basename 03/20/12 0327  CHOL 87  HDL 31*  LDLCALC 47  TRIG 47  CHOLHDL 2.8  LDLDIRECT --   Thyroid Function Tests:  Basename 03/19/12 1947  TSH 2.563  T4TOTAL --  FREET4 --  T3FREE --  THYROIDAB --   Coagulation:  Basename 03/19/12 1511  LABPROT 17.0*  INR 1.36   Urinalysis:  Basename 03/19/12 1539  COLORURINE AMBER*  LABSPEC 1.020  PHURINE 6.0  GLUCOSEU NEGATIVE  HGBUR NEGATIVE  BILIRUBINUR NEGATIVE  KETONESUR TRACE*  PROTEINUR NEGATIVE  UROBILINOGEN 0.2  NITRITE POSITIVE*  LEUKOCYTESUR MODERATE*    Studies/Results: Dg Chest Port 1 View  03/19/2012  *RADIOLOGY REPORT*  Clinical Data: Fever, cough, congestion  PORTABLE CHEST - 1 VIEW  Comparison: 09/08/2011  Findings: Borderline cardiomegaly noted.  Bilateral mild interstitial prominence without convincing edema.  There is patchy atelectasis or early infiltrate in the lingula.  IMPRESSION: Borderline cardiomegaly.  Mild interstitial prominence bilaterally without convincing pulmonary edema.  Patchy atelectasis or infiltrate in the lingula.  Original Report Authenticated By: Natasha Mead, M.D.    Medications: Scheduled Meds:   . aspirin EC  325 mg Oral Daily  . azithromycin  500 mg Intravenous Q24H  . cefTRIAXone (ROCEPHIN)  IV  1 g Intravenous Q24H  . cholecalciferol  1,000 Units Oral Daily  .  diltiazem  180 mg Oral Daily  . enoxaparin  40 mg Subcutaneous Q24H  . furosemide  80 mg Oral BID  . insulin aspart  0-9 Units Subcutaneous TID WC  . metoprolol  25 mg Oral BID  . potassium chloride SA  20 mEq Oral BID  . sertraline  100 mg Oral QHS  . sodium chloride  3 mL Intravenous Q12H  . DISCONTD: azithromycin  500 mg Intravenous Q24H  . DISCONTD: cefTRIAXone (ROCEPHIN)  IV  1 g Intravenous Q24H   Continuous Infusions:   . DISCONTD: diltiazem (CARDIZEM) infusion 5 mg/hr (03/20/12 0544)   PRN Meds:.sodium chloride,  acetaminophen, acetaminophen, ALPRAZolam, guaiFENesin-dextromethorphan, levalbuterol, menthol-cetylpyridinium, morphine injection, ondansetron (ZOFRAN) IV, ondansetron, sodium chloride  Assessment/Plan: 1-SOB (shortness of breath): multifactorial; due to CAP, atrial fibrillation with RVR and vascular congestion. Improving. Continue IV antibiotics; will d/c cardizem drip and use home PO regimen for rate control. Will continue lasix, close I's and O's, daily weight. Will follow 2-D echo (patient might have diastolic heart failure component). Will start incentive spirometry.   2-DIABETES, TYPE 2: A1C 6.9 and continue SSI.   3-HYPERCHOLESTEROLEMIA: low fat diet. Will add lovaza for low HDL.   4-Morbid obesity: low calorie discussed with patient.   5-Depression/Anxiety: will continue xanax and sertraline. Patient with recent family member death; will provide grief assistance.   6-OBSTRUCTIVE SLEEP APNEA: has refused CPAP in the past. Will continue oxygen for now.   7-Urinary tract infection: continue rocephin; follow urine culture.  8-Atrial fibrillation with rapid ventricular response: rate control now. Restart home cardizem dose and continue metoprolol. Not on coumadin. Will continue ASA.  9-Laryngitis: continue voice rest and PRN cepacol.   10-mild Acute renal insufficiency: due to diuresis; will resume home dose of lasix and follow Cr trend.  11-DVT: lovenox.     LOS: 1 day   Steffen Hase [facility] [phone]  03/20/2012, 5:09 PM

## 2012-03-21 DIAGNOSIS — I4891 Unspecified atrial fibrillation: Secondary | ICD-10-CM

## 2012-03-21 DIAGNOSIS — J159 Unspecified bacterial pneumonia: Secondary | ICD-10-CM

## 2012-03-21 DIAGNOSIS — I509 Heart failure, unspecified: Secondary | ICD-10-CM

## 2012-03-21 DIAGNOSIS — E782 Mixed hyperlipidemia: Secondary | ICD-10-CM

## 2012-03-21 LAB — BASIC METABOLIC PANEL
BUN: 29 mg/dL — ABNORMAL HIGH (ref 6–23)
CO2: 22 mEq/L (ref 19–32)
Chloride: 102 mEq/L (ref 96–112)
Creatinine, Ser: 1.35 mg/dL — ABNORMAL HIGH (ref 0.50–1.10)
GFR calc Af Amer: 43 mL/min — ABNORMAL LOW (ref >90)
Potassium: 3.7 mEq/L (ref 3.5–5.1)

## 2012-03-21 LAB — LEGIONELLA ANTIGEN, URINE: Legionella Antigen, Urine: NEGATIVE

## 2012-03-21 LAB — URINE CULTURE

## 2012-03-21 LAB — CBC
HCT: 37.9 % (ref 36.0–46.0)
Hemoglobin: 12.6 g/dL (ref 12.0–15.0)
MCV: 91.8 fL (ref 78.0–100.0)
RBC: 4.13 MIL/uL (ref 3.87–5.11)
WBC: 6.7 10*3/uL (ref 4.0–10.5)

## 2012-03-21 LAB — GLUCOSE, CAPILLARY: Glucose-Capillary: 125 mg/dL — ABNORMAL HIGH (ref 70–99)

## 2012-03-21 MED ORDER — LEVALBUTEROL HCL 0.63 MG/3ML IN NEBU
0.6300 mg | INHALATION_SOLUTION | Freq: Four times a day (QID) | RESPIRATORY_TRACT | Status: DC
  Administered 2012-03-21 – 2012-03-22 (×2): 0.63 mg via RESPIRATORY_TRACT
  Filled 2012-03-21 (×8): qty 3

## 2012-03-21 MED ORDER — BENZONATATE 100 MG PO CAPS
100.0000 mg | ORAL_CAPSULE | Freq: Two times a day (BID) | ORAL | Status: DC
  Administered 2012-03-21 – 2012-03-24 (×7): 100 mg via ORAL
  Filled 2012-03-21 (×8): qty 1

## 2012-03-21 NOTE — Consult Note (Signed)
WOC consult Note Reason for Consult: Rash and symptoms consistent with yeast overgrowth on inner thighs, and in intertriginous folds of groin and lower abdomen. Also present are symptoms consistent with vaginal yeast eg., thick, yellow drainage Patient also has long standing history of venous insufficiency and has used OTC lubriderm with marginal results. Pressure Ulcer POA: No Recommend consideration of systemic antifungal (diflucan) unless medically contraindicated.  Also, will implement daily cleansing of the affected areas and placement of a textile product for reduction in the moisture and flora contributing to her condition. i will not follow.  Please re consult as needed. Thanks, Ladona Mow, MSN, RN, GNP, CWOCN [phone])

## 2012-03-21 NOTE — Progress Notes (Signed)
*  PRELIMINARY RESULTS* Echocardiogram 2D Echocardiogram has been performed.  Katheren Puller 03/21/2012, 11:49 AM

## 2012-03-21 NOTE — Progress Notes (Addendum)
Subjective: Sitting up in bed alert, NAD. Reports "coughing all night". Denies pain. Mild SOB at rest.   Objective: Vital signs Filed Vitals:   03/20/12 1501 03/20/12 1700 03/20/12 2127 03/21/12 0539  BP: 97/59  115/65 107/67  Pulse: 84  90 93  Temp: 99.7 F (37.6 C)  97.6 F (36.4 C) 98.7 F (37.1 C)  TempSrc: Oral  Oral Oral  Resp: 20  20 20   Height:      Weight:  130.7 kg (288 lb 2.3 oz)  130.3 kg (287 lb 4.2 oz)  SpO2: 90%  95% 91%   Weight change:  Last BM Date: 03/21/12  Intake/Output from previous day: 05/12 0701 - 05/13 0700 In: 304.3 [P.O.:120; I.V.:134.3; IV Piggyback:50] Out: 1775 [Urine:1775]     Physical Exam: General: Alert, awake, oriented x3, in no acute distress. Morbidly obese.  HEENT: No bruits, no goiter. Mucus membranes mouth somewhat dry, pink. PERRL Heart: Irregularly irregular, without murmurs, rubs, gallops. Lungs:  Very mild increased work of breathing with conversation. Breath sounds with moderate rhonchi throughout. Somewhat moist non productive cough. No wheezing. Abdomen:  Obese, Soft, nontender, nondistended, positive bowel sounds. Extremities: 3+chronic bilateral LEE from knees down. Chronic stasis dermatitis changes bilaterally. 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  Neuro: Grossly intact, nonfocal. Cranial nerve II-XII intact.     Lab Results: Basic Metabolic Panel:  Basename 03/21/12 0450 03/20/12 0327  NA 135 136  K 3.7 4.1  CL 102 105  CO2 22 22  GLUCOSE 116* 120*  BUN 29* 24*  CREATININE 1.35* 1.18*  CALCIUM 8.2* 8.1*  MG -- --  PHOS -- --   Liver Function Tests:  Basename 03/19/12 1511  AST 54*  ALT 16  ALKPHOS 165*  BILITOT 1.4*  PROT 6.5  ALBUMIN 2.3*   No results found for this basename: LIPASE:2,AMYLASE:2 in the last 72 hours No results found for this basename: AMMONIA:2 in the last 72 hours CBC:  Basename 03/21/12 0450 03/20/12 0327 03/19/12 1511  WBC 6.7 7.9 --  NEUTROABS -- -- 9.4*  HGB 12.6 12.3 --  HCT  37.9 37.1 --  MCV 91.8 91.2 --  PLT 177 178 --   Cardiac Enzymes:  Basename 03/20/12 0327 03/19/12 1953 03/19/12 1511  CKTOTAL 72 78 --  CKMB 2.7 3.1 --  CKMBINDEX -- -- --  TROPONINI <0.30 <0.30 <0.30   BNP:  Basename 03/19/12 1511  PROBNP 6197.0*   D-Dimer: No results found for this basename: DDIMER:2 in the last 72 hours CBG:  Basename 03/21/12 0817 03/20/12 2128 03/20/12 1709 03/20/12 1145 03/20/12 0751 03/19/12 2247  GLUCAP 113* 133* 116* 132* 106* 162*   Hemoglobin A1C:  Basename 03/19/12 1947  HGBA1C 6.9*   Fasting Lipid Panel:  Basename 03/20/12 0327  CHOL 87  HDL 31*  LDLCALC 47  TRIG 47  CHOLHDL 2.8  LDLDIRECT --   Thyroid Function Tests:  Basename 03/19/12 1947  TSH 2.563  T4TOTAL --  FREET4 --  T3FREE --  THYROIDAB --   Anemia Panel: No results found for this basename: VITAMINB12,FOLATE,FERRITIN,TIBC,IRON,RETICCTPCT in the last 72 hours Coagulation:  Basename 03/19/12 1511  LABPROT 17.0*  INR 1.36   Urine Drug Screen: Drugs of Abuse  No results found for this basename: labopia, cocainscrnur, labbenz, amphetmu, thcu, labbarb    Alcohol Level: No results found for this basename: ETH:2 in the last 72 hours Urinalysis:  Basename 03/19/12 1539  COLORURINE AMBER*  LABSPEC 1.020  PHURINE 6.0  GLUCOSEU NEGATIVE  HGBUR NEGATIVE  BILIRUBINUR NEGATIVE  KETONESUR TRACE*  PROTEINUR NEGATIVE  UROBILINOGEN 0.2  NITRITE POSITIVE*  LEUKOCYTESUR MODERATE*   Misc. Labs:  Recent Results (from the past 240 hour(s))  URINE CULTURE     Status: Normal   Collection Time   03/19/12  3:39 PM      Component Value Range Status Comment   Specimen Description URINE, CLEAN CATCH   Final    Special Requests NONE   Final    Culture  Setup Time [id number]   Final    Colony Count >=100,000 COLONIES/ML   Final    Culture ESCHERICHIA COLI   Final    Report Status 03/21/2012 FINAL   Final    Organism ID, Bacteria ESCHERICHIA COLI   Final   CULTURE,  EXPECTORATED SPUTUM-ASSESSMENT     Status: Normal   Collection Time   03/20/12  8:52 PM      Component Value Range Status Comment   Specimen Description SPUTUM   Final    Special Requests NONE   Final    Sputum evaluation     Final    Value: THIS SPECIMEN IS ACCEPTABLE. RESPIRATORY CULTURE REPORT TO FOLLOW.   Report Status 03/20/2012 FINAL   Final     Studies/Results: Dg Chest Port 1 View  03/19/2012  *RADIOLOGY REPORT*  Clinical Data: Fever, cough, congestion  PORTABLE CHEST - 1 VIEW  Comparison: 09/08/2011  Findings: Borderline cardiomegaly noted.  Bilateral mild interstitial prominence without convincing edema.  There is patchy atelectasis or early infiltrate in the lingula.  IMPRESSION: Borderline cardiomegaly.  Mild interstitial prominence bilaterally without convincing pulmonary edema.  Patchy atelectasis or infiltrate in the lingula.  Original Report Authenticated By: Natasha Mead, M.D.    Medications: Scheduled Meds:   . aspirin EC  325 mg Oral Daily  . azithromycin  500 mg Intravenous Q24H  . benzonatate  100 mg Oral BID  . cefTRIAXone (ROCEPHIN)  IV  1 g Intravenous Q24H  . cholecalciferol  1,000 Units Oral Daily  . diltiazem  180 mg Oral Daily  . enoxaparin  40 mg Subcutaneous Q24H  . furosemide  80 mg Oral BID  . insulin aspart  0-9 Units Subcutaneous TID WC  . levalbuterol  0.63 mg Nebulization Q6H  . metoprolol  25 mg Oral BID  . omega-3 acid ethyl esters  1 g Oral BID  . potassium chloride SA  20 mEq Oral BID  . sertraline  100 mg Oral QHS  . sodium chloride  3 mL Intravenous Q12H   Continuous Infusions:   . DISCONTD: diltiazem (CARDIZEM) infusion 5 mg/hr (03/20/12 0544)   PRN Meds:.sodium chloride, acetaminophen, acetaminophen, ALPRAZolam, guaiFENesin-dextromethorphan, menthol-cetylpyridinium, morphine injection, ondansetron (ZOFRAN) IV, ondansetron, sodium chloride, DISCONTD: levalbuterol  Assessment/Plan:  Principal Problem:  *SOB (shortness of  breath) Active Problems:  DIABETES, TYPE 2  HYPERCHOLESTEROLEMIA  Morbid obesity  ANXIETY  OBSTRUCTIVE SLEEP APNEA  PULMONARY HYPERTENSION  Urinary tract infection  Atrial fibrillation with rapid ventricular response  Laryngitis  1-SOB (shortness of breath): multifactorial; due to CAP, atrial fibrillation with RVR and vascular congestion. Improving slowly.  Continue Azithromycin and rocephin day #3. Heart rate controlled with po cardizem.  Will continue lasix. Volume status neg 2.4L.   Weight stable at 130.7 KG. 2-D echo results pending. (patient might have diastolic heart failure component). Continue  incentive spirometry. Will change nebulizer to scheduled and add tessalon .  sats range 91-95 2Lnc.   2-DIABETES, TYPE 2: A1C 6.9 and continue SSI. CBG's range 113-137; might need  PO agent at discharge and close follow up with PCP (probably glipizide).   3-HYPERCHOLESTEROLEMIA: low fat diet. Will add lovaza for low HDL.   4-Morbid obesity: low calorie discussed with patient.   5-Depression/Anxiety: will continue xanax and sertraline. Patient with recent family member death; will provide grief assistance. Appreciate chaplain assistance. Pt reports wanting to see husband. Husband coming in today.   6-OBSTRUCTIVE SLEEP APNEA: has refused CPAP in the past. Will continue oxygen for now.   7-E. Coli Urinary tract infection: continue rocephin; E. Coli. Sensitive to rocephin day #3.   8-Atrial fibrillation with rapid ventricular response: rate control now. Continue cardizem dose and continue metoprolol. Not on coumadin. Will continue ASA.   9-Laryngitis: Little improvement. continue voice rest and PRN cepacol.   10-mild Acute renal insufficiency: due to diuresis; will continue home dose of lasix and follow Cr trend. BMET in am  11-DVT: lovenox.      LOS: 2 days   Aishah Teffeteller [phone] 03/21/2012, 11:35 AM

## 2012-03-22 DIAGNOSIS — E782 Mixed hyperlipidemia: Secondary | ICD-10-CM

## 2012-03-22 DIAGNOSIS — J159 Unspecified bacterial pneumonia: Secondary | ICD-10-CM

## 2012-03-22 DIAGNOSIS — I4891 Unspecified atrial fibrillation: Secondary | ICD-10-CM

## 2012-03-22 DIAGNOSIS — I509 Heart failure, unspecified: Secondary | ICD-10-CM

## 2012-03-22 LAB — GLUCOSE, CAPILLARY

## 2012-03-22 LAB — BASIC METABOLIC PANEL
BUN: 26 mg/dL — ABNORMAL HIGH (ref 6–23)
Calcium: 7.8 mg/dL — ABNORMAL LOW (ref 8.4–10.5)
GFR calc non Af Amer: 39 mL/min — ABNORMAL LOW (ref >90)
Glucose, Bld: 112 mg/dL — ABNORMAL HIGH (ref 70–99)

## 2012-03-22 MED ORDER — AZITHROMYCIN 500 MG PO TABS
500.0000 mg | ORAL_TABLET | ORAL | Status: DC
  Administered 2012-03-22 – 2012-03-23 (×2): 500 mg via ORAL
  Filled 2012-03-22 (×3): qty 1

## 2012-03-22 MED ORDER — LEVALBUTEROL HCL 0.63 MG/3ML IN NEBU
0.6300 mg | INHALATION_SOLUTION | Freq: Three times a day (TID) | RESPIRATORY_TRACT | Status: DC
  Administered 2012-03-22 – 2012-03-24 (×7): 0.63 mg via RESPIRATORY_TRACT
  Filled 2012-03-22 (×9): qty 3

## 2012-03-22 MED ORDER — FLUCONAZOLE 150 MG PO TABS
150.0000 mg | ORAL_TABLET | Freq: Every day | ORAL | Status: DC
  Administered 2012-03-22 – 2012-03-24 (×3): 150 mg via ORAL
  Filled 2012-03-22 (×3): qty 1

## 2012-03-22 NOTE — Progress Notes (Signed)
RT note pt refusing to put O2 on even though sat of 87-89

## 2012-03-22 NOTE — Evaluation (Signed)
Physical Therapy Evaluation Patient Details Name: Tara Huff MRN: [id number] DOB: 1933/12/10 Today's Date: 03/22/2012 Time: 3086-5784 PT Time Calculation (min): 32 min  PT Assessment / Plan / Recommendation Clinical Impression  Pt presents with diagnosis of Afib, Pna, vascular congestion. Pt is currently requiring +2 assist for safe mobility. Pt hopes to return home with husband assisting. At baseline, pt states she was able to perform stand-pivot to wheelchair, BSC. Will follow to maximize independence and  safety in preparation for d/c home.     PT Assessment  Patient needs continued PT services    Follow Up Recommendations  Home health PT;Supervision/Assistance - 24 hour    Barriers to Discharge        lEquipment Recommendations  None recommended by PT    Recommendations for Other Services OT consult   Frequency Min 3X/week    Precautions / Restrictions Precautions Precautions: Fall Restrictions Weight Bearing Restrictions: No   Pertinent Vitals/Pain       Mobility  Bed Mobility Bed Mobility: Supine to Sit Supine to Sit: 1: +2 Total assist;HOB flat Supine to Sit: Patient Percentage: 50% Details for Bed Mobility Assistance: Assist for bil LEs off bed and trunk to upright. Utilized bedpad to assist with scooting, positioning Transfers Transfers: Sit to Stand;Stand to Sit;Stand Pivot Transfers Sit to Stand: 1: +2 Total assist;From elevated surface;From bed;With upper extremity assist Sit to Stand: Patient Percentage: 60% Stand to Sit: 1: +2 Total assist;To chair/3-in-1;With upper extremity assist;With armrests Stand to Sit: Patient Percentage: 60% Stand Pivot Transfers: 1: +2 Total assist Stand Pivot Transfers: Patient Percentage: 60% Details for Transfer Assistance: Assist to rise, stabilize, control descent. VCs safety, hand placement. Utilized RW.  Increased time.  Ambulation/Gait Ambulation/Gait Assistance: Not tested (comment) Ambulation Distance (Feet): 3  Feet (3 forward, 3 backwards) Assistive device: Rolling walker Gait Pattern: Decreased stride length;Decreased step length - right;Decreased step length - left    Exercises     PT Diagnosis: Difficulty walking;Generalized weakness  PT Problem List: Decreased mobility;Decreased activity tolerance;Decreased strength PT Treatment Interventions: DME instruction;Gait training;Functional mobility training;Therapeutic activities;Therapeutic exercise;Patient/family education   PT Goals Acute Rehab PT Goals PT Goal Formulation: With patient Time For Goal Achievement: 04/05/12 Potential to Achieve Goals: Fair Pt will go Supine/Side to Sit: with min assist PT Goal: Supine/Side to Sit - Progress: Goal set today Pt will go Sit to Supine/Side: with min assist PT Goal: Sit to Supine/Side - Progress: Goal set today Pt will go Sit to Stand: with min assist PT Goal: Sit to Stand - Progress: Goal set today Pt will go Stand to Sit: with min assist PT Goal: Stand to Sit - Progress: Goal set today Pt will Transfer Bed to Chair/Chair to Bed: with min assist PT Transfer Goal: Bed to Chair/Chair to Bed - Progress: Goal set today Pt will Ambulate: with +2 total assist;with rolling walker;1 - 15 feet PT Goal: Ambulate - Progress: Goal set today  Visit Information  Last PT Received On: 03/22/12 Assistance Needed: +2    Subjective Data  Subjective: "Everytime I cough, it comes out" Patient Stated Goal: Return home   Prior Functioning  Home Living Lives With: Spouse Available Help at Discharge: Family Type of Home: House Home Layout: Two level;Able to live on main level with bedroom/bathroom Home Adaptive Equipment: Bedside commode/3-in-1;Walker - rolling;Wheelchair - Architectural technologist with back Prior Function Level of Independence: Needs assistance Needs Assistance: Bathing;Dressing Bath: Moderate Dressing: Moderate Communication Communication: No difficulties    Cognition  Overall Cognitive  Status: Appears within functional limits for tasks assessed/performed Arousal/Alertness: Awake/alert Orientation Level: Appears intact for tasks assessed Behavior During Session: [facility] for tasks performed    Extremity/Trunk Assessment Right Lower Extremity Assessment RLE ROM/Strength/Tone: Unable to fully assess;Deficits RLE ROM/Strength/Tone Deficits: Strength at least 4/5 during functional activity. Note edema throughout LE.  Left Lower Extremity Assessment LLE ROM/Strength/Tone: Unable to fully assess;Deficits LLE ROM/Strength/Tone Deficits: strength at least 4/5 during functional activity. Note edema throughout LE.   Balance    End of Session PT - End of Session Activity Tolerance: Patient tolerated treatment well Patient left: in chair;with call bell/phone within reach   Rebeca Alert [facility] 03/22/2012, 3:15 PM [phone]

## 2012-03-22 NOTE — Progress Notes (Signed)
Subjective: Sitting up in bed eating breakfast. Reports "I slept last night!". Reports epistaxis and refusing oxygen via [place].   Objective: Vital signs Filed Vitals:   03/21/12 1949 03/21/12 2244 03/22/12 0636 03/22/12 0821  BP:  129/81 108/63   Pulse:  90 73   Temp:  99.6 F (37.6 C) 98.2 F (36.8 C)   TempSrc:  Oral Oral   Resp:  24 20   Height:      Weight:   129.2 kg (284 lb 13.4 oz)   SpO2: 91% 95% 94% 91%   Weight change: -1.5 kg (-3 lb 4.9 oz) Last BM Date: 03/22/12  Intake/Output from previous day: 05/13 0701 - 05/14 0700 In: 760 [P.O.:460; IV Piggyback:300] Out: 3600 [Urine:3600]     Physical Exam: General: Alert, awake, oriented x3, in no acute distress. HEENT: No bruits, no goiter. Heart: Irregular irregular rythm without murmurs, rubs, gallops. Lungs:  Normal effort. Breath sounds remain coarse but improved air movement. No wheeze Abdomen:Obese, Soft, nontender, nondistended, positive bowel sounds. Foley intact draining clear amber urine Extremities:  3+ chronic bilateral LEE from knees down. Chronic stasis changes bilaterally. No clubbing cyanosis with positive pedal pulses. Neuro: Grossly intact, nonfocal. Speech clear.  Skin: rash on inner thighs and folds of groin and lower abdomen.     Lab Results: Basic Metabolic Panel:  Basename 03/22/12 0705 03/21/12 0450  NA 137 135  K 3.4* 3.7  CL 105 102  CO2 25 22  GLUCOSE 112* 116*  BUN 26* 29*  CREATININE 1.30* 1.35*  CALCIUM 7.8* 8.2*  MG -- --  PHOS -- --   Liver Function Tests:  Basename 03/19/12 1511  AST 54*  ALT 16  ALKPHOS 165*  BILITOT 1.4*  PROT 6.5  ALBUMIN 2.3*   No results found for this basename: LIPASE:2,AMYLASE:2 in the last 72 hours No results found for this basename: AMMONIA:2 in the last 72 hours CBC:  Basename 03/21/12 0450 03/20/12 0327 03/19/12 1511  WBC 6.7 7.9 --  NEUTROABS -- -- 9.4*  HGB 12.6 12.3 --  HCT 37.9 37.1 --  MCV 91.8 91.2 --  PLT 177 178 --    Cardiac Enzymes:  Basename 03/20/12 0327 03/19/12 1953 03/19/12 1511  CKTOTAL 72 78 --  CKMB 2.7 3.1 --  CKMBINDEX -- -- --  TROPONINI <0.30 <0.30 <0.30   BNP:  Basename 03/19/12 1511  PROBNP 6197.0*   D-Dimer: No results found for this basename: DDIMER:2 in the last 72 hours CBG:  Basename 03/22/12 0802 03/21/12 2134 03/21/12 1659 03/21/12 1150 03/21/12 0817 03/20/12 2128  GLUCAP 102* 148* 150* 125* 113* 133*   Hemoglobin A1C:  Basename 03/19/12 1947  HGBA1C 6.9*   Fasting Lipid Panel:  Basename 03/20/12 0327  CHOL 87  HDL 31*  LDLCALC 47  TRIG 47  CHOLHDL 2.8  LDLDIRECT --   Thyroid Function Tests:  Basename 03/19/12 1947  TSH 2.563  T4TOTAL --  FREET4 --  T3FREE --  THYROIDAB --   Anemia Panel: No results found for this basename: VITAMINB12,FOLATE,FERRITIN,TIBC,IRON,RETICCTPCT in the last 72 hours Coagulation:  Basename 03/19/12 1511  LABPROT 17.0*  INR 1.36   Urine Drug Screen: Drugs of Abuse  No results found for this basename: labopia, cocainscrnur, labbenz, amphetmu, thcu, labbarb    Alcohol Level: No results found for this basename: ETH:2 in the last 72 hours Urinalysis:  Basename 03/19/12 1539  COLORURINE AMBER*  LABSPEC 1.020  PHURINE 6.0  GLUCOSEU NEGATIVE  HGBUR NEGATIVE  BILIRUBINUR NEGATIVE  KETONESUR TRACE*  PROTEINUR NEGATIVE  UROBILINOGEN 0.2  NITRITE POSITIVE*  LEUKOCYTESUR MODERATE*   Misc. Labs:  Recent Results (from the past 240 hour(s))  URINE CULTURE     Status: Normal   Collection Time   03/19/12  3:39 PM      Component Value Range Status Comment   Specimen Description URINE, CLEAN CATCH   Final    Special Requests NONE   Final    Culture  Setup Time [id number]   Final    Colony Count >=100,000 COLONIES/ML   Final    Culture ESCHERICHIA COLI   Final    Report Status 03/21/2012 FINAL   Final    Organism ID, Bacteria ESCHERICHIA COLI   Final   CULTURE, RESPIRATORY     Status: Normal (Preliminary result)    Collection Time   03/20/12  8:50 PM      Component Value Range Status Comment   Specimen Description SPUTUM   Final    Special Requests NONE   Final    Gram Stain     Final    Value: ABUNDANT WBC PRESENT,BOTH PMN AND MONONUCLEAR     NO SQUAMOUS EPITHELIAL CELLS SEEN     RARE GRAM POSITIVE COCCI     IN PAIRS RARE GRAM NEGATIVE RODS     RARE GRAM POSITIVE RODS   Culture PENDING   Incomplete    Report Status PENDING   Incomplete   CULTURE, EXPECTORATED SPUTUM-ASSESSMENT     Status: Normal   Collection Time   03/20/12  8:52 PM      Component Value Range Status Comment   Specimen Description SPUTUM   Final    Special Requests NONE   Final    Sputum evaluation     Final    Value: THIS SPECIMEN IS ACCEPTABLE. RESPIRATORY CULTURE REPORT TO FOLLOW.   Report Status 03/20/2012 FINAL   Final     Studies/Results: No results found.  Medications: Scheduled Meds:   . aspirin EC  325 mg Oral Daily  . azithromycin  500 mg Intravenous Q24H  . benzonatate  100 mg Oral BID  . cefTRIAXone (ROCEPHIN)  IV  1 g Intravenous Q24H  . cholecalciferol  1,000 Units Oral Daily  . diltiazem  180 mg Oral Daily  . enoxaparin  40 mg Subcutaneous Q24H  . furosemide  80 mg Oral BID  . insulin aspart  0-9 Units Subcutaneous TID WC  . levalbuterol  0.63 mg Nebulization TID  . metoprolol  25 mg Oral BID  . omega-3 acid ethyl esters  1 g Oral BID  . potassium chloride SA  20 mEq Oral BID  . sertraline  100 mg Oral QHS  . sodium chloride  3 mL Intravenous Q12H  . DISCONTD: levalbuterol  0.63 mg Nebulization Q6H   Continuous Infusions:  PRN Meds:.sodium chloride, acetaminophen, acetaminophen, ALPRAZolam, guaiFENesin-dextromethorphan, menthol-cetylpyridinium, morphine injection, ondansetron (ZOFRAN) IV, ondansetron, sodium chloride, DISCONTD: levalbuterol  Assessment/Plan:  Principal Problem:  *SOB (shortness of breath) Active Problems:  DIABETES, TYPE 2  HYPERCHOLESTEROLEMIA  Morbid obesity  ANXIETY   OBSTRUCTIVE SLEEP APNEA  PULMONARY HYPERTENSION  Urinary tract infection  Atrial fibrillation with rapid ventricular response  Laryngitis 1-SOB (shortness of breath): multifactorial; due to CAP, atrial fibrillation with RVR and vascular congestion. Much improved today.  Continue Azithromycin and rocephin day #4. Heart rate controlled with po cardizem. Will continue lasix that is currently at home dose. Volume status neg 5.2L. Weight stable at 129.2 KG. 2-D echo results yield  EF 55-65% with mild MR and left atrium severely dilated. Based on 2 decho fluid overload secondary to pulmonary HTN and valve regurg.  Continue incentive spirometry. Will continue scheduled nebs 1 more day and continue  tessalon. sats range 91-95 2Lnc. Will try to wean 02 given nasal irritation.   2-DIABETES, TYPE 2: A1C 6.9 and continue SSI. CBG's range 102-148; might need PO agent at discharge and close follow up with PCP (probably glipizide). Appetite improved  3-HYPERCHOLESTEROLEMIA: low fat diet. Will add lovaza for low HDL.   4-Morbid obesity: low calorie discussed with patient.   5-Depression/Anxiety:  Stable. Will continue xanax and sertraline. Patient with recent family member death; will provide grief assistance. Appreciate chaplain assistance.    6-OBSTRUCTIVE SLEEP APNEA: has refused CPAP in the past.    7-E. Coli Urinary tract infection: continue rocephin; E. Coli. Sensitive to rocephin day #4.   8-Atrial fibrillation with rapid ventricular response: rate control now. Continue cardizem dose and continue metoprolol. Not on coumadin. Will continue ASA.   9-Laryngitis: Improved today. continue voice rest and PRN cepacol.   10-mild Acute renal insufficiency: due to diuresis;  Creatinine trending down. Will continue home dose of lasix and follow Cr trend. BMET in am   11. Rash/ vaginal discharge: appreciate WOC consult. Consistent with yeast. Will give diflucan. Monitor  12-DVT: lovenox.   13. Dispo: lives at  home with husband. Is wheelchair bound. Will request PT/OT for eval. Currently has HH PT/OT/RN. Hopefully home 24-48 hours.        LOS: 3 days  Tabrina Esty [phone] 03/22/2012, 8:50 AM

## 2012-03-23 DIAGNOSIS — I4891 Unspecified atrial fibrillation: Secondary | ICD-10-CM

## 2012-03-23 DIAGNOSIS — J159 Unspecified bacterial pneumonia: Secondary | ICD-10-CM

## 2012-03-23 DIAGNOSIS — I509 Heart failure, unspecified: Secondary | ICD-10-CM

## 2012-03-23 DIAGNOSIS — E782 Mixed hyperlipidemia: Secondary | ICD-10-CM

## 2012-03-23 LAB — CBC
HCT: 38.3 % (ref 36.0–46.0)
MCHC: 33.7 g/dL (ref 30.0–36.0)
MCV: 90.8 fL (ref 78.0–100.0)
RDW: 14.1 % (ref 11.5–15.5)
WBC: 6.5 10*3/uL (ref 4.0–10.5)

## 2012-03-23 LAB — CULTURE, RESPIRATORY W GRAM STAIN: Culture: NORMAL

## 2012-03-23 LAB — BASIC METABOLIC PANEL
BUN: 21 mg/dL (ref 6–23)
Chloride: 101 mEq/L (ref 96–112)
Creatinine, Ser: 1.09 mg/dL (ref 0.50–1.10)
GFR calc Af Amer: 56 mL/min — ABNORMAL LOW (ref >90)

## 2012-03-23 MED ORDER — CEFUROXIME AXETIL 500 MG PO TABS
500.0000 mg | ORAL_TABLET | Freq: Two times a day (BID) | ORAL | Status: DC
  Administered 2012-03-23 – 2012-03-24 (×3): 500 mg via ORAL
  Filled 2012-03-23 (×5): qty 1

## 2012-03-23 NOTE — Progress Notes (Signed)
Subjective: Sitting up in bed teary and anxious appearing. Reports "im upset because my husband wont let me come home". Reports not sleeping last night and increased SOB  Objective: Vital signs Filed Vitals:   03/22/12 1936 03/22/12 2151 03/23/12 0519 03/23/12 0756  BP:  127/75 117/72   Pulse:  89 85   Temp:  98 F (36.7 C) 98.4 F (36.9 C)   TempSrc:  Oral Oral   Resp:  18 20   Height:      Weight:   126 kg (277 lb 12.5 oz)   SpO2: 92% 91% 91% 93%   Weight change: -3.2 kg (-7 lb 0.9 oz) Last BM Date: 03/22/12  Intake/Output from previous day: 05/14 0701 - 05/15 0700 In: 480 [P.O.:480] Out: 6100 [Urine:6100]     Physical Exam: General: Alert, awake, oriented x3, in no acute distress. Anxious appearing, teary.  HEENT: No bruits, no goiter. Mucus membranes mouth moist/pink.  Heart: Irregularly irregular without murmurs, rubs, gallops. Lungs: Normal effort. Improved air flow. Mild rhonchi and mild expiratory wheeze otherwise clear to auscultation bilaterally. Abdomen: Obese, Soft, nontender, nondistended, positive bowel sounds. Foley intact.  Extremities:  3+ chronic bilateral LEE from knees down. Chronic stasis changes bilaterally.  Neuro: Grossly intact, nonfocal.    Lab Results: Basic Metabolic Panel:  Basename 03/23/12 0510 03/22/12 0705  NA 135 137  K 3.5 3.4*  CL 101 105  CO2 26 25  GLUCOSE 135* 112*  BUN 21 26*  CREATININE 1.09 1.30*  CALCIUM 7.8* 7.8*  MG -- --  PHOS -- --   Liver Function Tests: No results found for this basename: AST:2,ALT:2,ALKPHOS:2,BILITOT:2,PROT:2,ALBUMIN:2 in the last 72 hours No results found for this basename: LIPASE:2,AMYLASE:2 in the last 72 hours No results found for this basename: AMMONIA:2 in the last 72 hours CBC:  Basename 03/23/12 0510 03/21/12 0450  WBC 6.5 6.7  NEUTROABS -- --  HGB 12.9 12.6  HCT 38.3 37.9  MCV 90.8 91.8  PLT 190 177   Cardiac Enzymes: No results found for this basename:  CKTOTAL:3,CKMB:3,CKMBINDEX:3,TROPONINI:3 in the last 72 hours BNP: No results found for this basename: PROBNP:3 in the last 72 hours D-Dimer: No results found for this basename: DDIMER:2 in the last 72 hours CBG:  Basename 03/23/12 0739 03/22/12 2149 03/22/12 1720 03/22/12 1212 03/22/12 0802 03/21/12 2134  GLUCAP 119* 170* 131* 132* 102* 148*   Hemoglobin A1C: No results found for this basename: HGBA1C in the last 72 hours Fasting Lipid Panel: No results found for this basename: CHOL,HDL,LDLCALC,TRIG,CHOLHDL,LDLDIRECT in the last 72 hours Thyroid Function Tests: No results found for this basename: TSH,T4TOTAL,FREET4,T3FREE,THYROIDAB in the last 72 hours Anemia Panel: No results found for this basename: VITAMINB12,FOLATE,FERRITIN,TIBC,IRON,RETICCTPCT in the last 72 hours Coagulation: No results found for this basename: LABPROT:2,INR:2 in the last 72 hours Urine Drug Screen: Drugs of Abuse  No results found for this basename: labopia, cocainscrnur, labbenz, amphetmu, thcu, labbarb    Alcohol Level: No results found for this basename: ETH:2 in the last 72 hours Urinalysis: No results found for this basename: COLORURINE:2,APPERANCEUR:2,LABSPEC:2,PHURINE:2,GLUCOSEU:2,HGBUR:2,BILIRUBINUR:2,KETONESUR:2,PROTEINUR:2,UROBILINOGEN:2,NITRITE:2,LEUKOCYTESUR:2 in the last 72 hours Misc. Labs:  Recent Results (from the past 240 hour(s))  URINE CULTURE     Status: Normal   Collection Time   03/19/12  3:39 PM      Component Value Range Status Comment   Specimen Description URINE, CLEAN CATCH   Final    Special Requests NONE   Final    Culture  Setup Time [id number]   Final  Colony Count >=100,000 COLONIES/ML   Final    Culture ESCHERICHIA COLI   Final    Report Status 03/21/2012 FINAL   Final    Organism ID, Bacteria ESCHERICHIA COLI   Final   CULTURE, RESPIRATORY     Status: Normal (Preliminary result)   Collection Time   03/20/12  8:50 PM      Component Value Range Status Comment    Specimen Description SPUTUM   Final    Special Requests NONE   Final    Gram Stain     Final    Value: ABUNDANT WBC PRESENT,BOTH PMN AND MONONUCLEAR     NO SQUAMOUS EPITHELIAL CELLS SEEN     RARE GRAM POSITIVE COCCI     IN PAIRS RARE GRAM NEGATIVE RODS     RARE GRAM POSITIVE RODS   Culture Culture reincubated for better growth   Final    Report Status PENDING   Incomplete   CULTURE, EXPECTORATED SPUTUM-ASSESSMENT     Status: Normal   Collection Time   03/20/12  8:52 PM      Component Value Range Status Comment   Specimen Description SPUTUM   Final    Special Requests NONE   Final    Sputum evaluation     Final    Value: THIS SPECIMEN IS ACCEPTABLE. RESPIRATORY CULTURE REPORT TO FOLLOW.   Report Status 03/20/2012 FINAL   Final     Studies/Results: No results found.  Medications: Scheduled Meds:   . aspirin EC  325 mg Oral Daily  . azithromycin  500 mg Oral Q24H  . benzonatate  100 mg Oral BID  . cefTRIAXone (ROCEPHIN)  IV  1 g Intravenous Q24H  . cholecalciferol  1,000 Units Oral Daily  . diltiazem  180 mg Oral Daily  . enoxaparin  40 mg Subcutaneous Q24H  . fluconazole  150 mg Oral Daily  . furosemide  80 mg Oral BID  . insulin aspart  0-9 Units Subcutaneous TID WC  . levalbuterol  0.63 mg Nebulization TID  . metoprolol  25 mg Oral BID  . omega-3 acid ethyl esters  1 g Oral BID  . potassium chloride SA  20 mEq Oral BID  . sertraline  100 mg Oral QHS  . sodium chloride  3 mL Intravenous Q12H  . DISCONTD: azithromycin  500 mg Intravenous Q24H   Continuous Infusions:  PRN Meds:.sodium chloride, acetaminophen, acetaminophen, ALPRAZolam, guaiFENesin-dextromethorphan, menthol-cetylpyridinium, morphine injection, ondansetron (ZOFRAN) IV, ondansetron, sodium chloride  Assessment/Plan:  Principal Problem:  *SOB (shortness of breath) Active Problems:  DIABETES, TYPE 2  HYPERCHOLESTEROLEMIA  Morbid obesity  ANXIETY  OBSTRUCTIVE SLEEP APNEA  PULMONARY HYPERTENSION   Urinary tract infection  Atrial fibrillation with rapid ventricular response  Laryngitis 1-SOB (shortness of breath): multifactorial; due to CAP, atrial fibrillation with RVR and vascular congestion. Continues to improve inspite of emmotional upset.  Azithromycin and rocephin day changed to po . Heart rate controlled with po cardizem. Will continue lasix that is currently at home dose. Volume status neg 8.8L. Weight stable at 126 KG. 2-D echo results yield EF 55-65% with mild MR and left atrium severely dilated. Based on 2 decho fluid overload secondary to pulmonary HTN and valve regurg. Continue incentive spirometry. Will continue scheduled nebs 1 more day and continue tessalon. sats range 91-95 on room air.  2-DIABETES, TYPE 2: A1C 6.9 and continue SSI. CBG's range 102-148; might need PO agent at discharge and close follow up with PCP (probably glipizide). Appetite improved  3-HYPERCHOLESTEROLEMIA:  low fat diet. Lovaza for low HDL.  4-Morbid obesity: low calorie discussed with patient.  5-Depression/Anxiety: Anxious/teary as husband "wont let me come home." Discussed at length pts weakened state and husbands inability to provide care. Pt aknowledged weakened state but plans to "go home ".  Will continue xanax and sertraline. Patient with recent family member death; will provide grief assistance. Appreciate chaplain assistance.  6-OBSTRUCTIVE SLEEP APNEA: has refused CPAP in the past.  7-E. Coli Urinary tract infection: Change rocephin to ceftin;  E. Coli. Sensitive to ceftine day #5.  8-Atrial fibrillation with rapid ventricular response: rate control now. Continue cardizem dose and continue metoprolol. Not on coumadin. Will continue ASA.  9-Laryngitis:continues to improve. continue voice rest and PRN cepacol.  10-mild Acute renal insufficiency: due to diuresis; Creatinine trending down and WNL today.  Will continue home dose of lasix. 11. Rash/ vaginal discharge: appreciate WOC consult. Consistent  with yeast. Will give diflucan. Monitor  12-DVT: lovenox.   13. Dispo: will be medically ready for discharge 24 48 hours. Will request SW to see and fax out in case pt agrees to rehab. Otherwise will need HH .       LOS: 4 days   [facility] M 03/23/2012, 9:06 AM

## 2012-03-23 NOTE — Progress Notes (Signed)
Pt was seen and examined at bedside. I have reviewed labs and vitals which are stable and pt is hemodynamically stable. I agree with the above's assessment and plan.   [facility], pager #: [phone] Main office number: [phone]  Debbora Presto

## 2012-03-23 NOTE — Progress Notes (Signed)
CSW spoke with patients spouse, [phone], discussed need for SNF. Requesting [facility]. FL2 completed and faxed out.  Tara Huff C. Tara Huff MSW, LCSW [phone]

## 2012-03-23 NOTE — Progress Notes (Signed)
Occupational Therapy Note Chart reviewed. Spoke with patient who politely asked to get up later. She states she didn't sleep well last night due to a cough. Will try back later as schedule permits. Tara Huff OTR/L [phone] 03/23/2012

## 2012-03-24 DIAGNOSIS — I4891 Unspecified atrial fibrillation: Secondary | ICD-10-CM

## 2012-03-24 DIAGNOSIS — J159 Unspecified bacterial pneumonia: Secondary | ICD-10-CM

## 2012-03-24 DIAGNOSIS — E782 Mixed hyperlipidemia: Secondary | ICD-10-CM

## 2012-03-24 DIAGNOSIS — I509 Heart failure, unspecified: Secondary | ICD-10-CM

## 2012-03-24 LAB — BASIC METABOLIC PANEL
CO2: 26 mEq/L (ref 19–32)
Calcium: 8.3 mg/dL — ABNORMAL LOW (ref 8.4–10.5)
Creatinine, Ser: 0.97 mg/dL (ref 0.50–1.10)
Glucose, Bld: 125 mg/dL — ABNORMAL HIGH (ref 70–99)

## 2012-03-24 LAB — CBC
HCT: 39.2 % (ref 36.0–46.0)
Hemoglobin: 13.1 g/dL (ref 12.0–15.0)
MCH: 30.3 pg (ref 26.0–34.0)
MCV: 90.5 fL (ref 78.0–100.0)
RBC: 4.33 MIL/uL (ref 3.87–5.11)

## 2012-03-24 LAB — GLUCOSE, CAPILLARY: Glucose-Capillary: 115 mg/dL — ABNORMAL HIGH (ref 70–99)

## 2012-03-24 MED ORDER — POTASSIUM CHLORIDE CRYS ER 20 MEQ PO TBCR
40.0000 meq | EXTENDED_RELEASE_TABLET | Freq: Once | ORAL | Status: AC
  Administered 2012-03-24: 40 meq via ORAL
  Filled 2012-03-24: qty 2

## 2012-03-24 MED ORDER — GUAIFENESIN-DM 100-10 MG/5ML PO SYRP: 5.0000 mL | ORAL_SOLUTION | ORAL | Status: AC | PRN

## 2012-03-24 MED ORDER — MENTHOL 3 MG MT LOZG: 1.0000 | LOZENGE | OROMUCOSAL | Status: DC | PRN

## 2012-03-24 MED ORDER — ACETAMINOPHEN 325 MG PO TABS: 650.0000 mg | ORAL_TABLET | Freq: Four times a day (QID) | ORAL | Status: AC | PRN

## 2012-03-24 MED ORDER — BENZONATATE 100 MG PO CAPS: 100.0000 mg | ORAL_CAPSULE | Freq: Two times a day (BID) | ORAL | Status: AC

## 2012-03-24 MED ORDER — ASPIRIN 325 MG PO TBEC: 325.0000 mg | DELAYED_RELEASE_TABLET | Freq: Every day | ORAL | Status: AC

## 2012-03-24 MED ORDER — LEVALBUTEROL HCL 0.63 MG/3ML IN NEBU: 0.6300 mg | INHALATION_SOLUTION | Freq: Three times a day (TID) | RESPIRATORY_TRACT | Status: DC | PRN

## 2012-03-24 MED ORDER — CEFUROXIME AXETIL 500 MG PO TABS: ORAL_TABLET | ORAL | Status: DC

## 2012-03-24 MED ORDER — AZITHROMYCIN 500 MG PO TABS: ORAL_TABLET | ORAL | Status: DC

## 2012-03-24 NOTE — Discharge Instructions (Signed)
Will need oxygen 2L [place]. Keep sats 90% or greater.

## 2012-03-24 NOTE — Progress Notes (Addendum)
Patient cleared for discharge. Discussed with patients spouse. He is agreeable to [facility]. [facility]. Packet copied and placed in [place]. [facility] called for transportation. Patient aware and agreeable to same.  Alexee Delsanto C. Jumaane Weatherford MSW, LCSW [phone]

## 2012-03-24 NOTE — Discharge Summary (Addendum)
Physician Discharge Summary  Patient ID: Tara Huff MRN: [id number] DOB/AGE: [id number] 76 y.o.  Admit date: 03/19/2012 Discharge date: 03/24/2012  Primary Care Physician:  Allean Found, MD, MD   Discharge Diagnoses:  Shortness of breath secondary to CAP, atrial fibrillation with RVR, pulmonary vascular congestion  Principal Problem:  *SOB (shortness of breath) Active Problems:  DIABETES, TYPE 2  HYPERCHOLESTEROLEMIA  Morbid obesity  ANXIETY  OBSTRUCTIVE SLEEP APNEA  PULMONARY HYPERTENSION  Urinary tract infection  Atrial fibrillation with rapid ventricular response  Laryngitis   Medication List  As of 03/24/2012  1:32 PM   TAKE these medications         acetaminophen 325 MG tablet   Commonly known as: TYLENOL   Take 2 tablets (650 mg total) by mouth every 6 (six) hours as needed (or Fever >/= 101).      ALPRAZolam 0.25 MG tablet   Commonly known as: XANAX   Take 0.25 mg by mouth daily as needed. For anxiety      aspirin 325 MG EC tablet   Take 1 tablet (325 mg total) by mouth daily.      azithromycin 500 MG tablet   Commonly known as: ZITHROMAX   Last dose 03/28/12      benzonatate 100 MG capsule   Commonly known as: TESSALON   Take 1 capsule (100 mg total) by mouth 2 (two) times daily.      cefUROXime 500 MG tablet   Commonly known as: CEFTIN   Last dose 03/28/12      diltiazem 180 MG 24 hr capsule   Commonly known as: CARDIZEM CD   Take 180 mg by mouth daily.      furosemide 80 MG tablet   Commonly known as: LASIX   Take 80 mg by mouth 2 (two) times daily.      guaiFENesin-dextromethorphan 100-10 MG/5ML syrup   Commonly known as: ROBITUSSIN DM   Take 5 mLs by mouth every 4 (four) hours as needed for cough.      levalbuterol 0.63 MG/3ML nebulizer solution   Commonly known as: XOPENEX   Take 3 mLs (0.63 mg total) by nebulization every 8 (eight) hours as needed for wheezing.      menthol-cetylpyridinium 3 MG lozenge   Commonly known as:  CEPACOL   Take 1 lozenge (3 mg total) by mouth as needed.      metoprolol 50 MG tablet   Commonly known as: LOPRESSOR   Take 25 mg by mouth 2 (two) times daily.      potassium chloride SA 20 MEQ tablet   Commonly known as: K-DUR,KLOR-CON   Take 20 mEq by mouth 2 (two) times daily.      sertraline 100 MG tablet   Commonly known as: ZOLOFT   Take 100 mg by mouth at bedtime.      Vitamin D 1000 UNITS capsule   Take 1,000 Units by mouth daily.           Disposition and Follow-up: Pt medically stable and ready for discharge to facility. Will need PT at lease daily. Follow up with PCP 1 week. Please check BMP to check potassium and creatinine level given Lasix use. Readjust the regimen as indicated in terms of potassium dosing. Please note that pt will need to be on oxygen 2 L continuous secondary to low oxygen saturations at rest and with activity.  Consults:  None  Significant Diagnostic Studies:  Dg Chest Port 1 View 03/19/2012    IMPRESSION:  Borderline cardiomegaly.  Mild interstitial prominence bilaterally without convincing pulmonary edema.  Patchy atelectasis or infiltrate in the lingula.  Physical Exam General: Alert, awake, oriented x3, in no acute distress. Marland Kitchen  HEENT: No bruits, no goiter. Mucus membranes mouth moist/pink.  Heart: Irregularly irregular without murmurs, rubs, gallops.  Lungs: Normal effort. Improved air flow. Mild rhonchi and mild expiratory wheeze otherwise clear to auscultation bilaterally.  Abdomen: Obese, Soft, nontender, nondistended, positive bowel sounds.  Extremities: 3+ chronic bilateral LEE from knees down. Chronic stasis changes bilaterally.  Neuro: Grossly intact, nonfocal. Speech clear. Facial symmetry  Lab 03/24/12 0545 03/23/12 0510 03/21/12 0450 03/20/12 0327 03/19/12 1511  WBC 6.0 6.5 6.7 7.9 10.9*  HGB 13.1 12.9 12.6 12.3 14.4  HCT 39.2 38.3 37.9 37.1 41.9  PLT 184 190 177 178 175    Lab 03/24/12 0545 03/23/12 0510 03/22/12 0705  03/21/12 0450 03/20/12 0327  NA 136 135 137 135 136  K 3.4* 3.5 3.4* 3.7 4.1  CL 101 101 105 102 105  CO2 26 26 25 22 22   GLUCOSE 125* 135* 112* 116* 120*  BUN 17 21 26* 29* 24*  CREATININE 0.97 1.09 1.30* 1.35* 1.18*  CALCIUM 8.3* 7.8* 7.8* 8.2* 8.1*    Lab 03/19/12 1511  INR 1.36  PROTIME --   Cardiac markers:  Lab 03/20/12 0327 03/19/12 1953 03/19/12 1511  CKMB 2.7 3.1 --  TROPONINI <0.30 <0.30 <0.30  MYOGLOBIN -- -- --    URINALYSIS, ROUTINE W REFLEX MICROSCOPIC - Abnormal; Notable for the following:    Color, Urine AMBER (*)    APPearance CLOUDY (*)    Ketones, ur TRACE (*)    Nitrite POSITIVE (*)    Leukocytes, UA MODERATE (*)    Bacteria, UA MANY (*)    PRO B NATRIURETIC PEPTIDE - Abnormal; Notable for the following:    Pro B Natriuretic peptide (BNP) 6197.0 (*)   HEMOGLOBIN A1C - Abnormal; Notable for the following:    Hemoglobin A1C 6.9 (*)     Brief H and P: For complete details please refer to admission H and P, but in brief 78 y/o female with pmh of obesity, HTN, DM, atrial fibrillation and pulmonary hypertension; who came to the hospital on 03/19/12 complaining 2-3 days of increased SOB, non-productive cough and leg swelling. Patient reported symptoms started suddenly and has continue worsening; she has not missed any of her medication according to her and denies sick contacts.  Patient endorsed sore throat and hoarseness; also no bloody diarrhea; increase frequency but not dysuria and associated nausea/vomiting (non-bloody).  In the ED patient was found with mild elevation of WBC's, CXR suggesting lingula infiltrate and vascular congestion; UA with findings suggesting UTI (positive nitrite, moderate Leukocytes) and also low grade fever. TRH was call to admit for further evaluation and treatment.  Patient was in atrial fibrillation with RVR, responded well to use of iv cardizem and cardizem drip. Pt admitted to medicine service.   Hospital Course:   Principal  Problem:  *SOB (shortness of breath) Active Problems:  DIABETES, TYPE 2  HYPERCHOLESTEROLEMIA  Morbid obesity  ANXIETY  OBSTRUCTIVE SLEEP APNEA  PULMONARY HYPERTENSION  Urinary tract infection  Atrial fibrillation with rapid ventricular response  Laryngitis  1. SOB (shortness of breath): multifactorial; due to CAP, atrial fibrillation with RVR and vascular congestion. Pt admitted to tele. Started on IV antibiotics and IV lasix. She improved inspite of emmotional upset.  Will be discharged with po antibiotics until 03/27/12 to complete 10 day  course.  Heart rate controlled with po cardizem. Will continue lasix that is currently at home dose. Volume status neg 12L at discharge.  Weight stable at 125.2kg from 130.7 on admission.  2-D echo results yield EF 55-65% with mild MR and left atrium severely dilated. Based on 2 decho fluid overload secondary to pulmonary HTN and valve regurg. Likely etiology for SOB.nge 91-95 on room air.  Oxygen saturation checked on discharge today and was indicative of need for home oxygen use continuous and 2 L.  2. DIABETES, TYPE 2: A1C 6.9  Manage with SSi during hospitalization. . CBG's range 102-148; Recommend  close follow up with PCP (probably glipizide). Appetite improved   3. HYPERCHOLESTEROLEMIA: low fat diet. [place] for low HDL.   4. Morbid obesity: low calorie discussed with patient.   5. Depression/Anxiety: Anxious/teary as husband "wont let me come home." Discussed at length pts weakened state and husband's inability to provide care. Pt aknowledged weakened state. Will continue xanax and sertraline. Patient with recent family member death; will provide grief assistance. Appreciate chaplain assistance.   6. OBSTRUCTIVE SLEEP APNEA: has refused CPAP in the past.   7. E. Coli Urinary tract infection: Change rocephin to ceftin; E. Coli. . Last dose 03/27/12 to complete 10 day course.   8. Atrial fibrillation with rapid ventricular response: rate control.  Continue cardizem dose and continue metoprolol. Not on coumadin. Will continue ASA.   9. Laryngitis:continues to improve. continue voice rest and PRN cepacol.   10. Mild Acute renal insufficiency: due to diuresis; Creatinine trending down and WNL at discharge.  Will continue home dose of lasix.   11. Rash/ vaginal discharge: appreciate WOC consult. Consistent with yeast. Will give diflucan.    Time spent on Discharge: 40 minutes  Signed: Gwenyth Bender 03/24/2012, 1:32 PM  MAGICK-Lowery Paullin  [facility], pager #: [phone] Main office number: [phone]

## 2012-03-24 NOTE — Progress Notes (Signed)
Spoke with [place] from SW, per [place], pt's chart is being copied and paperwork has been faxed to the SNF, plan is that SW will call for ambulance transport at 3pm, updated pt.

## 2012-03-24 NOTE — Progress Notes (Addendum)
Report called to [facility], [facility] here to transport

## 2012-03-24 NOTE — Progress Notes (Signed)
Chart reviewed and plan is for pt. To discharge to SNF today, or tomorrow.  Will defer OT  eval to SNF.  If discharge plan changes, will initiate eval.  Thanks!  Jeani Hawking, OTR/L [phone]

## 2012-03-24 NOTE — Care Management Note (Signed)
    Page 1 of 1   03/24/2012     1:17:10 PM   CARE MANAGEMENT NOTE 03/24/2012  Patient:  Tara Huff, Tara Huff   Account Number:  [id number]  Date Initiated:  03/21/2012  Documentation initiated by:  PEARSON,COOKIE  Subjective/Objective Assessment:   pt admitted with cco SOB, UTI, elevation of WBC's     Action/Plan:   from home   Anticipated DC Date:  03/24/2012   Anticipated DC Plan:  SKILLED NURSING FACILITY  In-house referral  Clinical Social Worker      DC Planning Services  CM consult      Choice offered to / List presented to:             Status of service:  Completed, signed off Medicare Important Message given?  NO (If response is "NO", the following Medicare IM given date fields will be blank) Date Medicare IM given:   Date Additional Medicare IM given:    Discharge Disposition:  SKILLED NURSING FACILITY  Per UR Regulation:  Reviewed for med. necessity/level of care/duration of stay  If discussed at Long Length of Stay Meetings, dates discussed:    Comments:  03/24/12 Lanier Clam RN,BSN NCM [phone] D/C SNF.  03/21/12 MPearson, RN, BSN Chart reviewed.

## 2012-03-24 NOTE — Progress Notes (Signed)
Notified MD that pt was 89% on RA this am and pt was placed on 2l [place] O2, 91% on 2l [place] O2

## 2012-05-11 ENCOUNTER — Encounter (HOSPITAL_COMMUNITY): Payer: Self-pay

## 2012-05-11 ENCOUNTER — Emergency Department (HOSPITAL_COMMUNITY)
Admission: EM | Admit: 2012-05-11 | Discharge: 2012-05-11 | Disposition: A | Discharge status: Home / Self Care | Payer: Medicare Other | Attending: Emergency Medicine | Admitting: Emergency Medicine

## 2012-05-11 DIAGNOSIS — I10 Essential (primary) hypertension: Secondary | ICD-10-CM | POA: Insufficient documentation

## 2012-05-11 DIAGNOSIS — I252 Old myocardial infarction: Secondary | ICD-10-CM | POA: Insufficient documentation

## 2012-05-11 DIAGNOSIS — I4891 Unspecified atrial fibrillation: Secondary | ICD-10-CM

## 2012-05-11 DIAGNOSIS — E119 Type 2 diabetes mellitus without complications: Secondary | ICD-10-CM | POA: Insufficient documentation

## 2012-05-11 DIAGNOSIS — Z79899 Other long term (current) drug therapy: Secondary | ICD-10-CM | POA: Insufficient documentation

## 2012-05-11 DIAGNOSIS — Z87891 Personal history of nicotine dependence: Secondary | ICD-10-CM | POA: Insufficient documentation

## 2012-05-11 DIAGNOSIS — F411 Generalized anxiety disorder: Secondary | ICD-10-CM | POA: Insufficient documentation

## 2012-05-11 LAB — CBC WITH DIFFERENTIAL/PLATELET
Basophils Absolute: 0 10*3/uL (ref 0.0–0.1)
Basophils Relative: 1 % (ref 0–1)
Eosinophils Absolute: 0.1 10*3/uL (ref 0.0–0.7)
Eosinophils Relative: 3 % (ref 0–5)
HCT: 35.2 % — ABNORMAL LOW (ref 36.0–46.0)
Hemoglobin: 11.8 g/dL — ABNORMAL LOW (ref 12.0–15.0)
Lymphocytes Relative: 19 % (ref 12–46)
Lymphs Abs: 0.8 10*3/uL (ref 0.7–4.0)
MCH: 30.1 pg (ref 26.0–34.0)
MCHC: 33.5 g/dL (ref 30.0–36.0)
MCV: 89.8 fL (ref 78.0–100.0)
Monocytes Absolute: 0.5 10*3/uL (ref 0.1–1.0)
Monocytes Relative: 11 % (ref 3–12)
Neutro Abs: 2.8 10*3/uL (ref 1.7–7.7)
Neutrophils Relative %: 66 % (ref 43–77)
Platelets: 144 10*3/uL — ABNORMAL LOW (ref 150–400)
RBC: 3.92 MIL/uL (ref 3.87–5.11)
RDW: 16 % — ABNORMAL HIGH (ref 11.5–15.5)
WBC: 4.2 10*3/uL (ref 4.0–10.5)

## 2012-05-11 LAB — POCT I-STAT, CHEM 8
BUN: 31 mg/dL — ABNORMAL HIGH (ref 6–23)
Calcium, Ion: 1.21 mmol/L (ref 1.12–1.32)
Chloride: 105 mEq/L (ref 96–112)
Glucose, Bld: 92 mg/dL (ref 70–99)
HCT: 36 % (ref 36.0–46.0)
Potassium: 4.1 mEq/L (ref 3.5–5.1)

## 2012-05-11 LAB — APTT: aPTT: 37 seconds (ref 24–37)

## 2012-05-11 LAB — PRO B NATRIURETIC PEPTIDE: Pro B Natriuretic peptide (BNP): 2429 pg/mL — ABNORMAL HIGH (ref 0–450)

## 2012-05-11 LAB — PROTIME-INR
INR: 1.18 (ref 0.00–1.49)
Prothrombin Time: 15.3 seconds — ABNORMAL HIGH (ref 11.6–15.2)

## 2012-05-11 LAB — POCT I-STAT TROPONIN I: Troponin i, poc: 0 ng/mL (ref 0.00–0.08)

## 2012-05-11 MED ORDER — DILTIAZEM HCL ER COATED BEADS 180 MG PO CP24
180.0000 mg | ORAL_CAPSULE | ORAL | Status: AC
  Administered 2012-05-11: 180 mg via ORAL
  Filled 2012-05-11: qty 1

## 2012-05-11 MED ORDER — DILTIAZEM HCL ER COATED BEADS 180 MG PO CP24: 180.0000 mg | ORAL_CAPSULE | Freq: Every day | ORAL | Status: DC

## 2012-05-11 NOTE — ED Notes (Signed)
Pt presents for evaluation of afib that was noted in her doctor's office today.  Pt was there for follow up for rehab from chf.  Pt denies any pain, discomfort or shortness of breath.

## 2012-05-11 NOTE — ED Notes (Addendum)
Pt. Reports going to her PCP today for regular visit and was sent to the ED for "heart problems". At trigage pt. Presented with a-fib. Rate currently between 95-100. Denies chest pain, Denies fluttering, denies tightness. Denies SOB. 100% on RA. Reports no cardiac history. Chart reflects hxt of a-fib and MI.  Reports "my legs started swelling over night. I had no swelling yesterday". Reports being off her medications for a month. Unaware of which ones. Husband has medical hxt and will arrival soon with information. Reports discomfort related to leg swelling, denies any other pain. A.O x 4. NAD.

## 2012-05-11 NOTE — ED Provider Notes (Signed)
History     CSN: [id number]  Arrival date & time 05/11/12  1359   First MD Initiated Contact with Patient 05/11/12 1530      No chief complaint on file.   (Consider location/radiation/quality/duration/timing/severity/associated sxs/prior treatment) Patient is a 78 y.o. female presenting with general illness. The history is provided by the patient.  Illness  The current episode started today. The onset was sudden. The problem occurs continuously. The problem has been unchanged. The problem is moderate. Nothing relieves the symptoms. Nothing aggravates the symptoms. Pertinent negatives include no fever, no decreased vision, no double vision, no photophobia, no abdominal pain, no nausea, no vomiting, no congestion, no headaches, no cough, no URI and no rash. Recently, medical care has been given by a specialist. Services received include tests performed.    Past Medical History  Diagnosis Date  . Hypertension   . Diabetes mellitus   . Atrial fibrillation   . MI (myocardial infarction)   . Anxiety     Past Surgical History  Procedure Date  . Knee surgery   . Cataract surgery   . Abdominal hysterectomy   . Toe amputation     No family history on file.  History  Substance Use Topics  . Smoking status: Former Games developer  . Smokeless tobacco: Not on file  . Alcohol Use: No    OB History    Grav Para Term Preterm Abortions TAB SAB Ect Mult Living                  Review of Systems  Constitutional: Negative for fever and chills.  HENT: Negative for congestion.   Eyes: Negative for double vision and photophobia.  Respiratory: Negative for cough, chest tightness and shortness of breath.   Cardiovascular: Positive for leg swelling. Negative for chest pain and palpitations.  Gastrointestinal: Negative for nausea, vomiting and abdominal pain.  Musculoskeletal: Negative for back pain.  Skin: Negative for color change and rash.  Neurological: Negative for weakness,  light-headedness and headaches.  All other systems reviewed and are negative.    Allergies  Review of patient's allergies indicates no known allergies.  Home Medications   Current Outpatient Rx  Name Route Sig Dispense Refill  . ACETAMINOPHEN 325 MG PO TABS Oral Take 2 tablets (650 mg total) by mouth every 6 (six) hours as needed (or Fever >/= 101).    Marland Kitchen ALPRAZOLAM 0.25 MG PO TABS Oral Take 0.25 mg by mouth daily as needed. For anxiety    . AZITHROMYCIN 500 MG PO TABS  Last dose 03/28/12    . CEFUROXIME AXETIL 500 MG PO TABS  Last dose 03/28/12    . VITAMIN D 1000 UNITS PO CAPS Oral Take 1,000 Units by mouth daily.      Marland Kitchen DILTIAZEM HCL ER COATED BEADS 180 MG PO CP24 Oral Take 180 mg by mouth daily.      . FUROSEMIDE 80 MG PO TABS Oral Take 80 mg by mouth 2 (two) times daily.     Marland Kitchen LEVALBUTEROL HCL 0.63 MG/3ML IN NEBU Nebulization Take 3 mLs (0.63 mg total) by nebulization every 8 (eight) hours as needed for wheezing. 3 mL   . MENTHOL 3 MG MT LOZG Oral Take 1 lozenge (3 mg total) by mouth as needed. 100 tablet   . METOPROLOL TARTRATE 50 MG PO TABS Oral Take 25 mg by mouth 2 (two) times daily.      Marland Kitchen POTASSIUM CHLORIDE CRYS ER 20 MEQ PO TBCR Oral Take 20  mEq by mouth 2 (two) times daily.      . SERTRALINE HCL 100 MG PO TABS Oral Take 100 mg by mouth at bedtime.        BP 99/67  Pulse 106  Temp 97.8 F (36.6 C) (Oral)  Resp 22  SpO2 99%  Physical Exam  Nursing note and vitals reviewed. Constitutional: She is oriented to person, place, and time. She appears well-developed and well-nourished. No distress.  HENT:  Head: Normocephalic and atraumatic.  Eyes: Pupils are equal, round, and reactive to light.  Cardiovascular: Normal heart sounds and intact distal pulses.  An irregularly irregular rhythm present. Tachycardia present.   Pulmonary/Chest: Effort normal and breath sounds normal. No respiratory distress.  Abdominal: Soft. She exhibits no distension. There is no tenderness.    Musculoskeletal: She exhibits edema (significant, bilateral, mild overlying erythema, which pt says is chronic; do not appear acutely infected).  Neurological: She is alert and oriented to person, place, and time.  Skin: Skin is warm and dry.  Psychiatric: She has a normal mood and affect.    ED Course  Procedures (including critical care time)  Labs Reviewed  CBC WITH DIFFERENTIAL - Abnormal; Notable for the following:    Hemoglobin 11.8 (*)     HCT 35.2 (*)     RDW 16.0 (*)     Platelets 144 (*)     All other components within normal limits  PROTIME-INR - Abnormal; Notable for the following:    Prothrombin Time 15.3 (*)     All other components within normal limits  POCT I-STAT, CHEM 8 - Abnormal; Notable for the following:    BUN 31 (*)     All other components within normal limits  PRO B NATRIURETIC PEPTIDE - Abnormal; Notable for the following:    Pro B Natriuretic peptide (BNP) 2429.0 (*)     All other components within normal limits  APTT  POCT I-STAT TROPONIN I   No results found.   1. Atrial fibrillation       MDM  A 78 year old female who presents today from her PCPs office with A. fib and RVR. The patient reports that "a long time ago" she had a funny heart rhythm for which he has been on medications for years. She did run out of that medication a couple of weeks ago, and was seeing her regular doctor today to get it refilled. She denies any chest pain, shortness of breath, racing heart, palpitations but does note significant increase in her baseline lower extremity edema since yesterday, but says that her legs are not as swollen as they have previously been. She does note having gained about 8 pounds since stopping cardiac rehabilitation about 2-3 weeks ago. Initially here her heart rate was in the 120s, with which is what it was at her regular doctor's office, however she is jumping around between the 90s and 120s. She is unable to tell when her heart rate  increases. On exam, she is noted to have an irregularly irregular rhythm, and is noted to have significant edema of the bilateral lower extremities. Since the patient is symptom-free at this point in time, do not think that she requires any emergent medications for cardioversion. We'll check basic labs, including BNP, and restart the patient on her home medications. If her BNP is significantly elevated, which is possible due to decreased efficiency of cardiac output with intermittent tachycardia from her uncontrolled A. fib, this could be contributing to the significant increase in  her lower edema. We will have the patient follow up closely with her regular doctor and her cardiologist for further management of her A. fib.   The patient had a mild elevation of her BNP, but it is not nearly as elevated as it has been previously. The remainder of her labs are unremarkable. Discussed again with patient restarting her medication, and close followup with her cardiologist for further management. Discussed worsening or changing symptoms for which he should return the patient expressed understanding.   Theotis Burrow, MD 05/12/12 [phone]

## 2012-05-19 NOTE — ED Provider Notes (Signed)
I saw and evaluated the patient, reviewed the resident's note and I agree with the findings and plan. I personally reviewed the patient's EKG.  78 year old female referred from her physician's office for a short fibrillation with rapid ventricular rate. Patient with a history of A. fib. Has not been on her rate controlling medications for several weeks. She is actually seeing her Dr. today to have this refilled. Rate running around 90-100 prior to discharge. Irregularly irregular rhythm without murmur on exam. Lungs are clear. She is not complaining of chest pain or shortness of breath. She does have some lower extremity edema though. BNP is elevated, otherwise her labs are fairly unremarkable. Plan restart patient on her home medications. Return precautions were discussed. Close outpatient followup with her PCP and her cardiologist.  Raeford Razor, MD 05/19/12 1021

## 2012-07-18 ENCOUNTER — Other Ambulatory Visit (HOSPITAL_COMMUNITY): Payer: Self-pay | Admitting: Cardiology

## 2012-07-18 DIAGNOSIS — J449 Chronic obstructive pulmonary disease, unspecified: Secondary | ICD-10-CM

## 2012-07-22 ENCOUNTER — Ambulatory Visit (HOSPITAL_COMMUNITY)
Admission: RE | Admit: 2012-07-22 | Discharge: 2012-07-22 | Disposition: A | Discharge status: Home / Self Care | Payer: Medicare Other | Source: Ambulatory Visit | Attending: Cardiology | Admitting: Cardiology

## 2012-07-22 DIAGNOSIS — J449 Chronic obstructive pulmonary disease, unspecified: Secondary | ICD-10-CM | POA: Insufficient documentation

## 2012-07-22 DIAGNOSIS — J4489 Other specified chronic obstructive pulmonary disease: Secondary | ICD-10-CM | POA: Insufficient documentation

## 2012-07-22 LAB — PULMONARY FUNCTION TEST

## 2012-07-22 MED ORDER — ALBUTEROL SULFATE (5 MG/ML) 0.5% IN NEBU
2.5000 mg | INHALATION_SOLUTION | Freq: Once | RESPIRATORY_TRACT | Status: AC
  Administered 2012-07-22: 2.5 mg via RESPIRATORY_TRACT

## 2012-08-10 ENCOUNTER — Encounter: Payer: Self-pay | Admitting: Critical Care Medicine

## 2012-08-10 ENCOUNTER — Encounter: Payer: Self-pay | Admitting: Specialist

## 2012-08-11 ENCOUNTER — Encounter: Payer: Self-pay | Admitting: Critical Care Medicine

## 2012-08-11 ENCOUNTER — Ambulatory Visit (INDEPENDENT_AMBULATORY_CARE_PROVIDER_SITE_OTHER): Payer: Medicare Other | Admitting: Critical Care Medicine

## 2012-08-11 VITALS — BP 110/60 | HR 73 | Temp 97.4°F | Ht 68.0 in | Wt 233.5 lb

## 2012-08-11 DIAGNOSIS — M869 Osteomyelitis, unspecified: Secondary | ICD-10-CM

## 2012-08-11 DIAGNOSIS — I10 Essential (primary) hypertension: Secondary | ICD-10-CM

## 2012-08-11 DIAGNOSIS — G4730 Sleep apnea, unspecified: Secondary | ICD-10-CM

## 2012-08-11 DIAGNOSIS — E119 Type 2 diabetes mellitus without complications: Secondary | ICD-10-CM

## 2012-08-11 DIAGNOSIS — I219 Acute myocardial infarction, unspecified: Secondary | ICD-10-CM

## 2012-08-11 DIAGNOSIS — G4733 Obstructive sleep apnea (adult) (pediatric): Secondary | ICD-10-CM

## 2012-08-11 DIAGNOSIS — I2789 Other specified pulmonary heart diseases: Secondary | ICD-10-CM

## 2012-08-11 DIAGNOSIS — E7800 Pure hypercholesterolemia, unspecified: Secondary | ICD-10-CM

## 2012-08-11 NOTE — Progress Notes (Signed)
Subjective:    Patient ID: Tara Huff, female    DOB: 07/31/1934, 78 y.o.   MRN: [id number]  HPI Chronic LE edema and PUlm HTN on echo.  PFTs done to r/o copd.  ++OSA.   LE u/s neg for DVT No dyspnea currently.  No real chest pain  .   Pt was in [facility] post d/c.  03/2012.    Now home for 90days.  Had Cap and CHF and Afib RVR.  Pt has Scientist, research (life sciences).  Now gets therapy in the home.  No wheezing.  Pt denies pndrip.  Nose is stopped up and clearing the nose.  Edema is less in the LE.  Past Medical History  Diagnosis Date  . Hypertension   . Diabetes mellitus   . Atrial fibrillation   . MI (myocardial infarction)   . Anxiety   . Hypercholesterolemia   . Shoulder pain   . Sleep apnea     intolerant to CPAP  . Type 2 diabetes mellitus   . OSA (obstructive sleep apnea)   . Hypertensive heart disease   . Morbid obesity   . Pyogenic granuloma   . Cancer     cervical     Family History  Problem Relation Age of Onset  . Emphysema Father   . Cancer Mother     intestinal     History   Social History  . Marital Status: Married    Spouse Name: N/A    Number of Children: 1  . Years of Education: N/A   Occupational History  . retired    Social History Main Topics  . Smoking status: Former Smoker -- 1.0 packs/day for 20 years    Types: Cigarettes    Quit date: 11/09/1992  . Smokeless tobacco: Never Used  . Alcohol Use: No  . Drug Use: No  . Sexually Active: Not on file   Other Topics Concern  . Not on file   Social History Narrative  . No narrative on file     No Known Allergies   Outpatient Prescriptions Prior to Visit  Medication Sig Dispense Refill  . acetaminophen (TYLENOL) 325 MG tablet Take 2 tablets (650 mg total) by mouth every 6 (six) hours as needed (or Fever >/= 101).      Marland Kitchen ALPRAZolam (XANAX) 0.25 MG tablet Take 0.25 mg by mouth daily as needed. For anxiety      . Cholecalciferol (VITAMIN D) 1000 UNITS capsule Take 1,000 Units by mouth daily.         Marland Kitchen diltiazem (CARDIZEM CD) 180 MG 24 hr capsule Take 1 capsule (180 mg total) by mouth daily.  30 capsule  0  . furosemide (LASIX) 80 MG tablet Take 40 mg by mouth 2 (two) times daily.       . metoprolol (LOPRESSOR) 50 MG tablet Take 25 mg by mouth 2 (two) times daily.        . potassium chloride SA (K-DUR,KLOR-CON) 20 MEQ tablet Take 20 mEq by mouth daily.       . sertraline (ZOLOFT) 100 MG tablet Take 100 mg by mouth at bedtime.        Marland Kitchen azithromycin (ZITHROMAX) 500 MG tablet Last dose 03/28/12      . cefUROXime (CEFTIN) 500 MG tablet Last dose 03/28/12      . levalbuterol (XOPENEX) 0.63 MG/3ML nebulizer solution Take 3 mLs (0.63 mg total) by nebulization every 8 (eight) hours as needed for wheezing.  3 mL    .  menthol-cetylpyridinium (CEPACOL) 3 MG lozenge Take 1 lozenge (3 mg total) by mouth as needed.  100 tablet         Review of Systems  Constitutional: Negative for fever, chills, diaphoresis, activity change, appetite change, fatigue and unexpected weight change.  HENT: Positive for congestion and rhinorrhea. Negative for hearing loss, ear pain, nosebleeds, sore throat, facial swelling, sneezing, mouth sores, trouble swallowing, neck pain, neck stiffness, dental problem, voice change, postnasal drip, sinus pressure, tinnitus and ear discharge.   Eyes: Negative for photophobia, discharge, itching and visual disturbance.  Respiratory: Positive for cough. Negative for apnea, choking, chest tightness, shortness of breath, wheezing and stridor.   Cardiovascular: Positive for leg swelling. Negative for chest pain and palpitations.  Gastrointestinal: Negative for nausea, vomiting, abdominal pain, constipation, blood in stool and abdominal distention.  Genitourinary: Negative for dysuria, urgency, frequency, hematuria, flank pain, decreased urine volume and difficulty urinating.  Musculoskeletal: Negative for myalgias, back pain, joint swelling, arthralgias and gait problem.  Skin: Positive  for rash. Negative for color change and pallor.  Neurological: Negative for dizziness, tremors, seizures, syncope, speech difficulty, weakness, light-headedness, numbness and headaches.  Hematological: Negative for adenopathy. Bruises/bleeds easily.  Psychiatric/Behavioral: Positive for disturbed wake/sleep cycle. Negative for confusion and agitation. The patient is nervous/anxious.        Objective:   Physical Exam  Filed Vitals:   08/11/12 1447  BP: 110/60  Pulse: 73  Temp: 97.4 F (36.3 C)  TempSrc: Oral  Height: 5\' 8"  (1.727 m)  Weight: 105.915 kg (233 lb 8 oz)  SpO2: 98%    Gen: Pleasant, well-nourished, in no distress,  normal affect  ENT: No lesions,  mouth clear,  oropharynx clear, no postnasal drip  Neck: No JVD, no TMG, no carotid bruits  Lungs: No use of accessory muscles, no dullness to percussion, clear without rales or rhonchi  Cardiovascular: RRR, heart sounds normal, no murmur or gallops,2+peripheral edema  Abdomen: soft and NT, no HSM,  BS normal  Musculoskeletal:chronic venous stasis dermatitis in both LE  Neuro: alert, non focal  Skin: Warm, no lesions or rashes  CXR reviewed.  Cleda Daub:  FeV1 91%  DLCO 59%  TLC 94%  FeV1/FVC 83%  Echo:  Mod pulm HTN          Assessment & Plan:   Sleep apnea Hx of severe OSA, now intolerant to Cpap.  Pulm HTN on echo is likely d/t sleep apnea.  No other primary lung disease is discerned. This is similar Rec given by Byrum with his 2011 Consult  Plan Check ONO on RA, if still positive may benefit from nocturnal oxygen rx No other pulm Rx needed, no need for right heart cath or primary rx of pulm HTN  PULMONARY HYPERTENSION Secondary pulm HTN d/t OSA   Updated Medication List Outpatient Encounter Prescriptions as of 08/11/2012  Medication Sig Dispense Refill  . acetaminophen (TYLENOL) 325 MG tablet Take 2 tablets (650 mg total) by mouth every 6 (six) hours as needed (or Fever >/= 101).      Marland Kitchen  ALPRAZolam (XANAX) 0.25 MG tablet Take 0.25 mg by mouth daily as needed. For anxiety      . Cholecalciferol (VITAMIN D) 1000 UNITS capsule Take 1,000 Units by mouth daily.        Marland Kitchen diltiazem (CARDIZEM CD) 180 MG 24 hr capsule Take 1 capsule (180 mg total) by mouth daily.  30 capsule  0  . furosemide (LASIX) 80 MG tablet Take 40 mg by mouth  2 (two) times daily.       . metoprolol (LOPRESSOR) 50 MG tablet Take 25 mg by mouth 2 (two) times daily.        . potassium chloride SA (K-DUR,KLOR-CON) 20 MEQ tablet Take 20 mEq by mouth daily.       . sertraline (ZOLOFT) 100 MG tablet Take 100 mg by mouth at bedtime.        Marland Kitchen spironolactone (ALDACTONE) 25 MG tablet Take 25 mg by mouth daily.      Marland Kitchen DISCONTD: azithromycin (ZITHROMAX) 500 MG tablet Last dose 03/28/12      . DISCONTD: cefUROXime (CEFTIN) 500 MG tablet Last dose 03/28/12      . DISCONTD: levalbuterol (XOPENEX) 0.63 MG/3ML nebulizer solution Take 3 mLs (0.63 mg total) by nebulization every 8 (eight) hours as needed for wheezing.  3 mL    . DISCONTD: menthol-cetylpyridinium (CEPACOL) 3 MG lozenge Take 1 lozenge (3 mg total) by mouth as needed.  100 tablet

## 2012-08-11 NOTE — Patient Instructions (Addendum)
An overnight sleep oximetry will be obtained No medication changes We will call with result, you may need oxygen at night to sleep

## 2012-08-14 NOTE — Assessment & Plan Note (Signed)
Hx of severe OSA, now intolerant to Cpap.  Pulm HTN on echo is likely d/t sleep apnea.  No other primary lung disease is discerned. This is similar Rec given by Byrum with his 2011 Consult  Plan Check ONO on RA, if still positive may benefit from nocturnal oxygen rx No other pulm Rx needed, no need for right heart cath or primary rx of pulm HTN

## 2012-08-14 NOTE — Assessment & Plan Note (Signed)
Secondary pulm HTN d/t OSA

## 2012-08-22 ENCOUNTER — Telehealth: Payer: Self-pay | Admitting: Critical Care Medicine

## 2012-08-22 DIAGNOSIS — I2789 Other specified pulmonary heart diseases: Secondary | ICD-10-CM

## 2012-08-22 NOTE — Telephone Encounter (Signed)
Normal ONO. No desaturation  Call pt and tell her ONO was normal

## 2012-08-23 NOTE — Telephone Encounter (Signed)
Called, spoke with pt.  Informed her ONO was normal.  She verbalized understanding and voiced no further questions/concerns at this time.

## 2012-08-25 ENCOUNTER — Encounter: Payer: Self-pay | Admitting: Critical Care Medicine

## 2013-04-27 ENCOUNTER — Inpatient Hospital Stay (HOSPITAL_COMMUNITY)
Admission: EM | Admit: 2013-04-27 | Discharge: 2013-05-02 | DRG: 308 | Disposition: A | Discharge status: Home Health | Payer: Medicare Other | Attending: Internal Medicine | Admitting: Internal Medicine

## 2013-04-27 ENCOUNTER — Encounter (HOSPITAL_COMMUNITY): Payer: Self-pay | Admitting: Emergency Medicine

## 2013-04-27 ENCOUNTER — Emergency Department (HOSPITAL_COMMUNITY): Payer: Medicare Other

## 2013-04-27 DIAGNOSIS — A498 Other bacterial infections of unspecified site: Secondary | ICD-10-CM | POA: Diagnosis present

## 2013-04-27 DIAGNOSIS — I2789 Other specified pulmonary heart diseases: Secondary | ICD-10-CM

## 2013-04-27 DIAGNOSIS — E119 Type 2 diabetes mellitus without complications: Secondary | ICD-10-CM | POA: Diagnosis present

## 2013-04-27 DIAGNOSIS — R05 Cough: Secondary | ICD-10-CM

## 2013-04-27 DIAGNOSIS — I872 Venous insufficiency (chronic) (peripheral): Secondary | ICD-10-CM | POA: Diagnosis present

## 2013-04-27 DIAGNOSIS — M869 Osteomyelitis, unspecified: Secondary | ICD-10-CM

## 2013-04-27 DIAGNOSIS — E7800 Pure hypercholesterolemia, unspecified: Secondary | ICD-10-CM | POA: Diagnosis present

## 2013-04-27 DIAGNOSIS — I119 Hypertensive heart disease without heart failure: Secondary | ICD-10-CM

## 2013-04-27 DIAGNOSIS — I252 Old myocardial infarction: Secondary | ICD-10-CM

## 2013-04-27 DIAGNOSIS — G4733 Obstructive sleep apnea (adult) (pediatric): Secondary | ICD-10-CM | POA: Diagnosis present

## 2013-04-27 DIAGNOSIS — Z794 Long term (current) use of insulin: Secondary | ICD-10-CM

## 2013-04-27 DIAGNOSIS — I700 Atherosclerosis of aorta: Secondary | ICD-10-CM | POA: Diagnosis present

## 2013-04-27 DIAGNOSIS — S98139A Complete traumatic amputation of one unspecified lesser toe, initial encounter: Secondary | ICD-10-CM

## 2013-04-27 DIAGNOSIS — R0989 Other specified symptoms and signs involving the circulatory and respiratory systems: Secondary | ICD-10-CM

## 2013-04-27 DIAGNOSIS — J9819 Other pulmonary collapse: Secondary | ICD-10-CM | POA: Diagnosis present

## 2013-04-27 DIAGNOSIS — F411 Generalized anxiety disorder: Secondary | ICD-10-CM | POA: Diagnosis present

## 2013-04-27 DIAGNOSIS — C549 Malignant neoplasm of corpus uteri, unspecified: Secondary | ICD-10-CM

## 2013-04-27 DIAGNOSIS — I4891 Unspecified atrial fibrillation: Secondary | ICD-10-CM

## 2013-04-27 DIAGNOSIS — I079 Rheumatic tricuspid valve disease, unspecified: Secondary | ICD-10-CM | POA: Diagnosis present

## 2013-04-27 DIAGNOSIS — Z8541 Personal history of malignant neoplasm of cervix uteri: Secondary | ICD-10-CM

## 2013-04-27 DIAGNOSIS — R5381 Other malaise: Secondary | ICD-10-CM | POA: Diagnosis present

## 2013-04-27 DIAGNOSIS — Z87891 Personal history of nicotine dependence: Secondary | ICD-10-CM

## 2013-04-27 DIAGNOSIS — N390 Urinary tract infection, site not specified: Secondary | ICD-10-CM | POA: Diagnosis present

## 2013-04-27 DIAGNOSIS — I509 Heart failure, unspecified: Secondary | ICD-10-CM | POA: Diagnosis present

## 2013-04-27 DIAGNOSIS — I219 Acute myocardial infarction, unspecified: Secondary | ICD-10-CM

## 2013-04-27 DIAGNOSIS — E871 Hypo-osmolality and hyponatremia: Secondary | ICD-10-CM

## 2013-04-27 DIAGNOSIS — I110 Hypertensive heart disease with heart failure: Secondary | ICD-10-CM | POA: Diagnosis present

## 2013-04-27 DIAGNOSIS — R197 Diarrhea, unspecified: Secondary | ICD-10-CM | POA: Diagnosis present

## 2013-04-27 DIAGNOSIS — Z22322 Carrier or suspected carrier of Methicillin resistant Staphylococcus aureus: Secondary | ICD-10-CM

## 2013-04-27 DIAGNOSIS — G4730 Sleep apnea, unspecified: Secondary | ICD-10-CM

## 2013-04-27 DIAGNOSIS — I10 Essential (primary) hypertension: Secondary | ICD-10-CM

## 2013-04-27 DIAGNOSIS — I5033 Acute on chronic diastolic (congestive) heart failure: Secondary | ICD-10-CM | POA: Diagnosis present

## 2013-04-27 DIAGNOSIS — E8809 Other disorders of plasma-protein metabolism, not elsewhere classified: Secondary | ICD-10-CM | POA: Diagnosis present

## 2013-04-27 DIAGNOSIS — Z6841 Body Mass Index (BMI) 40.0 and over, adult: Secondary | ICD-10-CM

## 2013-04-27 HISTORY — DX: Depression, unspecified: F32.A

## 2013-04-27 HISTORY — DX: Heart failure, unspecified: I50.9

## 2013-04-27 HISTORY — DX: Major depressive disorder, single episode, unspecified: F32.9

## 2013-04-27 HISTORY — DX: Pneumonia, unspecified organism: J18.9

## 2013-04-27 LAB — COMPREHENSIVE METABOLIC PANEL
Albumin: 1.8 g/dL — ABNORMAL LOW (ref 3.5–5.2)
Alkaline Phosphatase: 85 U/L (ref 39–117)
BUN: 18 mg/dL (ref 6–23)
CO2: 25 mEq/L (ref 19–32)
Chloride: 97 mEq/L (ref 96–112)
Creatinine, Ser: 0.98 mg/dL (ref 0.50–1.10)
GFR calc Af Amer: 63 mL/min — ABNORMAL LOW (ref >90)
GFR calc non Af Amer: 55 mL/min — ABNORMAL LOW (ref >90)
Glucose, Bld: 200 mg/dL — ABNORMAL HIGH (ref 70–99)
Potassium: 3.4 mEq/L — ABNORMAL LOW (ref 3.5–5.1)
Total Bilirubin: 0.9 mg/dL (ref 0.3–1.2)

## 2013-04-27 LAB — CBC WITH DIFFERENTIAL/PLATELET
Basophils Absolute: 0 10*3/uL (ref 0.0–0.1)
Eosinophils Relative: 1 % (ref 0–5)
Lymphocytes Relative: 14 % (ref 12–46)
Lymphs Abs: 0.8 10*3/uL (ref 0.7–4.0)
MCV: 89.7 fL (ref 78.0–100.0)
Neutrophils Relative %: 72 % (ref 43–77)
Platelets: 156 10*3/uL (ref 150–400)
RBC: 4.57 MIL/uL (ref 3.87–5.11)
RDW: 14 % (ref 11.5–15.5)
WBC: 5.7 10*3/uL (ref 4.0–10.5)

## 2013-04-27 LAB — URINALYSIS, ROUTINE W REFLEX MICROSCOPIC
Bilirubin Urine: NEGATIVE
Glucose, UA: NEGATIVE mg/dL
Ketones, ur: NEGATIVE mg/dL
Nitrite: POSITIVE — AB
Specific Gravity, Urine: 1.007 (ref 1.005–1.030)
pH: 5 (ref 5.0–8.0)

## 2013-04-27 LAB — GLUCOSE, CAPILLARY

## 2013-04-27 LAB — POCT I-STAT TROPONIN I: Troponin i, poc: 0 ng/mL (ref 0.00–0.08)

## 2013-04-27 LAB — URINE MICROSCOPIC-ADD ON

## 2013-04-27 MED ORDER — METOPROLOL TARTRATE 25 MG PO TABS
25.0000 mg | ORAL_TABLET | Freq: Two times a day (BID) | ORAL | Status: DC
  Administered 2013-04-27 – 2013-05-02 (×5): 25 mg via ORAL
  Filled 2013-04-27 (×11): qty 1

## 2013-04-27 MED ORDER — SODIUM CHLORIDE 0.9 % IJ SOLN: 3.0000 mL | INTRAMUSCULAR | Status: DC | PRN

## 2013-04-27 MED ORDER — ALPRAZOLAM 0.25 MG PO TABS
0.2500 mg | ORAL_TABLET | Freq: Every evening | ORAL | Status: DC | PRN
  Administered 2013-04-27 – 2013-05-01 (×5): 0.25 mg via ORAL
  Filled 2013-04-27 (×5): qty 1

## 2013-04-27 MED ORDER — ENALAPRIL MALEATE 2.5 MG PO TABS
2.5000 mg | ORAL_TABLET | Freq: Two times a day (BID) | ORAL | Status: DC
  Administered 2013-04-28 – 2013-05-02 (×8): 2.5 mg via ORAL
  Filled 2013-04-27 (×10): qty 1

## 2013-04-27 MED ORDER — ASPIRIN EC 81 MG PO TBEC
81.0000 mg | DELAYED_RELEASE_TABLET | Freq: Every day | ORAL | Status: DC
  Administered 2013-04-28 – 2013-05-02 (×5): 81 mg via ORAL
  Filled 2013-04-27 (×5): qty 1

## 2013-04-27 MED ORDER — DEXTROSE 5 % IV SOLN
1.0000 g | Freq: Once | INTRAVENOUS | Status: AC
  Administered 2013-04-27: 1 g via INTRAVENOUS
  Filled 2013-04-27: qty 10

## 2013-04-27 MED ORDER — AZITHROMYCIN 250 MG PO TABS: 250.0000 mg | ORAL_TABLET | Freq: Every day | ORAL | Status: DC

## 2013-04-27 MED ORDER — FUROSEMIDE 40 MG PO TABS
40.0000 mg | ORAL_TABLET | Freq: Two times a day (BID) | ORAL | Status: DC
  Administered 2013-04-28: 40 mg via ORAL
  Filled 2013-04-27 (×3): qty 1

## 2013-04-27 MED ORDER — ACETAMINOPHEN 325 MG PO TABS
650.0000 mg | ORAL_TABLET | ORAL | Status: DC | PRN
  Administered 2013-04-28 – 2013-04-30 (×3): 650 mg via ORAL
  Filled 2013-04-27 (×4): qty 2

## 2013-04-27 MED ORDER — SODIUM CHLORIDE 0.9 % IJ SOLN
3.0000 mL | Freq: Two times a day (BID) | INTRAMUSCULAR | Status: DC
  Administered 2013-04-27: 3 mL via INTRAVENOUS

## 2013-04-27 MED ORDER — POTASSIUM CHLORIDE CRYS ER 20 MEQ PO TBCR
20.0000 meq | EXTENDED_RELEASE_TABLET | Freq: Two times a day (BID) | ORAL | Status: DC
  Administered 2013-04-27 – 2013-05-02 (×10): 20 meq via ORAL
  Filled 2013-04-27 (×13): qty 1

## 2013-04-27 MED ORDER — FUROSEMIDE 40 MG PO TABS: 40.0000 mg | ORAL_TABLET | Freq: Two times a day (BID) | ORAL | Status: DC

## 2013-04-27 MED ORDER — ONDANSETRON HCL 4 MG/2ML IJ SOLN: 4.0000 mg | Freq: Four times a day (QID) | INTRAMUSCULAR | Status: DC | PRN

## 2013-04-27 MED ORDER — AZITHROMYCIN 500 MG PO TABS
500.0000 mg | ORAL_TABLET | Freq: Every day | ORAL | Status: AC
  Administered 2013-04-28: 500 mg via ORAL
  Filled 2013-04-27: qty 1

## 2013-04-27 MED ORDER — SPIRONOLACTONE 25 MG PO TABS
25.0000 mg | ORAL_TABLET | Freq: Every day | ORAL | Status: DC
  Administered 2013-04-28 – 2013-05-02 (×5): 25 mg via ORAL
  Filled 2013-04-27 (×5): qty 1

## 2013-04-27 MED ORDER — SODIUM CHLORIDE 0.9 % IV SOLN: 250.0000 mL | INTRAVENOUS | Status: DC | PRN

## 2013-04-27 MED ORDER — DEXTROSE 5 % IV SOLN
1.0000 g | INTRAVENOUS | Status: DC
  Administered 2013-04-28 – 2013-04-29 (×2): 1 g via INTRAVENOUS
  Filled 2013-04-27 (×2): qty 10

## 2013-04-27 MED ORDER — ENOXAPARIN SODIUM 40 MG/0.4ML ~~LOC~~ SOLN
40.0000 mg | SUBCUTANEOUS | Status: DC
  Administered 2013-04-27 – 2013-04-30 (×4): 40 mg via SUBCUTANEOUS
  Filled 2013-04-27 (×6): qty 0.4

## 2013-04-27 MED ORDER — INSULIN ASPART 100 UNIT/ML ~~LOC~~ SOLN
0.0000 [IU] | Freq: Three times a day (TID) | SUBCUTANEOUS | Status: DC
  Administered 2013-04-28 (×2): 3 [IU] via SUBCUTANEOUS
  Administered 2013-04-29 – 2013-04-30 (×6): 2 [IU] via SUBCUTANEOUS
  Administered 2013-05-01 – 2013-05-02 (×2): 3 [IU] via SUBCUTANEOUS

## 2013-04-27 MED ORDER — FUROSEMIDE 10 MG/ML IJ SOLN
80.0000 mg | Freq: Once | INTRAMUSCULAR | Status: AC
  Administered 2013-04-27: 80 mg via INTRAVENOUS
  Filled 2013-04-27: qty 8

## 2013-04-27 MED ORDER — INSULIN ASPART 100 UNIT/ML ~~LOC~~ SOLN: 0.0000 [IU] | Freq: Every day | SUBCUTANEOUS | Status: DC

## 2013-04-27 MED ORDER — DILTIAZEM HCL ER COATED BEADS 180 MG PO CP24
180.0000 mg | ORAL_CAPSULE | Freq: Every day | ORAL | Status: DC
  Administered 2013-04-28 – 2013-05-02 (×4): 180 mg via ORAL
  Filled 2013-04-27 (×5): qty 1

## 2013-04-27 MED ORDER — SERTRALINE HCL 100 MG PO TABS
100.0000 mg | ORAL_TABLET | Freq: Every day | ORAL | Status: DC
  Administered 2013-04-28 – 2013-05-01 (×4): 100 mg via ORAL
  Filled 2013-04-27 (×6): qty 1

## 2013-04-27 NOTE — ED Notes (Signed)
Per EMS - pt coming from home. Pt c/o diarrhea X 3 days, not runny stools, but softer than normal. Pt also sts she has some weeping in right leg. No vomiting or pain. Pt takes 80mg  of lasix daily. Pt also thinks she has a UTI, no odor but having some burning with urination. Pt in nad, skin warm and dry, resp e/u. BP 111/66 HR 78, CBG 198. Lung sounds clear.

## 2013-04-27 NOTE — ED Notes (Signed)
Spoke with Dr. Adela Glimpse, she is putting the admission orders in now.
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
## 2013-04-27 NOTE — H&P (Signed)
PCP:  Allean Found, MD  Cardiology: Dr. Mayford Knife  Chief Complaint:  diarrhea  HPI: Tara Huff is a 79 y.o. female   has a past medical history of Hypertension; Diabetes mellitus; Atrial fibrillation; MI (myocardial infarction); Anxiety; Hypercholesterolemia; Shoulder pain; Sleep apnea; Type 2 diabetes mellitus; OSA (obstructive sleep apnea); Hypertensive heart disease; Morbid obesity; Pyogenic granuloma; and Cancer.   Presented with  Hx of diarrhea for  3 days that is now getting better. She has been retaining fluids in her legs for the past few weeks.  She started to have weeping areas from her legs where she got scratched by the cats. Denies any shortness of breath or chest pain. Subjective fever for the past few days. Denies any sick contacts. Patient felt so fatigued she could not get to the office to be evaluated so her PCP told her to call 911. She has had decreased PO intake for the past few days. She have had spasms in her hands.  Patient was noted to be in A.fib with RVR and slightly hypotensive but also with evidence of peripheral edema. ER MD gave her Lasix 80 IV and hospitalist was called for an admission.   Review of Systems:    Pertinent positives include: Fevers, diarrhea,  Fatigue, Bilateral lower extremity swelling   Constitutional:  No weight loss, night sweats,  chills, weight loss  HEENT:  No headaches, Difficulty swallowing,Tooth/dental problems,Sore throat,  No sneezing, itching, ear ache, nasal congestion, post nasal drip,  Cardio-vascular:  No chest pain, Orthopnea, PND, anasarca, dizziness, palpitations.no  GI:  No heartburn, indigestion, abdominal pain, nausea, vomiting, change in bowel habits, loss of appetite, melena, blood in stool, hematemesis Resp:  no shortness of breath at rest. No dyspnea on exertion, No excess mucus, no productive cough, No non-productive cough, No coughing up of blood.No change in color of mucus.No wheezing. Skin:  no rash  or lesions. No jaundice GU:  no dysuria, change in color of urine, no urgency or frequency. No straining to urinate.  No flank pain.  Musculoskeletal:  No joint pain or no joint swelling. No decreased range of motion. No back pain.  Psych:  No change in mood or affect. No depression or anxiety. No memory loss.  Neuro: no localizing neurological complaints, no tingling, no weakness, no double vision, no gait abnormality, no slurred speech, no confusion  Otherwise ROS are negative except for above, 10 systems were reviewed  Past Medical History: Past Medical History  Diagnosis Date  . Hypertension   . Diabetes mellitus   . Atrial fibrillation   . MI (myocardial infarction)   . Anxiety   . Hypercholesterolemia   . Shoulder pain   . Sleep apnea     intolerant to CPAP  . Type 2 diabetes mellitus   . OSA (obstructive sleep apnea)   . Hypertensive heart disease   . Morbid obesity   . Pyogenic granuloma   . Cancer     cervical   Past Surgical History  Procedure Laterality Date  . Knee surgery      at age 78  . Cataract surgery      both eyes  . Abdominal hysterectomy    . Toe amputation       Medications: Prior to Admission medications   Medication Sig Start Date End Date Taking? Authorizing Provider  acetaminophen (TYLENOL) 500 MG tablet Take 1,000 mg by mouth 2 (two) times daily.   Yes Historical Provider, MD  ALPRAZolam Prudy Feeler) 0.25 MG tablet  Take 0.25 mg by mouth at bedtime as needed for sleep. For anxiety   Yes Historical Provider, MD  Cholecalciferol (VITAMIN D) 1000 UNITS capsule Take 1,000 Units by mouth daily.     Yes Historical Provider, MD  diltiazem (CARDIZEM CD) 180 MG 24 hr capsule Take 1 capsule (180 mg total) by mouth daily. 05/11/12  Yes Katie Mahoney-Tesoriero, MD  furosemide (LASIX) 80 MG tablet Take 40 mg by mouth 2 (two) times daily.    Yes Historical Provider, MD  metoprolol (LOPRESSOR) 50 MG tablet Take 25 mg by mouth 2 (two) times daily.     Yes  Historical Provider, MD  potassium chloride SA (K-DUR,KLOR-CON) 20 MEQ tablet Take 20 mEq by mouth 2 (two) times daily.    Yes Historical Provider, MD  sertraline (ZOLOFT) 100 MG tablet Take 100 mg by mouth at bedtime.     Yes Historical Provider, MD  spironolactone (ALDACTONE) 25 MG tablet Take 25 mg by mouth daily.   Yes Historical Provider, MD    Allergies:  No Known Allergies  Social History:  Ambulatory wheel chair and walker Lives at  Home with husband   reports that she quit smoking about 20 years ago. Her smoking use included Cigarettes. She has a 20 pack-year smoking history. She has never used smokeless tobacco. She reports that she does not drink alcohol or use illicit drugs.   Family History: family history includes Cancer in her mother and Emphysema in her father.    Physical Exam: Patient Vitals for the past 24 hrs:  BP Temp Temp src Pulse Resp SpO2  04/27/13 1900 102/58 mmHg - - 108 25 98 %  04/27/13 1845 - - - 111 26 96 %  04/27/13 1830 - - - 109 25 95 %  04/27/13 1815 - - - 116 21 98 %  04/27/13 1800 - - - 128 21 97 %  04/27/13 1745 - - - 122 28 96 %  04/27/13 1740 102/64 mmHg - - 108 24 94 %  04/27/13 1730 - - - 123 27 96 %  04/27/13 1700 - - - 127 21 96 %  04/27/13 1645 120/92 mmHg - - 131 30 96 %  04/27/13 1643 - - - 120 26 96 %  04/27/13 1637 99/63 mmHg 99.3 F (37.4 C) Oral - - 96 %    1. General:  in No Acute distress 2. Psychological: Alert and   Oriented 3. Head/ENT:   Moist  Mucous Membranes                          Head Non traumatic, neck supple                          Normal   Dentition 4. SKIN:   decreased Skin turgor,  Skin clean Dry weeping areas of breakdown on lower extremities 5. Heart: irregular rate and rhythm no Murmur, Rub or gallop 6. Lungs: no wheezes occasional crackles   7. Abdomen: Soft, non-tender, Non distended, obese, areas of lymphedema of the abdomen 8. Lower extremities: no clubbing, cyanosis, severe peripheral edema up  to the thighs with evidence of chronic venostasis disease 9. Neurologically Grossly intact, moving all 4 extremities equally 10. MSK: Normal range of motion  body mass index is unknown because there is no weight on file.   Labs on Admission:   Recent Labs  04/27/13 1702  NA 131*  K 3.4*  CL 97  CO2 25  GLUCOSE 200*  BUN 18  CREATININE 0.98  CALCIUM 8.0*    Recent Labs  04/27/13 1702  AST 36  ALT 9  ALKPHOS 85  BILITOT 0.9  PROT 5.4*  ALBUMIN 1.8*   No results found for this basename: LIPASE, AMYLASE,  in the last 72 hours  Recent Labs  04/27/13 1702  WBC 5.7  NEUTROABS 4.1  HGB 13.7  HCT 41.0  MCV 89.7  PLT 156   No results found for this basename: CKTOTAL, CKMB, CKMBINDEX, TROPONINI,  in the last 72 hours No results found for this basename: TSH, T4TOTAL, FREET3, T3FREE, THYROIDAB,  in the last 72 hours No results found for this basename: VITAMINB12, FOLATE, FERRITIN, TIBC, IRON, RETICCTPCT,  in the last 72 hours Lab Results  Component Value Date   HGBA1C 6.9* 03/19/2012    The CrCl is unknown because both a height and weight (above a minimum accepted value) are required for this calculation. ABG    Component Value Date/Time   PHART 7.410* 09/02/2011 1225   HCO3 19.5* 09/02/2011 1225   TCO2 23 05/11/2012 1507   ACIDBASEDEF 4.3* 09/02/2011 1225   O2SAT 95.5 09/02/2011 1225     Lab Results  Component Value Date   DDIMER  Value: 3.91        AT THE INHOUSE ESTABLISHED CUTOFF VALUE OF 0.48 ug/mL FEU, THIS ASSAY HAS BEEN DOCUMENTED IN THE LITERATURE TO HAVE A SENSITIVITY AND NEGATIVE PREDICTIVE VALUE OF AT LEAST 98 TO 99%.  THE TEST RESULT SHOULD BE CORRELATED WITH AN ASSESSMENT OF THE CLINICAL PROBABILITY OF DVT / VTE.* 05/04/2010     Other results:  I have pearsonaly reviewed this: ECG REPORT  Rate: 141  Rhythm: a. Fib with RVR ST&T Change: no ischemic changes  UA evidence of UTI BNP 2160  Cultures:    Component Value Date/Time   SDES SPUTUM  03/20/2012 2052   SPECREQUEST NONE 03/20/2012 2052   CULT NORMAL OROPHARYNGEAL FLORA 03/20/2012 2050   REPTSTATUS 03/20/2012 FINAL 03/20/2012 2052       Radiological Exams on Admission: Dg Chest Port 1 View  04/27/2013   *RADIOLOGY REPORT*  Clinical Data: Swelling in legs, edema, cough, COPD  PORTABLE CHEST - 1 VIEW  Comparison: The multiple prior chest radiographs, most recent, 03/19/2012.  CT chest 05/27/2011  Findings: Stable cardiomegaly.  There is a prominent right paratracheal soft tissue contour, which which is likely accentuated by patient rotation to the right.  This appears new compared to prior portable chest radiograph of 03/19/2012.  There is diffuse pulmonary vascular congestion and interstitial prominence.  No focal consolidation, visible pleural effusion, or pneumothorax. Bones unremarkable.  IMPRESSION:  1.  Cardiomegaly with pulmonary vascular congestion and probable interstitial pulmonary edema. 2.  Prominent right paratracheal soft tissue contour, likely accentuated by patient rotation to the right.  Benign prominence related to ectatic vessels/vascular congestion is a consideration. Lymphadenopathy cannot be excluded.  Consider initial evaluation with a two-view chest radiograph.   Original Report Authenticated By: Britta Mccreedy, M.D.    Chart has been reviewed  Assessment/Plan  79 yo F w hx of a.fib and tricuspid regurgitation with preserved EF per echo 1 year ago here with debility , diarrhea severe anasarca and evidence of pulmonary vascular congestion and hypoalbuminemia.   Present on Admission:  Anasarca - multifactorial, patient has history of tricuspid regurgitation, pulmonary hypertension. She also has venous stasis and hypoalbuminemia all this is probably contributing to some degree. Her  fluid status is difficult to determine she may have some intravascular depletion in the setting of recent diarrhea and poor by mouth intake but at the same time has soft tissue edema which  is widespread as well as evidence of pulmonary vascular congestion. Patient received IV Lasix in emergency department will watch her blood pressure urine output and creatinine. Was probably benefit from cardiology consult in a.m. Will order echo gram and cycle cardiac enzymes . Atrial fibrillation with RVR - at this point heart rate is in 120s. Patient's name and asymptomatic will watch and step down to restart her home medications if this not improve will need Cardizem drip. Cycle cardiac enzymes. . Urinary tract infection - treat with Rocephin . Type 2 diabetes mellitus - sliding scale no insulin at this point . Hypertension - patient have had some soft blood pressure readings an emergency department will monitor carefully. Will add holding parameters . Diarrhea - etiology unclear will obtain stool cultures and stool lactoferrin,  c.difficile PCR . Hyponatremia - likely multifactorial in the setting of CHF and possible fluid overload.  we'll obtain urine electrolytes . Debility - PT/ OT eval . Hypoalbuminemia - Will check prealbumin patient reports poor by mouth intake nutrition consult also check other liver function such as INR, LFTs are within normal limits no history of liver disease. No proteinuria Venous stasis  Disease - with  skin breakdown although there is no definitive cellulitis she have had exposure to cat scratches will cover with azithromycin  Prophylaxis: Lovenox, Protonix  CODE STATUS: DNR/DNI  Per patient  Other plan as per orders.  I have spent a total of 55 min on this admission  Devondre Guzzetta 04/27/2013, 8:06 PM

## 2013-04-27 NOTE — ED Provider Notes (Signed)
History     CSN: [id number]  Arrival date & time 04/27/13  1628   First MD Initiated Contact with Patient 04/27/13 1629      Chief Complaint  Patient presents with  . Diarrhea    (Consider location/radiation/quality/duration/timing/severity/associated sxs/prior treatment) HPI Comments: Patient presents to the ER from home by EMS. Patient reports that she has been having diarrhea for 3 days. She has not had nausea, vomiting or fever. Denies abdominal pain. She has a history of chronic lower extremity edema, thinks that she has been retaining fluid more recently. She is now expressing chest pain or shortness of breath. Patient reports that she has had some weeping of clear fluid from her right leg.  Patient also complaining of urinary frequency and dysuria for several days. She says that she has had similar symptoms with urinary tract infection in the past.  Patient is a 79 y.o. female presenting with diarrhea.  Diarrhea Associated symptoms: no fever     Past Medical History  Diagnosis Date  . Hypertension   . Diabetes mellitus   . Atrial fibrillation   . MI (myocardial infarction)   . Anxiety   . Hypercholesterolemia   . Shoulder pain   . Sleep apnea     intolerant to CPAP  . Type 2 diabetes mellitus   . OSA (obstructive sleep apnea)   . Hypertensive heart disease   . Morbid obesity   . Pyogenic granuloma   . Cancer     cervical    Past Surgical History  Procedure Laterality Date  . Knee surgery      at age 23  . Cataract surgery      both eyes  . Abdominal hysterectomy    . Toe amputation      Family History  Problem Relation Age of Onset  . Emphysema Father   . Cancer Mother     intestinal    History  Substance Use Topics  . Smoking status: Former Smoker -- 1.00 packs/day for 20 years    Types: Cigarettes    Quit date: 11/09/1992  . Smokeless tobacco: Never Used  . Alcohol Use: No    OB History   Grav Para Term Preterm Abortions TAB SAB Ect Mult  Living                  Review of Systems  Constitutional: Negative for fever.  Respiratory: Negative for shortness of breath.   Cardiovascular: Positive for leg swelling. Negative for chest pain.  Gastrointestinal: Positive for diarrhea.  Skin: Positive for wound.  All other systems reviewed and are negative.    Allergies  Review of patient's allergies indicates no known allergies.  Home Medications   Current Outpatient Rx  Name  Route  Sig  Dispense  Refill  . ALPRAZolam (XANAX) 0.25 MG tablet   Oral   Take 0.25 mg by mouth daily as needed. For anxiety         . Cholecalciferol (VITAMIN D) 1000 UNITS capsule   Oral   Take 1,000 Units by mouth daily.           Marland Kitchen diltiazem (CARDIZEM CD) 180 MG 24 hr capsule   Oral   Take 1 capsule (180 mg total) by mouth daily.   30 capsule   0   . furosemide (LASIX) 80 MG tablet   Oral   Take 40 mg by mouth 2 (two) times daily.          . metoprolol (  LOPRESSOR) 50 MG tablet   Oral   Take 25 mg by mouth 2 (two) times daily.           . potassium chloride SA (K-DUR,KLOR-CON) 20 MEQ tablet   Oral   Take 20 mEq by mouth daily.          . sertraline (ZOLOFT) 100 MG tablet   Oral   Take 100 mg by mouth at bedtime.           Marland Kitchen spironolactone (ALDACTONE) 25 MG tablet   Oral   Take 25 mg by mouth daily.           There were no vitals taken for this visit.  Physical Exam  Constitutional: She is oriented to person, place, and time. She appears well-developed and well-nourished. No distress.  HENT:  Head: Normocephalic and atraumatic.  Right Ear: Hearing normal.  Left Ear: Hearing normal.  Nose: Nose normal.  Mouth/Throat: Oropharynx is clear and moist and mucous membranes are normal.  Eyes: Conjunctivae and EOM are normal. Pupils are equal, round, and reactive to light.  Neck: Normal range of motion. Neck supple.  Cardiovascular: S1 normal and S2 normal.  An irregularly irregular rhythm present. Tachycardia  present.  Exam reveals no gallop and no friction rub.   No murmur heard. Pulmonary/Chest: Effort normal and breath sounds normal. No respiratory distress. She exhibits no tenderness.  Abdominal: Soft. Normal appearance and bowel sounds are normal. There is no hepatosplenomegaly. There is no tenderness. There is no rebound, no guarding, no tenderness at McBurney's point and negative Murphy's sign. No hernia.  Musculoskeletal: Normal range of motion. She exhibits edema.  Neurological: She is alert and oriented to person, place, and time. She has normal strength. No cranial nerve deficit or sensory deficit. Coordination normal. GCS eye subscore is 4. GCS verbal subscore is 5. GCS motor subscore is 6.  Skin: Skin is warm, dry and intact. No rash noted. No cyanosis.  Significant findings of chronic venous stasis bilateral lower extremities, left greater than right.  There are 2 cm linear superficial abrasions on the anterior right lower leg that are weeping clear fluid  Psychiatric: She has a normal mood and affect. Her speech is normal and behavior is normal. Thought content normal.    ED Course  Procedures (including critical care time)  EKG:  Date: 04/27/2013  Rate: 141  Rhythm: atrial fibrillation  QRS Axis: right  Intervals: normal  ST/T Wave abnormalities: nonspecific ST/T changes  Conduction Disutrbances:inc RBBB  Narrative Interpretation:   Old EKG Reviewed: unchanged    Labs Reviewed  URINALYSIS, ROUTINE W REFLEX MICROSCOPIC - Abnormal; Notable for the following:    APPearance CLOUDY (*)    Hgb urine dipstick MODERATE (*)    Nitrite POSITIVE (*)    Leukocytes, UA LARGE (*)    All other components within normal limits  COMPREHENSIVE METABOLIC PANEL - Abnormal; Notable for the following:    Sodium 131 (*)    Potassium 3.4 (*)    Glucose, Bld 200 (*)    Calcium 8.0 (*)    Total Protein 5.4 (*)    Albumin 1.8 (*)    GFR calc non Af Amer 55 (*)    GFR calc Af Amer 63 (*)     All other components within normal limits  PRO B NATRIURETIC PEPTIDE - Abnormal; Notable for the following:    Pro B Natriuretic peptide (BNP) 2160.0 (*)    All other components within normal limits  URINE MICROSCOPIC-ADD ON - Abnormal; Notable for the following:    Bacteria, UA MANY (*)    All other components within normal limits  URINE CULTURE  CBC WITH DIFFERENTIAL  POCT I-STAT TROPONIN I   Dg Chest Port 1 View  04/27/2013   *RADIOLOGY REPORT*  Clinical Data: Swelling in legs, edema, cough, COPD  PORTABLE CHEST - 1 VIEW  Comparison: The multiple prior chest radiographs, most recent, 03/19/2012.  CT chest 05/27/2011  Findings: Stable cardiomegaly.  There is a prominent right paratracheal soft tissue contour, which which is likely accentuated by patient rotation to the right.  This appears new compared to prior portable chest radiograph of 03/19/2012.  There is diffuse pulmonary vascular congestion and interstitial prominence.  No focal consolidation, visible pleural effusion, or pneumothorax. Bones unremarkable.  IMPRESSION:  1.  Cardiomegaly with pulmonary vascular congestion and probable interstitial pulmonary edema. 2.  Prominent right paratracheal soft tissue contour, likely accentuated by patient rotation to the right.  Benign prominence related to ectatic vessels/vascular congestion is a consideration. Lymphadenopathy cannot be excluded.  Consider initial evaluation with a two-view chest radiograph.   Original Report Authenticated By: Britta Mccreedy, M.D.     Diagnosis: 1. Congestive heart failure 2. Urinary tract infection 3. Atrial fibrillation with rapid ventricular response 4. Diarrhea    MDM  Patient initially called EMS for evaluation of diarrhea for 3 days. Patient has no associated vomiting and there is no abdominal pain. Abdominal exam is benign. She also is complaining of increased swelling of her legs up to the lower abdominal area. She reports that she has noticed weeping of  fluid from her right leg. There is a linear abrasion where the fluid is leaking from, no associated erythema or signs of abscess/cellulitis. Patient does have an elevated BNP with a chest x-ray which shows cardiomegaly and increased pulmonary vasculature including interstitial edema. She will need treatment for decompensated heart failure. No signs of acute coronary syndrome. The patient did have accelerated ventricular rhythm associated with her chronic atrial fibrillation on arrival. She was as high as 140 after being moved from the EMS stretcher to the ED bed. When lying and not moving in the bed, however, heart rate is around 100-110. Blood pressure was slightly soft, low 100s and therefore did not administer any additional rate control for her.  Patient does endorse urinary symptoms and her urinalysis is grossly abnormal. Objective neck with Rocephin initiated and culture will need to be followed.  Patient will need hospitalization for treatment of multiple problems as outlined above. Primary care doctor is Merri Brunette at [facility]. [facility] consulted for admission.       Gilda Crease, MD 04/27/13 [phone]

## 2013-04-27 NOTE — ED Notes (Signed)
Attempted report X1

## 2013-04-28 DIAGNOSIS — J811 Chronic pulmonary edema: Secondary | ICD-10-CM

## 2013-04-28 DIAGNOSIS — R197 Diarrhea, unspecified: Secondary | ICD-10-CM

## 2013-04-28 LAB — PREALBUMIN: Prealbumin: 6.7 mg/dL — ABNORMAL LOW (ref 17.0–34.0)

## 2013-04-28 LAB — CBC WITH DIFFERENTIAL/PLATELET
Basophils Absolute: 0 10*3/uL (ref 0.0–0.1)
Lymphocytes Relative: 16 % (ref 12–46)
Lymphs Abs: 0.8 10*3/uL (ref 0.7–4.0)
MCV: 88.7 fL (ref 78.0–100.0)
Neutro Abs: 3.3 10*3/uL (ref 1.7–7.7)
Neutrophils Relative %: 69 % (ref 43–77)
Platelets: 139 10*3/uL — ABNORMAL LOW (ref 150–400)
RBC: 4.25 MIL/uL (ref 3.87–5.11)
RDW: 14 % (ref 11.5–15.5)
WBC: 4.7 10*3/uL (ref 4.0–10.5)

## 2013-04-28 LAB — HEMOGLOBIN A1C: Hgb A1c MFr Bld: 10.6 % — ABNORMAL HIGH (ref <5.7)

## 2013-04-28 LAB — CREATININE, SERUM
GFR calc Af Amer: 63 mL/min — ABNORMAL LOW (ref >90)
GFR calc non Af Amer: 55 mL/min — ABNORMAL LOW (ref >90)

## 2013-04-28 LAB — MAGNESIUM: Magnesium: 1.6 mg/dL (ref 1.5–2.5)

## 2013-04-28 LAB — CBC
HCT: 39.3 % (ref 36.0–46.0)
Hemoglobin: 13.4 g/dL (ref 12.0–15.0)
MCV: 88.9 fL (ref 78.0–100.0)
RBC: 4.42 MIL/uL (ref 3.87–5.11)
WBC: 5.8 10*3/uL (ref 4.0–10.5)

## 2013-04-28 LAB — TSH: TSH: 1.942 u[IU]/mL (ref 0.350–4.500)

## 2013-04-28 LAB — BASIC METABOLIC PANEL
BUN: 17 mg/dL (ref 6–23)
Chloride: 100 mEq/L (ref 96–112)
Creatinine, Ser: 1.01 mg/dL (ref 0.50–1.10)
GFR calc Af Amer: 61 mL/min — ABNORMAL LOW (ref >90)
Glucose, Bld: 183 mg/dL — ABNORMAL HIGH (ref 70–99)

## 2013-04-28 LAB — TROPONIN I: Troponin I: <0.3 ng/mL (ref <0.30)

## 2013-04-28 LAB — MRSA PCR SCREENING: MRSA by PCR: POSITIVE — AB

## 2013-04-28 LAB — SODIUM, URINE, RANDOM: Sodium, Ur: 44 mEq/L

## 2013-04-28 LAB — GLUCOSE, CAPILLARY: Glucose-Capillary: 170 mg/dL — ABNORMAL HIGH (ref 70–99)

## 2013-04-28 MED ORDER — CHLORHEXIDINE GLUCONATE CLOTH 2 % EX PADS
6.0000 | MEDICATED_PAD | Freq: Every day | CUTANEOUS | Status: AC
  Administered 2013-04-27 – 2013-05-02 (×5): 6 via TOPICAL

## 2013-04-28 MED ORDER — FUROSEMIDE 10 MG/ML IJ SOLN
40.0000 mg | Freq: Three times a day (TID) | INTRAMUSCULAR | Status: DC
  Administered 2013-04-28 – 2013-05-02 (×12): 40 mg via INTRAVENOUS
  Filled 2013-04-28 (×17): qty 4

## 2013-04-28 MED ORDER — MUPIROCIN 2 % EX OINT
1.0000 "application " | TOPICAL_OINTMENT | Freq: Two times a day (BID) | CUTANEOUS | Status: DC
  Administered 2013-04-28 – 2013-05-02 (×9): 1 via NASAL
  Filled 2013-04-28: qty 22

## 2013-04-28 MED ORDER — INSULIN GLARGINE 100 UNIT/ML ~~LOC~~ SOLN
15.0000 [IU] | Freq: Every day | SUBCUTANEOUS | Status: DC
  Administered 2013-04-28 – 2013-05-01 (×4): 15 [IU] via SUBCUTANEOUS
  Filled 2013-04-28 (×5): qty 0.15

## 2013-04-28 MED ORDER — GLUCERNA SHAKE PO LIQD
237.0000 mL | Freq: Two times a day (BID) | ORAL | Status: DC
  Administered 2013-04-28 – 2013-05-02 (×6): 237 mL via ORAL
  Filled 2013-04-28 (×2): qty 237

## 2013-04-28 MED ORDER — ALPRAZOLAM 0.25 MG PO TABS
0.2500 mg | ORAL_TABLET | Freq: Once | ORAL | Status: AC
  Administered 2013-04-28: 0.25 mg via ORAL
  Filled 2013-04-28: qty 1
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
## 2013-04-28 NOTE — Care Management Note (Addendum)
  Page 2 of 2   05/01/2013     3:36:50 PM   CARE MANAGEMENT NOTE 05/01/2013  Patient:  Tara Huff, Tara Huff   Account Number:  [id number]  Date Initiated:  04/28/2013  Documentation initiated by:  GRAVES-BIGELOW,Joe Tanney  Subjective/Objective Assessment:   Pt admitted for generalized weakness secondary to 3 days of diarrhea. Per MD notes pt was noted to be in A. fib with RVR and congestive heart failure.     Action/Plan:   CSW spoke to pt and pt is refusing SNF at this time and wants HH services via [facility]. She has used them in the past. Pt will benefit from [facility] services RN for disease management and PT/OT. Will make referral once orders received.   Anticipated DC Date:  05/01/2013   Anticipated DC Plan:  HOME W HOME HEALTH SERVICES      DC Planning Services  CM consult      [facility] Choice  HOME HEALTH   Choice offered to / List presented to:          [facility] arranged  HH-1 RN  HH-10 DISEASE MANAGEMENT  HH-2 PT  HH-3 OT      [facility] agency  [facility]   Status of service:  Completed, signed off Medicare Important Message given?   (If response is "NO", the following Medicare IM given date fields will be blank) Date Medicare IM given:   Date Additional Medicare IM given:    Discharge Disposition:  HOME W HOME HEALTH SERVICES  Per UR Regulation:  Reviewed for med. necessity/level of care/duration of stay  If discussed at Long Length of Stay Meetings, dates discussed:    Comments:  05-01-13 [facility] referral made. Will see if will be a candidate for services.  Referral made for [facility] services with [place]. Will call MD for orders. Thanks  Tomi Bamberger, RN,BSN [phone]

## 2013-04-28 NOTE — Progress Notes (Addendum)
[facility] Progress Note Tara Huff TEAM 1 - Stepdown/ICU TEAM   Tara Huff UXL:[id number] DOB: Nov 17, 1933 DOA: 04/27/2013 PCP: Allean Found, MD  Brief narrative: This is a 79 year old female with a history of hypertension diabetes atrial fibrillation sleep apnea diabetes mellitus morbid obesity. She presents to the hospital with generalized weakness secondary to 3 days of diarrhea. In addition she states that she is retaining fluid in her legs and is having weeping from areas on her legs where she was scratched by her cat. She was noted to be in A. fib with RVR and congestive heart failure.  Assessment/Plan: Principal Problem:   Atrial fibrillation with RVR -Rate now better controlled on oral Cardizem -Patient is not on anticoagulation  Active Problems:  Pulmonary edema/moderate tricuspid regurgitation/severely dilated left atrium -I suspect she has diastolic dysfunction and will be repeating an echo -Continue Lasix and monitor I and O.'s carefully    Urinary tract infection Continue current antibiotics and followup on culture    Hypertension BP slightly low-continue to monitor    Type 2 diabetes mellitus -Add Lantus at 10 units each bedtime and start a moderate sliding scale  -A1c is 10.6    Diarrhea -Last episode was yesterday-continue Current diet and follow    Hyponatremia -Improved today-continue to follow    Hypoalbuminemia -likely the cause of her anasarca in addition to heart failure    Code Status: DNR Family Communication: none Disposition Plan: transfer to med/surg  Consultants: none  Procedures: none  Antibiotics: Rocephin and Zithromax 6/19- will DC Zithromax today as there is no sign of pneumonia or bronchitis  DVT prophylaxis: Lovenox  HPI/Subjective: Feeling much stronger than yesterday. Last episode of diarrhea was yesterday morning. No abdominal pain or nausea. I have discussed her diabetes with her and the fact that  her A1c is quite elevated. She states that at one point she was in a nursing facility and it was noted that her sugars were low and therefore she decided to discontinue her insulin on her own. She is quite hesitant to go back on insulin but I've explained to her that it is absolutely necessary and she is agreeable after much convincing.   Objective: Blood pressure 109/70, pulse 89, temperature 98.5 F (36.9 C), temperature source Oral, resp. rate 18, height 5\' 8"  (1.727 m), weight 128.5 kg (283 lb 4.7 oz), SpO2 94.00%.  Intake/Output Summary (Last 24 hours) at 04/28/13 1424 Last data filed at 04/27/13 2300  Gross per 24 hour  Intake    270 ml  Output      0 ml  Net    270 ml     Exam: General: Awake alert oriented x3, No acute respiratory distress Lungs: Crackles at bases, no wheezing Cardiovascular: Regular rate and rhythm without murmur gallop or rub normal S1 and S2 Abdomen: Nontender, nondistended, soft, bowel sounds positive, no rebound, no ascites, no appreciable mass Extremities: No significant cyanosis, clubbing-2+ edema bilateral lower extremities  Data Reviewed: Basic Metabolic Panel:  Recent Labs Lab 04/27/13 1702 04/27/13 2350 04/28/13 0518  NA 131*  --  135  K 3.4*  --  3.5  CL 97  --  100  CO2 25  --  27  GLUCOSE 200*  --  183*  BUN 18  --  17  CREATININE 0.98 0.98 1.01  CALCIUM 8.0*  --  7.9*  MG  --   --  1.6   Liver Function Tests:  Recent Labs Lab 04/27/13 1702  AST 36  ALT 9  ALKPHOS 85  BILITOT 0.9  PROT 5.4*  ALBUMIN 1.8*   No results found for this basename: LIPASE, AMYLASE,  in the last 168 hours No results found for this basename: AMMONIA,  in the last 168 hours CBC:  Recent Labs Lab 04/27/13 1702 04/27/13 2350 04/28/13 0518  WBC 5.7 5.8 4.7  NEUTROABS 4.1  --  3.3  HGB 13.7 13.4 12.7  HCT 41.0 39.3 37.7  MCV 89.7 88.9 88.7  PLT 156 143* 139*   Cardiac Enzymes:  Recent Labs Lab 04/27/13 2350 04/28/13 0518 04/28/13 1200   TROPONINI <0.30 <0.30 <0.30   BNP (last 3 results)  Recent Labs  05/11/12 1603 04/27/13 1733  PROBNP 2429.0* 2160.0*   CBG:  Recent Labs Lab 04/27/13 2229 04/28/13 0726 04/28/13 1207  GLUCAP 161* 170* 166*    Recent Results (from the past 240 hour(s))  MRSA PCR SCREENING     Status: Abnormal   Collection Time    04/28/13  3:27 AM      Result Value Range Status   MRSA by PCR POSITIVE (*) NEGATIVE Final   Comment:            The GeneXpert MRSA Assay (FDA     approved for NASAL specimens     only), is one component of a     comprehensive MRSA colonization     surveillance program. It is not     intended to diagnose MRSA     infection nor to guide or     monitor treatment for     20     MRSA infections.     RESULT CALLED TO, READ BACK BY AND VERIFIED WITH:     TO R. ZARSONA     RESULT CALLED TO, READ BACK BY AND VERIFIED WITH:     TO R. ZARSONA BY L. LOMAX AT 0543 ON 6     2014     Studies:  Recent x-ray studies have been reviewed in detail by the Attending Physician  Scheduled Meds:  Scheduled Meds: . aspirin EC  81 mg Oral Daily  . [START ON 04/29/2013] azithromycin  250 mg Oral Daily  . cefTRIAXone (ROCEPHIN)  IV  1 g Intravenous Q24H  . Chlorhexidine Gluconate Cloth  6 each Topical Q0600  . diltiazem  180 mg Oral Daily  . enalapril  2.5 mg Oral BID  . enoxaparin (LOVENOX) injection  40 mg Subcutaneous Q24H  . furosemide  40 mg Oral BID  . insulin aspart  0-15 Units Subcutaneous TID WC  . insulin aspart  0-5 Units Subcutaneous QHS  . metoprolol  25 mg Oral BID  . mupirocin ointment  1 application Nasal BID  . potassium chloride SA  20 mEq Oral BID  . sertraline  100 mg Oral QHS  . spironolactone  25 mg Oral Daily   Continuous Infusions:   Time spent on care of this patient: 35 minutes   Calvert Cantor, MD [phone]  [facility] Office  [phone] Pager - Text Page per Amion as per below:  On-Call/Text Page:      [email or web address]       password [email or web address]  If 7PM-7AM, please contact night-coverage [email or web address] Password [email or web address] 04/28/2013, 2:24 PM   LOS: 1 day

## 2013-04-28 NOTE — Progress Notes (Signed)
Pt rested some but now is wide awake, moderately anxious, complaining of "legs jumping".  Pt visibly and grossly shaking legs around in bed.  States "this place makes me nervous."  Dr Adela Glimpse informed of pt's desire to take another xanax, order received to repeat x 1.  Xanax given po.  Frequent peri care given due to urine incontinence but still no BM.

## 2013-04-28 NOTE — Progress Notes (Signed)
INITIAL NUTRITION ASSESSMENT  DOCUMENTATION CODES Per approved criteria  -Morbid Obesity   INTERVENTION: Glucerna Shake po BID, each supplement provides 220 kcal and 10 grams of protein.  NUTRITION DIAGNOSIS: Inadequate oral intake related to decreased appetetite as evidenced by pt report.   Goal: Pt to meet >/= 90% of their estimated nutrition needs.   Monitor:  PO intake, weight trend  Reason for Assessment: MD consult  79 y.o. female  Admitting Dx: Atrial fibrillation with RVR  ASSESSMENT: Pt reports decrease in appetite with mixed emotional eating PTA. Per pt she lost her son a year ago but did not deal with it. Pt will go without eating at some meals. Other times pt will eat junk food she should not be eating. She reports that she has had weight loss while in SNF due to not liking food. Per pt her husband does the grocery shopping and that they both know what to do but she has not been following a low sodium, diabetic diet at home. Pt declines education at this time feels she knows what she needs to do. Pt reports that she just started taking her anti-anxiety medication again.   Nutrition Focused Physical Exam:  Subcutaneous Fat:  Orbital Region: WNL Upper Arm Region: WNL Thoracic and Lumbar Region: WNL  Muscle:  Temple Region: WNL Clavicle Bone Region: mild-moderate wasting Clavicle and Acromion Bone Region: mild-moderate wasting Scapular Bone Region: WNL Dorsal Hand: mild-moderate wasting Patellar Region: WNL Anterior Thigh Region: WNL Posterior Calf Region: WNL  Edema: present due to CHF   Height: Ht Readings from Last 1 Encounters:  04/27/13 5\' 8"  (1.727 m)    Weight: Wt Readings from Last 1 Encounters:  04/27/13 283 lb 4.7 oz (128.5 kg)    Ideal Body Weight: 63.6 kg  % Ideal Body Weight: 202%  Wt Readings from Last 10 Encounters:  04/27/13 283 lb 4.7 oz (128.5 kg)  08/11/12 233 lb 8 oz (105.915 kg)  03/24/12 276 lb 0.3 oz (125.2 kg)  09/01/11  260 lb (117.935 kg)  04/29/10 280 lb (127.007 kg)    Usual Body Weight: 280 lb  % Usual Body Weight: -  BMI:  Body mass index is 43.08 kg/(m^2).  Estimated Nutritional Needs: Kcal: 2000-2100 Protein: 100-115 grams Fluid: > 2 L/day  Skin: right lower leg weeping  Diet Order: Cardiac Meal Completion: 100%  EDUCATION NEEDS: -Education needs addressed   Intake/Output Summary (Last 24 hours) at 04/28/13 1453 Last data filed at 04/27/13 2300  Gross per 24 hour  Intake    270 ml  Output      0 ml  Net    270 ml    Last BM: PTA   Labs:   Recent Labs Lab 04/27/13 1702 04/27/13 2350 04/28/13 0518  NA 131*  --  135  K 3.4*  --  3.5  CL 97  --  100  CO2 25  --  27  BUN 18  --  17  CREATININE 0.98 0.98 1.01  CALCIUM 8.0*  --  7.9*  MG  --   --  1.6  GLUCOSE 200*  --  183*    CBG (last 3)   Recent Labs  04/27/13 2229 04/28/13 0726 04/28/13 1207  GLUCAP 161* 170* 166*   Lab Results  Component Value Date   HGBA1C 10.6* 04/27/2013    Scheduled Meds: . aspirin EC  81 mg Oral Daily  . cefTRIAXone (ROCEPHIN)  IV  1 g Intravenous Q24H  . Chlorhexidine Gluconate  Cloth  6 each Topical Q0600  . diltiazem  180 mg Oral Daily  . enalapril  2.5 mg Oral BID  . enoxaparin (LOVENOX) injection  40 mg Subcutaneous Q24H  . furosemide  40 mg Intravenous Q8H  . insulin aspart  0-15 Units Subcutaneous TID WC  . insulin aspart  0-5 Units Subcutaneous QHS  . insulin glargine  15 Units Subcutaneous QHS  . metoprolol  25 mg Oral BID  . mupirocin ointment  1 application Nasal BID  . potassium chloride SA  20 mEq Oral BID  . sertraline  100 mg Oral QHS  . spironolactone  25 mg Oral Daily    Continuous Infusions:   Past Medical History  Diagnosis Date  . Hypertension   . Diabetes mellitus   . Atrial fibrillation   . MI (myocardial infarction)   . Anxiety   . Hypercholesterolemia   . Shoulder pain   . Sleep apnea     intolerant to CPAP  . Type 2 diabetes mellitus    . OSA (obstructive sleep apnea)   . Hypertensive heart disease   . Morbid obesity   . Pyogenic granuloma   . Cancer     cervical  . Depression   . Pneumonia   . CHF (congestive heart failure)     Past Surgical History  Procedure Laterality Date  . Knee surgery      at age 89  . Cataract surgery      both eyes  . Abdominal hysterectomy    . Toe amputation      Kendell Bane RD, LDN, CNSC [phone] Pager [phone] After Hours Pager

## 2013-04-28 NOTE — Progress Notes (Signed)
UR Completed Krystyne Tewksbury Graves-Bigelow, RN,BSN [phone]  

## 2013-04-28 NOTE — Evaluation (Signed)
Physical Therapy Evaluation Patient Details Name: Tara Huff MRN: [id number] DOB: [birth year] Today's Date: 04/28/2013 Time: 4098-1191 PT Time Calculation (min): 17 min  PT Assessment / Plan / Recommendation Clinical Impression    Pt admitted with anasarca and diarrhea. Pt currently with functional limitations due to the deficits listed below (see PT Problem List).  Pt will benefit from skilled PT to increase their independence and safety with mobility to allow discharge possible to ST-SNF prior to return home.  Currently feel pt needs SNF but if she progresses rapidly this may change.      PT Assessment  Patient needs continued PT services    Follow Up Recommendations  SNF    Does the patient have the potential to tolerate intense rehabilitation      Barriers to Discharge Decreased caregiver support      Equipment Recommendations  None recommended by PT    Recommendations for Other Services     Frequency Min 3X/week    Precautions / Restrictions Precautions Precautions: Fall Restrictions Weight Bearing Restrictions: No   Pertinent Vitals/Pain N/A      Mobility  Bed Mobility Bed Mobility: Rolling Right;Rolling Left;Supine to Sit;Sitting - Scoot to [place] of Bed;Sit to Supine;Scooting to [facility] Rolling Right: 3: Mod assist;With rail Rolling Left: 3: Mod assist;With rail Supine to Sit: 1: +2 Total assist;HOB elevated;With rails Supine to Sit: Patient Percentage: 60% Sitting - Scoot to Edge of Bed: 1: +2 Total assist Sitting - Scoot to Edge of Bed: Patient Percentage: 60% Sit to Supine: 1: +2 Total assist Sit to Supine: Patient Percentage: 60% Scooting to HOB: 1: +2 Total assist Scooting to [facility]: Patient Percentage: 0% Details for Bed Mobility Assistance: Assist to manage pt's legs and to raise/lower trunk. Transfers Transfers: Not assessed (Pt didn't feel she was strong enough)    Exercises     PT Diagnosis: Difficulty walking;Generalized weakness  PT Problem  List: Decreased strength;Decreased mobility;Obesity;Decreased activity tolerance PT Treatment Interventions: DME instruction;Gait training;Patient/family education;Functional mobility training;Therapeutic activities;Therapeutic exercise   PT Goals Acute Rehab PT Goals PT Goal Formulation: With patient Time For Goal Achievement: 05/05/13 Potential to Achieve Goals: Good Pt will go Supine/Side to Sit: with min assist PT Goal: Supine/Side to Sit - Progress: Goal set today Pt will go Sit to Supine/Side: with min assist PT Goal: Sit to Supine/Side - Progress: Goal set today Pt will go Sit to Stand: with min assist PT Goal: Sit to Stand - Progress: Goal set today Pt will go Stand to Sit: with min assist PT Goal: Stand to Sit - Progress: Goal set today Pt will Ambulate: 1 - 15 feet;with min assist;with least restrictive assistive device PT Goal: Ambulate - Progress: Goal set today  Visit Information  Last PT Received On: 04/28/13 Assistance Needed: +2    Subjective Data  Subjective: Pt states she thinks she will be able to go home. Patient Stated Goal: Return home   Prior Functioning  Home Living Lives With: Spouse Available Help at Discharge: Family;Available PRN/intermittently Type of Home: House Home Layout: Two level;Able to live on main level with bedroom/bathroom Bathroom Shower/Tub:  (pt sponge bathes) Bathroom Accessibility: Yes How Accessible: Accessible via wheelchair;Accessible via walker Home Adaptive Equipment: Bedside commode/3-in-1;Raised toilet seat with rails;Reacher;Shower chair with back;Walker - rolling;Wheelchair - manual Prior Function Level of Independence: Independent with assistive device(s) Vocation: Retired Comments: Pt reports she uses a combination of walker and w/c to perform ADL's. Communication Communication: No difficulties    Cognition  Cognition Arousal/Alertness: Awake/alert  Behavior During Therapy: WFL for tasks assessed/performed Overall  Cognitive Status: Within Functional Limits for tasks assessed    Extremity/Trunk Assessment Right Lower Extremity Assessment RLE ROM/Strength/Tone: Deficits RLE ROM/Strength/Tone Deficits: grossly <3/5 Left Lower Extremity Assessment LLE ROM/Strength/Tone: Deficits LLE ROM/Strength/Tone Deficits: grossly <3/5   Balance Balance Balance Assessed: Yes Static Sitting Balance Static Sitting - Balance Support: Bilateral upper extremity supported;Feet unsupported Static Sitting - Level of Assistance: 5: Stand by assistance Static Sitting - Comment/# of Minutes: Sat 7-8 minutes but legs uncomfortable due to bedrail underneath them and feet dangling.  End of Session PT - End of Session Activity Tolerance: Patient limited by fatigue Patient left: in bed;with call bell/phone within reach Nurse Communication: Mobility status  GP     [facility] 04/28/2013, 11:36 AM  [facility] PT [phone]

## 2013-04-28 NOTE — Progress Notes (Signed)
Pt states tired of lying in bed, insisted on getting up on side of bed to dangle legs.  Pt very weak and requires much assist from staff.  Frequent peri care continues, area less reddened than upon admission.  Back to bed and assisted onto right side after much encouragement.  Becomes easily anxious but calms with reassurance and staff presence.

## 2013-04-28 NOTE — Evaluation (Signed)
Occupational Therapy Evaluation Patient Details Name: Tara Huff MRN: [id number] DOB: Jun 28, 1934 Today's Date: 04/28/2013 Time: 6578-4696 OT Time Calculation (min): 17 min  OT Assessment / Plan / Recommendation Clinical Impression  Pt admitted with anasarca and diarrhea. Pt will benefit from continued acute OT services to address below problem list. Recommending SNF for d/c planning.    OT Assessment  Patient needs continued OT Services    Follow Up Recommendations  SNF    Barriers to Discharge Decreased caregiver support Husband works during day.  Equipment Recommendations  None recommended by OT    Recommendations for Other Services    Frequency  Min 2X/week    Precautions / Restrictions Precautions Precautions: Fall   Pertinent Vitals/Pain See vitals    ADL  Grooming: Performed;Set up;Brushing hair Where Assessed - Grooming: Unsupported sitting Upper Body Bathing: Simulated;Supervision/safety Where Assessed - Upper Body Bathing: Unsupported sitting Lower Body Bathing: Simulated;Moderate assistance Where Assessed - Lower Body Bathing: Unsupported sitting Upper Body Dressing: Simulated;Supervision/safety Where Assessed - Upper Body Dressing: Unsupported sitting Lower Body Dressing: Simulated;+1 Total assistance Where Assessed - Lower Body Dressing: Unsupported sitting Transfers/Ambulation Related to ADLs: Pt refusing to mobilize OOB but agreeable to sitting EOB.    OT Diagnosis: Generalized weakness  OT Problem List: Decreased strength;Decreased activity tolerance;Decreased knowledge of use of DME or AE;Obesity;Increased edema OT Treatment Interventions: Self-care/ADL training;DME and/or AE instruction;Therapeutic activities;Patient/family education   OT Goals Acute Rehab OT Goals OT Goal Formulation: With patient Time For Goal Achievement: 05/12/13 Potential to Achieve Goals: Good ADL Goals Pt Will Perform Lower Body Bathing: with supervision;Sit to stand  from chair;Sit to stand from bed;with adaptive equipment ADL Goal: Lower Body Bathing - Progress: Goal set today Pt Will Perform Lower Body Dressing: with supervision;Sit to stand from chair;Sit to stand from bed;with adaptive equipment ADL Goal: Lower Body Dressing - Progress: Goal set today Pt Will Transfer to Toilet: with supervision;Stand pivot transfer;with DME;Extra wide 3-in-1 ADL Goal: Toilet Transfer - Progress: Goal set today Pt Will Perform Toileting - Clothing Manipulation: with supervision;Sitting on 3-in-1 or toilet;Standing ADL Goal: Toileting - Clothing Manipulation - Progress: Goal set today Pt Will Perform Toileting - Hygiene: with supervision;Sit to stand from 3-in-1/toilet ADL Goal: Toileting - Hygiene - Progress: Goal set today Miscellaneous OT Goals Miscellaneous OT Goal #1: Pt will perform bed mobility at supervision level as precursor for EOB ADLs. OT Goal: Miscellaneous Goal #1 - Progress: Goal set today  Visit Information  Last OT Received On: 04/28/13 Assistance Needed: +2 PT/OT Co-Evaluation/Treatment: Yes    Subjective Data      Prior Functioning     Home Living Lives With: Spouse Available Help at Discharge: Family;Available PRN/intermittently Type of Home: House Home Layout: Two level;Able to live on main level with bedroom/bathroom Bathroom Shower/Tub:  (pt sponge bathes) Bathroom Accessibility: Yes How Accessible: Accessible via wheelchair;Accessible via walker Home Adaptive Equipment: Bedside commode/3-in-1;Raised toilet seat with rails;Reacher;Shower chair with back;Walker - rolling;Wheelchair - manual Prior Function Level of Independence: Independent with assistive device(s) Vocation: Retired Comments: Pt reports she uses a combination of walker and w/c to perform ADL's. Communication Communication: No difficulties         Vision/Perception Vision - History Baseline Vision: Wears glasses all the time   Cognition   Cognition Arousal/Alertness: Awake/alert Behavior During Therapy: WFL for tasks assessed/performed Overall Cognitive Status: Within Functional Limits for tasks assessed    Extremity/Trunk Assessment Right Upper Extremity Assessment RUE ROM/Strength/Tone: [facility] for tasks assessed Left Upper Extremity Assessment LUE  ROM/Strength/Tone: [facility] for tasks assessed Right Lower Extremity Assessment RLE ROM/Strength/Tone: Deficits RLE ROM/Strength/Tone Deficits: grossly <3/5 Left Lower Extremity Assessment LLE ROM/Strength/Tone: Deficits LLE ROM/Strength/Tone Deficits: grossly <3/5     Mobility Bed Mobility Bed Mobility: Rolling Right;Rolling Left;Supine to Sit;Sitting - Scoot to [place] of Bed;Sit to Supine;Scooting to [facility] Rolling Right: 3: Mod assist;With rail Rolling Left: 3: Mod assist;With rail Supine to Sit: 1: +2 Total assist;HOB elevated;With rails Supine to Sit: Patient Percentage: 60% Sitting - Scoot to Edge of Bed: 1: +2 Total assist Sitting - Scoot to Edge of Bed: Patient Percentage: 60% Sit to Supine: 1: +2 Total assist Sit to Supine: Patient Percentage: 60% Scooting to HOB: 1: +2 Total assist Scooting to [facility]: Patient Percentage: 0% Details for Bed Mobility Assistance: Assist to manage pt's legs and to raise/lower trunk.     Exercise     Balance Balance Balance Assessed: Yes Static Sitting Balance Static Sitting - Balance Support: Bilateral upper extremity supported;Feet unsupported Static Sitting - Level of Assistance: 5: Stand by assistance Static Sitting - Comment/# of Minutes: Sat EOB ~8 minutes   End of Session OT - End of Session Activity Tolerance: Patient limited by fatigue Patient left: in bed;with call bell/phone within reach Nurse Communication: Mobility status  GO   04/28/2013 Cipriano Mile OTR/L Pager [phone] Office [phone]   Cipriano Mile 04/28/2013, 1:26 PM

## 2013-04-29 DIAGNOSIS — I119 Hypertensive heart disease without heart failure: Secondary | ICD-10-CM

## 2013-04-29 DIAGNOSIS — I10 Essential (primary) hypertension: Secondary | ICD-10-CM

## 2013-04-29 DIAGNOSIS — E8809 Other disorders of plasma-protein metabolism, not elsewhere classified: Secondary | ICD-10-CM

## 2013-04-29 LAB — BASIC METABOLIC PANEL
BUN: 18 mg/dL (ref 6–23)
Calcium: 8 mg/dL — ABNORMAL LOW (ref 8.4–10.5)
Creatinine, Ser: 1.06 mg/dL (ref 0.50–1.10)
GFR calc Af Amer: 58 mL/min — ABNORMAL LOW (ref >90)
GFR calc non Af Amer: 50 mL/min — ABNORMAL LOW (ref >90)
Potassium: 3.6 mEq/L (ref 3.5–5.1)

## 2013-04-29 LAB — GLUCOSE, CAPILLARY
Glucose-Capillary: 126 mg/dL — ABNORMAL HIGH (ref 70–99)
Glucose-Capillary: 143 mg/dL — ABNORMAL HIGH (ref 70–99)
Glucose-Capillary: 150 mg/dL — ABNORMAL HIGH (ref 70–99)

## 2013-04-29 NOTE — Progress Notes (Signed)
Physical Therapy Treatment Patient Details Name: Tara Huff MRN: [id number] DOB: 1934/07/30 Today's Date: 04/29/2013 Time: 4132-4401 PT Time Calculation (min): 32 min  PT Assessment / Plan / Recommendation Comments on Treatment Session  Pt received in bed incontinent of urine and minimal stool. Hygiene and bathing performed.  Patient required assist for bed mobility and rolling during bathing and hygiene process despite cues for techniques and positioning.  After self care and hygiene, patient assist to chair.  At this time patient still requiring significant amount of assistance (2 person).  Spoke with patient about safety concerns for discharge.  Made clear to patient that safest recommendation would be for discharge to ST SNF as patient is significant increased fall risk for mobility and under current abilities would be bed bound without assistance.  Pt states that husband works and that there are no family or friends available to provide the necessary level of assistance (+2 all mobility). Patient still insisting on HHPT.  Do not feel patient is safe for discharge home at current mobility levels.  Will continue to see as indicated and continue to rec ST SNF upon discharge.     Follow Up Recommendations  SNF     Does the patient have the potential to tolerate intense rehabilitation     Barriers to Discharge Decreased caregiver support      Equipment Recommendations  None recommended by PT    Recommendations for Other Services    Frequency Min 3X/week   Plan Discharge plan remains appropriate    Precautions / Restrictions Precautions Precautions: Fall Restrictions Weight Bearing Restrictions: No   Pertinent Vitals/Pain No pain at this time    Mobility  Bed Mobility Bed Mobility: Rolling Right;Rolling Left;Supine to Sit;Sitting - Scoot to [place] of Bed Rolling Right: 4: Min assist Rolling Left: 4: Min assist Supine to Sit: 3: Mod assist;With rails;HOB elevated Sitting -  Scoot to Edge of Bed: 3: Mod assist (with chuck pad used to assist pulling trunk to EOB) Details for Bed Mobility Assistance: Assist to manage pt's legs and to raise/lower trunk. Transfers Transfers: Sit to Stand;Stand to Sit;Stand Pivot Transfers Sit to Stand: 1: +2 Total assist;From elevated surface;From bed Sit to Stand: Patient Percentage: 60% Stand to Sit: 1: +2 Total assist;With armrests;To chair/3-in-1 Stand to Sit: Patient Percentage: 60% Stand Pivot Transfers: 1: +2 Total assist Stand Pivot Transfers: Patient Percentage: 60% Details for Transfer Assistance: Patient unable to perform transfers x2 without assist; assist provided patient still unable to perform; bed elevated and patient able to perform with +2 person assist. Once standing, patient able to pivot to chair with +2 person assist. Ambulation/Gait Ambulation/Gait Assistance: Not tested (comment)        PT Diagnosis: Difficulty walking;Generalized weakness  PT Problem List: Decreased strength;Decreased mobility;Obesity;Decreased activity tolerance PT Treatment Interventions: DME instruction;Gait training;Patient/family education;Functional mobility training;Therapeutic activities;Therapeutic exercise   PT Goals Acute Rehab PT Goals PT Goal Formulation: With patient Time For Goal Achievement: 05/05/13 Potential to Achieve Goals: Good Pt will go Supine/Side to Sit: with min assist PT Goal: Supine/Side to Sit - Progress: Progressing toward goal Pt will go Sit to Supine/Side: with min assist PT Goal: Sit to Supine/Side - Progress: Progressing toward goal Pt will go Sit to Stand: with min assist PT Goal: Sit to Stand - Progress: Progressing toward goal Pt will go Stand to Sit: with min assist PT Goal: Stand to Sit - Progress: Progressing toward goal Pt will Ambulate: 1 - 15 feet;with min assist;with least restrictive assistive  device PT Goal: Ambulate - Progress: Not met  Visit Information  Last PT Received On:  04/29/13 Assistance Needed: +2    Subjective Data  Subjective: I need to be cleaned up Patient Stated Goal: Return home   Cognition  Cognition Arousal/Alertness: Awake/alert Behavior During Therapy: WFL for tasks assessed/performed Overall Cognitive Status: Within Functional Limits for tasks assessed    Balance  Balance Balance Assessed: Yes Static Sitting Balance Static Sitting - Balance Support: Bilateral upper extremity supported;Feet unsupported Static Sitting - Level of Assistance: 5: Stand by assistance Static Sitting - Comment/# of Minutes: EOB with assist for LE hygiene and dressing.   End of Session PT - End of Session Equipment Utilized During Treatment: Gait belt Activity Tolerance: Patient limited by fatigue Patient left: in chair;with call bell/phone within reach Nurse Communication: Mobility status   GP     Fabio Asa 04/29/2013, 1:37 PM Charlotte Crumb, PT DPT  [phone]

## 2013-04-29 NOTE — Clinical Social Work Note (Signed)
CSW provided patient with a packet of information gathered by CSW Theresia Bough. Patient talked with CSW regarding therapists working with her and recommending rehab at discharge. Mrs. Krist stated that she does not want to go to a facility for rehab and her plan is to return home. Patient explained that her husband will be with her for a couple of days after discharge and she has a wheelchair and other equipment at home so that she will be safe. Patient stated that she has been to facilities before for rehab and does not want to go to a facility for rehab at this time. CSW and patient also talked about transportation services for medical appointments and transport to get her home at discharge.  Patient thanked CSW for the packet of information provided and for her time in talking about her other concerns.   Genelle Bal, MSW, LCSW [phone]

## 2013-04-29 NOTE — Progress Notes (Signed)
Patient ID: Tara Huff  female  ZOX:[id number]    DOB: 1934-09-11    DOA: 04/27/2013  PCP: Allean Found, MD  Assessment/Plan: Principal Problem: Atrial fibrillation with RVR - not new -Heart rate controlled on oral Cardizem  -Patient is not on anticoagulation, reviewed prior records, patient has a history of atrial fibrillation and has not been on any anticoagulation. - on asa   Active Problems:  Pulmonary edema/moderate tricuspid regurgitation/severely dilated left atrium  - 2-D echo pending, Continue Lasix and monitor I and O.'s  Escherichia coli Urinary tract infection  Continue Rocephin,    Hypertension  BP slightly low-continue to monitor   Type 2 diabetes mellitus  -Continue Lantus and moderate sliding scale  -A1c is 10.6   Diarrhea - improving  Hyponatremia - stable  Hypoalbuminemia  -likely the cause of her anasarca in addition to heart failure  DVT Prophylaxis:  Code Status:  Disposition: Physical therapy recommending skilled nursing facility    Subjective: Feels improving, no chest pain, no shortness of breath significant anasarca  Objective: Weight change: -4.396 kg (-9 lb 11.1 oz) No intake or output data in the 24 hours ending 04/29/13 1627 Blood pressure 107/58, pulse 85, temperature 97.8 F (36.6 C), temperature source Axillary, resp. rate 18, height 5\' 8"  (1.727 m), weight 124.104 kg (273 lb 9.6 oz), SpO2 97.00%.  Physical Exam: General: Alert and awake, oriented x3, not in any acute distress. CVS: S1-S2 clear, no murmur rubs or gallops Chest: clear to auscultation bilaterally, no wheezing, rales or rhonchi Abdomen: Obese, Anasarca, soft nontender,normal bowel sounds  Extremities: no cyanosis, clubbing , 2+ edema noted bilaterally   Lab Results: Basic Metabolic Panel:  Recent Labs Lab 04/28/13 0518 04/29/13 0610  NA 135 135  K 3.5 3.6  CL 100 99  CO2 27 29  GLUCOSE 183* 138*  BUN 17 18  CREATININE 1.01 1.06  CALCIUM 7.9*  8.0*  MG 1.6  --    Liver Function Tests:  Recent Labs Lab 04/27/13 1702  AST 36  ALT 9  ALKPHOS 85  BILITOT 0.9  PROT 5.4*  ALBUMIN 1.8*   No results found for this basename: LIPASE, AMYLASE,  in the last 168 hours No results found for this basename: AMMONIA,  in the last 168 hours CBC:  Recent Labs Lab 04/27/13 2350 04/28/13 0518  WBC 5.8 4.7  NEUTROABS  --  3.3  HGB 13.4 12.7  HCT 39.3 37.7  MCV 88.9 88.7  PLT 143* 139*   Cardiac Enzymes:  Recent Labs Lab 04/27/13 2350 04/28/13 0518 04/28/13 1200  TROPONINI <0.30 <0.30 <0.30   BNP: No components found with this basename: POCBNP,  CBG:  Recent Labs Lab 04/28/13 1207 04/28/13 1724 04/28/13 2119 04/29/13 0746 04/29/13 1151  GLUCAP 166* 188* 191* 126* 126*     Micro Results: Recent Results (from the past 240 hour(s))  URINE CULTURE     Status: None   Collection Time    04/27/13  5:24 PM      Result Value Range Status   Specimen Description URINE, CATHETERIZED   Final   Special Requests NONE   Final   Culture  Setup Time 04/28/2013 02:37   Final   Colony Count >=100,000 COLONIES/ML   Final   Culture ESCHERICHIA COLI   Final   Report Status PENDING   Incomplete  MRSA PCR SCREENING     Status: Abnormal   Collection Time    04/28/13  3:27 AM  Result Value Range Status   MRSA by PCR POSITIVE (*) NEGATIVE Final   Comment:            The GeneXpert MRSA Assay (FDA     approved for NASAL specimens     only), is one component of a     comprehensive MRSA colonization     surveillance program. It is not     intended to diagnose MRSA     infection nor to guide or     monitor treatment for     20     MRSA infections.     RESULT CALLED TO, READ BACK BY AND VERIFIED WITH:     TO R. ZARSONA     RESULT CALLED TO, READ BACK BY AND VERIFIED WITH:     TO R. ZARSONA BY L. LOMAX AT 0543 ON 6     2014    Studies/Results: Dg Chest Port 1 View  04/27/2013   *RADIOLOGY REPORT*  Clinical Data:  Swelling in legs, edema, cough, COPD  PORTABLE CHEST - 1 VIEW  Comparison: The multiple prior chest radiographs, most recent, 03/19/2012.  CT chest 05/27/2011  Findings: Stable cardiomegaly.  There is a prominent right paratracheal soft tissue contour, which which is likely accentuated by patient rotation to the right.  This appears new compared to prior portable chest radiograph of 03/19/2012.  There is diffuse pulmonary vascular congestion and interstitial prominence.  No focal consolidation, visible pleural effusion, or pneumothorax. Bones unremarkable.  IMPRESSION:  1.  Cardiomegaly with pulmonary vascular congestion and probable interstitial pulmonary edema. 2.  Prominent right paratracheal soft tissue contour, likely accentuated by patient rotation to the right.  Benign prominence related to ectatic vessels/vascular congestion is a consideration. Lymphadenopathy cannot be excluded.  Consider initial evaluation with a two-view chest radiograph.   Original Report Authenticated By: Britta Mccreedy, M.D.    Medications: Scheduled Meds: . aspirin EC  81 mg Oral Daily  . cefTRIAXone (ROCEPHIN)  IV  1 g Intravenous Q24H  . Chlorhexidine Gluconate Cloth  6 each Topical Q0600  . diltiazem  180 mg Oral Daily  . enalapril  2.5 mg Oral BID  . enoxaparin (LOVENOX) injection  40 mg Subcutaneous Q24H  . feeding supplement  237 mL Oral BID BM  . furosemide  40 mg Intravenous Q8H  . insulin aspart  0-15 Units Subcutaneous TID WC  . insulin aspart  0-5 Units Subcutaneous QHS  . insulin glargine  15 Units Subcutaneous QHS  . metoprolol  25 mg Oral BID  . mupirocin ointment  1 application Nasal BID  . potassium chloride SA  20 mEq Oral BID  . sertraline  100 mg Oral QHS  . spironolactone  25 mg Oral Daily      LOS: 2 days   Shaunee Mulkern M.D. [facility] 04/29/2013, 4:27 PM Pager: [phone]  If 7PM-7AM, please contact night-coverage [email or web address] Password [email or web address]

## 2013-04-30 DIAGNOSIS — R5381 Other malaise: Secondary | ICD-10-CM

## 2013-04-30 DIAGNOSIS — I4891 Unspecified atrial fibrillation: Secondary | ICD-10-CM

## 2013-04-30 DIAGNOSIS — E871 Hypo-osmolality and hyponatremia: Secondary | ICD-10-CM

## 2013-04-30 DIAGNOSIS — E8809 Other disorders of plasma-protein metabolism, not elsewhere classified: Secondary | ICD-10-CM

## 2013-04-30 DIAGNOSIS — N390 Urinary tract infection, site not specified: Secondary | ICD-10-CM

## 2013-04-30 DIAGNOSIS — E119 Type 2 diabetes mellitus without complications: Secondary | ICD-10-CM

## 2013-04-30 DIAGNOSIS — I10 Essential (primary) hypertension: Secondary | ICD-10-CM

## 2013-04-30 LAB — URINE CULTURE: Colony Count: >=100000

## 2013-04-30 LAB — BASIC METABOLIC PANEL
Calcium: 8 mg/dL — ABNORMAL LOW (ref 8.4–10.5)
GFR calc non Af Amer: 50 mL/min — ABNORMAL LOW (ref >90)
Sodium: 133 mEq/L — ABNORMAL LOW (ref 135–145)

## 2013-04-30 MED ORDER — LEVOFLOXACIN IN D5W 750 MG/150ML IV SOLN
750.0000 mg | INTRAVENOUS | Status: DC
  Administered 2013-04-30 – 2013-05-01 (×2): 750 mg via INTRAVENOUS
  Filled 2013-04-30 (×3): qty 150

## 2013-04-30 NOTE — Progress Notes (Signed)
Patient ID: Tara Huff  female  RUE:[id number]    DOB: [birth year]    DOA: 04/27/2013  PCP: Allean Found, MD  Assessment/Plan: Principal Problem: Atrial fibrillation with RVR - not new -Heart rate controlled on oral Cardizem  -Patient is not on anticoagulation, reviewed prior records, patient has a history of atrial fibrillation and has not been on any anticoagulation. - on asa, 2-D echo results pending today   Active Problems:  Pulmonary edema/moderate tricuspid regurgitation/severely dilated left atrium  - 2-D echo pending, Continue Lasix and monitor I and O.'s  ESBL Escherichia coli Urinary tract infection  Placed on levofloxacin, will continue 2 weeks   Hypertension  BP slightly low-continue to monitor   Type 2 diabetes mellitus  -Continue Lantus and moderate sliding scale  -A1c is 10.6   Diarrhea - improving  Hyponatremia - stable  Hypoalbuminemia  -likely the cause of her anasarca in addition to heart failure  DVT Prophylaxis:  Code Status:  Disposition: Physical therapy recommending skilled nursing facility, discussed in detail with the patient she absolutely does not want skilled nursing facility. Continue medical management, arrange home health tomorrow.    Subjective: Denies any chest pain feels lower extremity swelling is improving  Objective: Weight change:   Intake/Output Summary (Last 24 hours) at 04/30/13 1142 Last data filed at 04/30/13 0900  Gross per 24 hour  Intake    840 ml  Output      0 ml  Net    840 ml   Blood pressure 106/65, pulse 96, temperature 98 F (36.7 C), temperature source Axillary, resp. rate 18, height 5\' 8"  (1.727 m), weight 124.104 kg (273 lb 9.6 oz), SpO2 97.00%.  Physical Exam: General: Alert and awake, oriented x3, not in any acute distress. CVS: S1-S2 clear, no mrg Chest: clear to auscultation bilaterally, no wheezing, rales or rhonchi Abdomen: Obese, Anasarca, soft nontender,normal bowel sounds   Extremities: no c/c, 2+ edema noted bilaterally   Lab Results: Basic Metabolic Panel:  Recent Labs Lab 04/28/13 0518 04/29/13 0610 04/30/13 0516  NA 135 135 133*  K 3.5 3.6 4.0  CL 100 99 99  CO2 27 29 26   GLUCOSE 183* 138* 115*  BUN 17 18 23   CREATININE 1.01 1.06 1.06  CALCIUM 7.9* 8.0* 8.0*  MG 1.6  --   --    Liver Function Tests:  Recent Labs Lab 04/27/13 1702  AST 36  ALT 9  ALKPHOS 85  BILITOT 0.9  PROT 5.4*  ALBUMIN 1.8*   No results found for this basename: LIPASE, AMYLASE,  in the last 168 hours No results found for this basename: AMMONIA,  in the last 168 hours CBC:  Recent Labs Lab 04/27/13 2350 04/28/13 0518  WBC 5.8 4.7  NEUTROABS  --  3.3  HGB 13.4 12.7  HCT 39.3 37.7  MCV 88.9 88.7  PLT 143* 139*   Cardiac Enzymes:  Recent Labs Lab 04/27/13 2350 04/28/13 0518 04/28/13 1200  TROPONINI <0.30 <0.30 <0.30   BNP: No components found with this basename: POCBNP,  CBG:  Recent Labs Lab 04/29/13 0746 04/29/13 1151 04/29/13 1724 04/29/13 2133 04/30/13 0737  GLUCAP 126* 126* 143* 150* 121*     Micro Results: Recent Results (from the past 240 hour(s))  URINE CULTURE     Status: None   Collection Time    04/27/13  5:24 PM      Result Value Range Status   Specimen Description URINE, CATHETERIZED   Final   Special Requests  NONE   Final   Culture  Setup Time 04/28/2013 02:37   Final   Colony Count >=100,000 COLONIES/ML   Final   Culture     Final   Value: ESCHERICHIA COLI     Note: Confirmed Extended Spectrum Beta-Lactamase Producer (ESBL)     Note: CRITICAL RESULT CALLED TO, READ BACK BY AND VERIFIED WITH: Westley Chandler RN on 04/30/13 at 05:50 by Christie Nottingham   Report Status 04/30/2013 FINAL   Final   Organism ID, Bacteria ESCHERICHIA COLI   Final  MRSA PCR SCREENING     Status: Abnormal   Collection Time    04/28/13  3:27 AM      Result Value Range Status   MRSA by PCR POSITIVE (*) NEGATIVE Final   Comment:            The  GeneXpert MRSA Assay (FDA     approved for NASAL specimens     only), is one component of a     comprehensive MRSA colonization     surveillance program. It is not     intended to diagnose MRSA     infection nor to guide or     monitor treatment for     20     MRSA infections.     RESULT CALLED TO, READ BACK BY AND VERIFIED WITH:     TO R. ZARSONA     RESULT CALLED TO, READ BACK BY AND VERIFIED WITH:     TO R. ZARSONA BY L. LOMAX AT 0543 ON 6     2014    Studies/Results: Dg Chest Port 1 View  04/27/2013   *RADIOLOGY REPORT*  Clinical Data: Swelling in legs, edema, cough, COPD  PORTABLE CHEST - 1 VIEW  Comparison: The multiple prior chest radiographs, most recent, 03/19/2012.  CT chest 05/27/2011  Findings: Stable cardiomegaly.  There is a prominent right paratracheal soft tissue contour, which which is likely accentuated by patient rotation to the right.  This appears new compared to prior portable chest radiograph of 03/19/2012.  There is diffuse pulmonary vascular congestion and interstitial prominence.  No focal consolidation, visible pleural effusion, or pneumothorax. Bones unremarkable.  IMPRESSION:  1.  Cardiomegaly with pulmonary vascular congestion and probable interstitial pulmonary edema. 2.  Prominent right paratracheal soft tissue contour, likely accentuated by patient rotation to the right.  Benign prominence related to ectatic vessels/vascular congestion is a consideration. Lymphadenopathy cannot be excluded.  Consider initial evaluation with a two-view chest radiograph.   Original Report Authenticated By: Britta Mccreedy, M.D.    Medications: Scheduled Meds: . aspirin EC  81 mg Oral Daily  . Chlorhexidine Gluconate Cloth  6 each Topical Q0600  . diltiazem  180 mg Oral Daily  . enalapril  2.5 mg Oral BID  . enoxaparin (LOVENOX) injection  40 mg Subcutaneous Q24H  . feeding supplement  237 mL Oral BID BM  . furosemide  40 mg Intravenous Q8H  . insulin aspart  0-15 Units  Subcutaneous TID WC  . insulin aspart  0-5 Units Subcutaneous QHS  . insulin glargine  15 Units Subcutaneous QHS  . levofloxacin (LEVAQUIN) IV  750 mg Intravenous Q24H  . metoprolol  25 mg Oral BID  . mupirocin ointment  1 application Nasal BID  . potassium chloride SA  20 mEq Oral BID  . sertraline  100 mg Oral QHS  . spironolactone  25 mg Oral Daily      LOS: 3 days   RAI,RIPUDEEP M.D.  [facility] 04/30/2013, 11:42 AM Pager: [phone]  If 7PM-7AM, please contact night-coverage [email or web address] Password [email or web address]

## 2013-04-30 NOTE — Progress Notes (Signed)
  Echocardiogram 2D Echocardiogram has been performed.  Tara Huff, [facility] 04/30/2013, 10:18 AM

## 2013-04-30 NOTE — Progress Notes (Signed)
Notified by lab of positive urine culture results. Notified Elray Mcgregor, NP. Treatment plan changed. Will continue to monitor. Earnest Conroy RN

## 2013-05-01 ENCOUNTER — Inpatient Hospital Stay (HOSPITAL_COMMUNITY): Payer: Medicare Other

## 2013-05-01 DIAGNOSIS — F411 Generalized anxiety disorder: Secondary | ICD-10-CM

## 2013-05-01 LAB — GLUCOSE, CAPILLARY
Glucose-Capillary: 184 mg/dL — ABNORMAL HIGH (ref 70–99)
Glucose-Capillary: 117 mg/dL — ABNORMAL HIGH (ref 70–99)
Glucose-Capillary: 133 mg/dL — ABNORMAL HIGH (ref 70–99)

## 2013-05-01 LAB — CLOSTRIDIUM DIFFICILE BY PCR: Toxigenic C. Difficile by PCR: NEGATIVE

## 2013-05-01 MED ORDER — LEVOFLOXACIN 750 MG PO TABS: 750.0000 mg | ORAL_TABLET | Freq: Every day | ORAL | Status: DC

## 2013-05-01 MED ORDER — SACCHAROMYCES BOULARDII 250 MG PO CAPS
250.0000 mg | ORAL_CAPSULE | Freq: Two times a day (BID) | ORAL | Status: DC
  Administered 2013-05-01 – 2013-05-02 (×3): 250 mg via ORAL
  Filled 2013-05-01 (×4): qty 1

## 2013-05-01 MED ORDER — METFORMIN HCL ER 500 MG PO TB24: 500.0000 mg | ORAL_TABLET | Freq: Every day | ORAL | Status: DC

## 2013-05-01 MED ORDER — IOHEXOL 350 MG/ML SOLN
100.0000 mL | Freq: Once | INTRAVENOUS | Status: AC | PRN
  Administered 2013-05-01: 100 mL via INTRAVENOUS

## 2013-05-01 MED ORDER — LOPERAMIDE HCL 2 MG PO CAPS: 2.0000 mg | ORAL_CAPSULE | Freq: Four times a day (QID) | ORAL | Status: DC | PRN

## 2013-05-01 MED ORDER — GLIMEPIRIDE 2 MG PO TABS: 2.0000 mg | ORAL_TABLET | Freq: Every day | ORAL | Status: DC

## 2013-05-01 MED ORDER — SACCHAROMYCES BOULARDII 250 MG PO CAPS: 250.0000 mg | ORAL_CAPSULE | Freq: Two times a day (BID) | ORAL | Status: DC

## 2013-05-01 MED ORDER — LOPERAMIDE HCL 2 MG PO CAPS
2.0000 mg | ORAL_CAPSULE | Freq: Three times a day (TID) | ORAL | Status: AC
  Administered 2013-05-01 – 2013-05-02 (×3): 2 mg via ORAL
  Filled 2013-05-01 (×2): qty 1

## 2013-05-01 MED ORDER — LEVOFLOXACIN 750 MG PO TABS
750.0000 mg | ORAL_TABLET | Freq: Every day | ORAL | Status: DC
  Administered 2013-05-02: 750 mg via ORAL
  Filled 2013-05-01: qty 1

## 2013-05-01 MED ORDER — ASPIRIN 81 MG PO TBEC: 81.0000 mg | DELAYED_RELEASE_TABLET | Freq: Every day | ORAL | Status: DC

## 2013-05-01 MED ORDER — FREESTYLE LANCETS MISC: Status: DC

## 2013-05-01 MED ORDER — ENALAPRIL MALEATE 2.5 MG PO TABS: 2.5000 mg | ORAL_TABLET | Freq: Two times a day (BID) | ORAL | Status: DC

## 2013-05-01 MED ORDER — LOPERAMIDE HCL 2 MG PO CAPS
2.0000 mg | ORAL_CAPSULE | Freq: Once | ORAL | Status: AC
Start: 2013-05-01 — End: 2013-05-01
  Administered 2013-05-01: 2 mg via ORAL
  Filled 2013-05-01: qty 1

## 2013-05-01 MED ORDER — FUROSEMIDE 80 MG PO TABS: 40.0000 mg | ORAL_TABLET | Freq: Three times a day (TID) | ORAL | Status: DC

## 2013-05-01 NOTE — Progress Notes (Signed)
Inpatient Diabetes Program Recommendations  AACE/ADA: New Consensus Statement on Inpatient Glycemic Control (2013)  Target Ranges:  Prepandial:   less than 140 mg/dL      Peak postprandial:   less than 180 mg/dL (1-2 hours)      Critically ill patients:  140 - 180 mg/dL   Reason for Visit:  Inpatient Diabetes Program Recommendations HgbA1C: =10.6  Note: Diabetes Coordinator spoke with patient concerning A1C=10.6.  Patient reports that she has been on Lantus insulin in the past but when she lost weight she no longer needed it.  The patient reports she has been eating more carbs recently than she should .  Encouraged patient to monitor her CBGs in the morning and at bedtime. Glucose meter RX form has been left on the chart in the [facility].   Discussed starting Metformin and Amaryl with Dr. Isidoro Donning.   Patient will follow up with her primary MD concerning DM management. Thank you  Piedad Climes BSN, RN,CDE Inpatient Diabetes Coordinator [phone] (team pager)

## 2013-05-01 NOTE — Clinical Documentation Improvement (Signed)
CHF DOCUMENTATION CLARIFICATION QUERY  THIS DOCUMENT IS NOT A PERMANENT PART OF THE MEDICAL RECORD .  Please update your documentation within the medical record to reflect your response to this query.                                                                                    05/01/13  Dear Dr. Isidoro Donning:  Noted on admit: Afib RVR, anasarca, "CHF", "pulmonary vascular congestion with heart failure", "suspect diastolic dysfunction".  Possible conditions? - Acute on Chronic Diastolic CHF - Acute pulmonary edema - Acute Cor Pulmonale - Other condition (please specify)  Clinical information: - EF: 55-60% - CXR: pulmonary vascular congestion likely pulmonary edema - PE: anasarca, Tricuspid regurge, AFib RVR on admit - Treatment: Lasix 40mg  IV q8h, ECHO, Aldactone, Cardizem  Reviewed: Dictated in the discharge summary  Thank You,  Beverley Fiedler  Clinical Documentation Specialist:  Pager  Health Information Management [place]

## 2013-05-01 NOTE — Progress Notes (Signed)
Patient ID: Tara Huff  female  JXB:[id number]    DOB: Jan 11, 1934    DOA: 04/27/2013  PCP: Allean Found, MD  Assessment/Plan: Principal Problem: Atrial fibrillation with RVR - not new -Heart rate controlled on oral Cardizem  -Patient is not on anticoagulation, reviewed prior records, patient has a history of atrial fibrillation and has not been on any anticoagulation. - on aspirin  - 2-D echo showed EF of 55-60%, ? Aortic aneurysm in the descending aorta measuring 5.46 cm in diameter. Discussed with Dr. Carolanne Grumbling, ordered CT angiogram of the chest and abdomen with aortic dissection/aneurysm protocol: No aortic aneurysm was noticed. Dilated hepatic veins and suprarenal IVC 5.6cm x 4.8cm compatible with chronic right heart failure.   Active Problems:  Pulmonary edema/moderate tricuspid regurgitation/severely dilated left atrium  - 2-D echo with EF of 55-60%, Continue Lasix and monitor I and O.'s  Diarrhea - C. difficile is negative, place on Imodium and probiotic  ESBL Escherichia coli Urinary tract infection  Placed on levofloxacin, will continue 2 weeks   Hypertension   stable  Type 2 diabetes mellitus  -Continue Lantus and moderate sliding scale  -A1c is 10.6 - Discussed with patient in detail, she reports that she has history of diabetes but was getting hypoglycemic with insulin in nursing home and was stopped. She prefers to be on oral hypoglycemic agent. Diabetic coordinator consult placed   Hyponatremia - stable  Hypoalbuminemia  -likely the cause of her anasarca in addition to heart failure  DVT Prophylaxis:  Code Status:  Disposition: Physical therapy recommending skilled nursing facility, discussed in detail with the patient she absolutely does not want skilled nursing facility. Continue medical management, arrange home health, [facility] in consult    Subjective: Denies any chest pain feels lower extremity swelling is improving. Patient is miserable with  diarrhea today  Objective: Weight change:   Intake/Output Summary (Last 24 hours) at 05/01/13 1543 Last data filed at 04/30/13 1700  Gross per 24 hour  Intake    360 ml  Output      0 ml  Net    360 ml   Blood pressure 103/66, pulse 86, temperature 98.3 F (36.8 C), temperature source Oral, resp. rate 18, height 5\' 8"  (1.727 m), weight 124.104 kg (273 lb 9.6 oz), SpO2 94.00%.  Physical Exam: General: Alert and awake, oriented x3, not in any acute distress. CVS: S1-S2 clear, no mrg Chest: CTAB, no wheezing, rales or rhonchi Abdomen: Obese, Anasarca, soft nontender,normal bowel sounds  Extremities: no c/c, 2+ edema noted bilaterally   Lab Results: Basic Metabolic Panel:  Recent Labs Lab 04/28/13 0518 04/29/13 0610 04/30/13 0516  NA 135 135 133*  K 3.5 3.6 4.0  CL 100 99 99  CO2 27 29 26   GLUCOSE 183* 138* 115*  BUN 17 18 23   CREATININE 1.01 1.06 1.06  CALCIUM 7.9* 8.0* 8.0*  MG 1.6  --   --    Liver Function Tests:  Recent Labs Lab 04/27/13 1702  AST 36  ALT 9  ALKPHOS 85  BILITOT 0.9  PROT 5.4*  ALBUMIN 1.8*   No results found for this basename: LIPASE, AMYLASE,  in the last 168 hours No results found for this basename: AMMONIA,  in the last 168 hours CBC:  Recent Labs Lab 04/27/13 2350 04/28/13 0518  WBC 5.8 4.7  NEUTROABS  --  3.3  HGB 13.4 12.7  HCT 39.3 37.7  MCV 88.9 88.7  PLT 143* 139*   Cardiac Enzymes:  Recent Labs Lab 04/27/13 2350 04/28/13 0518 04/28/13 1200  TROPONINI <0.30 <0.30 <0.30   BNP: No components found with this basename: POCBNP,  CBG:  Recent Labs Lab 04/30/13 0737 04/30/13 1157 04/30/13 1730 05/01/13 0841 05/01/13 1242  GLUCAP 121* 133* 145* 184* 119*     Micro Results: Recent Results (from the past 240 hour(s))  URINE CULTURE     Status: None   Collection Time    04/27/13  5:24 PM      Result Value Range Status   Specimen Description URINE, CATHETERIZED   Final   Special Requests NONE   Final    Culture  Setup Time 04/28/2013 02:37   Final   Colony Count >=100,000 COLONIES/ML   Final   Culture     Final   Value: ESCHERICHIA COLI     Note: Confirmed Extended Spectrum Beta-Lactamase Producer (ESBL)     Note: CRITICAL RESULT CALLED TO, READ BACK BY AND VERIFIED WITH: Westley Chandler RN on 04/30/13 at 05:50 by Christie Nottingham   Report Status 04/30/2013 FINAL   Final   Organism ID, Bacteria ESCHERICHIA COLI   Final  MRSA PCR SCREENING     Status: Abnormal   Collection Time    04/28/13  3:27 AM      Result Value Range Status   MRSA by PCR POSITIVE (*) NEGATIVE Final   Comment:            The GeneXpert MRSA Assay (FDA     approved for NASAL specimens     only), is one component of a     comprehensive MRSA colonization     surveillance program. It is not     intended to diagnose MRSA     infection nor to guide or     monitor treatment for     20     MRSA infections.     RESULT CALLED TO, READ BACK BY AND VERIFIED WITH:     TO R. ZARSONA     RESULT CALLED TO, READ BACK BY AND VERIFIED WITH:     TO R. ZARSONA BY L. LOMAX AT 0543 ON 6     2014  STOOL CULTURE     Status: None   Collection Time    05/01/13  5:25 AM      Result Value Range Status   Specimen Description STOOL   Final   Special Requests NONE   Final   Culture Culture reincubated for better growth   Final   Report Status PENDING   Incomplete  CLOSTRIDIUM DIFFICILE BY PCR     Status: None   Collection Time    05/01/13  5:25 AM      Result Value Range Status   C difficile by pcr NEGATIVE  NEGATIVE Final    Studies/Results: Dg Chest Port 1 View  04/27/2013   *RADIOLOGY REPORT*  Clinical Data: Swelling in legs, edema, cough, COPD  PORTABLE CHEST - 1 VIEW  Comparison: The multiple prior chest radiographs, most recent, 03/19/2012.  CT chest 05/27/2011  Findings: Stable cardiomegaly.  There is a prominent right paratracheal soft tissue contour, which which is likely accentuated by patient rotation to the right.  This appears new  compared to prior portable chest radiograph of 03/19/2012.  There is diffuse pulmonary vascular congestion and interstitial prominence.  No focal consolidation, visible pleural effusion, or pneumothorax. Bones unremarkable.  IMPRESSION:  1.  Cardiomegaly with pulmonary vascular congestion and probable interstitial pulmonary edema. 2.  Prominent  right paratracheal soft tissue contour, likely accentuated by patient rotation to the right.  Benign prominence related to ectatic vessels/vascular congestion is a consideration. Lymphadenopathy cannot be excluded.  Consider initial evaluation with a two-view chest radiograph.   Original Report Authenticated By: Britta Mccreedy, M.D.    Medications: Scheduled Meds: . aspirin EC  81 mg Oral Daily  . Chlorhexidine Gluconate Cloth  6 each Topical Q0600  . diltiazem  180 mg Oral Daily  . enalapril  2.5 mg Oral BID  . enoxaparin (LOVENOX) injection  40 mg Subcutaneous Q24H  . feeding supplement  237 mL Oral BID BM  . furosemide  40 mg Intravenous Q8H  . insulin aspart  0-15 Units Subcutaneous TID WC  . insulin aspart  0-5 Units Subcutaneous QHS  . insulin glargine  15 Units Subcutaneous QHS  . [START ON 05/02/2013] levofloxacin  750 mg Oral Daily  . loperamide  2 mg Oral Once  . loperamide  2 mg Oral TID  . metoprolol  25 mg Oral BID  . mupirocin ointment  1 application Nasal BID  . potassium chloride SA  20 mEq Oral BID  . saccharomyces boulardii  250 mg Oral BID  . sertraline  100 mg Oral QHS  . spironolactone  25 mg Oral Daily      LOS: 4 days   Goldye Tourangeau M.D. [facility] 05/01/2013, 3:43 PM Pager: [phone]  If 7PM-7AM, please contact night-coverage [email or web address] Password [email or web address]

## 2013-05-01 NOTE — Progress Notes (Signed)
PT Cancellation Note  Patient Details Name: Tara Huff MRN: [id number] DOB: [birth year]   Cancelled Treatment:     Pt agreed to get OOB with PT (and MD) encouragement.  I got gowned up and got room ready for her to get up and then she said she couldn't do it.  Her bottom is too sore and she cant stand getting up and sitting on EOB as lowered bed rail hits in back of her legs and it is uncomfortable.  Pt says she will be fine to return home tomorrow via ambulance and plans to have [facility] to help her to get stronger.  Nursing notified on refusal of PT services.   Wyman Songster, [place]  05/01/2013, 3:13 PM

## 2013-05-01 NOTE — Progress Notes (Signed)
PHARMACIST - PHYSICIAN COMMUNICATION DR:   Isidoro Donning CONCERNING: Antibiotic IV to Oral Route Change Policy  RECOMMENDATION: This patient is receiving levofloxacin by the intravenous route.  Based on criteria approved by the Pharmacy and Therapeutics Committee, the antibiotic(s) is/are being converted to the equivalent oral dose form(s).   DESCRIPTION: These criteria include:  Patient being treated for a respiratory tract infection, urinary tract infection, or cellulitis  The patient is not neutropenic and does not exhibit a GI malabsorption state  The patient is eating (either orally or via tube) and/or has been taking other orally administered medications for a least 24 hours  The patient is improving clinically and has a Tmax < 100.5  If you have questions about this conversion, please contact the Pharmacy Department  []   [phone] )  [facility] [x]   [phone] )  [facility]  []   [phone] )  [facility] []   [phone] )  Ilene Qua   Celedonio Miyamoto, PharmD, BCPS Clinical Pharmacist Pager [phone]

## 2013-05-02 LAB — BASIC METABOLIC PANEL
Calcium: 8.4 mg/dL (ref 8.4–10.5)
GFR calc Af Amer: 51 mL/min — ABNORMAL LOW (ref >90)
GFR calc non Af Amer: 44 mL/min — ABNORMAL LOW (ref >90)
Glucose, Bld: 114 mg/dL — ABNORMAL HIGH (ref 70–99)
Potassium: 4.2 mEq/L (ref 3.5–5.1)
Sodium: 135 mEq/L (ref 135–145)

## 2013-05-02 LAB — FECAL LACTOFERRIN, QUANT

## 2013-05-02 LAB — GLUCOSE, CAPILLARY
Glucose-Capillary: 104 mg/dL — ABNORMAL HIGH (ref 70–99)
Glucose-Capillary: 153 mg/dL — ABNORMAL HIGH (ref 70–99)

## 2013-05-02 MED ORDER — FREESTYLE LANCETS MISC: Status: DC

## 2013-05-02 MED ORDER — FUROSEMIDE 80 MG PO TABS: 40.0000 mg | ORAL_TABLET | Freq: Three times a day (TID) | ORAL | Status: DC

## 2013-05-02 MED ORDER — ENALAPRIL MALEATE 2.5 MG PO TABS: 2.5000 mg | ORAL_TABLET | Freq: Two times a day (BID) | ORAL | Status: DC

## 2013-05-02 MED ORDER — LEVOFLOXACIN 750 MG PO TABS: 750.0000 mg | ORAL_TABLET | Freq: Every day | ORAL | Status: DC

## 2013-05-02 MED ORDER — METFORMIN HCL ER 500 MG PO TB24: 500.0000 mg | ORAL_TABLET | Freq: Every day | ORAL | Status: DC

## 2013-05-02 MED ORDER — GLIMEPIRIDE 2 MG PO TABS: 2.0000 mg | ORAL_TABLET | Freq: Every day | ORAL | Status: DC

## 2013-05-02 MED ORDER — LOPERAMIDE HCL 2 MG PO CAPS: 2.0000 mg | ORAL_CAPSULE | ORAL | Status: DC | PRN

## 2013-05-02 MED ORDER — SACCHAROMYCES BOULARDII 250 MG PO CAPS: 250.0000 mg | ORAL_CAPSULE | Freq: Two times a day (BID) | ORAL | Status: DC

## 2013-05-02 NOTE — Discharge Summary (Signed)
Physician Discharge Summary  Patient ID: Tara Huff MRN: [id number] DOB/AGE: November 14, 1933 79 y.o.  Admit date: 04/27/2013 Discharge date: 05/02/2013  Primary Care Physician:  Allean Found, MD  Discharge Diagnoses:    . Atrial fibrillation with RVR . ESBL ESCHERICHIA coli Urinary tract infection . Type 2 diabetes mellitus . Hypertension . Diarrhea . Hyponatremia . Debility . Hypoalbuminemia Acute on chronic diastolic/right heart failure   Consults:  Diabetic coordinator  Recommendations for Outpatient Follow-up:  1) patient was started on metformin and Amaryl during this hospitalization, she was advised to keep a log of her blood sugars, will need adjustment of her medication doses according to the blood sugars. Hemoglobin A1c 10.6. 2) patient has atrial fibrillation which is not a new diagnosis but she's not on any anticoagulation. Will need to be addressed by PCP or cardiology, Dr Mayford Knife.   Allergies:  No Known Allergies   Discharge Medications:   Medication List    TAKE these medications       acetaminophen 500 MG tablet  Commonly known as:  TYLENOL  Take 1,000 mg by mouth 2 (two) times daily.     ALPRAZolam 0.25 MG tablet  Commonly known as:  XANAX  Take 0.25 mg by mouth at bedtime as needed for sleep. For anxiety     aspirin 81 MG EC tablet  Take 1 tablet (81 mg total) by mouth daily.     diltiazem 180 MG 24 hr capsule  Commonly known as:  CARDIZEM CD  Take 1 capsule (180 mg total) by mouth daily.     enalapril 2.5 MG tablet  Commonly known as:  VASOTEC  Take 1 tablet (2.5 mg total) by mouth 2 (two) times daily.     freestyle lancets  Use as instructed     furosemide 80 MG tablet  Commonly known as:  LASIX  Take 0.5 tablets (40 mg total) by mouth 3 (three) times daily.     glimepiride 2 MG tablet  Commonly known as:  AMARYL  Take 1 tablet (2 mg total) by mouth daily before breakfast.     levofloxacin 750 MG tablet  Commonly known as:   LEVAQUIN  Take 1 tablet (750 mg total) by mouth daily. X 12 days     loperamide 2 MG capsule  Commonly known as:  IMODIUM  Take 1 capsule (2 mg total) by mouth as needed for diarrhea or loose stools. Also available over the counter     metFORMIN 500 MG 24 hr tablet  Commonly known as:  GLUCOPHAGE XR  Take 1 tablet (500 mg total) by mouth daily with supper.     metoprolol 50 MG tablet  Commonly known as:  LOPRESSOR  Take 25 mg by mouth 2 (two) times daily.     potassium chloride SA 20 MEQ tablet  Commonly known as:  K-DUR,KLOR-CON  Take 20 mEq by mouth 2 (two) times daily.     saccharomyces boulardii 250 MG capsule  Commonly known as:  FLORASTOR  Take 1 capsule (250 mg total) by mouth 2 (two) times daily.     sertraline 100 MG tablet  Commonly known as:  ZOLOFT  Take 100 mg by mouth at bedtime.     spironolactone 25 MG tablet  Commonly known as:  ALDACTONE  Take 25 mg by mouth daily.     Vitamin D 1000 UNITS capsule  Take 1,000 Units by mouth daily.         Brief H and P: For complete  details please refer to admission H and P, but in brief Tara Huff is a 79 y.o. female  has a past medical history of Hypertension; Diabetes mellitus; Atrial fibrillation; MI (myocardial infarction); Anxiety; Hypercholesterolemia; Shoulder pain; Sleep apnea; Type 2 diabetes mellitus; OSA (obstructive sleep apnea); Hypertensive heart disease; Morbid obesity; Pyogenic granuloma; and Cancer.  Presented with Hx of diarrhea for 3 days that is now getting better. She has been retaining fluids in her legs for the past few weeks.  She started to have weeping areas from her legs where she got scratched by the cats. Denies any shortness of breath or chest pain. Subjective fever for the past few days. Denied any sick contacts. Patient felt so fatigued she could not get to the office to be evaluated so her PCP told her to call 911. She has had decreased PO intake for the past few days. Patient was  noted to be in A.fib with RVR and slightly hypotensive but also with evidence of peripheral edema. ER MD gave her Lasix 80 IV and hospitalist was called for an admission.    Hospital Course:  Atrial fibrillation with RVR - not new, Heart rate controlled on oral Cardizem. Patient is not on anticoagulation, reviewed prior records, patient has a history of atrial fibrillation and has not been on any anticoagulation. Placed on aspirin here, anticoagulation will need to be discussed and managed by primary care physician or patient's cardiologist, Dr. Mayford Knife Continue Cardizem, beta blocker, aspirin.  Acute on chronic diastolic/right heart failure with Pulmonary edema/moderate tricuspid regurgitation/severely dilated right and left atrium.  2-D echo showed EF of 55-60%, ? Aortic aneurysm in the descending aorta measuring 5.46 cm in diameter. Discussed with Dr. Carolanne Grumbling, ordered CT angiogram of the chest and abdomen with aortic dissection/aneurysm protocol: No aortic aneurysm was noticed. Dilated hepatic veins and suprarenal IVC 5.6cm x 4.8cm compatible with chronic right heart failure.  - Patient did well with IV diuresis, weight down from 283 lbs to 260 lbs at discharge.  Home dose of Lasix increased to 40 mg 3 times a day. Continue spironolactone, further adjustments to be made by Dr. Mayford Knife.  Diarrhea - RESOLVED, C. difficile is negative, placed on Imodium as needed and probiotic.   ESBL Escherichia coli Urinary tract infection: Patient was initially placed on IV Rocephin however per sensitivities placed on levofloxacin, will continue 12 more days to complete course for 2 weeks.    Hypertension  stable   Type 2 diabetes mellitus  -Patient was placed on Lantus and moderate sliding scale while inpatient, A1c is 10.6  - Discussed with patient in detail, she reports that she has history of diabetes but was getting hypoglycemic with insulin in nursing home and was stopped. She prefers to be on oral  hypoglycemic agent. Diabetic coordinator consult placed patient was given diabetic teaching and placed on Amaryl and metformin. The dose will need to be adjusted by primary care physician upon the followup appointment.    Hyponatremia - stable   Hypoalbuminemia  -likely the cause of her anasarca in addition to heart failure   Disposition: Physical therapy recommending skilled nursing facility, discussed in detail with the patient she absolutely does not want skilled nursing facility. Home health was arranged, [facility] consult was also placed however patient feels that she can manage at home.  Day of Discharge BP 113/54  Pulse 108  Temp(Src) 98.1 F (36.7 C) (Oral)  Resp 18  Ht 5\' 8"  (1.727 m)  Wt 118.298 kg (260  lb 12.8 oz)  BMI 39.66 kg/m2  SpO2 91%  Physical Exam:  General: Alert and awake, oriented x3, not in any acute distress.  CVS: S1-S2 clear, no mrg  Chest: CTAB, no wheezing, rales or rhonchi  Abdomen: Obese, Anasarca, soft nontender,normal bowel sounds  Extremities: no c/c, 2+ edema noted bilaterally with chronic venous stasis changes, excoriated skin     The results of significant diagnostics from this hospitalization (including imaging, microbiology, ancillary and laboratory) are listed below for reference.    LAB RESULTS: Basic Metabolic Panel:  Recent Labs Lab 04/28/13 0518  04/30/13 0516 05/02/13 0420  NA 135  < > 133* 135  K 3.5  < > 4.0 4.2  CL 100  < > 99 98  CO2 27  < > 26 29  GLUCOSE 183*  < > 115* 114*  BUN 17  < > 23 23  CREATININE 1.01  < > 1.06 1.18*  CALCIUM 7.9*  < > 8.0* 8.4  MG 1.6  --   --   --   < > = values in this interval not displayed. Liver Function Tests:  Recent Labs Lab 04/27/13 1702  AST 36  ALT 9  ALKPHOS 85  BILITOT 0.9  PROT 5.4*  ALBUMIN 1.8*   No results found for this basename: LIPASE, AMYLASE,  in the last 168 hours No results found for this basename: AMMONIA,  in the last 168 hours CBC:  Recent Labs Lab  04/27/13 2350 04/28/13 0518  WBC 5.8 4.7  NEUTROABS  --  3.3  HGB 13.4 12.7  HCT 39.3 37.7  MCV 88.9 88.7  PLT 143* 139*   Cardiac Enzymes:  Recent Labs Lab 04/28/13 0518 04/28/13 1200  TROPONINI <0.30 <0.30   BNP: No components found with this basename: POCBNP,  CBG:  Recent Labs Lab 05/01/13 2254 05/02/13 0802  GLUCAP 133* 104*    Significant Diagnostic Studies:  Dg Chest Port 1 View  04/27/2013   *RADIOLOGY REPORT*  Clinical Data: Swelling in legs, edema, cough, COPD  PORTABLE CHEST - 1 VIEW  Comparison: The multiple prior chest radiographs, most recent, 03/19/2012.  CT chest 05/27/2011  Findings: Stable cardiomegaly.  There is a prominent right paratracheal soft tissue contour, which which is likely accentuated by patient rotation to the right.  This appears new compared to prior portable chest radiograph of 03/19/2012.  There is diffuse pulmonary vascular congestion and interstitial prominence.  No focal consolidation, visible pleural effusion, or pneumothorax. Bones unremarkable.  IMPRESSION:  1.  Cardiomegaly with pulmonary vascular congestion and probable interstitial pulmonary edema. 2.  Prominent right paratracheal soft tissue contour, likely accentuated by patient rotation to the right.  Benign prominence related to ectatic vessels/vascular congestion is a consideration. Lymphadenopathy cannot be excluded.  Consider initial evaluation with a two-view chest radiograph.   Original Report Authenticated By: Britta Mccreedy, M.D.    2D ECHO: Study Conclusions  - Left ventricle: The cavity size was normal. There was mild concentric hypertrophy. Systolic function was normal. The estimated ejection fraction was in the range of 55% to 60%. - Aorta: There is evidence of an aortic aneurysm in the descending aorta measureing 5.46cm in diameter. - Mitral valve: Mild regurgitation. - Left atrium: The atrium was moderately dilated. - Right ventricle: The cavity size was mildly  dilated. Wall thickness was normal. - Right atrium: The atrium was severely dilated. - Tricuspid valve: Moderate regurgitation. - Pulmonary arteries: PA peak pressure: 39mm Hg (S).  CT angiogram of the chest  and abdomen/pelvis IMPRESSION:  Thoracic aortic atherosclerosis without aneurysm or dissection.  Dilated hepatic veins and suprarenal IVC as above compatible with  chronic right heart failure.  Small right pleural effusion and basilar atelectasis  Negative for abdominal aortic aneurysm or dissection. No acute  retroperitoneal hemorrhage.  Dilated hepatic veins, IVC and renal veins compatible with chronic  right side heart failure  Calcified gallstones  Chronic renal atrophy  Diffuse anasarca  Prior hysterectomy  Left inguinal adenopathy. Lymphoproliferative process not entirely  excluded.  Disposition and Follow-up: Discharge Orders   Future Orders Complete By Expires     (HEART FAILURE PATIENTS) Call MD:  Anytime you have any of the following symptoms: 1) 3 pound weight gain in 24 hours or 5 pounds in 1 week 2) shortness of breath, with or without a dry hacking cough 3) swelling in the hands, feet or stomach 4) if you have to sleep on extra pillows at night in order to breathe.  As directed     Diet Carb Modified  As directed     Discharge instructions  As directed     Comments:      Please limit your fluid intake, continue fluid restriction 1500 cc in 24 hours and low-sodium diet.    Increase activity slowly  As directed         DISPOSITION: Home DIET: Carb modified diet, low-sodium  ACTIVITY: As tolerated   DISCHARGE FOLLOW-UP Follow-up Information   Follow up with Allean Found, MD. Schedule an appointment as soon as possible for a visit in 2 weeks. (Please take the log off your blood sugars to adjust medications)    Contact information:   [address] [place] [address] [phone]       Follow up with Quintella Reichert, MD. Schedule an  appointment as soon as possible for a visit in 2 weeks.   Contact information:   [address] [place] [address] [phone]       Time spent on Discharge: 45 mins  Signed:   Jaz Mallick M.D. [facility] 05/02/2013, 12:12 PM Pager: [phone]

## 2013-05-02 NOTE — Progress Notes (Signed)
Clinical Child psychotherapist (CSW) informed that pt is ready for dc home and in need of transportation. CSW confirmed pt address and provided transportation resources in pt dc packet. No additional CSW needs, Sharin Mons has been contacted, CSW signing off.  Theresia Bough, MSW, Theresia Majors [phone]

## 2013-05-02 NOTE — Progress Notes (Signed)
Occupational Therapy Treatment Patient Details Name: Tara Huff MRN: [id number] DOB: 1934/02/29 Today's Date: 05/02/2013 Time: 1330-1340 OT Time Calculation (min): 10 min  OT Assessment / Plan / Recommendation Comments on Treatment Session Pt continues to present wtih weakness and requiring A for basic ADLs; however pt does think she can be independent at home even through with dressing to go home pt requires A and A to stand. Would recommend f/u OT to further progress her endurance and over strength for ADL performance    Follow Up Recommendations  Home health OT;Supervision/Assistance - 24 hour    Barriers to Discharge       Equipment Recommendations  3 in 1 bedside comode    Recommendations for Other Services    Frequency     Plan      Precautions / Restrictions Precautions Precautions: Fall Restrictions Weight Bearing Restrictions: No   Pertinent Vitals/Pain No c/o pain     ADL  Grooming: Performed;Brushing hair;Set up Where Assessed - Grooming: Supported sitting Upper Body Dressing: Performed;Minimal assistance Where Assessed - Upper Body Dressing: Supported standing Lower Body Dressing: Performed;Maximal assistance Toilet Transfer: Simulated;Moderate assistance Toilet Transfer Method: Stand pivot ADL Comments: Pt requires A for sit to stands with proper instruction on proper hand placement and for forward weight shift. Pt plans to meet her husband at home but unsure how she will get home today. Pt reports feeling weak but in the past days she has refused any therapy treatment to focus on functional mobility, endurance and weakness. Pt overestimates her abilities to be indepdent at home. Pt reported she could not don her bra but if she were at home she would be able to. Pt does present with weakness and would benefit from continued services.    OT Diagnosis:    OT Problem List:   OT Treatment Interventions:     OT Goals ADL Goals ADL Goal: Lower Body Dressing -  Progress: Progressing toward goals ADL Goal: Toilet Transfer - Progress: Progressing toward goals ADL Goal: Toileting - Clothing Manipulation - Progress: Progressing toward goals ADL Goal: Toileting - Hygiene - Progress: Progressing toward goals Miscellaneous OT Goals OT Goal: Miscellaneous Goal #1 - Progress: Met  Visit Information  Last OT Received On: 05/02/13 Assistance Needed: +1    Subjective Data  Subjective: Pt admit about going home today and wanting to take off leads.  Patient Stated Goal: to go home.   Prior Functioning       Cognition  Cognition Arousal/Alertness: Awake/alert Behavior During Therapy: WFL for tasks assessed/performed Overall Cognitive Status: Within Functional Limits for tasks assessed    Mobility  Bed Mobility Supine to Sit: 5: Supervision Details for Bed Mobility Assistance: Pt did need the rail to help herself to the EOB. Pt does require extra time to perform tasks Transfers Transfers: Stand to Sit;Sit to Stand Sit to Stand: 3: Mod assist Stand to Sit: 4: Min assist Details for Transfer Assistance: Pt requires extra time and tactile cues to maintain a forward posture. Once standing from bed pt able to pivot wtih RW to chair with min to have a controlled decent.    Exercises      Balance Static Standing Balance Static Standing - Balance Support: Bilateral upper extremity supported Static Standing - Level of Assistance: 4: Min assist   End of Session OT - End of Session Activity Tolerance: Patient limited by fatigue Patient left: with call bell/phone within reach;in chair  GO     Roney Mans [facility] 05/02/2013,  2:05 PM

## 2013-05-02 NOTE — Progress Notes (Signed)
Patient evaluated for community based chronic disease management services with [facility] Care Management Program as a benefit of patient's [employer].  Spoke with patient at bedside to explain [facility] Care Management services. Her husband takes care of her and she does not feel that she is struggling with her management at this time.  Patient has been started on Glucerna.  She indicated that she likes it and plans to continue.  Informed patient that our services could provide this supplement at a discounted price.  She indicated that she would like to see how things go at home before signing up with [facility] services.  Left contact information and [facility] literature at bedside. Made inpatient Case Manager Tomi Bamberger RN aware that [facility] Care Management has consulted. Of note, [facility] Care Management services does not replace or interfere with any services that are arranged by inpatient case management or social work.  For additional questions or referrals please contact Anibal Henderson BSN RN [facility] Liaison at [phone].

## 2013-05-05 LAB — STOOL CULTURE

## 2013-09-04 ENCOUNTER — Other Ambulatory Visit (HOSPITAL_COMMUNITY): Payer: Self-pay | Admitting: Cardiology

## 2013-09-13 ENCOUNTER — Other Ambulatory Visit (HOSPITAL_COMMUNITY): Payer: Self-pay | Admitting: Cardiology

## 2013-10-08 ENCOUNTER — Other Ambulatory Visit (HOSPITAL_COMMUNITY): Payer: Self-pay | Admitting: Cardiology

## 2013-11-14 ENCOUNTER — Inpatient Hospital Stay (HOSPITAL_COMMUNITY): Payer: Medicare Other

## 2013-11-14 ENCOUNTER — Encounter (HOSPITAL_COMMUNITY)
Admission: EM | Disposition: A | Discharge status: Skilled Nursing Facility | Payer: Self-pay | Source: Home / Self Care | Attending: Pulmonary Disease

## 2013-11-14 ENCOUNTER — Encounter (HOSPITAL_COMMUNITY): Payer: Self-pay | Admitting: Emergency Medicine

## 2013-11-14 ENCOUNTER — Inpatient Hospital Stay (HOSPITAL_COMMUNITY)
Admission: EM | Admit: 2013-11-14 | Discharge: 2013-11-24 | DRG: 871 | Disposition: A | Discharge status: Skilled Nursing Facility | Payer: Medicare Other | Attending: Internal Medicine | Admitting: Internal Medicine

## 2013-11-14 DIAGNOSIS — I5022 Chronic systolic (congestive) heart failure: Secondary | ICD-10-CM | POA: Diagnosis present

## 2013-11-14 DIAGNOSIS — N390 Urinary tract infection, site not specified: Secondary | ICD-10-CM | POA: Diagnosis present

## 2013-11-14 DIAGNOSIS — M869 Osteomyelitis, unspecified: Secondary | ICD-10-CM

## 2013-11-14 DIAGNOSIS — I119 Hypertensive heart disease without heart failure: Secondary | ICD-10-CM

## 2013-11-14 DIAGNOSIS — G9340 Encephalopathy, unspecified: Secondary | ICD-10-CM | POA: Diagnosis present

## 2013-11-14 DIAGNOSIS — F411 Generalized anxiety disorder: Secondary | ICD-10-CM

## 2013-11-14 DIAGNOSIS — N179 Acute kidney failure, unspecified: Secondary | ICD-10-CM | POA: Diagnosis present

## 2013-11-14 DIAGNOSIS — F3289 Other specified depressive episodes: Secondary | ICD-10-CM | POA: Diagnosis present

## 2013-11-14 DIAGNOSIS — L89309 Pressure ulcer of unspecified buttock, unspecified stage: Secondary | ICD-10-CM | POA: Diagnosis present

## 2013-11-14 DIAGNOSIS — G4733 Obstructive sleep apnea (adult) (pediatric): Secondary | ICD-10-CM | POA: Diagnosis present

## 2013-11-14 DIAGNOSIS — N183 Chronic kidney disease, stage 3 unspecified: Secondary | ICD-10-CM | POA: Diagnosis present

## 2013-11-14 DIAGNOSIS — Z6838 Body mass index (BMI) 38.0-38.9, adult: Secondary | ICD-10-CM

## 2013-11-14 DIAGNOSIS — L97509 Non-pressure chronic ulcer of other part of unspecified foot with unspecified severity: Secondary | ICD-10-CM | POA: Diagnosis present

## 2013-11-14 DIAGNOSIS — D696 Thrombocytopenia, unspecified: Secondary | ICD-10-CM | POA: Diagnosis present

## 2013-11-14 DIAGNOSIS — I442 Atrioventricular block, complete: Secondary | ICD-10-CM

## 2013-11-14 DIAGNOSIS — D649 Anemia, unspecified: Secondary | ICD-10-CM | POA: Diagnosis present

## 2013-11-14 DIAGNOSIS — R6521 Severe sepsis with septic shock: Secondary | ICD-10-CM

## 2013-11-14 DIAGNOSIS — A419 Sepsis, unspecified organism: Principal | ICD-10-CM | POA: Diagnosis present

## 2013-11-14 DIAGNOSIS — Z66 Do not resuscitate: Secondary | ICD-10-CM | POA: Diagnosis present

## 2013-11-14 DIAGNOSIS — Z800 Family history of malignant neoplasm of digestive organs: Secondary | ICD-10-CM

## 2013-11-14 DIAGNOSIS — I4891 Unspecified atrial fibrillation: Secondary | ICD-10-CM | POA: Diagnosis present

## 2013-11-14 DIAGNOSIS — E785 Hyperlipidemia, unspecified: Secondary | ICD-10-CM | POA: Diagnosis present

## 2013-11-14 DIAGNOSIS — G4730 Sleep apnea, unspecified: Secondary | ICD-10-CM | POA: Diagnosis present

## 2013-11-14 DIAGNOSIS — Z22322 Carrier or suspected carrier of Methicillin resistant Staphylococcus aureus: Secondary | ICD-10-CM

## 2013-11-14 DIAGNOSIS — A498 Other bacterial infections of unspecified site: Secondary | ICD-10-CM | POA: Diagnosis present

## 2013-11-14 DIAGNOSIS — Z79899 Other long term (current) drug therapy: Secondary | ICD-10-CM

## 2013-11-14 DIAGNOSIS — E1169 Type 2 diabetes mellitus with other specified complication: Secondary | ICD-10-CM | POA: Diagnosis present

## 2013-11-14 DIAGNOSIS — Z7982 Long term (current) use of aspirin: Secondary | ICD-10-CM

## 2013-11-14 DIAGNOSIS — I509 Heart failure, unspecified: Secondary | ICD-10-CM | POA: Diagnosis present

## 2013-11-14 DIAGNOSIS — R197 Diarrhea, unspecified: Secondary | ICD-10-CM

## 2013-11-14 DIAGNOSIS — E662 Morbid (severe) obesity with alveolar hypoventilation: Secondary | ICD-10-CM | POA: Diagnosis present

## 2013-11-14 DIAGNOSIS — I10 Essential (primary) hypertension: Secondary | ICD-10-CM

## 2013-11-14 DIAGNOSIS — E119 Type 2 diabetes mellitus without complications: Secondary | ICD-10-CM

## 2013-11-14 DIAGNOSIS — E871 Hypo-osmolality and hyponatremia: Secondary | ICD-10-CM | POA: Diagnosis not present

## 2013-11-14 DIAGNOSIS — I110 Hypertensive heart disease with heart failure: Secondary | ICD-10-CM | POA: Diagnosis present

## 2013-11-14 DIAGNOSIS — S98139A Complete traumatic amputation of one unspecified lesser toe, initial encounter: Secondary | ICD-10-CM

## 2013-11-14 DIAGNOSIS — I252 Old myocardial infarction: Secondary | ICD-10-CM

## 2013-11-14 DIAGNOSIS — N170 Acute kidney failure with tubular necrosis: Secondary | ICD-10-CM | POA: Diagnosis present

## 2013-11-14 DIAGNOSIS — R001 Bradycardia, unspecified: Secondary | ICD-10-CM

## 2013-11-14 DIAGNOSIS — R6520 Severe sepsis without septic shock: Secondary | ICD-10-CM

## 2013-11-14 DIAGNOSIS — F329 Major depressive disorder, single episode, unspecified: Secondary | ICD-10-CM | POA: Diagnosis present

## 2013-11-14 DIAGNOSIS — B372 Candidiasis of skin and nail: Secondary | ICD-10-CM | POA: Diagnosis present

## 2013-11-14 DIAGNOSIS — E872 Acidosis, unspecified: Secondary | ICD-10-CM | POA: Diagnosis present

## 2013-11-14 DIAGNOSIS — L03119 Cellulitis of unspecified part of limb: Secondary | ICD-10-CM

## 2013-11-14 DIAGNOSIS — Z789 Other specified health status: Secondary | ICD-10-CM

## 2013-11-14 DIAGNOSIS — L8990 Pressure ulcer of unspecified site, unspecified stage: Secondary | ICD-10-CM | POA: Diagnosis present

## 2013-11-14 DIAGNOSIS — I498 Other specified cardiac arrhythmias: Secondary | ICD-10-CM

## 2013-11-14 DIAGNOSIS — I2789 Other specified pulmonary heart diseases: Secondary | ICD-10-CM | POA: Diagnosis present

## 2013-11-14 DIAGNOSIS — Z8542 Personal history of malignant neoplasm of other parts of uterus: Secondary | ICD-10-CM

## 2013-11-14 DIAGNOSIS — T68XXXA Hypothermia, initial encounter: Secondary | ICD-10-CM

## 2013-11-14 DIAGNOSIS — C541 Malignant neoplasm of endometrium: Secondary | ICD-10-CM

## 2013-11-14 DIAGNOSIS — R5381 Other malaise: Secondary | ICD-10-CM | POA: Diagnosis present

## 2013-11-14 DIAGNOSIS — L02419 Cutaneous abscess of limb, unspecified: Secondary | ICD-10-CM | POA: Diagnosis present

## 2013-11-14 DIAGNOSIS — E7800 Pure hypercholesterolemia, unspecified: Secondary | ICD-10-CM | POA: Diagnosis present

## 2013-11-14 DIAGNOSIS — I469 Cardiac arrest, cause unspecified: Secondary | ICD-10-CM

## 2013-11-14 DIAGNOSIS — I219 Acute myocardial infarction, unspecified: Secondary | ICD-10-CM | POA: Diagnosis present

## 2013-11-14 DIAGNOSIS — E875 Hyperkalemia: Secondary | ICD-10-CM | POA: Diagnosis not present

## 2013-11-14 DIAGNOSIS — F0390 Unspecified dementia without behavioral disturbance: Secondary | ICD-10-CM | POA: Diagnosis present

## 2013-11-14 DIAGNOSIS — I872 Venous insufficiency (chronic) (peripheral): Secondary | ICD-10-CM | POA: Diagnosis present

## 2013-11-14 DIAGNOSIS — Z993 Dependence on wheelchair: Secondary | ICD-10-CM

## 2013-11-14 DIAGNOSIS — I369 Nonrheumatic tricuspid valve disorder, unspecified: Secondary | ICD-10-CM

## 2013-11-14 DIAGNOSIS — Z87891 Personal history of nicotine dependence: Secondary | ICD-10-CM

## 2013-11-14 HISTORY — DX: Atrioventricular block, complete: I44.2

## 2013-11-14 HISTORY — PX: TEMPORARY PACEMAKER INSERTION: SHX5471

## 2013-11-14 HISTORY — DX: Cardiac arrest, cause unspecified: I46.9

## 2013-11-14 LAB — GLUCOSE, CAPILLARY
GLUCOSE-CAPILLARY: 194 mg/dL — AB (ref 70–99)
Glucose-Capillary: 76 mg/dL (ref 70–99)
Glucose-Capillary: 51 mg/dL — ABNORMAL LOW (ref 70–99)
Glucose-Capillary: 95 mg/dL (ref 70–99)

## 2013-11-14 LAB — MRSA PCR SCREENING: MRSA BY PCR: POSITIVE — AB

## 2013-11-14 LAB — CBC WITH DIFFERENTIAL/PLATELET
BASOS ABS: 0 10*3/uL (ref 0.0–0.1)
Basophils Relative: 0 % (ref 0–1)
Eosinophils Absolute: 0 10*3/uL (ref 0.0–0.7)
Eosinophils Relative: 1 % (ref 0–5)
HEMATOCRIT: 17 % — AB (ref 36.0–46.0)
HEMOGLOBIN: 5.9 g/dL — AB (ref 12.0–15.0)
LYMPHS PCT: 23 % (ref 12–46)
Lymphs Abs: 0.9 10*3/uL (ref 0.7–4.0)
MCH: 31.7 pg (ref 26.0–34.0)
MCHC: 34.7 g/dL (ref 30.0–36.0)
MCV: 91.4 fL (ref 78.0–100.0)
MONO ABS: 0.4 10*3/uL (ref 0.1–1.0)
MONOS PCT: 10 % (ref 3–12)
NEUTROS ABS: 2.6 10*3/uL (ref 1.7–7.7)
Neutrophils Relative %: 65 % (ref 43–77)
Platelets: 50 10*3/uL — ABNORMAL LOW (ref 150–400)
RBC: 1.86 MIL/uL — ABNORMAL LOW (ref 3.87–5.11)
RDW: 16.9 % — AB (ref 11.5–15.5)
WBC: 3.9 10*3/uL — AB (ref 4.0–10.5)

## 2013-11-14 LAB — POCT I-STAT 3, ART BLOOD GAS (G3+)
Acid-base deficit: 20 mmol/L — ABNORMAL HIGH (ref 0.0–2.0)
BICARBONATE: 8.9 meq/L — AB (ref 20.0–24.0)
O2 Saturation: 98 %
PO2 ART: 145 mmHg — AB (ref 80.0–100.0)
TCO2: 10 mmol/L (ref 0–100)
pCO2 arterial: 29 mmHg — ABNORMAL LOW (ref 35.0–45.0)
pH, Arterial: 7.094 — CL (ref 7.350–7.450)

## 2013-11-14 LAB — CBC
HCT: 36.8 % (ref 36.0–46.0)
HEMATOCRIT: 34.4 % — AB (ref 36.0–46.0)
HEMOGLOBIN: 12.3 g/dL (ref 12.0–15.0)
Hemoglobin: 13 g/dL (ref 12.0–15.0)
MCH: 31.9 pg (ref 26.0–34.0)
MCH: 31.9 pg (ref 26.0–34.0)
MCHC: 35.3 g/dL (ref 30.0–36.0)
MCHC: 35.8 g/dL (ref 30.0–36.0)
MCV: 90.4 fL (ref 78.0–100.0)
MCV: 89.4 fL (ref 78.0–100.0)
PLATELETS: 90 10*3/uL — AB (ref 150–400)
Platelets: UNDETERMINED 10*3/uL (ref 150–400)
RBC: 4.07 MIL/uL (ref 3.87–5.11)
RBC: 3.85 MIL/uL — AB (ref 3.87–5.11)
RDW: 16.9 % — AB (ref 11.5–15.5)
RDW: 16.8 % — ABNORMAL HIGH (ref 11.5–15.5)
WBC: 13 10*3/uL — ABNORMAL HIGH (ref 4.0–10.5)
WBC: 11 10*3/uL — ABNORMAL HIGH (ref 4.0–10.5)

## 2013-11-14 LAB — POCT I-STAT TROPONIN I: Troponin i, poc: 0 ng/mL (ref 0.00–0.08)

## 2013-11-14 LAB — POCT I-STAT, CHEM 8
BUN: 57 mg/dL — AB (ref 6–23)
CALCIUM ION: 1.26 mmol/L (ref 1.13–1.30)
CHLORIDE: 113 meq/L — AB (ref 96–112)
Creatinine, Ser: 2.5 mg/dL — ABNORMAL HIGH (ref 0.50–1.10)
Glucose, Bld: 286 mg/dL — ABNORMAL HIGH (ref 70–99)
HEMATOCRIT: 44 % (ref 36.0–46.0)
Hemoglobin: 15 g/dL (ref 12.0–15.0)
POTASSIUM: 5.3 meq/L (ref 3.7–5.3)
Sodium: 135 mEq/L — ABNORMAL LOW (ref 137–147)
TCO2: 10 mmol/L (ref 0–100)

## 2013-11-14 LAB — BASIC METABOLIC PANEL
BUN: 61 mg/dL — ABNORMAL HIGH (ref 6–23)
CHLORIDE: 105 meq/L (ref 96–112)
CO2: 16 mEq/L — ABNORMAL LOW (ref 19–32)
Calcium: 8.1 mg/dL — ABNORMAL LOW (ref 8.4–10.5)
Creatinine, Ser: 2.4 mg/dL — ABNORMAL HIGH (ref 0.50–1.10)
GFR calc Af Amer: 21 mL/min — ABNORMAL LOW (ref >90)
GFR calc non Af Amer: 18 mL/min — ABNORMAL LOW (ref >90)
Glucose, Bld: 44 mg/dL — CL (ref 70–99)
POTASSIUM: 5.3 meq/L (ref 3.7–5.3)
Sodium: 136 mEq/L — ABNORMAL LOW (ref 137–147)

## 2013-11-14 LAB — CREATININE, SERUM
Creatinine, Ser: 2.44 mg/dL — ABNORMAL HIGH (ref 0.50–1.10)
GFR calc Af Amer: 21 mL/min — ABNORMAL LOW (ref >90)
GFR, EST NON AFRICAN AMERICAN: 18 mL/min — AB (ref >90)

## 2013-11-14 LAB — PRO B NATRIURETIC PEPTIDE: PRO B NATRI PEPTIDE: 1616 pg/mL — AB (ref 0–450)

## 2013-11-14 LAB — TROPONIN I
Troponin I: <0.3 ng/mL (ref <0.30)
Troponin I: <0.3 ng/mL (ref <0.30)

## 2013-11-14 LAB — KETONES, QUALITATIVE: ACETONE BLD: NEGATIVE

## 2013-11-14 LAB — CORTISOL: Cortisol, Plasma: 23 ug/dL

## 2013-11-14 LAB — PROTIME-INR
INR: 1.34 (ref 0.00–1.49)
PROTHROMBIN TIME: 16.3 s — AB (ref 11.6–15.2)

## 2013-11-14 LAB — PREPARE RBC (CROSSMATCH)

## 2013-11-14 LAB — PROCALCITONIN: Procalcitonin: 0.24 ng/mL

## 2013-11-14 LAB — LACTIC ACID, PLASMA: Lactic Acid, Venous: 2.1 mmol/L (ref 0.5–2.2)

## 2013-11-14 SURGERY — TEMPORARY PACEMAKER INSERTION
Anesthesia: LOCAL

## 2013-11-14 MED ORDER — HEPARIN SODIUM (PORCINE) 5000 UNIT/ML IJ SOLN: 5000.0000 [IU] | Freq: Three times a day (TID) | INTRAMUSCULAR | Status: DC

## 2013-11-14 MED ORDER — INSULIN ASPART 100 UNIT/ML ~~LOC~~ SOLN
2.0000 [IU] | SUBCUTANEOUS | Status: DC
  Administered 2013-11-14 – 2013-11-17 (×2): 4 [IU] via SUBCUTANEOUS
  Administered 2013-11-18: 2 [IU] via SUBCUTANEOUS
  Administered 2013-11-19: 4 [IU] via SUBCUTANEOUS
  Filled 2013-11-14: qty 0.06

## 2013-11-14 MED ORDER — SODIUM BICARBONATE 8.4 % IV SOLN
INTRAVENOUS | Status: DC
  Administered 2013-11-14 (×2): via INTRAVENOUS
  Filled 2013-11-14 (×5): qty 150

## 2013-11-14 MED ORDER — MUPIROCIN 2 % EX OINT
1.0000 "application " | TOPICAL_OINTMENT | Freq: Two times a day (BID) | CUTANEOUS | Status: AC
  Administered 2013-11-14 – 2013-11-19 (×10): 1 via NASAL
  Filled 2013-11-14: qty 22

## 2013-11-14 MED ORDER — CHLORHEXIDINE GLUCONATE CLOTH 2 % EX PADS
6.0000 | MEDICATED_PAD | Freq: Every day | CUTANEOUS | Status: AC
  Administered 2013-11-15 – 2013-11-19 (×5): 6 via TOPICAL

## 2013-11-14 MED ORDER — VANCOMYCIN HCL 10 G IV SOLR
1500.0000 mg | INTRAVENOUS | Status: DC
  Administered 2013-11-16 – 2013-11-18 (×2): 1500 mg via INTRAVENOUS
  Filled 2013-11-14 (×4): qty 1500

## 2013-11-14 MED ORDER — NOREPINEPHRINE BITARTRATE 1 MG/ML IJ SOLN
2.0000 ug/min | INTRAVENOUS | Status: DC
  Administered 2013-11-14: 8 ug/min via INTRAVENOUS
  Filled 2013-11-14 (×2): qty 8

## 2013-11-14 MED ORDER — NOREPINEPHRINE BITARTRATE 1 MG/ML IJ SOLN
2.0000 ug/min | Freq: Once | INTRAVENOUS | Status: AC
  Administered 2013-11-14: 8 ug/min via INTRAVENOUS
  Filled 2013-11-14: qty 4

## 2013-11-14 MED ORDER — PIPERACILLIN-TAZOBACTAM 3.375 G IVPB 30 MIN
3.3750 g | INTRAVENOUS | Status: AC
  Administered 2013-11-14: 3.375 g via INTRAVENOUS
  Filled 2013-11-14: qty 50

## 2013-11-14 MED ORDER — SODIUM CHLORIDE 0.9 % IV SOLN: INTRAVENOUS | Status: DC

## 2013-11-14 MED ORDER — FLUCONAZOLE IN SODIUM CHLORIDE 400-0.9 MG/200ML-% IV SOLN
400.0000 mg | INTRAVENOUS | Status: AC
  Administered 2013-11-14: 400 mg via INTRAVENOUS
  Filled 2013-11-14: qty 200

## 2013-11-14 MED ORDER — PIPERACILLIN-TAZOBACTAM 3.375 G IVPB
3.3750 g | Freq: Three times a day (TID) | INTRAVENOUS | Status: DC
  Administered 2013-11-14 – 2013-11-20 (×18): 3.375 g via INTRAVENOUS
  Filled 2013-11-14 (×24): qty 50

## 2013-11-14 MED ORDER — SODIUM CHLORIDE 0.9 % IV BOLUS (SEPSIS)
750.0000 mL | Freq: Once | INTRAVENOUS | Status: AC
  Administered 2013-11-14: 750 mL via INTRAVENOUS

## 2013-11-14 MED ORDER — SODIUM CHLORIDE 0.9 % IV SOLN
250.0000 mL | INTRAVENOUS | Status: DC | PRN
  Administered 2013-11-18: 250 mL via INTRAVENOUS

## 2013-11-14 MED ORDER — DEXTROSE 50 % IV SOLN
25.0000 mL | Freq: Once | INTRAVENOUS | Status: AC | PRN
  Administered 2013-11-14: 25 mL via INTRAVENOUS
  Filled 2013-11-14: qty 50

## 2013-11-14 MED ORDER — PANTOPRAZOLE SODIUM 40 MG IV SOLR
40.0000 mg | INTRAVENOUS | Status: DC
  Administered 2013-11-14 – 2013-11-15 (×2): 40 mg via INTRAVENOUS
  Filled 2013-11-14: qty 40

## 2013-11-14 MED ORDER — FLUCONAZOLE IN SODIUM CHLORIDE 200-0.9 MG/100ML-% IV SOLN
200.0000 mg | INTRAVENOUS | Status: DC
Start: 2013-11-15 — End: 2013-11-19
  Administered 2013-11-15 – 2013-11-18 (×4): 200 mg via INTRAVENOUS
  Filled 2013-11-14 (×5): qty 100

## 2013-11-14 MED ORDER — VANCOMYCIN HCL 10 G IV SOLR
2000.0000 mg | INTRAVENOUS | Status: AC
  Administered 2013-11-14: 2000 mg via INTRAVENOUS
  Filled 2013-11-14: qty 2000

## 2013-11-14 NOTE — Code Documentation (Signed)
Per EMS - pt had been having slurred speech and altered mental status for a few weeks, more altered today. Husband called EMS. Upon EMS arrival pt was altered, having slurred speech. Initially in SB, a.fib on monitor. CBG 156. Pt began to brady down, EMS started pacing, unable to get it to capture. Pt became unresponsive and showing asystole on monitor, unable to feel a pulse. Chest compressions started, administered 1 of Epi. Pt had ROSC. Light pulse felt, speaking mumbling words. 18 G in left hand. BP 60 palpated.

## 2013-11-14 NOTE — Progress Notes (Signed)
  Echocardiogram 2D Echocardiogram has been performed.  Tiffanny Lamarche 11/14/2013, 4:39 PM

## 2013-11-14 NOTE — Progress Notes (Signed)
ANTIBIOTIC CONSULT NOTE - INITIAL  Pharmacy Consult for zosyn, vancomycin, fluconazole Indication: shock, cellulitis  No Known Allergies  Patient Measurements: Wt 116kg (estimated in cath lab)  Vital Signs: Temp: 85.7 F (29.8 C) (01/06 0900) Temp src: Rectal (01/06 0900) BP: 85/63 mmHg (01/06 0900) Pulse Rate: 77 (01/06 0900) Intake/Output from previous day:   Intake/Output from this shift:    Labs:  Recent Labs  11/14/13 0830 11/14/13 0853  WBC 3.9*  --   HGB 5.9* 15.0  PLT 50*  --   CREATININE  --  2.50*   The CrCl is unknown because both a height and weight (above a minimum accepted value) are required for this calculation. No results found for this basename: VANCOTROUGH, VANCOPEAK, VANCORANDOM, GENTTROUGH, GENTPEAK, GENTRANDOM, TOBRATROUGH, TOBRAPEAK, TOBRARND, AMIKACINPEAK, AMIKACINTROU, AMIKACIN,  in the last 72 hours   Microbiology: No results found for this or any previous visit (from the past 720 hour(s)).  Medical History: Past Medical History  Diagnosis Date  . Hypertension   . Diabetes mellitus   . Atrial fibrillation   . MI (myocardial infarction)   . Anxiety   . Hypercholesterolemia   . Shoulder pain   . Sleep apnea     intolerant to CPAP  . Type 2 diabetes mellitus   . OSA (obstructive sleep apnea)   . Hypertensive heart disease   . Morbid obesity   . Pyogenic granuloma   . Cancer     cervical  . Depression   . Pneumonia   . CHF (congestive heart failure)     Medications:  Prescriptions prior to admission  Medication Sig Dispense Refill  . acetaminophen (TYLENOL) 500 MG tablet Take 1,000 mg by mouth 2 (two) times daily.      Marland Kitchen ALPRAZolam (XANAX) 0.25 MG tablet Take 0.25 mg by mouth at bedtime as needed for sleep. For anxiety      . aspirin EC 81 MG EC tablet Take 1 tablet (81 mg total) by mouth daily.  30 tablet  3  . Cholecalciferol (VITAMIN D) 1000 UNITS capsule Take 1,000 Units by mouth daily.        Marland Kitchen diltiazem (CARDIZEM CD) 180  MG 24 hr capsule TAKE ONE CAPSULE BY MOUTH EVERY DAY  90 capsule  1  . enalapril (VASOTEC) 2.5 MG tablet Take 1 tablet (2.5 mg total) by mouth 2 (two) times daily.  60 tablet  3  . furosemide (LASIX) 80 MG tablet Take 0.5 tablets (40 mg total) by mouth 3 (three) times daily.  90 tablet  3  . glimepiride (AMARYL) 2 MG tablet Take 1 tablet (2 mg total) by mouth daily before breakfast.  30 tablet  3  . Lancets (FREESTYLE) lancets Use as instructed  100 each  12  . levofloxacin (LEVAQUIN) 750 MG tablet Take 1 tablet (750 mg total) by mouth daily. X 12 days  12 tablet  0  . loperamide (IMODIUM) 2 MG capsule Take 1 capsule (2 mg total) by mouth as needed for diarrhea or loose stools. Also available over the counter  30 capsule  0  . metFORMIN (GLUCOPHAGE XR) 500 MG 24 hr tablet Take 1 tablet (500 mg total) by mouth daily with supper.  30 tablet  3  . metoprolol (LOPRESSOR) 50 MG tablet TAKE 1/2 TABLET TWICE A DAY ORALLY  90 tablet  3  . potassium chloride SA (K-DUR,KLOR-CON) 20 MEQ tablet Take 20 mEq by mouth 2 (two) times daily.       Marland Kitchen saccharomyces  boulardii (FLORASTOR) 250 MG capsule Take 1 capsule (250 mg total) by mouth 2 (two) times daily.  30 capsule  0  . sertraline (ZOLOFT) 100 MG tablet Take 100 mg by mouth at bedtime.        Marland Kitchen spironolactone (ALDACTONE) 25 MG tablet TAKE 1 TABLET BY MOUTH ONCE A DAY  30 tablet  6   Scheduled:  . insulin aspart  2-6 Units Subcutaneous Q4H  . [MAR HOLD] norepinephrine (LEVOPHED) Adult infusion  2-50 mcg/min Intravenous Once  . sodium chloride  750 mL Intravenous Once    Assessment: 80 yo female with bradycardic arrest s/p temporary transvenous pacemaker placement. Patient to begin vancomycin/zosyn for concern of septic shock/cellulitis noted with possible sources UTI and decubitis ulcer. WBC= 3.9, noted hypothermic, SCr= 2.5 (baseline SCr= 0.98-1.1), CrCl ~ 25-35.  Goal of Therapy:  Vancomycin trough= 15-20  Plan:  -Vancomycin 2000mg  IV now then 1500mg   IV q48hr -Zosyn 3.375gm IV q8h -Fluconazole 400mg  IV x1 then 200mg  IV daily -Will follow renal function, cultures and clinical progress  Hildred Laser, Pharm D 11/14/2013 11:26 AM

## 2013-11-14 NOTE — CV Procedure (Addendum)
Temporary transvenous pacemaker insertion.  Indication: bradycardic arrest with complete heart block.  The right groin was prepped and draped in a sterile fashion. The right groin was anesthetized with 1% lidocaine. The right femoral vein was accessed using a modified Seldinger technique. A 6 fr sheath was inserted into the right femoral vein. Under fluoroscopic guidance a temporary pacer wire was inserted and placed in the RV apex. Pacing capture was confirmed to less than 1 mA. The patient tolerated the procedure well. She is transferred to the ICU for further management of her acute illnesses.  Imp: Successful placement of temporary transvenous pacing wire.   Gonzalo Waymire Martinique MD, [facility]  11/14/2013 9:43 AM

## 2013-11-14 NOTE — Care Management Note (Addendum)
    Page 1 of 2   11/20/2013     1:24:31 PM   CARE MANAGEMENT NOTE 11/20/2013  Patient:  Tara Huff, Tara Huff   Account Number:  [id number]  Date Initiated:  11/14/2013  Documentation initiated by:  Elissa Hefty  Subjective/Objective Assessment:   adm w sepsis, temp pacer     Action/Plan:   lives w husb, pt has been w/c bound for several months, pcp dr Hal Hope Tamala Julian   Anticipated DC Date:     Anticipated DC Plan:  [place] referral  Clinical Social Worker      DC Forensic scientist  CM consult      [facility] Choice  HOME HEALTH   Choice offered to / List presented to:  C-1 Patient        [place] arranged  HH-1 RN  [place].   Status of service:   Medicare Important Message given?   (If response is "NO", the following Medicare IM given date fields will be blank) Date Medicare IM given:   Date Additional Medicare IM given:    Discharge Disposition:  [place]  Per UR Regulation:  Reviewed for med. necessity/level of care/duration of stay  If discussed at [place] of Stay Meetings, dates discussed:   11/21/2013    Comments:  1/12  1327 debbie Jerelle Virden rn,bsn pt spoke w sw and does not want snf again. she also does not want to use [facility] again for hhc. went over list of hhc agencies and no pref. ref to debbie taylor w [facility] for rn-pt-ot-aide. will await final orders.  1/6 1400 debbie Jermanie Minshall rn,bsn pt has hx w [facility] for hhc. will follow for dc needs as pt progresses.

## 2013-11-14 NOTE — H&P (Signed)
PULMONARY / CRITICAL CARE MEDICINE  Name: Tara Huff MRN: [id number] DOB: 1934-06-23    ADMISSION DATE:  11/14/2013 CONSULTATION DATE:  11/14/2013  REFERRING MD :  Cardiology  PRIMARY SERVICE: PCCM  CHIEF COMPLAINT:  Bradycardic arrest. Probable sepsis   BRIEF PATIENT DESCRIPTION: 80 yo admitted 1/6 s/p bradycardic arrest in setting of metabolic acidosis and probable sepsis. Underwent transvenous pacemaker placement.  SIGNIFICANT EVENTS / STUDIES:  1/6  Cath lab >>> Transvenous pacer placement 1/6  LE venous Dopplers>>> 1/6  TTE >>>  LINES / TUBES: Right groin venous temp pacemaker 1/6 >>>  CULTURES: 1/6 Blood culture >>> 1/6 Urine >>>  ANTIBIOTICS: Vancomycin 1/6 >>> Zosyn 1/6 >>> Diflucan 1/6 >>>  HISTORY OF PRESENT ILLNESS:   80 year old chronically ill appearing female w/ multiple co-morbids: AF, DM, OSA, CHF, states w/c bound since a fall several months ago. Admitted to [facility] ER 1/6 after being found by husband w/ AMS in the morning. Per EMS report has had some episodes of AMS and slurred speech for last few weeks. Shortly after EMS arrival she developed bradycardic arrest. ACLS and pacer started in field w/ ROSC. On arrival to ER found to be acidotic, w/ severe anemia. Brought to cath lab for temp pacer. Cards felt bradycardia likely metabolic in origin. PCCM asked to admit.   PAST MEDICAL HISTORY :  Past Medical History  Diagnosis Date  . Hypertension   . Diabetes mellitus   . Atrial fibrillation   . MI (myocardial infarction)   . Anxiety   . Hypercholesterolemia   . Shoulder pain   . Sleep apnea     intolerant to CPAP  . Type 2 diabetes mellitus   . OSA (obstructive sleep apnea)   . Hypertensive heart disease   . Morbid obesity   . Pyogenic granuloma   . Cancer     cervical  . Depression   . Pneumonia   . CHF (congestive heart failure)    Past Surgical History  Procedure Laterality Date  . Knee surgery      at age 68  . Cataract surgery      both  eyes  . Abdominal hysterectomy    . Toe amputation     Prior to Admission medications   Medication Sig Start Date End Date Taking? Authorizing Provider  acetaminophen (TYLENOL) 500 MG tablet Take 1,000 mg by mouth 2 (two) times daily.    Historical Provider, MD  ALPRAZolam Duanne Moron) 0.25 MG tablet Take 0.25 mg by mouth at bedtime as needed for sleep. For anxiety    Historical Provider, MD  aspirin EC 81 MG EC tablet Take 1 tablet (81 mg total) by mouth daily. 05/01/13   Ripudeep Krystal Eaton, MD  Cholecalciferol (VITAMIN D) 1000 UNITS capsule Take 1,000 Units by mouth daily.      Historical Provider, MD  diltiazem (CARDIZEM CD) 180 MG 24 hr capsule TAKE ONE CAPSULE BY MOUTH EVERY DAY 10/08/13   Sueanne Margarita, MD  enalapril (VASOTEC) 2.5 MG tablet Take 1 tablet (2.5 mg total) by mouth 2 (two) times daily. 05/02/13   Ripudeep Krystal Eaton, MD  furosemide (LASIX) 80 MG tablet Take 0.5 tablets (40 mg total) by mouth 3 (three) times daily. 05/02/13   Ripudeep Krystal Eaton, MD  glimepiride (AMARYL) 2 MG tablet Take 1 tablet (2 mg total) by mouth daily before breakfast. 05/02/13   Ripudeep Krystal Eaton, MD  Lancets (FREESTYLE) lancets Use as instructed 05/02/13   Ripudeep Krystal Eaton, MD  levofloxacin (LEVAQUIN) 750 MG tablet Take 1 tablet (750 mg total) by mouth daily. X 12 days 05/02/13   Ripudeep Krystal Eaton, MD  loperamide (IMODIUM) 2 MG capsule Take 1 capsule (2 mg total) by mouth as needed for diarrhea or loose stools. Also available over the counter 05/02/13   Ripudeep K Rai, MD  metFORMIN (GLUCOPHAGE XR) 500 MG 24 hr tablet Take 1 tablet (500 mg total) by mouth daily with supper. 05/02/13   Ripudeep Krystal Eaton, MD  metoprolol (LOPRESSOR) 50 MG tablet TAKE 1/2 TABLET TWICE A DAY ORALLY 09/13/13   Sueanne Margarita, MD  potassium chloride SA (K-DUR,KLOR-CON) 20 MEQ tablet Take 20 mEq by mouth 2 (two) times daily.     Historical Provider, MD  saccharomyces boulardii (FLORASTOR) 250 MG capsule Take 1 capsule (250 mg total) by mouth 2 (two) times daily.  05/02/13   Ripudeep Krystal Eaton, MD  sertraline (ZOLOFT) 100 MG tablet Take 100 mg by mouth at bedtime.      Historical Provider, MD  spironolactone (ALDACTONE) 25 MG tablet TAKE 1 TABLET BY MOUTH ONCE A DAY 09/04/13   Sueanne Margarita, MD   No Known Allergies  FAMILY HISTORY:  Family History  Problem Relation Age of Onset  . Emphysema Father   . Cancer Mother     intestinal   SOCIAL HISTORY:  reports that she quit smoking about 21 years ago. Her smoking use included Cigarettes. She has a 20 pack-year smoking history. She has never used smokeless tobacco. She reports that she does not drink alcohol or use illicit drugs.  REVIEW OF SYSTEMS:  SUBJECTIVE:   VITAL SIGNS: Temp:  [85.7 F (29.8 C)] 85.7 F (29.8 C) (01/06 0900) Pulse Rate:  [59-77] 77 (01/06 0900) Resp:  [7-22] 22 (01/06 0830) BP: (85-101)/(32-63) 85/63 mmHg (01/06 0900) SpO2:  [97 %] 97 % (01/06 0825)  HEMODYNAMICS:   VENTILATOR SETTINGS:   INTAKE / OUTPUT: Intake/Output   None    PHYSICAL EXAMINATION: General:  Chronically ill appearing white female, confused. Not in acute distress. Very weak  Neuro:  Awake, sp slurred. Oriented X2-3 HEENT:  MM dry, neck veins flat  Cardiovascular:  Paced rhythm  Lungs:  Decreased  Abdomen:  Obese + bowel sounds no OM Musculoskeletal:  Generalized weakness and deconditioning  Skin:  Chronic LE venous stasis changes, very weak, doppler only DP pulses. 3+ pitting edema bilaterally. Feet cool to palp. Multiple areas of ecchymosis over entire body. Small skin tear on right buttocks. Groin and panus w/ diffuse erythremic rash   LABS:  CBC  Recent Labs Lab 11/14/13 0830 11/14/13 0853  WBC 3.9*  --   HGB 5.9* 15.0  HCT 17.0* 44.0  PLT 50*  --    Coag's No results found for this basename: APTT, INR,  in the last 168 hours BMET  Recent Labs Lab 11/14/13 0853  NA 135*  K 5.3  CL 113*  BUN 57*  CREATININE 2.50*  GLUCOSE 286*   Electrolytes No results found for this  basename: CALCIUM, MG, PHOS,  in the last 168 hours Sepsis Markers No results found for this basename: LATICACIDVEN, PROCALCITON, O2SATVEN,  in the last 168 hours ABG  Recent Labs Lab 11/14/13 0851  PHART 7.094*  PCO2ART 29.0*  PO2ART 145.0*   Liver Enzymes No results found for this basename: AST, ALT, ALKPHOS, BILITOT, ALBUMIN,  in the last 168 hours Cardiac Enzymes No results found for this basename: TROPONINI, PROBNP,  in the last 168 hours  Glucose No results found for this basename: GLUCAP,  in the last 168 hours  Imaging No results found.  CXR:   ASSESSMENT / PLAN:  PULMONARY A:  H/o OHS. P:   Goal SpO2>92 Supplemental oxygen PRN DNI  CARDIOVASCULAR A:  Bradycardic cardiac arrest.  Symptomatic bradycardia.  Shock, likely septic. P:  Pacemaker per cards Trend lactate / troponin  Levophed gtt for MAP > 65 Hold antihypertensives DNR  RENAL A:   Acute renal failure.  Metabolic acidosis. AKI. P:   Trend BMP Bicarbonate gtt@100  Hold ACEI  GASTROINTESTINAL A:   Nutrition. GI Px.  P:   NPO for now  Protonix  HEMATOLOGIC A:   Anemia.  Thrombocytopenia. VTE Px. P:  Trend CBC Transfuse for goal Hb > 7 Hold Heparin and ASA SCDs  INFECTIOUS A:   Suspected UTI. Suspected cellulitis / decubitus infection. Candida dermatitis  P:   Cx / abx as above  ENDOCRINE A:   DM. P:   SSI  NEUROLOGIC A:   Acute encephalopathy. P:   Monitor clinically  I have personally obtained a history, examined the patient, evaluated laboratory and imaging results, formulated the assessment and plan and placed orders.  CRITICAL CARE: The patient is critically ill with multiple organ systems failure and requires high complexity decision making for assessment and support, frequent evaluation and titration of therapies, application of advanced monitoring technologies and extensive interpretation of multiple databases. Critical Care Time devoted to patient  care services described in this note is 45 minutes.   Doree Fudge, MD Pulmonary and [place] Pager: [phone]  11/14/2013, 10:28 AM

## 2013-11-14 NOTE — ED Provider Notes (Signed)
CSN: [id number]     Arrival date & time 11/14/13  7353 History   First MD Initiated Contact with Patient 11/14/13 [id number]     Chief Complaint  Patient presents with  . Bradycardia   (Consider location/radiation/quality/duration/timing/severity/associated sxs/prior Treatment) HPI Comments: Tara Huff is a 80 y.o. female who presents by EMS after resuscitation for bradycardic arrest. She arrived alert, breathing on her own, and being externally paced. She has been responsive since the arrest and answering questions.   Level V caveat- severe illness   The history is provided by the patient.    Past Medical History  Diagnosis Date  . Hypertension   . Diabetes mellitus   . Atrial fibrillation   . MI (myocardial infarction)   . Anxiety   . Hypercholesterolemia   . Shoulder pain   . Sleep apnea     intolerant to CPAP  . Type 2 diabetes mellitus   . OSA (obstructive sleep apnea)   . Hypertensive heart disease   . Morbid obesity   . Pyogenic granuloma   . Cancer     cervical  . Depression   . Pneumonia   . CHF (congestive heart failure)    Past Surgical History  Procedure Laterality Date  . Knee surgery      at age 70  . Cataract surgery      both eyes  . Abdominal hysterectomy    . Toe amputation     Family History  Problem Relation Age of Onset  . Emphysema Father   . Cancer Mother     intestinal   History  Substance Use Topics  . Smoking status: Former Smoker -- 1.00 packs/day for 20 years    Types: Cigarettes    Quit date: 11/09/1992  . Smokeless tobacco: Never Used  . Alcohol Use: No   OB History   Grav Para Term Preterm Abortions TAB SAB Ect Mult Living                 Review of Systems  Unable to perform ROS   Allergies  Review of patient's allergies indicates no known allergies.  Home Medications   Current Outpatient Rx  Name  Route  Sig  Dispense  Refill  . acetaminophen (TYLENOL) 500 MG tablet   Oral   Take 1,000 mg by mouth 2 (two)  times daily.         Marland Kitchen ALPRAZolam (XANAX) 0.25 MG tablet   Oral   Take 0.25 mg by mouth at bedtime as needed for sleep. For anxiety         . aspirin EC 81 MG EC tablet   Oral   Take 1 tablet (81 mg total) by mouth daily.   30 tablet   3   . Cholecalciferol (VITAMIN D) 1000 UNITS capsule   Oral   Take 1,000 Units by mouth daily.           Marland Kitchen diltiazem (CARDIZEM CD) 180 MG 24 hr capsule      TAKE ONE CAPSULE BY MOUTH EVERY DAY   90 capsule   1   . enalapril (VASOTEC) 2.5 MG tablet   Oral   Take 1 tablet (2.5 mg total) by mouth 2 (two) times daily.   60 tablet   3   . furosemide (LASIX) 80 MG tablet   Oral   Take 0.5 tablets (40 mg total) by mouth 3 (three) times daily.   90 tablet   3   . glimepiride (  AMARYL) 2 MG tablet   Oral   Take 1 tablet (2 mg total) by mouth daily before breakfast.   30 tablet   3   . Lancets (FREESTYLE) lancets      Use as instructed   100 each   12   . levofloxacin (LEVAQUIN) 750 MG tablet   Oral   Take 1 tablet (750 mg total) by mouth daily. X 12 days   12 tablet   0   . loperamide (IMODIUM) 2 MG capsule   Oral   Take 1 capsule (2 mg total) by mouth as needed for diarrhea or loose stools. Also available over the counter   30 capsule   0   . metFORMIN (GLUCOPHAGE XR) 500 MG 24 hr tablet   Oral   Take 1 tablet (500 mg total) by mouth daily with supper.   30 tablet   3   . metoprolol (LOPRESSOR) 50 MG tablet      TAKE 1/2 TABLET TWICE A DAY ORALLY   90 tablet   3   . potassium chloride SA (K-DUR,KLOR-CON) 20 MEQ tablet   Oral   Take 20 mEq by mouth 2 (two) times daily.          Marland Kitchen saccharomyces boulardii (FLORASTOR) 250 MG capsule   Oral   Take 1 capsule (250 mg total) by mouth 2 (two) times daily.   30 capsule   0   . sertraline (ZOLOFT) 100 MG tablet   Oral   Take 100 mg by mouth at bedtime.           Marland Kitchen spironolactone (ALDACTONE) 25 MG tablet      TAKE 1 TABLET BY MOUTH ONCE A DAY   30 tablet    6    BP 101/32  Pulse 59  Resp 22  SpO2 97% Physical Exam  Nursing note and vitals reviewed. Constitutional: She appears well-developed.  Obese elderly, unkempt  HENT:  Head: Normocephalic and atraumatic.  Eyes: Conjunctivae and EOM are normal. Pupils are equal, round, and reactive to light.  Neck: Normal range of motion and phonation normal. Neck supple.  Cardiovascular: Normal rate and intact distal pulses.   External pacing by EMS, on arrival at 60 per minute. Normal radial and femoral pulses. Feet appear somewhat cyanotic.  Pulmonary/Chest: Effort normal. She exhibits no tenderness.  Shallow breath sounds. She is able to take deep breaths on command  Abdominal: Soft. She exhibits no distension. There is no tenderness. There is no guarding.  Musculoskeletal: Normal range of motion.  Neurological: She is alert. She exhibits normal muscle tone.  Skin: Skin is warm and dry.  Intertriginous rash of the groin, and panniculus, red, excoriated.  Psychiatric:  She is calm    ED Course  Procedures (including critical care time)  Medications  norepinephrine (LEVOPHED) 4 mg in dextrose 5 % 250 mL infusion (not administered)    Patient Vitals for the past 24 hrs:  BP Pulse Resp SpO2  11/14/13 0830 101/32 mmHg - 22 -  11/14/13 0825 - 59 7 97 %   08:28- contacted cardiology, they will come to the emergency department to assist with placement of internal pacemaker.   08:55- she remains alert, is still being paced externally, and has systolic blood pressure in the 80s. Dr. Martinique, cardiologist, is managing her care.  CRITICAL CARE Performed by: Richarda Blade Total critical care time: 35 minutes Critical care time was exclusive of separately billable procedures and treating other patients. Critical care  was necessary to treat or prevent imminent or life-threatening deterioration. Critical care was time spent personally by me on the following activities: development of treatment  plan with patient and/or surrogate as well as nursing, discussions with consultants, evaluation of patient's response to treatment, examination of patient, obtaining history from patient or surrogate, ordering and performing treatments and interventions, ordering and review of laboratory studies, ordering and review of radiographic studies, pulse oximetry and re-evaluation of patient's condition.    Labs Review Labs Reviewed  POCT I-STAT 3, BLOOD GAS (G3+) - Abnormal; Notable for the following:    pH, Arterial 7.094 (*)    pCO2 arterial 29.0 (*)    pO2, Arterial 145.0 (*)    Bicarbonate 8.9 (*)    Acid-base deficit 20.0 (*)    All other components within normal limits  POCT I-STAT, CHEM 8 - Abnormal; Notable for the following:    Sodium 135 (*)    Chloride 113 (*)    BUN 57 (*)    Creatinine, Ser 2.50 (*)    Glucose, Bld 286 (*)    All other components within normal limits  CBC WITH DIFFERENTIAL  BASIC METABOLIC PANEL  BLOOD GAS, ARTERIAL  POCT I-STAT TROPONIN I   Imaging Review No results found.  EKG Interpretation   None       MDM   1. Bradycardia   2. Cardiac arrest   3. Externally paced cardiac rhytm      Bradycardic arrest, possibly with a period of asystole. She is alert and appears to be at her baseline mentally. She does not meet criteria for hypothermic treatment. She will likely require internal pacemaking as a bridge to a permanent pacemaker. Records indicate that she is DO NOT RESUSCITATE, in June 2014. She lives at home with her husband, who is not here currently.  Nursing Notes Reviewed/ Care Coordinated, and agree without changes. Applicable Imaging Reviewed.  Interpretation of Laboratory Data incorporated into ED treatment  Plan: Admit  Richarda Blade, MD 11/14/13 [phone]

## 2013-11-14 NOTE — Progress Notes (Addendum)
CRITICAL VALUE ALERT  Critical value received:  Blood Glucose 44  Date of notification:  11/14/2013  Time of notification:  2305  Critical value read back:yes  Nurse who received alert:  Dennison Mascot  MD notified (1st page):  Warren Lacy MD, [place]  Time of first page:  2309  Responding MD:  Warren Lacy

## 2013-11-14 NOTE — ED Notes (Signed)
Dr. Martinique aware of pt's rectal temperature. Warm blankets applied.

## 2013-11-14 NOTE — ED Notes (Addendum)
Pt still being paced with prior noted parameters. Is tolerating appropriately.

## 2013-11-14 NOTE — Code Documentation (Signed)
Pt being paced at 60 bpm at 74 mili-amps.

## 2013-11-14 NOTE — ED Notes (Addendum)
Cath lab ready for patient. Transporting pt upstairs with Dr. Martinique at bedside. Pt on NRB. Is alert, maintaining airway  Has nasal trumphet airway from EMS. Pt is following commands. No family arrived at this time. Pt is still being paced.

## 2013-11-14 NOTE — Consult Note (Signed)
CARDIOLOGY CONSULT NOTE     Patient ID: Tara Huff MRN: [id number] DOB/AGE: 03-28-1934 80 y.o.  Admit date: 11/14/2013 Referring Physician Mila Palmer MD Primary Physician Jeremy Johann MD Primary Cardiologist Fransico Him MD Reason for Consultation cardiac arrest with bradycardia, complete heart block.  HPI:  Tara Huff is seen at the request of the ED for cardiac evaluation post cardiac arrest with bradycardia. The patient is a chronically ill 80 yo WF with history of Atrial fibrillation, DM, right heart failure, and pulmonary HTN. The patient is unable to give history and there is no family available to give history. According to EMS patient has had slurred speech for several months. Today she was noted to have acutely altered mental status and EMS was called. On arrival patient was noted to be markedly bradycardic. While transporting the patient became unresponsive and lost her pulse. CPR was initiated and she was given IV epinephrine. She was paced transcutaneously with return of blood pressure. Patient is awake and alert and breathing spontaneously. She is completely pacemaker dependent. She was last admitted in June 2014 with Afib with RVR and E. Coli UTI. She also had significant right heart failure. She was in a nursing facility for 90 days then DC home. She has a history of severe OSA, morbid obesity, and intolerant of CPAP. Is not anticoagulated for afib. On her last hosptalization she was DNR/DNI per her request.  Review of systems complete and found to be negative unless listed above   Past Medical History  Diagnosis Date  . Hypertension   . Diabetes mellitus   . Atrial fibrillation   . MI (myocardial infarction)   . Anxiety   . Hypercholesterolemia   . Shoulder pain   . Sleep apnea     intolerant to CPAP  . Type 2 diabetes mellitus   . OSA (obstructive sleep apnea)   . Hypertensive heart disease   . Morbid obesity   . Pyogenic granuloma   . Cancer     cervical  .  Depression   . Pneumonia   . CHF (congestive heart failure)     Family History  Problem Relation Age of Onset  . Emphysema Father   . Cancer Mother     intestinal    History   Social History  . Marital Status: Married    Spouse Name: N/A    Number of Children: 1  . Years of Education: N/A   Occupational History  . retired    Social History Main Topics  . Smoking status: Former Smoker -- 1.00 packs/day for 20 years    Types: Cigarettes    Quit date: 11/09/1992  . Smokeless tobacco: Never Used  . Alcohol Use: No  . Drug Use: No  . Sexual Activity: No   Other Topics Concern  . Not on file   Social History Narrative  . No narrative on file    Past Surgical History  Procedure Laterality Date  . Knee surgery      at age 80  . Cataract surgery      both eyes  . Abdominal hysterectomy    . Toe amputation       Prescriptions prior to admission  Medication Sig Dispense Refill  . acetaminophen (TYLENOL) 500 MG tablet Take 1,000 mg by mouth 2 (two) times daily.      Marland Kitchen ALPRAZolam (XANAX) 0.25 MG tablet Take 0.25 mg by mouth at bedtime as needed for sleep. For anxiety      .  aspirin EC 81 MG EC tablet Take 1 tablet (81 mg total) by mouth daily.  30 tablet  3  . Cholecalciferol (VITAMIN D) 1000 UNITS capsule Take 1,000 Units by mouth daily.        Marland Kitchen diltiazem (CARDIZEM CD) 180 MG 24 hr capsule TAKE ONE CAPSULE BY MOUTH EVERY DAY  90 capsule  1  . enalapril (VASOTEC) 2.5 MG tablet Take 1 tablet (2.5 mg total) by mouth 2 (two) times daily.  60 tablet  3  . furosemide (LASIX) 80 MG tablet Take 0.5 tablets (40 mg total) by mouth 3 (three) times daily.  90 tablet  3  . glimepiride (AMARYL) 2 MG tablet Take 1 tablet (2 mg total) by mouth daily before breakfast.  30 tablet  3  . Lancets (FREESTYLE) lancets Use as instructed  100 each  12  . levofloxacin (LEVAQUIN) 750 MG tablet Take 1 tablet (750 mg total) by mouth daily. X 12 days  12 tablet  0  . loperamide (IMODIUM) 2 MG  capsule Take 1 capsule (2 mg total) by mouth as needed for diarrhea or loose stools. Also available over the counter  30 capsule  0  . metFORMIN (GLUCOPHAGE XR) 500 MG 24 hr tablet Take 1 tablet (500 mg total) by mouth daily with supper.  30 tablet  3  . metoprolol (LOPRESSOR) 50 MG tablet TAKE 1/2 TABLET TWICE A DAY ORALLY  90 tablet  3  . potassium chloride SA (K-DUR,KLOR-CON) 20 MEQ tablet Take 20 mEq by mouth 2 (two) times daily.       Marland Kitchen saccharomyces boulardii (FLORASTOR) 250 MG capsule Take 1 capsule (250 mg total) by mouth 2 (two) times daily.  30 capsule  0  . sertraline (ZOLOFT) 100 MG tablet Take 100 mg by mouth at bedtime.        Marland Kitchen spironolactone (ALDACTONE) 25 MG tablet TAKE 1 TABLET BY MOUTH ONCE A DAY  30 tablet  6    Physical Exam: Blood pressure 85/63, pulse 77, temperature 85.7 F (29.8 C), temperature source Rectal, resp. rate 22, SpO2 97.00%. She is a morbidly obese WF, alert and oriented x 2.  HEENT: [place]/AT, PERRLA. Sclera are clear. Oropharynx is clear.  Neck: Severe JVD to the jaw. No carotid bruits. No adenopathy or thyromegaly. Lungs: clear anteriorly CV: transcutaneously paced. Distant HS. No murmur.  Abdomen: morbidly obese with large pannus. Soft nontender. BS audible. No obvious masses. Extremities: Cool and clammy, 2+ pretibial edema with severe hyperkeratotic skin changes. Several ulcerated sores on her toes bilaterally. The mid toe on her left foot is amputated. Skin: Severe red rash in the pelvic/groin regions c/w yeast infection. There is a large decubitus ulcer on her left buttocks. Neuro: patient is alert, oriented x 2. Moves extremities.  Labs:   Lab Results  Component Value Date   WBC 3.9* 11/14/2013   HGB 15.0 11/14/2013   HCT 44.0 11/14/2013   MCV 91.4 11/14/2013   PLT 50* 11/14/2013    Recent Labs Lab 11/14/13 0853  NA 135*  K 5.3  CL 113*  BUN 57*  CREATININE 2.50*  GLUCOSE 286*   Lab Results  Component Value Date   CKTOTAL 72 03/20/2012    CKMB 2.7 03/20/2012   TROPONINI <0.30 04/28/2013    Lab Results  Component Value Date   CHOL 87 03/20/2012   CHOL  Value: 92        ATP III CLASSIFICATION:  <200     mg/dL  Desirable  200-239  mg/dL   Borderline High  >=240    mg/dL   High        07/31/2010   Lab Results  Component Value Date   HDL 31* 03/20/2012   HDL 29* 07/31/2010   Lab Results  Component Value Date   LDLCALC 47 03/20/2012   LDLCALC  Value: 53        Total Cholesterol/HDL:CHD Risk Coronary Heart Disease Risk Table                     Men   Women  1/2 Average Risk   3.4   3.3  Average Risk       5.0   4.4  2 X Average Risk   9.6   7.1  3 X Average Risk  23.4   11.0        Use the calculated Patient Ratio above and the CHD Risk Table to determine the patient's CHD Risk.        ATP III CLASSIFICATION (LDL):  <100     mg/dL   Optimal  100-129  mg/dL   Near or Above                    Optimal  130-159  mg/dL   Borderline  160-189  mg/dL   High  >190     mg/dL   Very High 07/31/2010   Lab Results  Component Value Date   TRIG 47 03/20/2012   TRIG 52 07/31/2010   Lab Results  Component Value Date   CHOLHDL 2.8 03/20/2012   CHOLHDL 3.2 07/31/2010   No results found for this basename: LDLDIRECT      Radiology: pending EKG: Rhythm completely paced.  Echo: 04/27/13: [facility]* [place], [place] [address] [phone]  ------------------------------------------------------------ Transthoracic Echocardiography  (Report amended )  Patient: Gennette, Shadix MR #: [id number] Study Date: 04/30/2013 Gender: F Age: 63 Height: 172.7cm Weight: 123.8kg BSA: 2.60m^2 Pt. Status: Room: [facility]  PERFORMING [facility] ORDERING Rizwan, Saima REFERRING Rizwan, [place] ADMITTING Doutova, Anastassia ATTENDING Rai, Ripudeep SONOGRAPHER Wyatt Mage, RDCS cc:  ------------------------------------------------------------ LV EF: 55% -  60%  ------------------------------------------------------------ Indications: CHF - 428.0.  ------------------------------------------------------------ History: PMH: Atrial fibrillation. PMH: Myocardial infarction. Risk factors: UTI. Diarrhea. Hyponatremia. Debility. Endometrium carcinoma. Anxiety. Pulmonary hypertension. Osteomyelitis. Cough. Sleep apnea. Hypertension. Diabetes mellitus. Morbidly obese. Dyslipidemia.  ------------------------------------------------------------ Study Conclusions  - Left ventricle: The cavity size was normal. There was mild concentric hypertrophy. Systolic function was normal. The estimated ejection fraction was in the range of 55% to 60%. - Aorta: There is evidence of an aortic aneurysm in the descending aorta measureing 5.46cm in diameter. - Mitral valve: Mild regurgitation. - Left atrium: The atrium was moderately dilated. - Right ventricle: The cavity size was mildly dilated. Wall thickness was normal. - Right atrium: The atrium was severely dilated. - Tricuspid valve: Moderate regurgitation. - Pulmonary arteries: PA peak pressure: 85mm Hg (S). Impressions:  - The right ventricular systolic pressure was increased consistent with mild pulmonary hypertension.    ASSESSMENT AND PLAN:  1. Complete heart block with atrial fibrillation. Patient is on chronic beta blocker and diltiazem for rate control. I think heart block is probably reltated to multiple acute metabolic derangements with severe hypothermia, renal failure, hyperkalemia, metabolic acidosis and possible sepsis. She is hypotensive. Recommend proceeding with emergent placement of temporary transvenous pacing catheter. Will hold diltiazem and metoprolol. Try and  correct underlying metabolic problems. 2. Hypothermia 3. Metabolic acidosis. 4. Acute renal failure.  5. Shock-most likely related to sepsis. Multiple potential sources of infection including UTI, sacral decubitus,  pulmonary.  6. Chronic right heart failure. 7. Morbid obesity with OSA. 8. Stasis ulcers of toes. 9. DM type 2.  10. Yeast infection-groin. 11. Decubitus ulcer- buttocks.  Disposition: proceed with placement of temporary transvenous pacemaker. Blood and urine cultures. CXR. Cycle cardiac enzymes. Started on IV levophed for persistent hypotension. CCM consulted for admission and cardiology will consult.   SignedCollier [facility] 11/14/2013, 10:04 AM

## 2013-11-15 ENCOUNTER — Encounter (HOSPITAL_COMMUNITY): Payer: Self-pay | Admitting: Internal Medicine

## 2013-11-15 ENCOUNTER — Inpatient Hospital Stay (HOSPITAL_COMMUNITY): Payer: Medicare Other

## 2013-11-15 DIAGNOSIS — I4891 Unspecified atrial fibrillation: Secondary | ICD-10-CM

## 2013-11-15 DIAGNOSIS — M7989 Other specified soft tissue disorders: Secondary | ICD-10-CM

## 2013-11-15 DIAGNOSIS — A419 Sepsis, unspecified organism: Secondary | ICD-10-CM

## 2013-11-15 DIAGNOSIS — R6521 Severe sepsis with septic shock: Secondary | ICD-10-CM

## 2013-11-15 LAB — CBC
HEMATOCRIT: 33.1 % — AB (ref 36.0–46.0)
HEMATOCRIT: 32.3 % — AB (ref 36.0–46.0)
HEMOGLOBIN: 11.8 g/dL — AB (ref 12.0–15.0)
Hemoglobin: 11.9 g/dL — ABNORMAL LOW (ref 12.0–15.0)
MCH: 31.6 pg (ref 26.0–34.0)
MCH: 32.2 pg (ref 26.0–34.0)
MCHC: 35.6 g/dL (ref 30.0–36.0)
MCHC: 36.8 g/dL — AB (ref 30.0–36.0)
MCV: 88.5 fL (ref 78.0–100.0)
MCV: 87.5 fL (ref 78.0–100.0)
PLATELETS: 96 10*3/uL — AB (ref 150–400)
Platelets: 111 10*3/uL — ABNORMAL LOW (ref 150–400)
RBC: 3.74 MIL/uL — AB (ref 3.87–5.11)
RBC: 3.69 MIL/uL — ABNORMAL LOW (ref 3.87–5.11)
RDW: 16.5 % — ABNORMAL HIGH (ref 11.5–15.5)
RDW: 16.4 % — AB (ref 11.5–15.5)
WBC: 9 10*3/uL (ref 4.0–10.5)
WBC: 7.9 10*3/uL (ref 4.0–10.5)

## 2013-11-15 LAB — GLUCOSE, CAPILLARY
GLUCOSE-CAPILLARY: 239 mg/dL — AB (ref 70–99)
GLUCOSE-CAPILLARY: 56 mg/dL — AB (ref 70–99)
GLUCOSE-CAPILLARY: 116 mg/dL — AB (ref 70–99)
GLUCOSE-CAPILLARY: 124 mg/dL — AB (ref 70–99)
Glucose-Capillary: 112 mg/dL — ABNORMAL HIGH (ref 70–99)
Glucose-Capillary: 121 mg/dL — ABNORMAL HIGH (ref 70–99)
Glucose-Capillary: 40 mg/dL — CL (ref 70–99)
Glucose-Capillary: 51 mg/dL — ABNORMAL LOW (ref 70–99)
Glucose-Capillary: 51 mg/dL — ABNORMAL LOW (ref 70–99)
Glucose-Capillary: 90 mg/dL (ref 70–99)
Glucose-Capillary: 90 mg/dL (ref 70–99)

## 2013-11-15 LAB — COMPREHENSIVE METABOLIC PANEL
ALK PHOS: 133 U/L — AB (ref 39–117)
ALT: 17 U/L (ref 0–35)
AST: 34 U/L (ref 0–37)
Albumin: 2.2 g/dL — ABNORMAL LOW (ref 3.5–5.2)
BILIRUBIN TOTAL: 0.7 mg/dL (ref 0.3–1.2)
BUN: 60 mg/dL — AB (ref 6–23)
CHLORIDE: 106 meq/L (ref 96–112)
CO2: 21 mEq/L (ref 19–32)
Calcium: 7.8 mg/dL — ABNORMAL LOW (ref 8.4–10.5)
Creatinine, Ser: 2.49 mg/dL — ABNORMAL HIGH (ref 0.50–1.10)
GFR calc non Af Amer: 18 mL/min — ABNORMAL LOW (ref >90)
GFR, EST AFRICAN AMERICAN: 20 mL/min — AB (ref >90)
GLUCOSE: 106 mg/dL — AB (ref 70–99)
POTASSIUM: 5.2 meq/L (ref 3.7–5.3)
SODIUM: 141 meq/L (ref 137–147)
TOTAL PROTEIN: 6.1 g/dL (ref 6.0–8.3)

## 2013-11-15 LAB — URINALYSIS, ROUTINE W REFLEX MICROSCOPIC
Bilirubin Urine: NEGATIVE
Glucose, UA: NEGATIVE mg/dL
Ketones, ur: NEGATIVE mg/dL
Nitrite: NEGATIVE
PROTEIN: NEGATIVE mg/dL
Specific Gravity, Urine: 1.011 (ref 1.005–1.030)
Urobilinogen, UA: 0.2 mg/dL (ref 0.0–1.0)
pH: 5 (ref 5.0–8.0)

## 2013-11-15 LAB — URINE MICROSCOPIC-ADD ON

## 2013-11-15 MED ORDER — DEXTROSE 50 % IV SOLN
INTRAVENOUS | Status: AC
  Filled 2013-11-15: qty 50

## 2013-11-15 MED ORDER — DEXTROSE 50 % IV SOLN
25.0000 mL | Freq: Once | INTRAVENOUS | Status: AC | PRN
Start: 2013-11-15 — End: 2013-11-15
  Administered 2013-11-15: 25 mL via INTRAVENOUS

## 2013-11-15 MED ORDER — SODIUM BICARBONATE 8.4 % IV SOLN
INTRAVENOUS | Status: DC
  Administered 2013-11-15: 15:00:00 via INTRAVENOUS
  Filled 2013-11-15 (×2): qty 1000

## 2013-11-15 MED ORDER — DEXTROSE 50 % IV SOLN
25.0000 mL | INTRAVENOUS | Status: DC | PRN
  Administered 2013-11-15: 25 mL via INTRAVENOUS

## 2013-11-15 MED ORDER — DEXTROSE 10 % IV SOLN
INTRAVENOUS | Status: AC
  Administered 2013-11-15: 30 mL/h via INTRAVENOUS
  Administered 2013-11-16: 01:00:00 via INTRAVENOUS

## 2013-11-15 MED ORDER — PHENYLEPHRINE HCL 10 MG/ML IJ SOLN
30.0000 ug/min | INTRAVENOUS | Status: DC
  Administered 2013-11-15: 50 ug/min via INTRAVENOUS
  Administered 2013-11-15: 30 ug/min via INTRAVENOUS
  Administered 2013-11-16: 50 ug/min via INTRAVENOUS
  Filled 2013-11-15 (×4): qty 1

## 2013-11-15 NOTE — Progress Notes (Signed)
PULMONARY / CRITICAL CARE MEDICINE  Name: Tara Huff MRN: [id number] DOB: 1934-06-07    ADMISSION DATE:  11/14/2013 CONSULTATION DATE:  11/14/2013  REFERRING MD :  Cardiology  PRIMARY SERVICE: PCCM  CHIEF COMPLAINT:  Bradycardic arrest. Probable sepsis   BRIEF PATIENT DESCRIPTION: 80 yo admitted 1/6 s/p bradycardic arrest in setting of metabolic acidosis and probable sepsis. Underwent transvenous pacemaker placement.  SIGNIFICANT EVENTS / STUDIES:  1/6  Cath lab >>> Transvenous pacer placement 1/6  LE venous Dopplers>>> prelim neg for DVT 1/6  TTE >>> EF 40%, LA mod dilated, RA severely dilated, mod-severe tricuspid regurg  LINES / TUBES: Right groin venous temp pacemaker 1/6 >>>1/7 L IJ CVL 1/7>>  CULTURES: 1/6 Blood culture >>>NGTD 1/6 Urine >>> 1/6 MRSA>>>pos  ANTIBIOTICS: Vancomycin 1/6 >>> Zosyn 1/6 >>> Diflucan 1/6 >>>  SUBJECTIVE: This morning, bicarb has improved to 16.  Patient has been paced overnight, but HR improved to the 110's this morning, and pacer was removed.  Cr rising, but good UOP.  Hypothermia resolved, and anion gap narrowing.  VITAL SIGNS: Temp:  [87.3 F (30.7 C)-100.2 F (37.9 C)] 98.1 F (36.7 C) (01/07 1200) Pulse Rate:  [80-130] 118 (01/07 1200) Resp:  [13-30] 22 (01/07 1200) BP: (75-132)/(39-74) 120/51 mmHg (01/07 1200) SpO2:  [97 %-100 %] 98 % (01/07 1200) Weight:  [255 lb 11.7 oz (116 kg)] 255 lb 11.7 oz (116 kg) (01/07 0500)  HEMODYNAMICS:   VENTILATOR SETTINGS:   INTAKE / OUTPUT: Intake/Output     01/06 0701 - 01/07 0700 01/07 0701 - 01/08 0700   I.V. (mL/kg) 2601.6 (22.4) 925 (8)   IV Piggyback 800    Total Intake(mL/kg) 3401.6 (29.3) 925 (8)   Urine (mL/kg/hr) 1900 200 (0.3)   Stool 2    Total Output 1902 200   Net +1499.6 +725         PHYSICAL EXAMINATION: General:  Chronically ill appearing white female, confused. Not in acute distress. Very weak  Neuro:  Awake, sp slurred. Oriented X2-3 HEENT:  MM dry, neck veins  flat  Cardiovascular:  Paced rhythm  Lungs:  Decreased  Abdomen:  Obese + bowel sounds no OM Musculoskeletal:  Generalized weakness and deconditioning  Skin:  Chronic LE venous stasis changes, very weak, doppler only DP pulses. 3+ pitting edema bilaterally. Feet cool to palp. Multiple areas of ecchymosis over entire body. Small skin tear on right buttocks. Groin and panus w/ diffuse erythremic rash   PEX General: lying in bed, able to follow commands, but confused HEENT: PERRL, EOMI, oropharynx clear and non-erythematous  Neck: supple Lungs: tachypneic, no wheezes, rales, or ronchi Heart: tachycardic, irregularly irregular rhythm Abdomen: soft, non-tender, non-distended, normal bowel sounds. GU: Large area of erythema around groin and intertriginous areas, with wide ulcer on right posterior thigh. Extremities: 2+ pitting edema, multiple scattered white plaques on bilateral feet Neurologic: A&O to person, follows commands without difficulty, moves all 4 extremities  LABS:  CBC  Recent Labs Lab 11/14/13 2202 11/15/13 0200 11/15/13 1145  WBC 11.0* 9.0 7.9  HGB 12.3 11.8* 11.9*  HCT 34.4* 33.1* 32.3*  PLT PLATELET CLUMPS NOTED ON SMEAR, UNABLE TO ESTIMATE 111* 96*   Coag's  Recent Labs Lab 11/14/13 1230  INR 1.34   BMET  Recent Labs Lab 11/14/13 0853 11/14/13 1230 11/14/13 2202 11/15/13 1145  NA 135*  --  136* 141  K 5.3  --  5.3 5.2  CL 113*  --  105 106  CO2  --   --  16* 21  BUN 57*  --  61* 60*  CREATININE 2.50* 2.44* 2.40* 2.49*  GLUCOSE 286*  --  44* 106*   Electrolytes  Recent Labs Lab 11/14/13 2202 11/15/13 1145  CALCIUM 8.1* 7.8*   Sepsis Markers  Recent Labs Lab 11/14/13 1000 11/14/13 1001  LATICACIDVEN  --  2.1  PROCALCITON 0.24  --    ABG  Recent Labs Lab 11/14/13 0851  PHART 7.094*  PCO2ART 29.0*  PO2ART 145.0*   Liver Enzymes  Recent Labs Lab 11/15/13 1145  AST 34  ALT 17  ALKPHOS 133*  BILITOT 0.7  ALBUMIN 2.2*    Cardiac Enzymes  Recent Labs Lab 11/14/13 1002 11/14/13 1602 11/14/13 2202  TROPONINI <0.30 <0.30 <0.30  PROBNP 1616.0*  --   --    Glucose  Recent Labs Lab 11/14/13 2304 11/14/13 2325 11/15/13 0158 11/15/13 0235 11/15/13 0405 11/15/13 0622  GLUCAP 51* 95 51* 90 51* 40*    Imaging Dg Chest Port 1 View  11/14/2013   CLINICAL DATA:  Cardiac arrhythmia  EXAM: PORTABLE CHEST - 1 VIEW  COMPARISON:  Study obtained earlier in the day  FINDINGS: There is a pacemaker lead in the right ventricular region. No pneumothorax.  There is no edema or consolidation. Heart is enlarged with pulmonary vascularity within normal limits. No adenopathy. There is arthropathy in both shoulders.  IMPRESSION: Stable cardiomegaly. No frank edema or consolidation. Pacemaker lead in region of right ventricle. No pneumothorax.   Electronically Signed   By: Lowella Grip M.D.   On: 11/14/2013 19:27   Dg Chest Port 1 View  11/14/2013   CLINICAL DATA:  80 year old female. Preoperative study, pacemaker placement. Initial encounter.  EXAM: PORTABLE CHEST - 1 VIEW  COMPARISON:  CT ANGIO CHEST AORTA W/CM \\T \/OR WO/CM dated 05/01/2013; DG CHEST 1V PORT dated 04/27/2013  FINDINGS: There is cardiomegaly. Other mediastinal contours are within normal limits. The patient is rotated to the right. No pneumothorax, pleural effusion or consolidation identified. Continued pulmonary vascular congestion, appears increased compared to 04/27/2013.  IMPRESSION: Cardiomegaly and pulmonary vascular congestion / interstitial edema which appears increased compared to 04/27/2013. No pneumonia identified.   Electronically Signed   By: Lars Pinks M.D.   On: 11/14/2013 11:23    CXR: mild interstitial edema  ASSESSMENT / PLAN:  PULMONARY A:  H/o OHS. P:   -Goal SpO2>92 -Supplemental oxygen PRN -DNI  CARDIOVASCULAR A:  Bradycardic cardiac arrest. - likely secondary to metabolic disturbances. Now resolved, temp pacer  removed. Afib Shock, likely septic. P:  -temp pacemaker d/c'ed today per cards -plan to restart metoprolol likely later today vs tomorrow -d/c levophed, start neosynephrine for BP support (less interference with afib), MAP goal > 65 -DNR -central line placed today  RENAL A:   Acute renal failure - likely 2/2 ATN from septic shock/cardiac arrest Metabolic acidosis. P:   -Trend BMP -will change bicarb drip today to D5-1/2NS with 75 mEq bicarb, then likely d/c in the AM -Hold ACEI  GASTROINTESTINAL A:   Nutrition. GI Px.  P:   -NPO for now  -can try swallow study and feeding when mental status improves, perhaps as early as later today -Protonix  HEMATOLOGIC A:   Anemia.  Thrombocytopenia. VTE Px. P:  -Trend CBC -Transfuse for goal Hb > 7 -Hold Heparin and ASA -SCDs  INFECTIOUS A:   Suspected UTI - pyuria on UA, awaiting culture Suspected cellulitis / decubitus infection - wide superficial ulcer on R posterior thigh Candida dermatitis  P:   -Cx / abx as above  ENDOCRINE A:   DM. Hypoglycemia - likely due to sepsis, pt currently NPO P:   -SSI -D10 drip at 60 cc/hr  NEUROLOGIC A:   Acute encephalopathy. P:   Monitor clinically  Code Status: I spoke with the patient's husband today, and updated him on the patient's condition.  He confirmed that the patient is DNR/DNI, that this is consistent with her wishes, and that she has a signed DNR form at their house.  He expressed appreciation for our continued care.   Elnora Morrison, PGY3 Pgr. [phone]  Attending:  I have seen and examined the patient with nurse practitioner/resident and agree with the note above.   Improved from 1/6, still on pressors, source likely skin given severe groin rash (does not appear to be necrotizing)  Continue gentle IVF today, hopefully d/c pacer wire soon   I have personally obtained a history, examined the patient, evaluated laboratory and imaging results, formulated the  assessment and plan and placed orders.  CRITICAL CARE: The patient is critically ill with multiple organ systems failure and requires high complexity decision making for assessment and support, frequent evaluation and titration of therapies, application of advanced monitoring technologies and extensive interpretation of multiple databases. Critical Care Time devoted to patient care services described in this note is 35 minutes.   Jillyn Hidden PCCM Pager: [phone] Cell: [phone] If no response, call [phone]  11/15/2013, 12:59 PM

## 2013-11-15 NOTE — Progress Notes (Signed)
Pt appears to be tachycardic/restless. Dr. Owens Shark notified. No new orders at this time.

## 2013-11-15 NOTE — Progress Notes (Signed)
*  PRELIMINARY RESULTS* Vascular Ultrasound Lower extremity venous duplex has been completed.  Preliminary findings: Technically limited due to patient body habitus and severe pain. No evidence of DVT in visualized veins.    Landry Mellow, RDMS, RVT  11/15/2013, 12:06 PM

## 2013-11-15 NOTE — Progress Notes (Addendum)
Hypoglycemic Event  CBG: 56  Treatment: D50 30ml  Symptoms: asymptomatic  Follow-up CBG: Time:0756 CBG Result: 90  Possible Reasons for Event: Hypoglycemic on admission, currently D10 IV infusion increased  Comments/MD notified: MD Owens Shark (CCM)    Kendra Opitz  Remember to initiate Hypoglycemia Order Set & complete

## 2013-11-15 NOTE — Consult Note (Addendum)
WOC wound consult note Reason for Consult: Consult requested for buttocks areas.  Pt was down at home for unknown period of time and has been incontinent prior to admission.   Wound type: Buttocks were previously reported to be red, macerated, and excoriated.  Appearance has improved at this time; intact skin with no open wounds.  Partial thickness fissure  1X.1X.1cm to sacral area which is NOT a pressure ulcer; appearance consistent with constant moisture.  Right posterior thigh with full thickness abrasion of unknown etiology.  This is NOT a pressure ulcer; location and appearance is not consistent with this type of wound.  7X6X.1cm; 80% yellow, 20% red.  Mod amt yellow drainage, no odor.  Unable to prevent soiling R/T frequent incontinent stools.  Pt has a catheter at this time to control urinary incontinence. Perineum and abd folds with red, macerated areas and patchy areas of partial thickness skin loss; appearance consistent with intertrigo. Painful to touch. Toes with eschar to tips in several locations, no odor or drainage.  It is best practice to leave dry stable eschar intact; no topical treatment indicated at this time. Dressing procedure/placement/frequency: Interdry silver-impregnated fabric to wick moisture away from abd skin folds and provide antimicrobial benefits.  This should remain in place for 5 days to promote optimal healing.  Supplies ordered and directions provided for staff nurse use.  Barrier cream to posterior thigh wound to protect from stool and promote healing. Will not follow patient;  if wound declines, then please reconsult WOC service for further plan of care at that time. Julien Girt MSN, RN, [place], [place], [place]

## 2013-11-15 NOTE — Procedures (Signed)
Central Venous Catheter Insertion Procedure Note  Tara Huff [id number] 11/15/2013  Procedure: Insertion of Central Venous Catheter   Indications: Drug and/or fluid administration   Procedure Details  Consent: Risks of procedure as well as the alternatives and risks of each were explained to the (patient/caregiver). Consent for procedure obtained.   Time Out: Verified patient identification, verified procedure, site/side was marked, verified correct patient position, special equipment/implants available, medications/allergies/relevent history reviewed, required imaging and test results available. Performed   Maximum sterile technique was used including antiseptics, cap, gloves, gown, hand hygiene, mask and sheet.  Skin prep: Chlorhexidine; local anesthetic administered  An antimicrobial bonded/coated triple lumen catheter was placed in the Left IJ vein, and sutured in place, with overlying bandage placed.  Ultrasound used to identify the vein.  Evaluation  Blood flow good  Complications: No apparent complications  Patient did tolerate procedure well.  Chest X-ray ordered to verify placement. CXR: pending.  Signed, Elnora Morrison, MD PGY-3, Internal Medicine Pgr: [phone] 11/15/2013, 11:48 AM  Attending: I supervised the procedure.  Jillyn Hidden PCCM Pager: [phone] Cell: [phone] If no response, call [phone]

## 2013-11-15 NOTE — Progress Notes (Signed)
INITIAL NUTRITION ASSESSMENT  DOCUMENTATION CODES Per approved criteria  -Obesity Unspecified   INTERVENTION: Advance diet as medically appropriate, add interventions accordingly RD to follow for nutrition care plan  NUTRITION DIAGNOSIS: Inadequate oral intake related to inability to eat as evidenced by NPO status  Goal: Pt to meet >/= 90% of their estimated nutrition needs   Monitor:  PO diet advancement & intake, weight, labs, I/O's  Reason for Assessment: Malnutrition Screening Tool Report  80 y.o. female  Admitting Dx: bradycardic arrest, probable sepsis  ASSESSMENT: Patient with PMH of DM, CHF, severe OSA and A-fib; s/p bradycardic arrest in setting of metabolic acidosis and probable sepsis; patient underwent transvenous pacemaker placement.  RD unable to obtain nutrition hx at this time; patient with hospitalization in June 2014 -- seen per Clinical Nutrition; patient with hx of decrease in appetite with mixed emotional eating; CWOCN note reviewed for partial thickness fissure to sacral area (not a pressure ulcer); + suspected cellulitis/infection to R posteior thigh; patient will more than likely benefit from nutrition supplements when/as able.  Height: Ht Readings from Last 1 Encounters:  11/14/13 5\' 8"  (1.727 m)    Weight: Wt Readings from Last 1 Encounters:  11/15/13 255 lb 11.7 oz (116 kg)    Ideal Body Weight: 140 lb  % Ideal Body Weight: 182%  Wt Readings from Last 10 Encounters:  11/15/13 255 lb 11.7 oz (116 kg)  11/15/13 255 lb 11.7 oz (116 kg)  05/02/13 260 lb 12.8 oz (118.298 kg)  08/11/12 233 lb 8 oz (105.915 kg)  03/24/12 276 lb 0.3 oz (125.2 kg)  09/01/11 260 lb (117.935 kg)  04/29/10 280 lb (127.007 kg)    Usual Body Weight: 260 lb  % Usual Body Weight: 98%  BMI:  Body mass index is 38.89 kg/(m^2).  Estimated Nutritional Needs: Kcal: 1800-2000 Protein: 95-105 gm Fluid: 1.8-2.0 L  Skin: partial thickness fissure to sacral  area  Diet Order: NPO  EDUCATION NEEDS: -No education needs identified at this time   Intake/Output Summary (Last 24 hours) at 11/15/13 1417 Last data filed at 11/15/13 1200  Gross per 24 hour  Intake 4086.6 ml  Output   2102 ml  Net 1984.6 ml    Labs:   Recent Labs Lab 11/14/13 0853 11/14/13 1230 11/14/13 2202 11/15/13 1145  NA 135*  --  136* 141  K 5.3  --  5.3 5.2  CL 113*  --  105 106  CO2  --   --  16* 21  BUN 57*  --  61* 60*  CREATININE 2.50* 2.44* 2.40* 2.49*  CALCIUM  --   --  8.1* 7.8*  GLUCOSE 286*  --  44* 106*    CBG (last 3)   Recent Labs  11/15/13 0235 11/15/13 0405 11/15/13 0622  GLUCAP 90 51* 40*    Scheduled Meds: . Chlorhexidine Gluconate Cloth  6 each Topical Q0600  . fluconazole (DIFLUCAN) IV  200 mg Intravenous Q24H  . insulin aspart  2-6 Units Subcutaneous Q4H  . mupirocin ointment  1 application Nasal BID  . pantoprazole (PROTONIX) IV  40 mg Intravenous Q24H  . piperacillin-tazobactam (ZOSYN)  IV  3.375 g Intravenous Q8H  . [START ON 11/16/2013] vancomycin  1,500 mg Intravenous Q48H    Continuous Infusions: . dextrose 60 mL/hr at 11/15/13 1200  . dextrose 5 % and 0.45% NaCl 1,000 mL with sodium bicarbonate 75 mEq infusion    . norepinephrine (LEVOPHED) Adult infusion 8 mcg/min (11/15/13 1200)  .  phenylephrine (NEO-SYNEPHRINE) Adult infusion      Past Medical History  Diagnosis Date  . Hypertension   . Atrial fibrillation   . MI (myocardial infarction)   . Anxiety   . Hypercholesterolemia   . Shoulder pain   . Sleep apnea     intolerant to CPAP  . Type 2 diabetes mellitus   . OSA (obstructive sleep apnea)   . Hypertensive heart disease   . Morbid obesity   . Pyogenic granuloma   . Cancer     cervical  . Depression   . Pneumonia   . CHF (congestive heart failure)     Past Surgical History  Procedure Laterality Date  . Knee surgery      at age 84  . Cataract surgery      both eyes  . Abdominal hysterectomy     . Toe amputation      Arthur Holms, RD, LDN Pager #: [phone] After-Hours Pager #: [phone]

## 2013-11-15 NOTE — Progress Notes (Addendum)
CARDIOLOGY Progress  NOTE     Patient ID: Tara Huff MRN: [id number] DOB/AGE: 78-Mar-1935 78 y.o.  Admit date: 11/14/2013 Referring Physician Mila Palmer MD Primary Physician Jeremy Johann MD Primary Cardiologist Fransico Him MD Reason for Consultation cardiac arrest with bradycardia, complete heart block.  HPI:  Tara Huff is seen at the request of the ED for cardiac evaluation post cardiac arrest with bradycardia. The patient is a chronically ill 80 yo WF with history of Atrial fibrillation, DM, right heart failure, and pulmonary HTN. The patient is unable to give history and there is no family available to give history. According to EMS patient has had slurred speech for several months. Today she was noted to have acutely altered mental status and EMS was called. On arrival patient was noted to be markedly bradycardic. While transporting the patient became unresponsive and lost her pulse. CPR was initiated and she was given IV epinephrine. She was paced transcutaneously with return of blood pressure. Patient is awake and alert and breathing spontaneously. She is completely pacemaker dependent. She was last admitted in June 2014 with Afib with RVR and E. Coli UTI. She also had significant right heart failure. She was in a nursing facility for 90 days then DC home. She has a history of severe OSA, morbid obesity, and intolerant of CPAP. Is not anticoagulated for afib. On her last hosptalization she was DNR/DNI per her request.  This am, she is in rapid atrial fib.    Past Medical History  Diagnosis Date  . Hypertension   . Atrial fibrillation   . MI (myocardial infarction)   . Anxiety   . Hypercholesterolemia   . Shoulder pain   . Sleep apnea     intolerant to CPAP  . Type 2 diabetes mellitus   . OSA (obstructive sleep apnea)   . Hypertensive heart disease   . Morbid obesity   . Pyogenic granuloma   . Cancer     cervical  . Depression   . Pneumonia   . CHF (congestive heart  failure)     Family History  Problem Relation Age of Onset  . Emphysema Father   . Cancer Mother     intestinal    History   Social History  . Marital Status: Married    Spouse Name: N/A    Number of Children: 1  . Years of Education: N/A   Occupational History  . retired    Social History Main Topics  . Smoking status: Former Smoker -- 1.00 packs/day for 20 years    Types: Cigarettes    Quit date: 11/09/1992  . Smokeless tobacco: Never Used  . Alcohol Use: No  . Drug Use: No  . Sexual Activity: No   Other Topics Concern  . Not on file   Social History Narrative  . No narrative on file    Past Surgical History  Procedure Laterality Date  . Knee surgery      at age 31  . Cataract surgery      both eyes  . Abdominal hysterectomy    . Toe amputation       Prescriptions prior to admission  Medication Sig Dispense Refill  . diltiazem (CARDIZEM CD) 180 MG 24 hr capsule TAKE ONE CAPSULE BY MOUTH EVERY DAY  90 capsule  1  . furosemide (LASIX) 80 MG tablet Take 0.5 tablets (40 mg total) by mouth 3 (three) times daily.  90 tablet  3  . metoprolol (LOPRESSOR) 50 MG  tablet TAKE 1/2 TABLET TWICE A DAY ORALLY  90 tablet  3  . potassium chloride SA (K-DUR,KLOR-CON) 20 MEQ tablet Take 20 mEq by mouth 2 (two) times daily.       . sertraline (ZOLOFT) 100 MG tablet Take 100 mg by mouth at bedtime.        Marland Kitchen spironolactone (ALDACTONE) 25 MG tablet TAKE 1 TABLET BY MOUTH ONCE A DAY  30 tablet  6  . acetaminophen (TYLENOL) 500 MG tablet Take 1,000 mg by mouth 2 (two) times daily.      Marland Kitchen ALPRAZolam (XANAX) 0.25 MG tablet Take 0.25 mg by mouth at bedtime as needed for sleep. For anxiety      . aspirin EC 81 MG EC tablet Take 1 tablet (81 mg total) by mouth daily.  30 tablet  3  . Cholecalciferol (VITAMIN D) 1000 UNITS capsule Take 1,000 Units by mouth daily.        . enalapril (VASOTEC) 2.5 MG tablet Take 1 tablet (2.5 mg total) by mouth 2 (two) times daily.  60 tablet  3  .  glimepiride (AMARYL) 2 MG tablet Take 1 tablet (2 mg total) by mouth daily before breakfast.  30 tablet  3  . Lancets (FREESTYLE) lancets Use as instructed  100 each  12  . levofloxacin (LEVAQUIN) 750 MG tablet Take 1 tablet (750 mg total) by mouth daily. X 12 days  12 tablet  0  . loperamide (IMODIUM) 2 MG capsule Take 1 capsule (2 mg total) by mouth as needed for diarrhea or loose stools. Also available over the counter  30 capsule  0  . metFORMIN (GLUCOPHAGE XR) 500 MG 24 hr tablet Take 1 tablet (500 mg total) by mouth daily with supper.  30 tablet  3  . saccharomyces boulardii (FLORASTOR) 250 MG capsule Take 1 capsule (250 mg total) by mouth 2 (two) times daily.  30 capsule  0    Physical Exam: Blood pressure 132/62, pulse 122, temperature 99 F (37.2 C), temperature source Core (Comment), resp. rate 17, height 5\' 8"  (1.727 m), weight 255 lb 11.7 oz (116 kg), SpO2 100.00%. She is a morbidly obese WF, minimally responseive HEENT: [place]/AT, PERRLA. Sclera are clear. Oropharynx is clear.  Neck: Severe JVD to the jaw. No carotid bruits. No adenopathy or thyromegaly. Lungs: clear anteriorly CV: irreg irreg.  tachy Abdomen: morbidly obese with large pannus. Soft nontender. BS audible. No obvious masses. Extremities: Cool and clammy, 2+ pretibial edema with severe hyperkeratotic skin changes.  Temp pacer in RFV.   Several ulcerated sores on her toes bilaterally. The mid toe on her left foot is amputated. Skin: Severe red rash in the pelvic/groin regions c/w yeast infection. There is a large decubitus ulcer on her left buttocks. Neuro: patient is alert, oriented x 2. Moves extremities.  Labs:   Lab Results  Component Value Date   WBC 9.0 11/15/2013   HGB 11.8* 11/15/2013   HCT 33.1* 11/15/2013   MCV 88.5 11/15/2013   PLT 111* 11/15/2013     Recent Labs Lab 11/14/13 2202  NA 136*  K 5.3  CL 105  CO2 16*  BUN 61*  CREATININE 2.40*  CALCIUM 8.1*  GLUCOSE 44*   Lab Results  Component Value  Date   CKTOTAL 72 03/20/2012   CKMB 2.7 03/20/2012   TROPONINI <0.30 11/14/2013    Lab Results  Component Value Date   CHOL 87 03/20/2012   CHOL  Value: 92  ATP III CLASSIFICATION:  <200     mg/dL   Desirable  200-239  mg/dL   Borderline High  >=240    mg/dL   High        07/31/2010   Lab Results  Component Value Date   HDL 31* 03/20/2012   HDL 29* 07/31/2010   Lab Results  Component Value Date   LDLCALC 47 03/20/2012   LDLCALC  Value: 53        Total Cholesterol/HDL:CHD Risk Coronary Heart Disease Risk Table                     Men   Women  1/2 Average Risk   3.4   3.3  Average Risk       5.0   4.4  2 X Average Risk   9.6   7.1  3 X Average Risk  23.4   11.0        Use the calculated Patient Ratio above and the CHD Risk Table to determine the patient's CHD Risk.        ATP III CLASSIFICATION (LDL):  <100     mg/dL   Optimal  100-129  mg/dL   Near or Above                    Optimal  130-159  mg/dL   Borderline  160-189  mg/dL   High  >190     mg/dL   Very High 07/31/2010   Lab Results  Component Value Date   TRIG 47 03/20/2012   TRIG 52 07/31/2010   Lab Results  Component Value Date   CHOLHDL 2.8 03/20/2012   CHOLHDL 3.2 07/31/2010   No results found for this basename: LDLDIRECT      Radiology: pending Tele:  A-fib with RVR.   Echo: 04/27/13: [facility]* [address] [place], [place] [id number] [phone]  ------------------------------------------------------------ Transthoracic Echocardiography   ------------------------------------------------------------ LV EF: 55% - 60%   - Left ventricle: The cavity size was normal. There was mild concentric hypertrophy. Systolic function was normal. The estimated ejection fraction was in the range of 55% to 60%. - Aorta: There is evidence of an aortic aneurysm in the descending aorta measureing 5.46cm in diameter. - Mitral valve: Mild regurgitation. - Left atrium: The atrium was moderately  dilated. - Right ventricle: The cavity size was mildly dilated. Wall thickness was normal. - Right atrium: The atrium was severely dilated. - Tricuspid valve: Moderate regurgitation. - Pulmonary arteries: PA peak pressure: 28mm Hg (S). Impressions:  - The right ventricular systolic pressure was increased consistent with mild pulmonary hypertension.   ASSESSMENT AND PLAN:  1. Complete heart block with atrial fibrillation. Patient is on chronic beta blocker and diltiazem for rate control. I think heart block is probably reltated to multiple acute metabolic derangements with severe hypothermia, renal failure, hyperkalemia, metabolic acidosis and possible sepsis. She was  Hypotensive but now her BP and HR have recovered.  Can DC temp pacing wire today.  At home, she was on Dilt 180 a day and metoprolol 25 BID.  Her HR is now high and BP has normalized.  Would probably start Metoprolol back later today or tomorrow.   Ok to transfer to step down when OK with PCCM.    2. Hypothermia 3. Metabolic acidosis. 4. Acute renal failure.  5. Shock-most likely related to sepsis. Multiple potential sources of infection including UTI, sacral decubitus, pulmonary.  6.  Chronic right heart failure. 7. Morbid obesity with OSA. 8. Stasis ulcers of toes. 9. DM type 2.  10. Yeast infection-groin. 11. Decubitus ulcer- buttocks.  Further recs per PCCM.  The patient should be transferred to Hospitalist's service once she is stable from [facility] standpoint.   Thayer Headings, Brooke Bonito., MD, [facility] 11/15/2013, 9:45 AM Office - [phone] Pager [phone]

## 2013-11-16 DIAGNOSIS — I10 Essential (primary) hypertension: Secondary | ICD-10-CM

## 2013-11-16 LAB — GLUCOSE, CAPILLARY
GLUCOSE-CAPILLARY: 86 mg/dL (ref 70–99)
GLUCOSE-CAPILLARY: 111 mg/dL — AB (ref 70–99)
GLUCOSE-CAPILLARY: 115 mg/dL — AB (ref 70–99)
Glucose-Capillary: 119 mg/dL — ABNORMAL HIGH (ref 70–99)
Glucose-Capillary: 104 mg/dL — ABNORMAL HIGH (ref 70–99)
Glucose-Capillary: 120 mg/dL — ABNORMAL HIGH (ref 70–99)

## 2013-11-16 LAB — CBC
HCT: 31.6 % — ABNORMAL LOW (ref 36.0–46.0)
Hemoglobin: 11.1 g/dL — ABNORMAL LOW (ref 12.0–15.0)
MCH: 31.6 pg (ref 26.0–34.0)
MCHC: 35.1 g/dL (ref 30.0–36.0)
MCV: 90 fL (ref 78.0–100.0)
PLATELETS: 86 10*3/uL — AB (ref 150–400)
RBC: 3.51 MIL/uL — ABNORMAL LOW (ref 3.87–5.11)
RDW: 16.7 % — AB (ref 11.5–15.5)
WBC: 7.4 10*3/uL (ref 4.0–10.5)

## 2013-11-16 LAB — BASIC METABOLIC PANEL
BUN: 52 mg/dL — ABNORMAL HIGH (ref 6–23)
CALCIUM: 8.2 mg/dL — AB (ref 8.4–10.5)
CO2: 22 mEq/L (ref 19–32)
Chloride: 105 mEq/L (ref 96–112)
Creatinine, Ser: 2.3 mg/dL — ABNORMAL HIGH (ref 0.50–1.10)
GFR, EST AFRICAN AMERICAN: 22 mL/min — AB (ref >90)
GFR, EST NON AFRICAN AMERICAN: 19 mL/min — AB (ref >90)
GLUCOSE: 119 mg/dL — AB (ref 70–99)
POTASSIUM: 4.8 meq/L (ref 3.7–5.3)
Sodium: 140 mEq/L (ref 137–147)

## 2013-11-16 MED ORDER — ACETAMINOPHEN 325 MG PO TABS
650.0000 mg | ORAL_TABLET | Freq: Four times a day (QID) | ORAL | Status: DC | PRN
  Administered 2013-11-16 (×2): 650 mg via ORAL
  Filled 2013-11-16 (×2): qty 2

## 2013-11-16 MED ORDER — PHENYLEPHRINE HCL 10 MG/ML IJ SOLN
30.0000 ug/min | INTRAVENOUS | Status: DC
  Administered 2013-11-16: 50 ug/min via INTRAVENOUS
  Filled 2013-11-16 (×2): qty 2

## 2013-11-16 MED ORDER — PHENYLEPHRINE HCL 10 MG/ML IJ SOLN
30.0000 ug/min | INTRAVENOUS | Status: DC
  Administered 2013-11-16: 80 ug/min via INTRAVENOUS
  Administered 2013-11-16: 110 ug/min via INTRAVENOUS
  Administered 2013-11-17: 20 ug/min via INTRAVENOUS
  Filled 2013-11-16 (×3): qty 4

## 2013-11-16 MED ORDER — SODIUM CHLORIDE 0.9 % IV SOLN: INTRAVENOUS | Status: DC

## 2013-11-16 MED ORDER — DEXTROSE 10 % IV SOLN
INTRAVENOUS | Status: AC
  Administered 2013-11-16 – 2013-11-17 (×2): via INTRAVENOUS

## 2013-11-16 MED ORDER — CHLORHEXIDINE GLUCONATE 0.12 % MT SOLN
15.0000 mL | Freq: Two times a day (BID) | OROMUCOSAL | Status: DC
  Administered 2013-11-16: 15 mL via OROMUCOSAL
  Filled 2013-11-16: qty 15

## 2013-11-16 MED ORDER — BIOTENE DRY MOUTH MT LIQD
15.0000 mL | Freq: Four times a day (QID) | OROMUCOSAL | Status: DC
  Administered 2013-11-16 – 2013-11-24 (×21): 15 mL via OROMUCOSAL

## 2013-11-16 NOTE — Procedures (Signed)
Arterial Catheter Insertion Procedure Note Tara Huff [id number] 01-Jun-1934  Procedure: Insertion of Arterial Catheter  Indications: Blood pressure monitoring  Procedure Details Consent: Risks of procedure as well as the alternatives and risks of each were explained to the (patient/caregiver).  Consent for procedure obtained. Time Out: Verified patient identification, verified procedure, site/side was marked, verified correct patient position, special equipment/implants available, medications/allergies/relevent history reviewed, required imaging and test results available.  Performed  Maximum sterile technique was used including gloves, gowns, caps, chlorhexadine skin prep, drape, all sterile. 20 gauge catheter was inserted into left radial artery using the Seldinger technique.  Evaluation Blood flow good; BP tracing good. Complications: No apparent complications.   Malachi Paradise 11/16/2013

## 2013-11-16 NOTE — Evaluation (Signed)
Clinical/Bedside Swallow Evaluation Patient Details  Name: Tara Huff MRN: [id number] Date of Birth: May 20, 1934  Today's Date: 11/16/2013 Time: 1035-1100 SLP Time Calculation (min): 25 min  Past Medical History:  Past Medical History  Diagnosis Date  . Hypertension   . Atrial fibrillation   . MI (myocardial infarction)   . Anxiety   . Hypercholesterolemia   . Shoulder pain   . Sleep apnea     intolerant to CPAP  . Type 2 diabetes mellitus   . OSA (obstructive sleep apnea)   . Hypertensive heart disease   . Morbid obesity   . Pyogenic granuloma   . Cancer     cervical  . Depression   . Pneumonia   . CHF (congestive heart failure)    Past Surgical History:  Past Surgical History  Procedure Laterality Date  . Knee surgery      at age 38  . Cataract surgery      both eyes  . Abdominal hysterectomy    . Toe amputation     HPI:  80 year old female admitted 11/14/13 due to AMS.  PMH significant for AFib, DM, OSA, CHF, Fall, HTN.     Assessment / Plan / Recommendation Clinical Impression  Oral care completed with suction.  Pt's lips were very dry and cracked, with dried skin apparent.  Oral strength and function appear adequate.  No overt s/s aspiration observed on any consistency tested, although presentations were limited due to pt refusal for additional boluses.      Aspiration Risk  Mild    Diet Recommendation Dysphagia 3 (Mechanical Soft);Thin liquid   Liquid Administration via: Straw;Cup Medication Administration: Whole meds with puree Supervision: Full supervision/cueing for compensatory strategies Compensations: Slow rate;Small sips/bites Postural Changes and/or Swallow Maneuvers: Out of bed for meals    Other  Recommendations Oral Care Recommendations: Oral care Q4 per protocol Other Recommendations: Have oral suction available;Clarify dietary restrictions   Follow Up Recommendations  24 hour supervision/assistance    Frequency and Duration         Pertinent Vitals/Pain No pain reported    SLP Swallow Goals  n/a       Swallow Study Prior Functional Status   Pt reported tolerating regular diet prior to admit    General Date of Onset: 11/14/13 HPI: 80 year old female admitted 11/14/13 due to AMS.  PMH significant for AFib, DM, OSA, CHF, Fall, HTN.   Type of Study: Bedside swallow evaluation Previous Swallow Assessment: none found Diet Prior to this Study: NPO Temperature Spikes Noted: No Respiratory Status: Room air History of Recent Intubation: No Behavior/Cognition: Alert;Cooperative;Pleasant mood Oral Cavity - Dentition: Adequate natural dentition Self-Feeding Abilities: Needs assist;Needs set up Patient Positioning: Upright in bed Baseline Vocal Quality: Clear Volitional Cough: Weak Volitional Swallow: Able to elicit    Oral/Motor/Sensory Function Overall Oral Motor/Sensory Function: Appears within functional limits for tasks assessed Labial ROM: Within Functional Limits Labial Symmetry: Within Functional Limits Labial Strength: Within Functional Limits Labial Sensation: Within Functional Limits Lingual ROM: Within Functional Limits Lingual Symmetry: Within Functional Limits Lingual Strength: Within Functional Limits Lingual Sensation: Within Functional Limits Facial ROM: Within Functional Limits Facial Symmetry: Within Functional Limits Facial Strength: Within Functional Limits Facial Sensation: Within Functional Limits Velum: Within Functional Limits Mandible: Within Functional Limits   Ice Chips Ice chips: Not tested   Thin Liquid Thin Liquid: Within functional limits Presentation: Straw    Nectar Thick Nectar Thick Liquid: Not tested   Honey Thick Honey  Thick Liquid: Not tested   Puree Puree: Within functional limits Presentation: Spoon   Solid   GO    Solid: Within functional limits      Juliane Guest B. [place], [place], [place]   Shonna Chock 11/16/2013,11:05 AM

## 2013-11-16 NOTE — Progress Notes (Signed)
PULMONARY / CRITICAL CARE MEDICINE  Name: Tara Huff MRN: [id number] DOB: April 28, 1934    ADMISSION DATE:  11/14/2013 CONSULTATION DATE:  11/14/2013  REFERRING MD :  Cardiology  PRIMARY SERVICE: PCCM  CHIEF COMPLAINT:  Bradycardic arrest. Probable sepsis   BRIEF PATIENT DESCRIPTION: 80 yo admitted 1/6 s/p bradycardic arrest in setting of metabolic acidosis and probable sepsis. Underwent transvenous pacemaker placement.  SIGNIFICANT EVENTS / STUDIES:  1/6  Cath lab >>> Transvenous pacer placement 1/6  LE venous Dopplers>>> prelim neg for DVT 1/6  TTE >>> EF 40%, LA mod dilated, RA severely dilated, mod-severe tricuspid regurg 1/7 - temp pacer removed 1/8 - mental status improved  LINES / TUBES: Right groin venous temp pacemaker 1/6 >>>1/7 L IJ CVL 1/7>>  CULTURES: 1/6 Blood culture >>>NGTD 1/6 Urine >>> 1/6 MRSA>>>pos  ANTIBIOTICS: Vancomycin 1/6 >>> Zosyn 1/6 >>> Diflucan 1/6 >>>  SUBJECTIVE:  The patient is more alert this morning, stating she is in a hospital in [place], and that it is December (close).  CBG's have improved on the D10.  She has had 3 L of UOP overnight, and Cr is improving.  Bicarb drip discontinued.  Patient still on Neo at 50.  HR 70's presently.  VITAL SIGNS: Temp:  [95.4 F (35.2 C)-99 F (37.2 C)] 97.7 F (36.5 C) (01/08 0700) Pulse Rate:  [68-122] 87 (01/08 0700) Resp:  [14-29] 17 (01/08 0700) BP: (93-132)/(44-77) 105/49 mmHg (01/08 0700) SpO2:  [94 %-100 %] 96 % (01/08 0700) Weight:  [253 lb 12 oz (115.1 kg)] 253 lb 12 oz (115.1 kg) (01/08 0400)  HEMODYNAMICS:   VENTILATOR SETTINGS:   INTAKE / OUTPUT: Intake/Output     01/07 0701 - 01/08 0700 01/08 0701 - 01/09 0700   I.V. (mL/kg) 2840 (24.7)    IV Piggyback 250    Total Intake(mL/kg) 3090 (26.8)    Urine (mL/kg/hr) 3060 (1.1)    Stool 1 (0)    Total Output 3061     Net +29          Stool Occurrence 4 x     PHYSICAL EXAMINATION: General: lying in bed, able to follow  commands, but confused HEENT: PERRL, EOMI, oropharynx clear and non-erythematous  Neck: supple Lungs: tachypneic, no wheezes, rales, or ronchi Heart: tachycardic, irregularly irregular rhythm Abdomen: soft, non-tender, non-distended, normal bowel sounds. GU: Large area of erythema around groin and intertriginous areas, with wide ulcer on right posterior thigh. Extremities: 2+ pitting edema, multiple scattered white plaques on bilateral feet Neurologic: A&O to person, follows commands without difficulty, moves all 4 extremities  LABS:  CBC  Recent Labs Lab 11/15/13 0200 11/15/13 1145 11/16/13 0300  WBC 9.0 7.9 7.4  HGB 11.8* 11.9* 11.1*  HCT 33.1* 32.3* 31.6*  PLT 111* 96* 86*   Coag's  Recent Labs Lab 11/14/13 1230  INR 1.34   BMET  Recent Labs Lab 11/14/13 2202 11/15/13 1145 11/16/13 0300  NA 136* 141 140  K 5.3 5.2 4.8  CL 105 106 105  CO2 16* 21 22  BUN 61* 60* 52*  CREATININE 2.40* 2.49* 2.30*  GLUCOSE 44* 106* 119*   Electrolytes  Recent Labs Lab 11/14/13 2202 11/15/13 1145 11/16/13 0300  CALCIUM 8.1* 7.8* 8.2*   Sepsis Markers  Recent Labs Lab 11/14/13 1000 11/14/13 1001  LATICACIDVEN  --  2.1  PROCALCITON 0.24  --    ABG  Recent Labs Lab 11/14/13 0851  PHART 7.094*  PCO2ART 29.0*  PO2ART 145.0*   Liver Enzymes  Recent Labs Lab 11/15/13 1145  AST 34  ALT 17  ALKPHOS 133*  BILITOT 0.7  ALBUMIN 2.2*   Cardiac Enzymes  Recent Labs Lab 11/14/13 1002 11/14/13 1602 11/14/13 2202  TROPONINI <0.30 <0.30 <0.30  PROBNP 1616.0*  --   --    Glucose  Recent Labs Lab 11/15/13 1250 11/15/13 1654 11/15/13 1938 11/15/13 2338 11/16/13 0320 11/16/13 0748  GLUCAP 121* 116* 124* 112* 119* 104*    Imaging Dg Chest Port 1 View  11/15/2013   CLINICAL DATA:  Central line placement assessment  EXAM: PORTABLE CHEST - 1 VIEW  COMPARISON:  Portable chest x-ray of November 14, 2013  FINDINGS: The patient has undergone interval  placement of a left internal jugular venous catheter. The tip of the catheter lies in the region of the midportion of the SVC. There is no evidence of a postprocedure pneumothorax or hemothorax. The cardiopericardial silhouette remains enlarged. The pulmonary vascularity remains engorged. The pulmonary interstitial markings are more prominent diffusely. There is no pleural effusion.  IMPRESSION: There is no evidence of a postprocedure complication following placement of the left internal jugular venous catheter. There are findings consistent with CHF with interstitial edema.   Electronically Signed   By: David  Martinique   On: 11/15/2013 13:01   Dg Chest Port 1 View  11/14/2013   CLINICAL DATA:  Cardiac arrhythmia  EXAM: PORTABLE CHEST - 1 VIEW  COMPARISON:  Study obtained earlier in the day  FINDINGS: There is a pacemaker lead in the right ventricular region. No pneumothorax.  There is no edema or consolidation. Heart is enlarged with pulmonary vascularity within normal limits. No adenopathy. There is arthropathy in both shoulders.  IMPRESSION: Stable cardiomegaly. No frank edema or consolidation. Pacemaker lead in region of right ventricle. No pneumothorax.   Electronically Signed   By: Lowella Grip M.D.   On: 11/14/2013 19:27   Dg Chest Port 1 View  11/14/2013   CLINICAL DATA:  80 year old female. Preoperative study, pacemaker placement. Initial encounter.  EXAM: PORTABLE CHEST - 1 VIEW  COMPARISON:  CT ANGIO CHEST AORTA W/CM \\T \/OR WO/CM dated 05/01/2013; DG CHEST 1V PORT dated 04/27/2013  FINDINGS: There is cardiomegaly. Other mediastinal contours are within normal limits. The patient is rotated to the right. No pneumothorax, pleural effusion or consolidation identified. Continued pulmonary vascular congestion, appears increased compared to 04/27/2013.  IMPRESSION: Cardiomegaly and pulmonary vascular congestion / interstitial edema which appears increased compared to 04/27/2013. No pneumonia identified.    Electronically Signed   By: Lars Pinks M.D.   On: 11/14/2013 11:23    ASSESSMENT / PLAN:  PULMONARY A:  H/o OHS. P:   -Goal SpO2>92 -Supplemental oxygen PRN -DNI  CARDIOVASCULAR A:  Bradycardic cardiac arrest. - likely secondary to metabolic disturbances. Now resolved, temp pacer removed 1/7 Afib Shock, likely septic. P:  -hold all nodal blocking agents for now -wean Neo as tolerated to MAP goal > 60 -DNR  RENAL A:   Acute renal failure - likely 2/2 ATN from septic shock/cardiac arrest, improving Metabolic acidosis. P:   -Trend BMP -bicarb drip d/c'ed yesterday -Hold ACEI  GASTROINTESTINAL A:   Nutrition. GI Px.  P:   -swallow study today -hopefully can start diet today; d/c D10 drip after diet initiated -Protonix  HEMATOLOGIC A:   Anemia.  Thrombocytopenia. VTE Px. P:  -Trend CBC -Transfuse for goal Hb > 7 -Hold Heparin and ASA -SCDs  INFECTIOUS A:   Suspected UTI - pyuria on UA, awaiting culture Suspected  cellulitis / decubitus infection - wide superficial ulcer on R posterior thigh Candida dermatitis  P:   -Cx / abx as above  ENDOCRINE A:   DM. Hypoglycemia - likely due to sepsis P:   -SSI -D10 drip at 30 cc/hr -swallow study, try to advance diet, can likely d/c D10 when pt eats  NEUROLOGIC A:   Acute encephalopathy - likely due to medical illness, but improving P:   Monitor clinically   Elnora Morrison, PGY3 Pgr. [phone] 11/16/2013, 8:39 AM  Attending:  I have seen and examined the patient with nurse practitioner/resident and agree with the note above.   Still on pressors; we assume the source is cellulitis as we have not found anything else yet  Dementia? Clearly has delirium  Need to meet husband today and discuss goals of care  I have personally obtained a history, examined the patient, evaluated laboratory and imaging results, formulated the assessment and plan and placed orders.  CRITICAL CARE: The patient is critically  ill with multiple organ systems failure and requires high complexity decision making for assessment and support, frequent evaluation and titration of therapies, application of advanced monitoring technologies and extensive interpretation of multiple databases. Critical Care Time devoted to patient care services described in this note is 35 minutes.   Jillyn Hidden PCCM Pager: [phone] Cell: [phone] If no response, call [phone]

## 2013-11-16 NOTE — Progress Notes (Signed)
CARDIOLOGY Progress  NOTE     Patient ID: Tara Huff MRN: [id number] DOB/AGE: 03-05-1934 80 y.o.  Admit date: 11/14/2013 Referring Physician Mila Palmer MD Primary Physician Jeremy Johann MD Primary Cardiologist Fransico Him MD Reason for Consultation cardiac arrest with bradycardia, complete heart block.  HPI:  Tara Huff is seen at the request of the ED for cardiac evaluation post cardiac arrest with bradycardia. The patient is a chronically ill 80 yo WF with history of Atrial fibrillation, DM, right heart failure, and pulmonary HTN. The patient is unable to give history and there is no family available to give history. According to EMS patient has had slurred speech for several months. Today she was noted to have acutely altered mental status and EMS was called. On arrival patient was noted to be markedly bradycardic. While transporting the patient became unresponsive and lost her pulse. CPR was initiated and she was given IV epinephrine. She was paced transcutaneously with return of blood pressure. Patient is awake and alert and breathing spontaneously. She is completely pacemaker dependent. She was last admitted in June 2014 with Afib with RVR and E. Coli UTI. She also had significant right heart failure. She was in a nursing facility for 90 days then DC home. She has a history of severe OSA, morbid obesity, and intolerant of CPAP. Is not anticoagulated for afib. On her last hosptalization she was DNR/DNI per her request.  She remains in AFib - rate   mid 80s.  Past Medical History  Diagnosis Date  . Hypertension   . Atrial fibrillation   . MI (myocardial infarction)   . Anxiety   . Hypercholesterolemia   . Shoulder pain   . Sleep apnea     intolerant to CPAP  . Type 2 diabetes mellitus   . OSA (obstructive sleep apnea)   . Hypertensive heart disease   . Morbid obesity   . Pyogenic granuloma   . Cancer     cervical  . Depression   . Pneumonia   . CHF (congestive heart  failure)     Family History  Problem Relation Age of Onset  . Emphysema Father   . Cancer Mother     intestinal    History   Social History  . Marital Status: Married    Spouse Name: N/A    Number of Children: 1  . Years of Education: N/A   Occupational History  . retired    Social History Main Topics  . Smoking status: Former Smoker -- 1.00 packs/day for 20 years    Types: Cigarettes    Quit date: 11/09/1992  . Smokeless tobacco: Never Used  . Alcohol Use: No  . Drug Use: No  . Sexual Activity: No   Other Topics Concern  . Not on file   Social History Narrative  . No narrative on file    Past Surgical History  Procedure Laterality Date  . Knee surgery      at age 80  . Cataract surgery      both eyes  . Abdominal hysterectomy    . Toe amputation       Prescriptions prior to admission  Medication Sig Dispense Refill  . diltiazem (CARDIZEM CD) 180 MG 24 hr capsule TAKE ONE CAPSULE BY MOUTH EVERY DAY  90 capsule  1  . furosemide (LASIX) 80 MG tablet Take 0.5 tablets (40 mg total) by mouth 3 (three) times daily.  90 tablet  3  . metoprolol (LOPRESSOR) 50 MG  tablet TAKE 1/2 TABLET TWICE A DAY ORALLY  90 tablet  3  . potassium chloride SA (K-DUR,KLOR-CON) 20 MEQ tablet Take 20 mEq by mouth 2 (two) times daily.       . sertraline (ZOLOFT) 100 MG tablet Take 100 mg by mouth at bedtime.        Marland Kitchen spironolactone (ALDACTONE) 25 MG tablet TAKE 1 TABLET BY MOUTH ONCE A DAY  30 tablet  6  . acetaminophen (TYLENOL) 500 MG tablet Take 1,000 mg by mouth 2 (two) times daily.      Marland Kitchen ALPRAZolam (XANAX) 0.25 MG tablet Take 0.25 mg by mouth at bedtime as needed for sleep. For anxiety      . aspirin EC 81 MG EC tablet Take 1 tablet (81 mg total) by mouth daily.  30 tablet  3  . Cholecalciferol (VITAMIN D) 1000 UNITS capsule Take 1,000 Units by mouth daily.        . enalapril (VASOTEC) 2.5 MG tablet Take 1 tablet (2.5 mg total) by mouth 2 (two) times daily.  60 tablet  3  .  glimepiride (AMARYL) 2 MG tablet Take 1 tablet (2 mg total) by mouth daily before breakfast.  30 tablet  3  . Lancets (FREESTYLE) lancets Use as instructed  100 each  12  . levofloxacin (LEVAQUIN) 750 MG tablet Take 1 tablet (750 mg total) by mouth daily. X 12 days  12 tablet  0  . loperamide (IMODIUM) 2 MG capsule Take 1 capsule (2 mg total) by mouth as needed for diarrhea or loose stools. Also available over the counter  30 capsule  0  . metFORMIN (GLUCOPHAGE XR) 500 MG 24 hr tablet Take 1 tablet (500 mg total) by mouth daily with supper.  30 tablet  3  . saccharomyces boulardii (FLORASTOR) 250 MG capsule Take 1 capsule (250 mg total) by mouth 2 (two) times daily.  30 capsule  0    Physical Exam: Blood pressure 105/49, pulse 87, temperature 97.7 F (36.5 C), temperature source Oral, resp. rate 17, height 5\' 8"  (1.727 m), weight 253 lb 12 oz (115.1 kg), SpO2 96.00%. She is a morbidly obese WF, minimally responseive HEENT: [place]/AT, PERRLA. Sclera are clear. Oropharynx is clear.  Neck:  . No carotid bruits. No adenopathy or thyromegaly. Lungs: clear anteriorly CV: irreg irreg.   Abdomen: morbidly obese with large pannus. Soft nontender. BS audible. No obvious masses. Extremities: Cool and clammy, 1+ pretibial edema with severe hyperkeratotic skin changes.   Temp pacing wire has been removed.   Several ulcerated sores on her toes bilaterally. The mid toe on her left foot is amputated. Skin: Severe red rash in the pelvic/groin regions c/w yeast infection. There is a large decubitus ulcer on her left buttocks. Neuro: patient is alert, oriented x 2. Moves extremities.  Labs:   Lab Results  Component Value Date   WBC 7.4 11/16/2013   HGB 11.1* 11/16/2013   HCT 31.6* 11/16/2013   MCV 90.0 11/16/2013   PLT 86* 11/16/2013     Recent Labs Lab 11/15/13 1145 11/16/13 0300  NA 141 140  K 5.2 4.8  CL 106 105  CO2 21 22  BUN 60* 52*  CREATININE 2.49* 2.30*  CALCIUM 7.8* 8.2*  PROT 6.1  --   BILITOT  0.7  --   ALKPHOS 133*  --   ALT 17  --   AST 34  --   GLUCOSE 106* 119*   Lab Results  Component Value Date  CKTOTAL 72 03/20/2012   CKMB 2.7 03/20/2012   TROPONINI <0.30 11/14/2013    Lab Results  Component Value Date   CHOL 87 03/20/2012   CHOL  Value: 92        ATP III CLASSIFICATION:  <200     mg/dL   Desirable  200-239  mg/dL   Borderline High  >=240    mg/dL   High        07/31/2010   Lab Results  Component Value Date   HDL 31* 03/20/2012   HDL 29* 07/31/2010   Lab Results  Component Value Date   LDLCALC 47 03/20/2012   LDLCALC  Value: 53        Total Cholesterol/HDL:CHD Risk Coronary Heart Disease Risk Table                     Men   Women  1/2 Average Risk   3.4   3.3  Average Risk       5.0   4.4  2 X Average Risk   9.6   7.1  3 X Average Risk  23.4   11.0        Use the calculated Patient Ratio above and the CHD Risk Table to determine the patient's CHD Risk.        ATP III CLASSIFICATION (LDL):  <100     mg/dL   Optimal  100-129  mg/dL   Near or Above                    Optimal  130-159  mg/dL   Borderline  160-189  mg/dL   High  >190     mg/dL   Very High 07/31/2010   Lab Results  Component Value Date   TRIG 47 03/20/2012   TRIG 52 07/31/2010   Lab Results  Component Value Date   CHOLHDL 2.8 03/20/2012   CHOLHDL 3.2 07/31/2010   No results found for this basename: LDLDIRECT      Radiology: pending Tele:  A-fib with RVR.   Echo: 04/27/13: [facility]* [address], [place] [address] [phone]  ------------------------------------------------------------ Transthoracic Echocardiography   ------------------------------------------------------------ LV EF: 55% - 60%   - Left ventricle: The cavity size was normal. There was mild concentric hypertrophy. Systolic function was normal. The estimated ejection fraction was in the range of 55% to 60%. - Aorta: There is evidence of an aortic aneurysm in the descending aorta  measureing 5.46cm in diameter. - Mitral valve: Mild regurgitation. - Left atrium: The atrium was moderately dilated. - Right ventricle: The cavity size was mildly dilated. Wall thickness was normal. - Right atrium: The atrium was severely dilated. - Tricuspid valve: Moderate regurgitation. - Pulmonary arteries: PA peak pressure: 64mm Hg (S). Impressions:  - The right ventricular systolic pressure was increased consistent with mild pulmonary hypertension.   ASSESSMENT AND PLAN:  1. Complete heart block with atrial fibrillation. HR is now stable.  She is off beta blocker and dilt.  At this point, she is very stable.  I would continue to hold AV blocking agents for now.  She is a DNR.  2. Hypothermia 3. Metabolic acidosis. 4. Acute renal failure.  5. Shock-most likely related to sepsis. Multiple potential sources of infection including UTI, sacral decubitus, pulmonary.  6. Chronic right heart failure. 7. Morbid obesity with OSA. 8. Stasis ulcers of toes. 9. DM type 2.  10. Yeast infection-groin. 11. Decubitus ulcer-  buttocks.  Will sign off.  Call for questions.   Thayer Headings, Brooke Bonito., MD, [facility] 11/16/2013, 7:56 AM Office - [phone] Pager [phone]

## 2013-11-17 ENCOUNTER — Inpatient Hospital Stay (HOSPITAL_COMMUNITY): Payer: Medicare Other

## 2013-11-17 DIAGNOSIS — R5381 Other malaise: Secondary | ICD-10-CM

## 2013-11-17 DIAGNOSIS — R197 Diarrhea, unspecified: Secondary | ICD-10-CM

## 2013-11-17 LAB — CLOSTRIDIUM DIFFICILE BY PCR: CDIFFPCR: NEGATIVE

## 2013-11-17 LAB — TYPE AND SCREEN
ABO/RH(D): O POS
Antibody Screen: NEGATIVE
UNIT DIVISION: 0
Unit division: 0

## 2013-11-17 LAB — URINE CULTURE
Colony Count: >=100000
Special Requests: NORMAL

## 2013-11-17 LAB — CBC
HCT: 34 % — ABNORMAL LOW (ref 36.0–46.0)
Hemoglobin: 11.8 g/dL — ABNORMAL LOW (ref 12.0–15.0)
MCH: 31.7 pg (ref 26.0–34.0)
MCHC: 34.7 g/dL (ref 30.0–36.0)
MCV: 91.4 fL (ref 78.0–100.0)
PLATELETS: 84 10*3/uL — AB (ref 150–400)
RBC: 3.72 MIL/uL — ABNORMAL LOW (ref 3.87–5.11)
RDW: 16.5 % — AB (ref 11.5–15.5)
WBC: 11.1 10*3/uL — AB (ref 4.0–10.5)

## 2013-11-17 LAB — BASIC METABOLIC PANEL
BUN: 42 mg/dL — AB (ref 6–23)
CALCIUM: 8.7 mg/dL (ref 8.4–10.5)
CO2: 18 mEq/L — ABNORMAL LOW (ref 19–32)
CREATININE: 2.13 mg/dL — AB (ref 0.50–1.10)
Chloride: 103 mEq/L (ref 96–112)
GFR, EST AFRICAN AMERICAN: 25 mL/min — AB (ref >90)
GFR, EST NON AFRICAN AMERICAN: 21 mL/min — AB (ref >90)
Glucose, Bld: 107 mg/dL — ABNORMAL HIGH (ref 70–99)
Potassium: 5.2 mEq/L (ref 3.7–5.3)
Sodium: 136 mEq/L — ABNORMAL LOW (ref 137–147)

## 2013-11-17 LAB — PROCALCITONIN: PROCALCITONIN: 0.69 ng/mL

## 2013-11-17 LAB — CK: CK TOTAL: 32 U/L (ref 7–177)

## 2013-11-17 LAB — GLUCOSE, CAPILLARY
GLUCOSE-CAPILLARY: 103 mg/dL — AB (ref 70–99)
Glucose-Capillary: 99 mg/dL (ref 70–99)
Glucose-Capillary: 103 mg/dL — ABNORMAL HIGH (ref 70–99)

## 2013-11-17 LAB — LACTIC ACID, PLASMA: Lactic Acid, Venous: 0.8 mmol/L (ref 0.5–2.2)

## 2013-11-17 MED ORDER — CHLORHEXIDINE GLUCONATE 0.12 % MT SOLN
15.0000 mL | Freq: Two times a day (BID) | OROMUCOSAL | Status: DC
  Administered 2013-11-17 – 2013-11-24 (×11): 15 mL via OROMUCOSAL
  Filled 2013-11-17 (×15): qty 15

## 2013-11-17 MED ORDER — CHLORHEXIDINE GLUCONATE 0.12 % MT SOLN
OROMUCOSAL | Status: AC
  Filled 2013-11-17: qty 15

## 2013-11-17 NOTE — Progress Notes (Signed)
ANTIBIOTIC CONSULT NOTE   Pharmacy Consult for zosyn, vancomycin, fluconazole Indication: shock, cellulitis, UTI  No Known Allergies  Patient Measurements: Wt 116kg (estimated in cath lab)  Vital Signs: Temp: 98.8 F (37.1 C) (01/09 0600) Pulse Rate: 84 (01/09 0600) Intake/Output from previous day: 01/08 0701 - 01/09 0700 In: 2855.6 [P.O.:120; I.V.:1979.6; IV Piggyback:750] Out: 2505 [Urine:2505] Intake/Output from this shift:    Labs:  Recent Labs  11/15/13 1145 11/16/13 0300 11/17/13 0410  WBC 7.9 7.4 11.1*  HGB 11.9* 11.1* 11.8*  PLT 96* 86* 84*  CREATININE 2.49* 2.30* 2.13*   Estimated Creatinine Clearance: 29 ml/min (by C-G formula based on Cr of 2.13). No results found for this basename: VANCOTROUGH, VANCOPEAK, VANCORANDOM, [place], GENTPEAK, GENTRANDOM, TOBRATROUGH, TOBRAPEAK, TOBRARND, AMIKACINPEAK, AMIKACINTROU, AMIKACIN,  in the last 72 hours   Microbiology: Recent Results (from the past 720 hour(s))  CULTURE, BLOOD (ROUTINE X 2)     Status: None   Collection Time    11/14/13 12:31 PM      Result Value Range Status   Specimen Description BLOOD RIGHT HAND   Final   Special Requests BOTTLES DRAWN AEROBIC ONLY 10CC   Final   Culture  Setup Time     Final   Value: 11/14/2013 16:21     Performed at [facility]   Culture     Final   Value:        BLOOD CULTURE RECEIVED NO GROWTH TO DATE CULTURE WILL BE HELD FOR 5 DAYS BEFORE ISSUING A FINAL NEGATIVE REPORT     Performed at [facility]   Report Status PENDING   Incomplete  CULTURE, BLOOD (ROUTINE X 2)     Status: None   Collection Time    11/14/13 12:40 PM      Result Value Range Status   Specimen Description BLOOD LEFT HAND   Final   Special Requests BOTTLES DRAWN AEROBIC ONLY 5CC   Final   Culture  Setup Time     Final   Value: 11/14/2013 16:44     Performed at [facility]   Culture     Final   Value:        BLOOD CULTURE RECEIVED NO GROWTH TO DATE CULTURE WILL BE HELD  FOR 5 DAYS BEFORE ISSUING A FINAL NEGATIVE REPORT     Performed at [facility]   Report Status PENDING   Incomplete  URINE CULTURE     Status: None   Collection Time    11/14/13  2:16 PM      Result Value Range Status   Specimen Description URINE, CATHETERIZED   Final   Special Requests Normal   Final   Culture  Setup Time     Final   Value: 11/14/2013 20:29     Performed at [place]     Final   Value: >=100,000 COLONIES/ML     Performed at [facility]   Culture     Final   Value: [place]     Performed at [facility]   Report Status PENDING   Incomplete  MRSA PCR SCREENING     Status: Abnormal   Collection Time    11/14/13  2:17 PM      Result Value Range Status   MRSA by PCR POSITIVE (*) NEGATIVE Final   Comment:            The GeneXpert MRSA Assay (FDA  approved for NASAL specimens     only), is one component of a     comprehensive MRSA colonization     surveillance program. It is not     intended to diagnose MRSA     infection nor to guide or     monitor treatment for     MRSA infections.     RESULT CALLED TO, READ BACK BY AND VERIFIED WITH:     C. RICE RN 17:05 11/14/13 ([email or web address])   Assessment: 80 yo female with bradycardic arrest s/p temporary transvenous pacemaker placement. Patient to begin vancomycin/zosyn for concern of septic shock/cellulitis noted with possible sources UTI and decubitis ulcer.   Patient still febrile with tmax 101, wbc trending up at 11, and urine is preliminarily positive for GNR. Renal function slowly improving to 2.1 (was normal 6/14), uop 0.36ml/kg/hr. Patient's antibiotics have been dose adjusted given her weight and renal function, I do have some concern for vancomycin accumulation, will follow bmet in am and make decision regarding checking random level prior to noon redosing, she has only received two doses.  Goal of Therapy:  Vancomycin trough= 15-20  Plan:  -Vancomycin  1500mg  IV q48hr - consider random level prior to redosing tomorrow -Zosyn 3.375gm IV q8h -Fluconazole 200mg  IV daily -Will follow renal function, cultures and clinical progress   Erin Hearing PharmD., BCPS Clinical Pharmacist Pager [phone] 11/17/2013 8:57 AM

## 2013-11-17 NOTE — Progress Notes (Signed)
PULMONARY / CRITICAL CARE MEDICINE  Name: Tara Huff MRN: [id number] DOB: 1934-10-18    ADMISSION DATE:  11/14/2013 CONSULTATION DATE:  11/14/2013  REFERRING MD :  Cardiology  PRIMARY SERVICE: PCCM  CHIEF COMPLAINT:  Bradycardic arrest. Probable sepsis   BRIEF PATIENT DESCRIPTION: 80 yo admitted 1/6 s/p bradycardic arrest in setting of metabolic acidosis and probable sepsis. Underwent transvenous pacemaker placement.  SIGNIFICANT EVENTS / STUDIES:  1/6  Cath lab >>> Transvenous pacer placement 1/6  LE venous Dopplers>>> prelim neg for DVT 1/6  TTE >>> EF 40%, LA mod dilated, RA severely dilated, mod-severe tricuspid regurg 1/7 - temp pacer removed 1/8 - tolerating PO  LINES / TUBES: Right groin venous temp pacemaker 1/6 >>>1/7 L IJ CVL 1/7>>  CULTURES: 1/6 Blood culture >>>NGTD 1/6 Urine >>>100K colonies g- rods 1/6 MRSA>>>pos  ANTIBIOTICS: Vancomycin 1/6 >>> Zosyn 1/6 >>> Diflucan 1/6 >>>  SUBJECTIVE:  The patient spiked a fever overnight to 101.5, and WBC count increased to 11.1.  BP's have remained stable to Neosynephrine 40, but attempts to wean below this have been unsuccessful.  The patient passed a swallow study yesterday, but had poor PO overnight due to poor appetite, requiring prolonging of D10 drip.  Cr continues to downtrend, with good UOP.    VITAL SIGNS: Temp:  [98.8 F (37.1 C)-101.5 F (38.6 C)] 98.8 F (37.1 C) (01/09 0600) Pulse Rate:  [78-150] 84 (01/09 0600) Resp:  [15-36] 18 (01/09 0600) BP: (76-138)/(34-74) 106/71 mmHg (01/08 1630) SpO2:  [92 %-99 %] 98 % (01/09 0600) Arterial Line BP: (72-125)/(39-66) 125/65 mmHg (01/09 0600) Weight:  [246 lb 14.6 oz (112 kg)] 246 lb 14.6 oz (112 kg) (01/09 0400)  HEMODYNAMICS: CVP:  [13 mmHg-19 mmHg] 16 mmHg VENTILATOR SETTINGS:   INTAKE / OUTPUT: Intake/Output     01/08 0701 - 01/09 0700 01/09 0701 - 01/10 0700   P.O. 120    I.V. (mL/kg) 1979.6 (17.7)    Other 6    IV Piggyback 750    Total  Intake(mL/kg) 2855.6 (25.5)    Urine (mL/kg/hr) 2505 (0.9) 170 (0.6)   Stool     Total Output 2505 170   Net +350.6 -170        Stool Occurrence 1 x 1 x    PHYSICAL EXAMINATION: General: lying in bed, able to follow commands, but confused HEENT: PERRL, EOMI, oropharynx clear and non-erythematous  Neck: supple Lungs: tachypneic, no wheezes, rales, or ronchi Heart: tachycardic, irregularly irregular rhythm Abdomen: soft, non-tender, non-distended, normal bowel sounds. GU: Large area of erythema around groin and intertriginous areas, with wide ulcer on right posterior thigh. Extremities: 2+ pitting edema, multiple scattered white plaques on bilateral feet Neurologic: A&O to person, follows commands without difficulty, moves all 4 extremities  LABS:  CBC  Recent Labs Lab 11/15/13 1145 11/16/13 0300 11/17/13 0410  WBC 7.9 7.4 11.1*  HGB 11.9* 11.1* 11.8*  HCT 32.3* 31.6* 34.0*  PLT 96* 86* 84*   Coag's  Recent Labs Lab 11/14/13 1230  INR 1.34   BMET  Recent Labs Lab 11/15/13 1145 11/16/13 0300 11/17/13 0410  NA 141 140 136*  K 5.2 4.8 5.2  CL 106 105 103  CO2 21 22 18*  BUN 60* 52* 42*  CREATININE 2.49* 2.30* 2.13*  GLUCOSE 106* 119* 107*   Electrolytes  Recent Labs Lab 11/15/13 1145 11/16/13 0300 11/17/13 0410  CALCIUM 7.8* 8.2* 8.7   Sepsis Markers  Recent Labs Lab 11/14/13 1000 11/14/13 1001 11/17/13 0630 11/17/13  0646  LATICACIDVEN  --  2.1 0.8  --   PROCALCITON 0.24  --   --  0.69   ABG  Recent Labs Lab 11/14/13 0851  PHART 7.094*  PCO2ART 29.0*  PO2ART 145.0*   Liver Enzymes  Recent Labs Lab 11/15/13 1145  AST 34  ALT 17  ALKPHOS 133*  BILITOT 0.7  ALBUMIN 2.2*   Cardiac Enzymes  Recent Labs Lab 11/14/13 1002 11/14/13 1602 11/14/13 2202  TROPONINI <0.30 <0.30 <0.30  PROBNP 1616.0*  --   --    Glucose  Recent Labs Lab 11/16/13 1233 11/16/13 1714 11/16/13 2025 11/16/13 2307 11/17/13 0414 11/17/13 0827   GLUCAP 86 111* 115* 120* 99 103*    Imaging Dg Chest Port 1 View  11/17/2013   CLINICAL DATA:  Fever  EXAM: PORTABLE CHEST - 1 VIEW  COMPARISON:  11/15/2013  FINDINGS: Stable moderate cardiomegaly. Vascular congestion has resolved. No sign of pulmonary edema. Stable left internal jugular central venous catheter. No pneumothorax.  IMPRESSION: Cardiomegaly and resolved vascular congestion.   Electronically Signed   By: Maryclare Bean M.D.   On: 11/17/2013 08:03   Dg Chest Port 1 View  11/15/2013   CLINICAL DATA:  Central line placement assessment  EXAM: PORTABLE CHEST - 1 VIEW  COMPARISON:  Portable chest x-ray of November 14, 2013  FINDINGS: The patient has undergone interval placement of a left internal jugular venous catheter. The tip of the catheter lies in the region of the midportion of the SVC. There is no evidence of a postprocedure pneumothorax or hemothorax. The cardiopericardial silhouette remains enlarged. The pulmonary vascularity remains engorged. The pulmonary interstitial markings are more prominent diffusely. There is no pleural effusion.  IMPRESSION: There is no evidence of a postprocedure complication following placement of the left internal jugular venous catheter. There are findings consistent with CHF with interstitial edema.   Electronically Signed   By: David  Martinique   On: 11/15/2013 13:01    ASSESSMENT / PLAN:  PULMONARY A:  H/o OHS. P:   -Goal SpO2>92 -Supplemental oxygen PRN -DNI  CARDIOVASCULAR A:  Bradycardic cardiac arrest. - likely secondary to metabolic disturbances. Now resolved, temp pacer removed 1/7 Afib Shock, likely septic. P:  -hold all nodal blocking agents for now, per cards -wean Neo as tolerated to MAP goal > 55-60 -DNR  RENAL A:   Acute renal failure - likely 2/2 ATN from septic shock/cardiac arrest, improving Metabolic acidosis. P:   -Trend BMP -Hold ACEI  GASTROINTESTINAL A:   Nutrition. GI Px.  P:   -continue dysphagia 3 diet,  encourage patient to eat -wean off D10 as PO intake increases -Protonix  HEMATOLOGIC A:   Anemia.  Thrombocytopenia. VTE Px. P:  -Trend CBC -Transfuse for goal Hb > 7 -Hold Heparin and ASA -SCDs  INFECTIOUS A:   UTI - urine culture shows > 100K colonies g- rods Suspected cellulitis / decubitus infection - wide superficial ulcer on R posterior thigh Candida dermatitis  P:   -Cx / abx as above  ENDOCRINE A:   DM. Hypoglycemia - likely due to sepsis P:   -SSI -wean off D10 drip as tolerated -continue dys 3 diet  NEUROLOGIC A:   Acute encephalopathy - likely due to medical illness, but improving Delirium - mild, due to medical illness ?underlying dementia - likely unmasked by acute delirium P:   -Monitor clinically   Elnora Morrison, PGY3 Pgr. [phone] 11/17/2013, 9:28 AM  Attending:  I have seen and examined the patient  with nurse practitioner/resident and agree with the note above.   Better today, still weaning pressors Send c.diff  I have personally obtained a history, examined the patient, evaluated laboratory and imaging results, formulated the assessment and plan and placed orders.  CRITICAL CARE: The patient is critically ill with multiple organ systems failure and requires high complexity decision making for assessment and support, frequent evaluation and titration of therapies, application of advanced monitoring technologies and extensive interpretation of multiple databases. Critical Care Time devoted to patient care services described in this note is 35 minutes.   Jillyn Hidden PCCM Pager: [phone] Cell: [phone] If no response, call [phone]

## 2013-11-17 NOTE — Progress Notes (Signed)
Updated Dr. Sabino Gasser on current pt status.  Requested that D10 be increased back to 30 as pt not eating and cbg trending down.  Also notified that :neo-synephrine had to be restarted as mean pressure hovering 54-55 while off drip, and per monitor pt having sinus pauses.  Orders received.  Will continue to monitor pt closely.

## 2013-11-18 LAB — GLUCOSE, CAPILLARY
GLUCOSE-CAPILLARY: 113 mg/dL — AB (ref 70–99)
GLUCOSE-CAPILLARY: 130 mg/dL — AB (ref 70–99)
GLUCOSE-CAPILLARY: 84 mg/dL (ref 70–99)
GLUCOSE-CAPILLARY: 86 mg/dL (ref 70–99)
Glucose-Capillary: 116 mg/dL — ABNORMAL HIGH (ref 70–99)
Glucose-Capillary: 132 mg/dL — ABNORMAL HIGH (ref 70–99)
Glucose-Capillary: 159 mg/dL — ABNORMAL HIGH (ref 70–99)
Glucose-Capillary: 94 mg/dL (ref 70–99)

## 2013-11-18 LAB — CBC
HCT: 32.7 % — ABNORMAL LOW (ref 36.0–46.0)
Hemoglobin: 11.3 g/dL — ABNORMAL LOW (ref 12.0–15.0)
MCH: 31.4 pg (ref 26.0–34.0)
MCHC: 34.6 g/dL (ref 30.0–36.0)
MCV: 90.8 fL (ref 78.0–100.0)
PLATELETS: 74 10*3/uL — AB (ref 150–400)
RBC: 3.6 MIL/uL — AB (ref 3.87–5.11)
RDW: 15.9 % — AB (ref 11.5–15.5)
WBC: 7.5 10*3/uL (ref 4.0–10.5)

## 2013-11-18 LAB — BASIC METABOLIC PANEL
BUN: 38 mg/dL — ABNORMAL HIGH (ref 6–23)
CALCIUM: 8.8 mg/dL (ref 8.4–10.5)
CO2: 20 meq/L (ref 19–32)
CREATININE: 1.96 mg/dL — AB (ref 0.50–1.10)
Chloride: 103 mEq/L (ref 96–112)
GFR calc Af Amer: 27 mL/min — ABNORMAL LOW (ref >90)
GFR, EST NON AFRICAN AMERICAN: 23 mL/min — AB (ref >90)
Glucose, Bld: 100 mg/dL — ABNORMAL HIGH (ref 70–99)
Potassium: 4.6 mEq/L (ref 3.7–5.3)
SODIUM: 135 meq/L — AB (ref 137–147)

## 2013-11-18 MED ORDER — GERHARDT'S BUTT CREAM
TOPICAL_CREAM | Freq: Two times a day (BID) | CUTANEOUS | Status: DC
  Administered 2013-11-18 – 2013-11-24 (×12): via TOPICAL
  Filled 2013-11-18: qty 1

## 2013-11-18 NOTE — Progress Notes (Signed)
PULMONARY / CRITICAL CARE MEDICINE  Name: Tara Huff MRN: [id number] DOB: 12-Mar-1934    ADMISSION DATE:  11/14/2013 CONSULTATION DATE:  11/14/2013  REFERRING MD :  Cardiology  PRIMARY SERVICE: PCCM  CHIEF COMPLAINT:  Bradycardic arrest. Probable sepsis   BRIEF PATIENT DESCRIPTION: 80 yo admitted 1/6 s/p bradycardic arrest in setting of metabolic acidosis and probable sepsis. Underwent transvenous pacemaker placement.  SIGNIFICANT EVENTS / STUDIES:  1/6  Cath lab >>> Transvenous pacer placement 1/6  LE venous Dopplers>>> prelim neg for DVT 1/6  TTE >>> EF 40%, LA mod dilated, RA severely dilated, mod-severe tricuspid regurg 1/7  Temp pacer removed  LINES / TUBES: Right groin venous temp pacemaker 1/6 >>> 1/7 L IJ CVL 1/7 >>> L rad a-line ??? >>> Foley 1/6 >>>  CULTURES: 1/6 Blood culture >>>  1/6 Urine >>> ESCHERICHIA COLI 1/6 MRSA>>> POS 1/9 C. Diff PCR >>> neg  ANTIBIOTICS: Vancomycin 1/6 >>> Zosyn 1/6 >>> Diflucan 1/6 >>>  SUBJECTIVE:  No issues overnight. Complains of diarrhea.  VITAL SIGNS: Temp:  [97.7 F (36.5 C)-99 F (37.2 C)] 98.2 F (36.8 C) (01/10 0400) Pulse Rate:  [73-90] 88 (01/10 0400) Resp:  [14-35] 15 (01/10 0400) SpO2:  [94 %-100 %] 96 % (01/10 0400) Arterial Line BP: (73-108)/(44-59) 102/54 mmHg (01/10 0400)  HEMODYNAMICS: CVP:  [10 mmHg-16 mmHg] 10 mmHg  VENTILATOR SETTINGS:   INTAKE / OUTPUT: Intake/Output     01/09 0701 - 01/10 0700 01/10 0701 - 01/11 0700   P.O. 300    I.V. (mL/kg) 1109.7 (9.9)    Other     IV Piggyback 200    Total Intake(mL/kg) 1609.7 (14.4)    Urine (mL/kg/hr) 825 (0.3)    Total Output 825     Net +784.7          Stool Occurrence 1 x     PHYSICAL EXAMINATION: General:  Comfortable, no respiratory distress Neuro:  Awake, alert, following commands HEENT:  Dry membranes, no JVD Cardiovascular:  RRR, no m/r/g Lungs:  Bilateral diminished air entry, no w/r/r Abdomen:  Soft, nontender, bowel sounds  diminished Musculoskeletal:  Chromic skin changes LEBL, trace edema Skin:  No rash  LABS:  CBC  Recent Labs Lab 11/16/13 0300 11/17/13 0410 11/18/13 0615  WBC 7.4 11.1* 7.5  HGB 11.1* 11.8* 11.3*  HCT 31.6* 34.0* 32.7*  PLT 86* 84* 74*   Coag's  Recent Labs Lab 11/14/13 1230  INR 1.34   BMET  Recent Labs Lab 11/15/13 1145 11/16/13 0300 11/17/13 0410  NA 141 140 136*  K 5.2 4.8 5.2  CL 106 105 103  CO2 21 22 18*  BUN 60* 52* 42*  CREATININE 2.49* 2.30* 2.13*  GLUCOSE 106* 119* 107*   Electrolytes  Recent Labs Lab 11/15/13 1145 11/16/13 0300 11/17/13 0410  CALCIUM 7.8* 8.2* 8.7   Sepsis Markers  Recent Labs Lab 11/14/13 1000 11/14/13 1001 11/17/13 0630 11/17/13 0646  LATICACIDVEN  --  2.1 0.8  --   PROCALCITON 0.24  --   --  0.69   ABG  Recent Labs Lab 11/14/13 0851  PHART 7.094*  PCO2ART 29.0*  PO2ART 145.0*   Liver Enzymes  Recent Labs Lab 11/15/13 1145  AST 34  ALT 17  ALKPHOS 133*  BILITOT 0.7  ALBUMIN 2.2*   Cardiac Enzymes  Recent Labs Lab 11/14/13 1002 11/14/13 1602 11/14/13 2202  TROPONINI <0.30 <0.30 <0.30  PROBNP 1616.0*  --   --    Glucose  Recent Labs Lab  11/17/13 0827 11/17/13 1159 11/17/13 1628 11/17/13 2045 11/18/13 0031 11/18/13 0439  GLUCAP 103* 103* 84 159* 116* 86   CXR: 1/9 >>> Resolved pulmonary vascular congestion  ASSESSMENT / PLAN:  PULMONARY A:  H/o OHS. P:   Goal SpO2>92 Supplemental oxygen PRN DNI  CARDIOVASCULAR A:  Bradycardic cardiac arrest, likely secondary to metabolic disturbances - resolved. Temp pacer removed 1/7. AF, no in sinus rhythm. Shock, likely septic - resolving. P:  Goal MAP > 55 or SBP > 90 Cardiology following Hold all nodal blocking agents Wean Neo as tolerated to MAP goal > 55-60 DNR  RENAL A:   AKI. Metabolic acidosis. P:   Trend BMP Hold ACEI  GASTROINTESTINAL A:   Nutrition. GI Px.  P:   Dysphagia 3 diet, encourage oral  intake D10@30  Protonix  HEMATOLOGIC A:   Anemia.  Thrombocytopenia. VTE Px. P:  Trend CBC Transfuse for goal Hb > 7 Hold Heparin and ASA SCDs  INFECTIOUS A:   UTI with ESCHERICHIA COLI. Suspected cellulitis / decubitus infection - wide superficial ulcer on R posterior thigh. Candida dermatitis. P:   Cx / abx as above  ENDOCRINE A:   DM. Hypoglycemia in setting of sepsis / decreased oral intake. P:   SSI Wean off D10 drip as tolerated  NEUROLOGIC A:   Acute encephalopathy / delirium - resolving. ?underlying dementia. P:   Monitor clinically  I have personally obtained history, examined patient, evaluated and interpreted laboratory and imaging results, reviewed medical records, formulated assessment / plan and placed orders.  CRITICAL CARE:  The patient is critically ill with multiple organ systems failure and requires high complexity decision making for assessment and support, frequent evaluation and titration of therapies, application of advanced monitoring technologies and extensive interpretation of multiple databases. Critical Care Time devoted to patient care services described in this note is 30 minutes.   Doree Fudge, MD Pulmonary and [place] Pager: [phone]  11/18/2013, 8:03 AM

## 2013-11-19 LAB — BASIC METABOLIC PANEL
BUN: 33 mg/dL — AB (ref 6–23)
CHLORIDE: 104 meq/L (ref 96–112)
CO2: 18 meq/L — AB (ref 19–32)
CREATININE: 1.9 mg/dL — AB (ref 0.50–1.10)
Calcium: 8.2 mg/dL — ABNORMAL LOW (ref 8.4–10.5)
GFR calc Af Amer: 28 mL/min — ABNORMAL LOW (ref >90)
GFR calc non Af Amer: 24 mL/min — ABNORMAL LOW (ref >90)
GLUCOSE: 97 mg/dL (ref 70–99)
POTASSIUM: 4.4 meq/L (ref 3.7–5.3)
Sodium: 135 mEq/L — ABNORMAL LOW (ref 137–147)

## 2013-11-19 LAB — CBC
HEMATOCRIT: 30.6 % — AB (ref 36.0–46.0)
HEMOGLOBIN: 10.6 g/dL — AB (ref 12.0–15.0)
MCH: 31.5 pg (ref 26.0–34.0)
MCHC: 34.6 g/dL (ref 30.0–36.0)
MCV: 90.8 fL (ref 78.0–100.0)
Platelets: 60 10*3/uL — ABNORMAL LOW (ref 150–400)
RBC: 3.37 MIL/uL — ABNORMAL LOW (ref 3.87–5.11)
RDW: 15.8 % — AB (ref 11.5–15.5)
WBC: 5.2 10*3/uL (ref 4.0–10.5)

## 2013-11-19 LAB — GLUCOSE, CAPILLARY
GLUCOSE-CAPILLARY: 103 mg/dL — AB (ref 70–99)
Glucose-Capillary: 103 mg/dL — ABNORMAL HIGH (ref 70–99)
Glucose-Capillary: 87 mg/dL (ref 70–99)
Glucose-Capillary: 131 mg/dL — ABNORMAL HIGH (ref 70–99)
Glucose-Capillary: 158 mg/dL — ABNORMAL HIGH (ref 70–99)
Glucose-Capillary: 170 mg/dL — ABNORMAL HIGH (ref 70–99)

## 2013-11-19 MED ORDER — INSULIN ASPART 100 UNIT/ML ~~LOC~~ SOLN: 2.0000 [IU] | Freq: Three times a day (TID) | SUBCUTANEOUS | Status: DC

## 2013-11-19 NOTE — Progress Notes (Signed)
PULMONARY / CRITICAL CARE MEDICINE  Name: Tara Huff MRN: [id number] DOB: 1934/06/08    ADMISSION DATE:  11/14/2013 CONSULTATION DATE:  11/14/2013  REFERRING MD :  Cardiology  PRIMARY SERVICE: PCCM  CHIEF COMPLAINT:  Bradycardic arrest. Probable sepsis   BRIEF PATIENT DESCRIPTION: 79 yo admitted 1/6 s/p bradycardic arrest in setting of metabolic acidosis and probable sepsis. Underwent transvenous pacemaker placement.  SIGNIFICANT EVENTS / STUDIES:  1/6  Cath lab >>> Transvenous pacer placement 1/6  LE venous Dopplers>>> prelim neg for DVT 1/6  TTE >>> EF 40%, LA mod dilated, RA severely dilated, mod-severe tricuspid regurg 1/7  Temp pacer removed 1/10 Off vasopressors 1/11 Transfer to SDU 1/12  TRH to pick up  LINES / TUBES: Right groin venous temp pacemaker 1/6 >>> 1/7 L IJ CVL 1/7 >>> 1/11 L rad a-line ??? >>> 1/11 Foley 1/6 >>>  CULTURES: 1/6 Blood culture >>>  1/6 Urine >>> ESCHERICHIA COLI 1/6 MRSA>>> POS 1/9 C. Diff PCR >>> neg  ANTIBIOTICS: Vancomycin 1/6 >>> 1/11 Zosyn 1/6 >>> Diflucan 1/6 >>> 1/11  SUBJECTIVE:  Hungry. No overnight issues.  Off pressors.  VITAL SIGNS: Temp:  [95.6 F (35.3 C)-99.7 F (37.6 C)] 99.7 F (37.6 C) (01/11 0610) Pulse Rate:  [43-98] 43 (01/11 0610) Resp:  [13-29] 24 (01/11 0610) SpO2:  [93 %-99 %] 93 % (01/11 0610) Arterial Line BP: (75-107)/(40-60) 90/54 mmHg (01/11 0610) Weight:  [113.9 kg (251 lb 1.7 oz)] 113.9 kg (251 lb 1.7 oz) (01/11 0404)  HEMODYNAMICS: CVP:  [10 mmHg-17 mmHg] 10 mmHg  VENTILATOR SETTINGS:   INTAKE / OUTPUT: Intake/Output     01/10 0701 - 01/11 0700 01/11 0701 - 01/12 0700   P.O. 480    I.V. (mL/kg) 667.7 (5.9)    IV Piggyback 750    Total Intake(mL/kg) 1897.7 (16.7)    Urine (mL/kg/hr) 1505 (0.6)    Total Output 1505     Net +392.7           PHYSICAL EXAMINATION: General:  Resting in no distress Neuro:  Awake, non focal HEENT:  Dry membranes, no JVD Cardiovascular:  RRR, no  m/r/g Lungs:  CTAB Abdomen:  Soft, nontender, bowel sounds diminished Musculoskeletal:  Chromic skin changes LEBL, trace edema Skin:  No rash  LABS:  CBC  Recent Labs Lab 11/17/13 0410 11/18/13 0615 11/19/13 0409  WBC 11.1* 7.5 5.2  HGB 11.8* 11.3* 10.6*  HCT 34.0* 32.7* 30.6*  PLT 84* 74* 60*   Coag's  Recent Labs Lab 11/14/13 1230  INR 1.34   BMET  Recent Labs Lab 11/17/13 0410 11/18/13 0615 11/19/13 0409  NA 136* 135* 135*  K 5.2 4.6 4.4  CL 103 103 104  CO2 18* 20 18*  BUN 42* 38* 33*  CREATININE 2.13* 1.96* 1.90*  GLUCOSE 107* 100* 97   Electrolytes  Recent Labs Lab 11/17/13 0410 11/18/13 0615 11/19/13 0409  CALCIUM 8.7 8.8 8.2*   Sepsis Markers  Recent Labs Lab 11/14/13 1000 11/14/13 1001 11/17/13 0630 11/17/13 0646  LATICACIDVEN  --  2.1 0.8  --   PROCALCITON 0.24  --   --  0.69   ABG  Recent Labs Lab 11/14/13 0851  PHART 7.094*  PCO2ART 29.0*  PO2ART 145.0*   Liver Enzymes  Recent Labs Lab 11/15/13 1145  AST 34  ALT 17  ALKPHOS 133*  BILITOT 0.7  ALBUMIN 2.2*   Cardiac Enzymes  Recent Labs Lab 11/14/13 1002 11/14/13 1602 11/14/13 2202  TROPONINI <0.30 <0.30 <0.30  PROBNP 1616.0*  --   --    Glucose  Recent Labs Lab 11/18/13 0802 11/18/13 1136 11/18/13 1611 11/18/13 1932 11/18/13 2346 11/19/13 0356  GLUCAP 94 132* 113* 103* 130* 103*   CXR: None today.  ASSESSMENT / PLAN:  PULMONARY A:  H/o OHS. P:   Goal SpO2>92 Supplemental oxygen PRN DNI  CARDIOVASCULAR A:  Bradycardic cardiac arrest, likely secondary to metabolic disturbances - resolved. Temp pacer removed 1/7. AF, no in sinus rhythm. Shock, likely septic - resolved. P:  Goal MAP > 55 or SBP > 90 Cardiology following Hold all nodal blocking agents D/c Neo D/c a-line, central line DNR  RENAL A:   AKI. Metabolic acidosis. Hyponatremia. P:   Trend BMP Hold ACEI  GASTROINTESTINAL A:   Nutrition. GI Px is not  required. P:   Dysphagia 3 diet, encourage oral intake D10@30  D/c Protonix  HEMATOLOGIC A:   Anemia.  Thrombocytopenia. VTE Px. P:  Trend CBC Transfuse for goal Hb > 7 Hold Heparin and ASA SCDs  INFECTIOUS A:   UTI with ESCHERICHIA COLI. Suspected cellulitis / decubitus infection - wide superficial ulcer on R posterior thigh. Candida dermatitis. P:   D/c Diflucan, Vancomycin Continue Zosyn   ENDOCRINE A:   DM. Hypoglycemia in setting of sepsis / decreased oral intake. P:   SSI Wean off D10 drip as tolerated  NEUROLOGIC A:   Acute encephalopathy / delirium - resolving. ?underlying dementia. P:   Monitor clinically  Transfer to SDU.  PCCM will sign off.  TRH to assume care 1/12.  I have personally obtained history, examined patient, evaluated and interpreted laboratory and imaging results, reviewed medical records, formulated assessment / plan and placed orders.  Doree Fudge, MD Pulmonary and [place] Pager: [phone]  11/19/2013, 8:30 AM

## 2013-11-20 DIAGNOSIS — C541 Malignant neoplasm of endometrium: Secondary | ICD-10-CM | POA: Insufficient documentation

## 2013-11-20 DIAGNOSIS — R5381 Other malaise: Secondary | ICD-10-CM | POA: Diagnosis present

## 2013-11-20 HISTORY — DX: Malignant neoplasm of endometrium: C54.1

## 2013-11-20 LAB — CBC
HEMATOCRIT: 29.5 % — AB (ref 36.0–46.0)
HEMOGLOBIN: 10.4 g/dL — AB (ref 12.0–15.0)
MCH: 31.9 pg (ref 26.0–34.0)
MCHC: 35.3 g/dL (ref 30.0–36.0)
MCV: 90.5 fL (ref 78.0–100.0)
Platelets: 62 10*3/uL — ABNORMAL LOW (ref 150–400)
RBC: 3.26 MIL/uL — AB (ref 3.87–5.11)
RDW: 15.6 % — ABNORMAL HIGH (ref 11.5–15.5)
WBC: 3.6 10*3/uL — ABNORMAL LOW (ref 4.0–10.5)

## 2013-11-20 LAB — BASIC METABOLIC PANEL
BUN: 26 mg/dL — AB (ref 6–23)
CHLORIDE: 103 meq/L (ref 96–112)
CO2: 17 meq/L — AB (ref 19–32)
Calcium: 8.4 mg/dL (ref 8.4–10.5)
Creatinine, Ser: 1.73 mg/dL — ABNORMAL HIGH (ref 0.50–1.10)
GFR calc Af Amer: 32 mL/min — ABNORMAL LOW (ref >90)
GFR, EST NON AFRICAN AMERICAN: 27 mL/min — AB (ref >90)
Glucose, Bld: 68 mg/dL — ABNORMAL LOW (ref 70–99)
Potassium: 4.2 mEq/L (ref 3.7–5.3)
SODIUM: 135 meq/L — AB (ref 137–147)

## 2013-11-20 LAB — CULTURE, BLOOD (ROUTINE X 2)
CULTURE: NO GROWTH
CULTURE: NO GROWTH

## 2013-11-20 LAB — GLUCOSE, CAPILLARY
GLUCOSE-CAPILLARY: 68 mg/dL — AB (ref 70–99)
GLUCOSE-CAPILLARY: 107 mg/dL — AB (ref 70–99)
GLUCOSE-CAPILLARY: 142 mg/dL — AB (ref 70–99)
GLUCOSE-CAPILLARY: 165 mg/dL — AB (ref 70–99)

## 2013-11-20 LAB — HEMOGLOBIN A1C
Hgb A1c MFr Bld: 6.6 % — ABNORMAL HIGH (ref <5.7)
Mean Plasma Glucose: 143 mg/dL — ABNORMAL HIGH (ref <117)

## 2013-11-20 MED ORDER — ACETAMINOPHEN 325 MG PO TABS: 650.0000 mg | ORAL_TABLET | Freq: Four times a day (QID) | ORAL | Status: DC | PRN

## 2013-11-20 MED ORDER — INSULIN ASPART 100 UNIT/ML ~~LOC~~ SOLN
0.0000 [IU] | Freq: Three times a day (TID) | SUBCUTANEOUS | Status: DC
  Administered 2013-11-24: 1 [IU] via SUBCUTANEOUS

## 2013-11-20 MED ORDER — ALPRAZOLAM 0.25 MG PO TABS: 0.2500 mg | ORAL_TABLET | Freq: Every evening | ORAL | Status: DC | PRN

## 2013-11-20 MED ORDER — ASPIRIN EC 81 MG PO TBEC
81.0000 mg | DELAYED_RELEASE_TABLET | Freq: Every day | ORAL | Status: DC
  Administered 2013-11-20 – 2013-11-24 (×5): 81 mg via ORAL
  Filled 2013-11-20 (×6): qty 1

## 2013-11-20 MED ORDER — ALUM & MAG HYDROXIDE-SIMETH 200-200-20 MG/5ML PO SUSP: 15.0000 mL | ORAL | Status: DC | PRN

## 2013-11-20 MED ORDER — DEXTROSE 5 % IV SOLN
1.0000 g | INTRAVENOUS | Status: DC
  Administered 2013-11-20 – 2013-11-23 (×4): 1 g via INTRAVENOUS
  Filled 2013-11-20 (×5): qty 10

## 2013-11-20 MED ORDER — OXYCODONE HCL 5 MG PO TABS: 5.0000 mg | ORAL_TABLET | ORAL | Status: DC | PRN

## 2013-11-20 MED ORDER — INSULIN ASPART 100 UNIT/ML ~~LOC~~ SOLN: 0.0000 [IU] | Freq: Every day | SUBCUTANEOUS | Status: DC
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
## 2013-11-20 NOTE — Progress Notes (Signed)
CSW noticed NP note stating: "CIR consult for possible admission-if not candidate will need to go to skilled nursing facility." CSW to complete assessment and send information to SNFs at pt/family request.   Ky Barban, MSW, [place] Social Worker [phone]

## 2013-11-20 NOTE — Progress Notes (Signed)
Pt transferred to new unit. As pt refused SNF and is opting for home health, this CSW has not completed a handoff for new CSW as at this time there are no further CSW needs. CSW can be re-consulted if pt changes her mind about discharge plan.   Ky Barban, MSW, [facility] Clinical Social Worker [phone]

## 2013-11-20 NOTE — Progress Notes (Signed)
I have contacted Dr. Thereasa Solo to request PT and OT evals to assist before completing rehab consult. We will follow up after evals. [phone]

## 2013-11-20 NOTE — Progress Notes (Signed)
[facility] [place] TEAM 1 - Stepdown/ICU TEAM  Tara Huff WUX:[id number] DOB: 19-Aug-1934 DOA: 11/14/2013 PCP: Reginia Naas, MD  Brief narrative: 80 year old female patient who presented to the ER after being resuscitated by EMS for bradycardic arrest. Upon arrival to the ER she was alert breathing on her own and being externally paced. Cardiology was consulted and a transvenous temporary place maker was placed in the emergency department due to patient noted to be in complete heart block. In addition the patient was found to be hypothermic with metabolic acidosis, hyperkalemia, acute renal failure and hypotension/shock. Prior to admission she was on chronic beta blocker and diltiazem for rate control for her underlying atrial fibrillation. She also appeared to be septic. She was subsequently admitted to the ICU by the PCCM service  After admission it was felt that her heart block was primarily related to preadmission meds in the setting of multiple metabolic derangements. Patient did require pressor support transiently until her bradycardia/heart block resolved. No additional ischemic workup indicated. Patient's heart rate recovered and the pacemaker was removed and cardiology signed off. TTE showed an EF of 40% which was lower than EF in June 2014.  Urinalysis was positive for Escherichia coli and this was likely the source of her presumed septic symptoms.  Patient was subsequently transferred out of the ICU to the step down unit on 11/19/2013.  SIGNIFICANT EVENTS / STUDIES:  1/6 Cath lab >>> Transvenous pacer placement  1/6 LE venous Dopplers>>> prelim neg for DVT  1/6 TTE >>> EF 40%, LA mod dilated, RA severely dilated, mod-severe tricuspid regurg  1/7 Temp pacer removed  1/10 Off vasopressors  1/11 Transfer to SDU  1/12 TRH to pick up  HPI/Subjective: Patient alert and endorsing feels much better than prior to admission but still feels very weak. No current chest  pain or shortness of breath.  Assessment/Plan:    Sepsis/Escherichia coli UTI -Hemodynamically stable -Continue antibiotics for a total of 10 days-pansensitive so narrow to Rocephin -Still with low-grade fevers-discontinue Foley catheter    Complete heart block -Resolved with multifactorial etiology as described above    Acute renal failure/CKD 3-4 -Resolved -Baseline BUN/creatinine 23/106 with a GFR of 50    Hypothermia/Metabolic acidosis -Resolved: related to sepsis as well as acute renal failure    PULMONARY HYPERTENSION/Sleep apnea -Echo this admission revealed severely dilated right atrium and moderate to severe tricuspid regurgitation -Unclear if on CPAP at hour of sleep at home   Chronic systolic heart failure EF 40% -Echo this admission with progression but this may have been related to preadmission hypotension and bradycardia -Will need followup echo at some point as an outpatient to see if he has recovered    Type 2 diabetes mellitus -CBGs controlled -Was on Amaryl and Glucophage prior to admission - Continue sliding scale insulin for now -Hemoglobin A1c 10.6 in June-repeat this admission   HTN -Was on Dilacor, Vasotec, Lasix, Aldactone and Lopressor prior to the admit -BP soft so we'll not resume antihypertensives at this time    Morbid obesity    Campath-induced atrial fibrillation -Currently rate controlled off medication    History of MI (myocardial infarction)    Physical deconditioning -CIR consult for possible admission-if not candidate will need to go to skilled nursing facility  DVT prophylaxis: SCDs Code Status: DO NOT RESUSCITATE Family Communication: No family at bedside Disposition Plan/Expected LOS: Transfer to telemetry  Consultants: Cardiology  Antibiotics: Vancomycin 1/6 >>> 1/11  Zosyn 1/6 >>> 1/12 Diflucan 1/6 >>>  1/11 Rocephin 1/12 >>>  Objective: Blood pressure 101/59, pulse 86, temperature 100.2 F (37.9 C), temperature  source Core (Comment), resp. rate 21, height 5\' 8"  (1.727 m), weight 251 lb 1.7 oz (113.9 kg), SpO2 96.00%.  Intake/Output Summary (Last 24 hours) at 11/20/13 1129 Last data filed at 11/20/13 0805  Gross per 24 hour  Intake    960 ml  Output    760 ml  Net    200 ml   Exam: General: No acute respiratory distress Lungs: Clear to auscultation bilaterally without wheezes or crackles, RA Cardiovascular: Irregular rate and atrial fibrillation rhythm without murmur gallop or rub normal S1 and S2, significant lower extremity edema mostly chronic in nature with skin changes consistent with stasis dermatitis, no JVD Abdomen: Nontender, nondistended, soft, bowel sounds positive, no rebound, no ascites, no appreciable mass Musculoskeletal: No significant cyanosis, clubbing of bilateral lower extremities Neurological: Alert and oriented x 3, moves all extremities x 4 without focal neurological deficits, CN 2-12 intact  Scheduled Meds:  Scheduled Meds: . antiseptic oral rinse  15 mL Mouth Rinse QID  . chlorhexidine  15 mL Mouth/Throat BID  . Gerhardt's butt cream   Topical BID  . insulin aspart  2-6 Units Subcutaneous TID WC  . piperacillin-tazobactam (ZOSYN)  IV  3.375 g Intravenous Q8H   Data Reviewed: Basic Metabolic Panel:  Recent Labs Lab 11/16/13 0300 11/17/13 0410 11/18/13 0615 11/19/13 0409 11/20/13 0855  NA 140 136* 135* 135* 135*  K 4.8 5.2 4.6 4.4 4.2  CL 105 103 103 104 103  CO2 22 18* 20 18* 17*  GLUCOSE 119* 107* 100* 97 68*  BUN 52* 42* 38* 33* 26*  CREATININE 2.30* 2.13* 1.96* 1.90* 1.73*  CALCIUM 8.2* 8.7 8.8 8.2* 8.4   Liver Function Tests:  Recent Labs Lab 11/15/13 1145  AST 34  ALT 17  ALKPHOS 133*  BILITOT 0.7  PROT 6.1  ALBUMIN 2.2*   CBC:  Recent Labs Lab 11/14/13 0830  11/16/13 0300 11/17/13 0410 11/18/13 0615 11/19/13 0409 11/20/13 0855  WBC 3.9*  < > 7.4 11.1* 7.5 5.2 3.6*  NEUTROABS 2.6  --   --   --   --   --   --   HGB 5.9*  < >  11.1* 11.8* 11.3* 10.6* 10.4*  HCT 17.0*  < > 31.6* 34.0* 32.7* 30.6* 29.5*  MCV 91.4  < > 90.0 91.4 90.8 90.8 90.5  PLT 50*  < > 86* 84* 74* 60* 62*  < > = values in this interval not displayed. Cardiac Enzymes:  Recent Labs Lab 11/14/13 1002 11/14/13 1602 11/14/13 2202 11/17/13 0646  CKTOTAL  --   --   --  32  TROPONINI <0.30 <0.30 <0.30  --    BNP (last 3 results)  Recent Labs  04/27/13 1733 11/14/13 1002  PROBNP 2160.0* 1616.0*   CBG:  Recent Labs Lab 11/19/13 0356 11/19/13 0752 11/19/13 1136 11/19/13 1609 11/19/13 2031  GLUCAP 103* 87 131* 158* 170*    Recent Results (from the past 240 hour(s))  CULTURE, BLOOD (ROUTINE X 2)     Status: None   Collection Time    11/14/13 12:31 PM      Result Value Range Status   Specimen Description BLOOD RIGHT HAND   Final   Special Requests BOTTLES DRAWN AEROBIC ONLY 10CC   Final   Culture  Setup Time     Final   Value: 11/14/2013 16:21     Performed at [facility]   Culture     Final   Value: NO GROWTH 5 DAYS     Performed at [facility]   Report Status 11/20/2013 FINAL   Final  CULTURE, BLOOD (ROUTINE X 2)     Status: None   Collection Time    11/14/13 12:40 PM      Result Value Range Status   Specimen Description BLOOD LEFT HAND   Final   Special Requests BOTTLES DRAWN AEROBIC ONLY 5CC   Final   Culture  Setup Time     Final   Value: 11/14/2013 16:44     Performed at [facility]   Culture     Final   Value: NO GROWTH 5 DAYS     Performed at [facility]   Report Status 11/20/2013 FINAL   Final  URINE CULTURE     Status: None   Collection Time    11/14/13  2:16 PM      Result Value Range Status   Specimen Description URINE, CATHETERIZED   Final   Special Requests Normal   Final   Culture  Setup Time     Final   Value: 11/14/2013 20:29     Performed at [place]     Final   Value: >=100,000 COLONIES/ML     Performed at [facility]    Culture     Final   Value: ESCHERICHIA COLI     Performed at [facility]   Report Status 11/17/2013 FINAL   Final   Organism ID, Bacteria ESCHERICHIA COLI   Final  MRSA PCR SCREENING     Status: Abnormal   Collection Time    11/14/13  2:17 PM      Result Value Range Status   MRSA by PCR POSITIVE (*) NEGATIVE Final   Comment:            The GeneXpert MRSA Assay (FDA     approved for NASAL specimens     only), is one component of a     comprehensive MRSA colonization     surveillance program. It is not     intended to diagnose MRSA     infection nor to guide or     monitor treatment for     MRSA infections.     RESULT CALLED TO, READ BACK BY AND VERIFIED WITH:     C. RICE RN 17:05 11/14/13 ([email or web address])  CLOSTRIDIUM DIFFICILE BY PCR     Status: None   Collection Time    11/17/13  5:32 AM      Result Value Range Status   C difficile by pcr NEGATIVE  NEGATIVE Final     Studies:  Recent x-ray studies have been reviewed in detail by the Attending Physician  Time spent : 59mins  Allison Ellis, ANP [facility] Office  [phone] Pager [phone]  **If unable to reach the above provider after paging please contact the [place] @ [phone]  On-Call/Text Page:      [email or web address]      password [email or web address]  If 7PM-7AM, please contact night-coverage [email or web address] Password [email or web address] 11/20/2013, 11:29 AM   LOS: 6 days   I have personally examined this patient and reviewed the entire database. I have reviewed the above note, made any necessary editorial changes, and agree with its content.  Cherene Altes, MD [facility]

## 2013-11-20 NOTE — Progress Notes (Signed)
Thank you for consult on Tara Huff.  Chart reviewed and note that she has a complicated history. Please order PT/OT evaluations to help determine appropriate rehab venues as well as patient's ability to participate in therapy. Will follow up in am to complete formal evaluation.

## 2013-11-20 NOTE — Consult Note (Addendum)
WOC wound follow up Wound type: Refer to previous progress notes on 1/7 for assessment and plan of care.  Requested to re-assess right posterior full thickness thigh wound.  Pt does not recall the etiology of this wound. Appearance has improved since previous assessment. Pt is frequently incontinent of stool and it is difficult to prevent wound from becoming soiled. Measurement: 7X6X.1cm Wound bed: 100% beefy red Drainage (amount, consistency, odor) Mod amt yellow drainage, no odor Periwound: Intact skin surrounding Dressing procedure/placement/frequency: Foam dressing to protect and promote healing. If dressings become frequently soiled, then barrier cream can be resumed to protect site. Please re-consult if further assistance is needed.  Thank-you,  Julien Girt MSN, [place], [place], [place], [place]

## 2013-11-20 NOTE — Progress Notes (Signed)
ANTIBIOTIC CONSULT NOTE   Pharmacy Consult for zosyn Indication: cellulitis, UTI  No Known Allergies  Patient Measurements: Wt 116kg (estimated in cath lab)  Vital Signs: Temp: 100.2 F (37.9 C) (01/12 0805) Temp src: Core (Comment) (01/12 0805) BP: 101/59 mmHg (01/12 0805) Pulse Rate: 86 (01/12 0805) Intake/Output from previous day: 01/11 0701 - 01/12 0700 In: 1140 [P.O.:780; I.V.:260; IV Piggyback:100] Out: 760 [Urine:760] Intake/Output from this shift: Total I/O In: 120 [P.O.:120] Out: -   Labs:  Recent Labs  11/18/13 0615 11/19/13 0409 11/20/13 0855  WBC 7.5 5.2 3.6*  HGB 11.3* 10.6* 10.4*  PLT 74* 60* 62*  CREATININE 1.96* 1.90* 1.73*   Estimated Creatinine Clearance: 36.1 ml/min (by C-G formula based on Cr of 1.73). No results found for this basename: VANCOTROUGH, VANCOPEAK, VANCORANDOM, GENTTROUGH, GENTPEAK, GENTRANDOM, TOBRATROUGH, TOBRAPEAK, TOBRARND, AMIKACINPEAK, AMIKACINTROU, AMIKACIN,  in the last 72 hours   Assessment: 80 yo female with bradycardic arrest s/p temporary transvenous pacemaker placement. Patient to begin vancomycin/zosyn for concern of septic shock/cellulitis noted with possible sources UTI and decubitis ulcer.   Patient still with low grade temps tmax 100.4, wbc continues to trend down and is now low at 3.6. Urine was positive for e.coli other cultures have been negative. Renal function continues to slowly improve. Could consider de-escalating antibiotics with no further cultures being drawn.  Goal of Therapy:  Eradication of infection  Plan:  -Consider Zosyn 3.375gm IV q8h -Will follow renal function, cultures and clinical progress  Erin Hearing PharmD., BCPS Clinical Pharmacist Pager [phone] 11/20/2013 11:02 AM

## 2013-11-20 NOTE — Progress Notes (Signed)
Report called to receiving Rn [id number]. Patient with no complaints at the current time. Will transfer in bed.

## 2013-11-21 DIAGNOSIS — D696 Thrombocytopenia, unspecified: Secondary | ICD-10-CM | POA: Diagnosis present

## 2013-11-21 DIAGNOSIS — N183 Chronic kidney disease, stage 3 unspecified: Secondary | ICD-10-CM

## 2013-11-21 LAB — CBC
HCT: 32.5 % — ABNORMAL LOW (ref 36.0–46.0)
HEMOGLOBIN: 11.3 g/dL — AB (ref 12.0–15.0)
MCH: 31.7 pg (ref 26.0–34.0)
MCHC: 34.8 g/dL (ref 30.0–36.0)
MCV: 91.3 fL (ref 78.0–100.0)
Platelets: 82 10*3/uL — ABNORMAL LOW (ref 150–400)
RBC: 3.56 MIL/uL — ABNORMAL LOW (ref 3.87–5.11)
RDW: 15.8 % — ABNORMAL HIGH (ref 11.5–15.5)
WBC: 4.1 10*3/uL (ref 4.0–10.5)

## 2013-11-21 LAB — GLUCOSE, CAPILLARY
GLUCOSE-CAPILLARY: 106 mg/dL — AB (ref 70–99)
GLUCOSE-CAPILLARY: 165 mg/dL — AB (ref 70–99)
Glucose-Capillary: 100 mg/dL — ABNORMAL HIGH (ref 70–99)
Glucose-Capillary: 162 mg/dL — ABNORMAL HIGH (ref 70–99)

## 2013-11-21 LAB — BASIC METABOLIC PANEL
BUN: 22 mg/dL (ref 6–23)
CALCIUM: 8.7 mg/dL (ref 8.4–10.5)
CO2: 22 meq/L (ref 19–32)
Chloride: 103 mEq/L (ref 96–112)
Creatinine, Ser: 1.6 mg/dL — ABNORMAL HIGH (ref 0.50–1.10)
GFR calc Af Amer: 35 mL/min — ABNORMAL LOW (ref >90)
GFR calc non Af Amer: 30 mL/min — ABNORMAL LOW (ref >90)
GLUCOSE: 109 mg/dL — AB (ref 70–99)
POTASSIUM: 4.3 meq/L (ref 3.7–5.3)
SODIUM: 136 meq/L — AB (ref 137–147)

## 2013-11-21 MED ORDER — SERTRALINE HCL 100 MG PO TABS
100.0000 mg | ORAL_TABLET | Freq: Every day | ORAL | Status: DC
  Administered 2013-11-21 – 2013-11-23 (×3): 100 mg via ORAL
  Filled 2013-11-21 (×4): qty 1

## 2013-11-21 NOTE — Progress Notes (Signed)
Thank you for consult on Tara Huff. Chart reviewed--especially issues regarding social concerns. PT/OT recommending SNF  for follow up therapies.  Agree with current recommendation and will defer consult for now.

## 2013-11-21 NOTE — Evaluation (Signed)
Occupational Therapy Evaluation Patient Details Name: AIDEEN FENSTER MRN: [id number] DOB: 07-17-1934 Today's Date: 11/21/2013 Time: 3875-6433 OT Time Calculation (min): 24 min  OT Assessment / Plan / Recommendation History of present illness 80 year old chronically ill appearing female w/ multiple co-morbids: AF, DM, OSA, CHF, states w/c bound since a fall several months ago. Admitted to [facility] ER 1/6 after being found by husband w/ AMS in the morning. Per EMS report has had some episodes of AMS and slurred speech for last few weeks. Shortly after EMS arrival she developed bradycardic arrest. ACLS and pacer started in field w/ ROSC. On arrival to ER found to be acidotic, w/ severe anemia. Brought to cath lab for temp pacer. Cards felt bradycardia likely metabolic in origin. PCCM asked to admit.    Clinical Impression   Pt presents today with very limited activity tolerance and significantly decreased strength. At baseline, pt only transferring to w/c and has not been walking for several months per her report. Pt has fear of falling & husband works during the day. Pt will benefit from continued acute OT services in order to address deficits as listed below in preparation for d/c to next venue. Currently recommending STR at SNF facility (pt is open to this, however would prefer home if progresses/able).      OT Assessment  Patient needs continued OT Services    Follow Up Recommendations  SNF;Supervision/Assistance - 24 hour    Barriers to Discharge Decreased caregiver support Pt reports husband works during day, pt alone w/ intermittent assist only.  Equipment Recommendations  Other (comment) (Defer to next venue)    Recommendations for Other Services    Frequency  Min 2X/week    Precautions / Restrictions Precautions Precautions: Fall Precaution Comments: Pt with wound on back of R LE, very decreased mobility at baseline Restrictions Weight Bearing Restrictions: No   Pertinent  Vitals/Pain Pt reports no c/o pain. Reports fatigue/lethergy.    ADL  Eating/Feeding: Performed;Set up;Minimal assistance Where Assessed - Eating/Feeding: Chair Grooming: Performed;Wash/dry hands;Wash/dry face;Set up Where Assessed - Grooming: Supported sitting Upper Body Bathing: Simulated;Moderate assistance Where Assessed - Upper Body Bathing: Supported sitting Lower Body Bathing: Simulated;+2 Total assistance Where Assessed - Lower Body Bathing: Supported sitting Upper Body Dressing: Simulated;Moderate assistance Where Assessed - Upper Body Dressing: Supported sitting Lower Body Dressing: Simulated;+2 Total assistance Where Assessed - Lower Body Dressing: Supported sit to stand;Supported sitting Toilet Transfer: Haematologist Method: Arts development officer: Extra wide drop arm bedside commode Toileting - Water quality scientist and Hygiene: Simulated;+2 Total assistance Where Assessed - Best boy and Hygiene: Sit to stand from 3-in-1 or toilet Tub/Shower Transfer Method: Not assessed Equipment Used: Gait belt;Rolling walker Transfers/Ambulation Related to ADLs: Pt is currently +2 total assist for SPT, very frail w/ decreased activity tolerance. Pt was w/ very little mobility at baseline, transferring from bed/chair to w/c, not ambulating. ADL Comments: Pt was seen for OT assessment. Pt was educated verbally in role of OT and performed grooming, self feeding & ADL tasks. Pt w/ very little activity tolerance, husband works during day and pt prefers to d/c home but is agreeable to SNF. Discussed SNF Rehab recommendations at length w/ pt.     OT Diagnosis: Generalized weakness  OT Problem List: Decreased strength;Decreased activity tolerance;Impaired balance (sitting and/or standing);Decreased knowledge of precautions;Decreased knowledge of use of DME or AE;Impaired UE functional use OT Treatment Interventions:  Self-care/ADL training;Energy conservation;DME and/or AE instruction;Patient/family education;Therapeutic activities;Balance training  OT Goals(Current goals can be found in the care plan section) Acute Rehab OT Goals Patient Stated Goal: Home  Time For Goal Achievement: 12/05/13 Potential to Achieve Goals: Fair  Visit Information  Last OT Received On: 11/21/13 Assistance Needed: +2 History of Present Illness: 80 year old chronically ill appearing female w/ multiple co-morbids: AF, DM, OSA, CHF, states w/c bound since a fall several months ago. Admitted to [facility] ER 1/6 after being found by husband w/ AMS in the morning. Per EMS report has had some episodes of AMS and slurred speech for last few weeks. Shortly after EMS arrival she developed bradycardic arrest. ACLS and pacer started in field w/ ROSC. On arrival to ER found to be acidotic, w/ severe anemia. Brought to cath lab for temp pacer. Cards felt bradycardia likely metabolic in origin. PCCM asked to admit.        Prior [place] expects to be discharged to:: Private residence Living Arrangements: Spouse/significant other Available Help at Discharge: Family;Available PRN/intermittently Type of Home: House Home Access: Stairs to enter;Other (comment) (Chair lift in garage) Entrance Stairs-Number of Steps: 3-4 STE  Home Layout: Two level;Able to live on main level with bedroom/bathroom Home Equipment: Wheelchair - manual;None;Walker - 4 wheels;Bedside commode;Grab bars - tub/shower;Tub bench Additional Comments: pt sponge bathes, has walk in shower, but has fear of falling, therefore does not use, uses half bath with elevated toilet seat and pushes from toilet to stand Prior Function Level of Independence: Independent with assistive device(s) Comments: Husband has been cleaning, pt has been doing cooking. Communication Communication: No difficulties Dominant Hand: Right    Vision/Perception  Vision - History Baseline Vision: Wears glasses all the time Patient Visual Report: No change from baseline   Cognition  Cognition Arousal/Alertness: Awake/alert Behavior During Therapy: WFL for tasks assessed/performed Overall Cognitive Status: Within Functional Limits for tasks assessed    Extremity/Trunk Assessment Upper Extremity Assessment Upper Extremity Assessment: Generalized weakness;LUE deficits/detail;RUE deficits/detail RUE Deficits / Details: Pt reports falling on right shoulder and decreased ability to perform shoulder flexion. H/O arthritis. RUE: Unable to fully assess due to pain RUE Coordination: decreased gross motor LUE Deficits / Details: H/O arthritis "for years", "Ball on bone" per pt reports. No AROM for shoulder flexion, pt resists all PROM noted. Crepetis noted LUE (wrist, hand & anticipate shoulder as well) LUE: Unable to fully assess due to pain LUE Coordination: decreased gross motor Lower Extremity Assessment Lower Extremity Assessment: Generalized weakness Cervical / Trunk Assessment Cervical / Trunk Assessment: Kyphotic    Mobility Bed Mobility Overal bed mobility: Needs Assistance Bed Mobility: Supine to Sit Supine to sit: Mod assist;HOB elevated General bed mobility comments: Pt able to begin to bring LEs out of bed, however is unable to push through LUE due to shoulder issues, therefore requires assist via bed pad to scoot hips to EOB.  Transfers Overall transfer level: Needs assistance Equipment used: Rolling walker (2 wheeled) Transfers: Sit to/from [facility] Sit to Stand: Max assist Stand pivot transfers: Mod assist General transfer comment: Pt requires max assist from elevated surface w/ max vc's for hand placement and increased forward lean. Pt requires manual/physical assist for forward lean at back & hips as well as controlled decent to chair. Pt very anxious and fear of falling +2 recommended for safety.        Balance  Balance Overall balance assessment: Needs assistance Sitting-balance support: Bilateral upper extremity supported;Feet supported Sitting balance-Leahy Scale: Fair Standing balance  support: Bilateral upper extremity supported Standing balance-Leahy Scale: Fair Standing balance comment: Pt able to stand w/ RW & Mod assist   End of Session OT - End of Session Equipment Utilized During Treatment: Gait belt;Rolling walker Activity Tolerance: Patient limited by lethargy;Patient limited by fatigue Patient left: in chair;with call bell/phone within reach;Other (comment) (Eating lunch)  GO     Almyra Deforest 11/21/2013, 12:51 PM

## 2013-11-21 NOTE — Progress Notes (Addendum)
Chart reviewed.  [facility]  CHARMIKA MERVINE WVP:[id number] DOB: [birth year] DOA: 11/14/2013 PCP: Allean Found, MD  Brief narrative: 80 year old female patient who presented to the ER after being resuscitated by EMS for bradycardic arrest. Upon arrival to the ER she was alert breathing on her own and being externally paced. Cardiology was consulted and a transvenous temporary place maker was placed in the emergency department due to patient noted to be in complete heart block. In addition the patient was found to be hypothermic with metabolic acidosis, hyperkalemia, acute renal failure and hypotension/shock. Prior to admission she was on chronic beta blocker and diltiazem for rate control for her underlying atrial fibrillation. She also appeared to be septic. She was subsequently admitted to the ICU by the PCCM service  After admission it was felt that her heart block was primarily related to multiple metabolic derangements. Patient did require pressor support transiently until her bradycardia/heart block resolved. No additional ischemic workup indicated. Patient's heart rate recovered and the pacemaker was removed and cardiology signed off. TTE showed an EF of 40% which was lower than EF in June 2014.  Urinalysis was positive for Escherichia coli and this was likely the source of her presumed septic symptoms.  Patient was subsequently transferred out of the ICU to the step down unit on 11/19/2013, to floor 1/12.  SIGNIFICANT EVENTS / STUDIES:  1/6 Cath lab >>> Transvenous pacer placement  1/6 LE venous Dopplers>>> prelim neg for DVT  1/6 TTE >>> EF 40%, LA mod dilated, RA severely dilated, mod-severe tricuspid regurg  1/7 Temp pacer removed  1/10 Off vasopressors  1/11 Transfer to SDU  1/12 TRH to pick up  HPI/Subjective: Weak. No dyspnea.  Assessment/Plan:    Sepsis/Escherichia coli UTI Continue rocephin    Complete heart block -Resolved with multifactorial etiology as  described above    Acute renal failure/CKD 3-4 -Resolved -Baseline BUN/creatinine 23/106 with a GFR of 50    Hypothermia/Metabolic acidosis -Resolved: related to sepsis as well as acute renal failure    PULMONARY HYPERTENSION/Sleep apnea -Echo this admission revealed severely dilated right atrium and moderate to severe tricuspid regurgitation   Chronic systolic heart failure EF 40% -Echo this admission with progression but this may have been related to preadmission hypotension and bradycardia -Will need followup echo at some point as an outpatient to see if he has recovered    Type 2 diabetes mellitus -CBGs controlled -Was on Amaryl and Glucophage prior to admission - Continue sliding scale insulin for now -Hemoglobin A1c 10.6 in June-repeat this admission   HTN -Was on Dilacor, Vasotec, Lasix, Aldactone and Lopressor prior to the admit -BP soft so we'll not resume antihypertensives at this time  thrombycytopenia improving, secondary to infection    Morbid obesity  atrial fibrillation -Currently rate controlled off medication    History of MI (myocardial infarction)    Physical deconditioning SNF  DVT prophylaxis: SCDs Code Status: DO NOT RESUSCITATE Family Communication: husband by phone. Agrees with SNF Disposition Plan/Expected LOS: SNF  Consultants: Cardiology  Antibiotics: Vancomycin 1/6 >>> 1/11  Zosyn 1/6 >>> 1/12 Diflucan 1/6 >>> 1/11 Rocephin 1/12 >>>  Objective: Blood pressure 108/67, pulse 80, temperature 98.7 F (37.1 C), temperature source Axillary, resp. rate 20, height 5\' 8"  (1.727 m), weight 116.529 kg (256 lb 14.4 oz), SpO2 99.00%.  Intake/Output Summary (Last 24 hours) at 11/21/13 1328 Last data filed at 11/21/13 0953  Gross per 24 hour  Intake  372.5 ml  Output  1 ml  Net  371.5 ml   Exam: General: No acute respiratory distress Lungs: Clear to auscultation bilaterally without wheezes or crackles, RA Cardiovascular: Irregular  rate and atrial fibrillation rhythm without murmur gallop or rub normal S1 and S2,  Abdomen: Nontender, nondistended, soft, bowel sounds positive, no rebound, no ascites, no appreciable mass Musculoskeletal: No significant cyanosis, clubbing of bilateral lower extremities. Nodular brawny hyperpigmented legs  Scheduled Meds:  Scheduled Meds: . antiseptic oral rinse  15 mL Mouth Rinse QID  . aspirin EC  81 mg Oral Daily  . cefTRIAXone (ROCEPHIN)  IV  1 g Intravenous Q24H  . chlorhexidine  15 mL Mouth/Throat BID  . Gerhardt's butt cream   Topical BID  . insulin aspart  0-5 Units Subcutaneous QHS  . insulin aspart  0-9 Units Subcutaneous TID WC   Data Reviewed: Basic Metabolic Panel:  Recent Labs Lab 11/17/13 0410 11/18/13 0615 11/19/13 0409 11/20/13 0855 11/21/13 0400  NA 136* 135* 135* 135* 136*  K 5.2 4.6 4.4 4.2 4.3  CL 103 103 104 103 103  CO2 18* 20 18* 17* 22  GLUCOSE 107* 100* 97 68* 109*  BUN 42* 38* 33* 26* 22  CREATININE 2.13* 1.96* 1.90* 1.73* 1.60*  CALCIUM 8.7 8.8 8.2* 8.4 8.7   Liver Function Tests:  Recent Labs Lab 11/15/13 1145  AST 34  ALT 17  ALKPHOS 133*  BILITOT 0.7  PROT 6.1  ALBUMIN 2.2*   CBC:  Recent Labs Lab 11/17/13 0410 11/18/13 0615 11/19/13 0409 11/20/13 0855 11/21/13 0400  WBC 11.1* 7.5 5.2 3.6* 4.1  HGB 11.8* 11.3* 10.6* 10.4* 11.3*  HCT 34.0* 32.7* 30.6* 29.5* 32.5*  MCV 91.4 90.8 90.8 90.5 91.3  PLT 84* 74* 60* 62* 82*   Cardiac Enzymes:  Recent Labs Lab 11/14/13 1602 11/14/13 2202 11/17/13 0646  CKTOTAL  --   --  32  TROPONINI <0.30 <0.30  --    BNP (last 3 results)  Recent Labs  04/27/13 1733 11/14/13 1002  PROBNP 2160.0* 1616.0*   CBG:  Recent Labs Lab 11/20/13 1122 11/20/13 1635 11/20/13 2138 11/21/13 0733 11/21/13 1112  GLUCAP 107* 142* 165* 100* 106*    Recent Results (from the past 240 hour(s))  CULTURE, BLOOD (ROUTINE X 2)     Status: None   Collection Time    11/14/13 12:31 PM       Result Value Range Status   Specimen Description BLOOD RIGHT HAND   Final   Special Requests BOTTLES DRAWN AEROBIC ONLY 10CC   Final   Culture  Setup Time     Final   Value: 11/14/2013 16:21     Performed at [facility]   Culture     Final   Value: NO GROWTH 5 DAYS     Performed at [facility]   Report Status 11/20/2013 FINAL   Final  CULTURE, BLOOD (ROUTINE X 2)     Status: None   Collection Time    11/14/13 12:40 PM      Result Value Range Status   Specimen Description BLOOD LEFT HAND   Final   Special Requests BOTTLES DRAWN AEROBIC ONLY 5CC   Final   Culture  Setup Time     Final   Value: 11/14/2013 16:44     Performed at [facility]   Culture     Final   Value: NO GROWTH 5 DAYS     Performed at [facility]   Report Status  11/20/2013 FINAL   Final  URINE CULTURE     Status: None   Collection Time    11/14/13  2:16 PM      Result Value Range Status   Specimen Description URINE, CATHETERIZED   Final   Special Requests Normal   Final   Culture  Setup Time     Final   Value: 11/14/2013 20:29     Performed at [place]     Final   Value: >=100,000 COLONIES/ML     Performed at [facility]   Culture     Final   Value: ESCHERICHIA COLI     Performed at [facility]   Report Status 11/17/2013 FINAL   Final   Organism ID, Bacteria ESCHERICHIA COLI   Final  MRSA PCR SCREENING     Status: Abnormal   Collection Time    11/14/13  2:17 PM      Result Value Range Status   MRSA by PCR POSITIVE (*) NEGATIVE Final   Comment:            The GeneXpert MRSA Assay (FDA     approved for NASAL specimens     only), is one component of a     comprehensive MRSA colonization     surveillance program. It is not     intended to diagnose MRSA     infection nor to guide or     monitor treatment for     MRSA infections.     RESULT CALLED TO, READ BACK BY AND VERIFIED WITH:     C. RICE RN 17:05 11/14/13 ([email or web address])   CLOSTRIDIUM DIFFICILE BY PCR     Status: None   Collection Time    11/17/13  5:32 AM      Result Value Range Status   C difficile by pcr NEGATIVE  NEGATIVE Final     Studies:  Recent x-ray studies have been reviewed in detail by the Attending Physician  Time spent : 29mins Doree Barthel, M.D. [facility] [phone]  If 7PM-7AM, please contact night-coverage [email or web address] Password [email or web address] 11/21/2013, 1:28 PM   LOS: 7 days

## 2013-11-21 NOTE — Evaluation (Signed)
Physical Therapy Evaluation Patient Details Name: Tara Huff MRN: [id number] DOB: 12/06/1933 Today's Date: 11/21/2013 Time: 5638-7564 PT Time Calculation (min): 50 min  PT Assessment / Plan / Recommendation History of Present Illness  80 year old chronically ill appearing female w/ multiple co-morbids: AF, DM, OSA, CHF, states w/c bound since a fall several months ago. Admitted to [facility] ER 1/6 after being found by husband w/ AMS in the morning. Per EMS report has had some episodes of AMS and slurred speech for last few weeks. Shortly after EMS arrival she developed bradycardic arrest. ACLS and pacer started in field w/ ROSC. On arrival to ER found to be acidotic, w/ severe anemia. Brought to cath lab for temp pacer. Cards felt bradycardia likely metabolic in origin. PCCM asked to admit.   Clinical Impression  Pt presents today with very limited activity tolerance and very decreased strength.  At baseline, pt only transferring to w/c and has not been walking for several months.  Feel pt will benefit from continued acute services in order to address deficits.  Feel pt is not appropriate for CIR at this time due to decreased mobility at baseline and lack of support during the day as husband works all day.  Discussed in length with pt going to SNF short term before returning home and seemed open to option if she had to.      PT Assessment  Patient needs continued PT services    Follow Up Recommendations  SNF;Supervision/Assistance - 24 hour    Does the patient have the potential to tolerate intense rehabilitation      Barriers to Discharge Decreased caregiver support      Equipment Recommendations  Rolling walker with 5" wheels (bari RW)    Recommendations for Other Services OT consult   Frequency Min 3X/week    Precautions / Restrictions Precautions Precautions: Fall Precaution Comments: Pt with wound on back of R LE, very decreased mobility at baseline Restrictions Weight Bearing  Restrictions: No   Pertinent Vitals/Pain Pt with mild c/o pain in L shoulder during mobility.       Mobility  Bed Mobility Overal bed mobility: Needs Assistance Bed Mobility: Supine to Sit Supine to sit: Mod assist;HOB elevated General bed mobility comments: Pt able to begin to bring LEs out of bed, however is unable to push through LUE due to shoulder issues, therefore requires assist via bed pad to scoot hips to EOB.  Transfers Overall transfer level: Needs assistance Equipment used: Rolling walker (2 wheeled) Transfers: Sit to/from [facility] Sit to Stand: Max assist Stand pivot transfers: Mod assist General transfer comment: Pt requires max assist (esp if surface is not elevated) with max cues for hand placement and increased forward lean.  Provided manual assist for increased forward lean at back and hips.  Also requires assist for controlled descent into chair.  Note pt gets anxious with being upright and requires cues for calming for safety.  Took a couple of steps forward, however was unable to continue and pivoted over to chair with max cues for safety.  Ambulation/Gait Ambulation/Gait assistance: Mod assist Ambulation Distance (Feet): 3 Feet Assistive device: Rolling walker (2 wheeled) Gait Pattern/deviations: Step-to pattern;Shuffle;Wide base of support General Gait Details: Only able to take a few small steps due to increased fatigue and weakness.     Exercises     PT Diagnosis: Difficulty walking;Abnormality of gait;Generalized weakness  PT Problem List: Decreased strength;Decreased range of motion;Decreased activity tolerance;Decreased balance;Decreased mobility;Decreased coordination;Decreased knowledge of  use of DME;Decreased safety awareness;Decreased knowledge of precautions;Decreased skin integrity;Obesity PT Treatment Interventions: DME instruction;Gait training;Functional mobility training;Therapeutic activities;Therapeutic exercise;Balance  training;Patient/family education     PT Goals(Current goals can be found in the care plan section) Acute Rehab PT Goals Patient Stated Goal: to return home PT Goal Formulation: With patient Time For Goal Achievement: 11/28/13 Potential to Achieve Goals: Good  Visit Information  Last PT Received On: 11/21/13 Assistance Needed: +2 (+2 if attempting gait) History of Present Illness: 80 year old chronically ill appearing female w/ multiple co-morbids: AF, DM, OSA, CHF, states w/c bound since a fall several months ago. Admitted to [facility] ER 1/6 after being found by husband w/ AMS in the morning. Per EMS report has had some episodes of AMS and slurred speech for last few weeks. Shortly after EMS arrival she developed bradycardic arrest. ACLS and pacer started in field w/ ROSC. On arrival to ER found to be acidotic, w/ severe anemia. Brought to cath lab for temp pacer. Cards felt bradycardia likely metabolic in origin. PCCM asked to admit.        Prior [place] expects to be discharged to:: Private residence Living Arrangements: Spouse/significant other Available Help at Discharge: Family;Available PRN/intermittently (husband works during the day) Type of Home: House Home Layout: Two level;Able to live on main level with bedroom/bathroom Home Equipment: Wheelchair - manual;None;Walker - 4 wheels;Bedside commode;Grab bars - tub/shower Additional Comments: pt sponge bathes, has walk in shower, but has fear of falling, therefore does not use, uses half bath with elevated toilet seat and pushes from toilet to stand Prior Function Level of Independence: Independent with assistive device(s) Comments: husband has been cleaning, pt has been doing cooking    Solicitor Arousal/Alertness: Awake/alert Behavior During Therapy: WFL for tasks assessed/performed Overall Cognitive Status: Within Functional Limits for tasks assessed    Extremity/Trunk Assessment  Lower Extremity Assessment Lower Extremity Assessment: Generalized weakness Cervical / Trunk Assessment Cervical / Trunk Assessment: Kyphotic   Balance Balance Overall balance assessment: Needs assistance Standing balance support: Bilateral upper extremity supported Standing balance-Leahy Scale: Fair Standing balance comment: Pt able to stand with use of RW at min assist.   End of Session PT - End of Session Equipment Utilized During Treatment: Gait belt Activity Tolerance: Patient tolerated treatment well;Patient limited by fatigue Patient left: in chair;with call bell/phone within reach Nurse Communication: Mobility status  GP     Denice Bors 11/21/2013, 10:01 AM

## 2013-11-22 DIAGNOSIS — F411 Generalized anxiety disorder: Secondary | ICD-10-CM

## 2013-11-22 LAB — CBC
HCT: 32.4 % — ABNORMAL LOW (ref 36.0–46.0)
Hemoglobin: 11.1 g/dL — ABNORMAL LOW (ref 12.0–15.0)
MCH: 31.3 pg (ref 26.0–34.0)
MCHC: 34.3 g/dL (ref 30.0–36.0)
MCV: 91.3 fL (ref 78.0–100.0)
Platelets: 110 10*3/uL — ABNORMAL LOW (ref 150–400)
RBC: 3.55 MIL/uL — AB (ref 3.87–5.11)
RDW: 15.8 % — ABNORMAL HIGH (ref 11.5–15.5)
WBC: 4.7 10*3/uL (ref 4.0–10.5)

## 2013-11-22 LAB — GLUCOSE, CAPILLARY
GLUCOSE-CAPILLARY: 165 mg/dL — AB (ref 70–99)
Glucose-Capillary: 112 mg/dL — ABNORMAL HIGH (ref 70–99)
Glucose-Capillary: 162 mg/dL — ABNORMAL HIGH (ref 70–99)
Glucose-Capillary: 134 mg/dL — ABNORMAL HIGH (ref 70–99)

## 2013-11-22 LAB — BASIC METABOLIC PANEL
BUN: 19 mg/dL (ref 6–23)
CALCIUM: 8.8 mg/dL (ref 8.4–10.5)
CO2: 21 mEq/L (ref 19–32)
Chloride: 104 mEq/L (ref 96–112)
Creatinine, Ser: 1.35 mg/dL — ABNORMAL HIGH (ref 0.50–1.10)
GFR calc Af Amer: 43 mL/min — ABNORMAL LOW (ref >90)
GFR, EST NON AFRICAN AMERICAN: 37 mL/min — AB (ref >90)
Glucose, Bld: 150 mg/dL — ABNORMAL HIGH (ref 70–99)
Potassium: 4.8 mEq/L (ref 3.7–5.3)
SODIUM: 136 meq/L — AB (ref 137–147)

## 2013-11-22 MED ORDER — GLUCERNA SHAKE PO LIQD
237.0000 mL | Freq: Two times a day (BID) | ORAL | Status: DC
  Administered 2013-11-22 – 2013-11-24 (×4): 237 mL via ORAL

## 2013-11-22 NOTE — Progress Notes (Signed)
NUTRITION FOLLOW UP  Intervention:   1) Glucerna bid; each shake provides 200 kcals and 10 grams protein per 8 fl ozs.  2) Nutritional Services Ambassador to assist patient with meal options.  Nutrition Dx:   Inadequate oral intake related to acute illness with poor appetite as evidenced by poor PO intake. Ongoing.  Goal:   Pt to meet >/= 90% of their estimated nutrition needs, unmet.   Monitor:   PO intake, weight, labs, I/O's   Assessment:   Patient with PMH of DM, CHF, severe OSA and A-fib; s/p bradycardic arrest in setting of metabolic acidosis and probable sepsis; patient underwent transvenous pacemaker placement.  Patient reports having a poor intake over the past 2 weeks. PTA patient reported consuming 2-3 small meals a day. Since admission, patient has a variable PO intake of 0-100% of meals per flowsheet records. Patient was unsure of any recent weight loss. Weight history on records indicate that weight is stable.    Height: Ht Readings from Last 1 Encounters:  11/14/13 5\' 8"  (1.727 m)    Weight Status:   Weight is stable  Wt Readings from Last 1 Encounters:  11/21/13 256 lb 14.4 oz (116.529 kg)  11/15/13         255 lb 11.7 oz (116 kg)   Re-estimated needs:  Kcal: 1800-2000  Protein: 95-105 gm  Fluid: 1.8-2.0 L  Skin: partial thickness fissure to sacral area   Diet Order: Dysphagia 3 with thin liquids   Intake/Output Summary (Last 24 hours) at 11/22/13 1025 Last data filed at 11/21/13 1410  Gross per 24 hour  Intake    240 ml  Output      0 ml  Net    240 ml    Last BM: 1/13  Labs:   Recent Labs Lab 11/20/13 0855 11/21/13 0400 11/22/13 0334  NA 135* 136* 136*  K 4.2 4.3 4.8  CL 103 103 104  CO2 17* 22 21  BUN 26* 22 19  CREATININE 1.73* 1.60* 1.35*  CALCIUM 8.4 8.7 8.8  GLUCOSE 68* 109* 150*    CBG (last 3)   Recent Labs  11/21/13 1710 11/21/13 2234 11/22/13 0736  GLUCAP 165* 162* 112*    Scheduled Meds: . antiseptic oral  rinse  15 mL Mouth Rinse QID  . aspirin EC  81 mg Oral Daily  . cefTRIAXone (ROCEPHIN)  IV  1 g Intravenous Q24H  . chlorhexidine  15 mL Mouth/Throat BID  . Gerhardt's butt cream   Topical BID  . insulin aspart  0-5 Units Subcutaneous QHS  . insulin aspart  0-9 Units Subcutaneous TID WC  . sertraline  100 mg Oral QHS    Continuous Infusions: . sodium chloride 20 mL/hr at 11/19/13 1200    Claudell Kyle, Dietetic Intern Pager: [phone]  I agree with student dietitian note; appropriate revisions have been made.  Molli Barrows, RD, LDN, [place] Pager# [phone] After Hours Pager# [phone]

## 2013-11-22 NOTE — Progress Notes (Signed)
Patient ID: Tara Huff  female  FTD:[id number]    DOB: 02-Sep-1934    DOA: 11/14/2013  PCP: Reginia Naas, MD  Assessment/Plan:   Sepsis/Escherichia coli UTI  Continue rocephin   Complete heart block  -Resolved with multifactorial etiology on admission secondary to metabolic acidosis, hyperkalemia, acute renal failure with septic shock  Acute renal failure/CKD 3-4  -Improving, creatinine 1.3  Hypothermia/Metabolic acidosis  -Resolved: related to sepsis as well as acute renal failure   PULMONARY HYPERTENSION/Sleep apnea  -Echo this admission revealed severely dilated right atrium and moderate to severe tricuspid regurgitation   Chronic systolic heart failure EF 40%  -Echo this admission with progression but this may have been related to preadmission hypotension and bradycardia  -Will need followup echo at some point as an outpatient to see if he has recovered   Type 2 diabetes mellitus  -CBGs controlled , hemoglobin A1c 6.6 -Was on Amaryl and Glucophage prior to admission, Continue sliding scale insulin for now   HTN  -Was on Dilacor, Vasotec, Lasix, Aldactone and Lopressor prior to the admit  -BP soft so we'll not resume antihypertensives at this time   thrombycytopenia improving, secondary to infection  Morbid obesity  atrial fibrillation  -Currently rate controlled   History of MI (myocardial infarction)  Physical deconditioning  SNF  DVT Prophylaxis: SCD's  Code Status:  Family Communication:  Disposition: when SNF bed available    Subjective: Feels better, denies any specific complaints  Objective: Weight change:   Intake/Output Summary (Last 24 hours) at 11/22/13 1331 Last data filed at 11/21/13 1410  Gross per 24 hour  Intake    240 ml  Output      0 ml  Net    240 ml   Blood pressure 127/80, pulse 71, temperature 98.2 F (36.8 C), temperature source Oral, resp. rate 20, height 5\' 8"  (1.727 m), weight 116.529 kg (256 lb 14.4 oz), SpO2  95.00%.  Physical Exam: General: Alert and awake, oriented x3, not in any acute distress. CVS: S1-S2 clear, no murmur rubs or gallops Chest: Irregularly iregular   Abdomen: soft nontender, nondistended, normal bowel sounds  Extremities: no cyanosis, clubbing or edema noted bilaterally Nodular brawny hyperpigmented legs   Lab Results: Basic Metabolic Panel:  Recent Labs Lab 11/21/13 0400 11/22/13 0334  NA 136* 136*  K 4.3 4.8  CL 103 104  CO2 22 21  GLUCOSE 109* 150*  BUN 22 19  CREATININE 1.60* 1.35*  CALCIUM 8.7 8.8   Liver Function Tests: No results found for this basename: AST, ALT, ALKPHOS, BILITOT, PROT, ALBUMIN,  in the last 168 hours No results found for this basename: LIPASE, AMYLASE,  in the last 168 hours No results found for this basename: AMMONIA,  in the last 168 hours CBC:  Recent Labs Lab 11/21/13 0400 11/22/13 0334  WBC 4.1 4.7  HGB 11.3* 11.1*  HCT 32.5* 32.4*  MCV 91.3 91.3  PLT 82* 110*   Cardiac Enzymes:  Recent Labs Lab 11/17/13 0646  CKTOTAL 32   BNP: No components found with this basename: POCBNP,  CBG:  Recent Labs Lab 11/21/13 1112 11/21/13 1710 11/21/13 2234 11/22/13 0736 11/22/13 1141  GLUCAP 106* 165* 162* 112* 162*     Micro Results: Recent Results (from the past 240 hour(s))  CULTURE, BLOOD (ROUTINE X 2)     Status: None   Collection Time    11/14/13 12:31 PM      Result Value Range Status   Specimen Description BLOOD  RIGHT HAND   Final   Special Requests BOTTLES DRAWN AEROBIC ONLY 10CC   Final   Culture  Setup Time     Final   Value: 11/14/2013 16:21     Performed at [facility]   Culture     Final   Value: NO GROWTH 5 DAYS     Performed at [facility]   Report Status 11/20/2013 FINAL   Final  CULTURE, BLOOD (ROUTINE X 2)     Status: None   Collection Time    11/14/13 12:40 PM      Result Value Range Status   Specimen Description BLOOD LEFT HAND   Final   Special Requests BOTTLES  DRAWN AEROBIC ONLY 5CC   Final   Culture  Setup Time     Final   Value: 11/14/2013 16:44     Performed at [facility]   Culture     Final   Value: NO GROWTH 5 DAYS     Performed at [facility]   Report Status 11/20/2013 FINAL   Final  URINE CULTURE     Status: None   Collection Time    11/14/13  2:16 PM      Result Value Range Status   Specimen Description URINE, CATHETERIZED   Final   Special Requests Normal   Final   Culture  Setup Time     Final   Value: 11/14/2013 20:29     Performed at [facility]     Final   Value: >=100,000 COLONIES/ML     Performed at [facility]   Culture     Final   Value: ESCHERICHIA COLI     Performed at [facility]   Report Status 11/17/2013 FINAL   Final   Organism ID, Bacteria ESCHERICHIA COLI   Final  MRSA PCR SCREENING     Status: Abnormal   Collection Time    11/14/13  2:17 PM      Result Value Range Status   MRSA by PCR POSITIVE (*) NEGATIVE Final   Comment:            The GeneXpert MRSA Assay (FDA     approved for NASAL specimens     only), is one component of a     comprehensive MRSA colonization     surveillance program. It is not     intended to diagnose MRSA     infection nor to guide or     monitor treatment for     MRSA infections.     RESULT CALLED TO, READ BACK BY AND VERIFIED WITH:     C. RICE RN 17:05 11/14/13 ([email or web address])  CLOSTRIDIUM DIFFICILE BY PCR     Status: None   Collection Time    11/17/13  5:32 AM      Result Value Range Status   C difficile by pcr NEGATIVE  NEGATIVE Final    Studies/Results: Dg Chest Port 1 View  11/17/2013   CLINICAL DATA:  Fever  EXAM: PORTABLE CHEST - 1 VIEW  COMPARISON:  11/15/2013  FINDINGS: Stable moderate cardiomegaly. Vascular congestion has resolved. No sign of pulmonary edema. Stable left internal jugular central venous catheter. No pneumothorax.  IMPRESSION: Cardiomegaly and resolved vascular congestion.   Electronically Signed    By: Maryclare Bean M.D.   On: 11/17/2013 08:03   Dg Chest Port 1 View  11/15/2013   CLINICAL DATA:  Central line placement  assessment  EXAM: PORTABLE CHEST - 1 VIEW  COMPARISON:  Portable chest x-ray of November 14, 2013  FINDINGS: The patient has undergone interval placement of a left internal jugular venous catheter. The tip of the catheter lies in the region of the midportion of the SVC. There is no evidence of a postprocedure pneumothorax or hemothorax. The cardiopericardial silhouette remains enlarged. The pulmonary vascularity remains engorged. The pulmonary interstitial markings are more prominent diffusely. There is no pleural effusion.  IMPRESSION: There is no evidence of a postprocedure complication following placement of the left internal jugular venous catheter. There are findings consistent with CHF with interstitial edema.   Electronically Signed   By: David  Martinique   On: 11/15/2013 13:01   Dg Chest Port 1 View  11/14/2013   CLINICAL DATA:  Cardiac arrhythmia  EXAM: PORTABLE CHEST - 1 VIEW  COMPARISON:  Study obtained earlier in the day  FINDINGS: There is a pacemaker lead in the right ventricular region. No pneumothorax.  There is no edema or consolidation. Heart is enlarged with pulmonary vascularity within normal limits. No adenopathy. There is arthropathy in both shoulders.  IMPRESSION: Stable cardiomegaly. No frank edema or consolidation. Pacemaker lead in region of right ventricle. No pneumothorax.   Electronically Signed   By: Lowella Grip M.D.   On: 11/14/2013 19:27   Dg Chest Port 1 View  11/14/2013   CLINICAL DATA:  80 year old female. Preoperative study, pacemaker placement. Initial encounter.  EXAM: PORTABLE CHEST - 1 VIEW  COMPARISON:  CT ANGIO CHEST AORTA W/CM \\T \/OR WO/CM dated 05/01/2013; DG CHEST 1V PORT dated 04/27/2013  FINDINGS: There is cardiomegaly. Other mediastinal contours are within normal limits. The patient is rotated to the right. No pneumothorax, pleural effusion or  consolidation identified. Continued pulmonary vascular congestion, appears increased compared to 04/27/2013.  IMPRESSION: Cardiomegaly and pulmonary vascular congestion / interstitial edema which appears increased compared to 04/27/2013. No pneumonia identified.   Electronically Signed   By: Lars Pinks M.D.   On: 11/14/2013 11:23    Medications: Scheduled Meds: . antiseptic oral rinse  15 mL Mouth Rinse QID  . aspirin EC  81 mg Oral Daily  . cefTRIAXone (ROCEPHIN)  IV  1 g Intravenous Q24H  . chlorhexidine  15 mL Mouth/Throat BID  . feeding supplement (GLUCERNA SHAKE)  237 mL Oral BID BM  . Gerhardt's butt cream   Topical BID  . insulin aspart  0-5 Units Subcutaneous QHS  . insulin aspart  0-9 Units Subcutaneous TID WC  . sertraline  100 mg Oral QHS      LOS: 8 days   Maryori Weide M.D. [facility] 11/22/2013, 1:31 PM Pager: [phone]  If 7PM-7AM, please contact night-coverage [email or web address] Password [email or web address]

## 2013-11-23 LAB — GLUCOSE, CAPILLARY
GLUCOSE-CAPILLARY: 101 mg/dL — AB (ref 70–99)
Glucose-Capillary: 138 mg/dL — ABNORMAL HIGH (ref 70–99)
Glucose-Capillary: 129 mg/dL — ABNORMAL HIGH (ref 70–99)
Glucose-Capillary: 175 mg/dL — ABNORMAL HIGH (ref 70–99)

## 2013-11-23 MED ORDER — GLUCERNA SHAKE PO LIQD: 237.0000 mL | Freq: Two times a day (BID) | ORAL | Status: DC

## 2013-11-23 MED ORDER — OXYCODONE HCL 5 MG PO TABS: 5.0000 mg | ORAL_TABLET | Freq: Four times a day (QID) | ORAL | Status: DC | PRN

## 2013-11-23 MED ORDER — INSULIN ASPART 100 UNIT/ML ~~LOC~~ SOLN: 0.0000 [IU] | Freq: Three times a day (TID) | SUBCUTANEOUS | Status: DC

## 2013-11-23 MED ORDER — ALPRAZOLAM 0.25 MG PO TABS: 0.2500 mg | ORAL_TABLET | Freq: Every evening | ORAL | Status: DC | PRN

## 2013-11-23 MED ORDER — GERHARDT'S BUTT CREAM: 1.0000 "application " | TOPICAL_CREAM | Freq: Two times a day (BID) | CUTANEOUS | Status: DC

## 2013-11-23 MED ORDER — CIPROFLOXACIN HCL 250 MG PO TABS: 250.0000 mg | ORAL_TABLET | Freq: Two times a day (BID) | ORAL | Status: DC

## 2013-11-23 MED ORDER — NITROFURANTOIN MACROCRYSTAL 50 MG PO CAPS: 50.0000 mg | ORAL_CAPSULE | Freq: Every day | ORAL | Status: DC

## 2013-11-23 NOTE — Progress Notes (Signed)
Read, reviewed, edited and agree with student's findings and recommendations.  Shacora Zynda B. Luna Audia, PT, DPT #[phone]  

## 2013-11-23 NOTE — Progress Notes (Signed)
Patient asleep  Had to check monitor ringing asystole in checking her noted he gown wet and refused to have it changed at this time HR SR no distresses noted will check later to see if patient will allow to change her and ask Nurses Aid to assist

## 2013-11-23 NOTE — Discharge Summary (Signed)
Physician Discharge Summary  Patient ID: [name] MRN: [id number] DOB/AGE: [birth year] 80 y.o.  Admit date: 11/14/2013 Discharge date: 11/23/2013  Primary Care Physician:  Reginia Naas, MD  Discharge Diagnoses:    . Cardiac arrest with complete heart block, hypothermia  . sepsis with Escherichia coli UTI  . Acute renal failure with metabolic acidosis  . Type 2 diabetes mellitus . Sleep apnea . PULMONARY HYPERTENSION . Morbid obesity . chronic systolic CHF, EF 93%  . Campath-induced atrial fibrillation . MI (myocardial infarction) . Physical deconditioning . Thrombocytopenia, unspecified . CKD (chronic kidney disease), stage III  Consults:  Cardiology, Dr Acie Fredrickson                    Critical care, Dr Oliver Pila                    Wound care                     Inpatient rehabilitation     Recommendations for Outpatient Follow-up:  Discharge diet: dysphagia 3   Foam dressing to posterior thigh wound.  Change Q 5 days or PRN soiling. If dressings become frequently soiled, then  Discontinue foam and use barrier cream  to protect site.  Allergies:  No Known Allergies   Discharge Medications:   Medication List    STOP taking these medications       diltiazem 180 MG 24 hr capsule  Commonly known as:  DILACOR XR     enalapril 2.5 MG tablet  Commonly known as:  VASOTEC     furosemide 80 MG tablet  Commonly known as:  LASIX     glimepiride 2 MG tablet  Commonly known as:  AMARYL     metFORMIN 500 MG 24 hr tablet  Commonly known as:  GLUCOPHAGE XR     metoprolol 50 MG tablet  Commonly known as:  LOPRESSOR     potassium chloride SA 20 MEQ tablet  Commonly known as:  K-DUR,KLOR-CON     spironolactone 25 MG tablet  Commonly known as:  ALDACTONE      TAKE these medications       acetaminophen 500 MG tablet  Commonly known as:  TYLENOL  Take 1,000 mg by mouth 2 (two) times daily.     ALPRAZolam 0.25 MG tablet  Commonly known as:  XANAX  Take  1 tablet (0.25 mg total) by mouth at bedtime as needed for anxiety or sleep. For anxiety     aspirin 81 MG EC tablet  Take 1 tablet (81 mg total) by mouth daily.     ciprofloxacin 250 MG tablet  Commonly known as:  CIPRO  Take 1 tablet (250 mg total) by mouth 2 (two) times daily. X 2 more days     feeding supplement (GLUCERNA SHAKE) Liqd  Take 237 mLs by mouth 2 (two) times daily between meals.     Gerhardt's butt cream Crea  Apply 1 application topically 2 (two) times daily.     insulin aspart 100 UNIT/ML injection  Commonly known as:  novoLOG  Inject 0-9 Units into the skin 3 (three) times daily with meals. Sliding scale CBG 70 - 120: 0 units CBG 121 - 150: 1 unit,  CBG 151 - 200: 2 units,  CBG 201 - 250: 3 units,  CBG 251 - 300: 5 units,  CBG 301 - 350: 7 units,  CBG 351 - 400: 9 units   CBG >  400: 9 units and notify your MD     nitrofurantoin 50 MG capsule  Commonly known as:  MACRODANTIN  Take 1 capsule (50 mg total) by mouth at bedtime. Start after ciprofloxacin doses are completed     oxyCODONE 5 MG immediate release tablet  Commonly known as:  Oxy IR/ROXICODONE  Take 1 tablet (5 mg total) by mouth every 6 (six) hours as needed for severe pain.     sertraline 100 MG tablet  Commonly known as:  ZOLOFT  Take 100 mg by mouth at bedtime.     Vitamin D 1000 UNITS capsule  Take 1,000 Units by mouth daily.         Brief H and P: For complete details please refer to admission H and P, but in brief patient was admitted by critical care service on 11/14/2013 when patient presented with bradycardia arrest. Per the consult note from cardiology, patient was unable to provide any history and patient was found to be in moderate mental status and EMS was called. On  arrival, patient was noted to be markedly bradycardiac. While transporting the patient became unresponsive and lost her pulse, CPR was initiated and she was given IV epinephrine, paced transcutaneously. She has a history of  severe OSA, morbid obesity, intolerant of CPAP, atrial fibrillation.    Hospital Course:   Briefly 80 year old female patient who presented to the ER after being resuscitated by EMS for bradycardic arrest. Upon arrival to the ER she was alert, breathing on her own and being externally paced. Cardiology was consulted and a transvenous temporary place maker was placed in the emergency department due to patient noted to be in complete heart block. In addition the patient was found to be hypothermic with metabolic acidosis, hyperkalemia, acute renal failure and hypotension/shock. Prior to admission she was on chronic beta blocker and diltiazem for rate control for her underlying atrial fibrillation. She also appeared to be septic. She was subsequently admitted to the ICU by the critical care service.  After admission it was felt that her heart block was primarily related to multiple metabolic derangements. Patient did require pressor support transiently until her bradycardia/heart block resolved. Per Cardiology,no additional ischemic workup indicated. Patient's heart rate recovered and the pacemaker was removed and cardiology signed off. 2-D echo showed an EF of 40% which was lower than EF in June 2014.  Urinalysis was positive for Escherichia coli and this was likely the source of her presumed septic symptoms.  Patient was subsequently transferred out of the ICU to the step down unit on 11/19/2013, to floor 1/12.    Sepsis/Escherichia coli UTI  Patient was initially on broad antibiotic coverage and vasopressors. Subsequently the antibiotics were narrowed down to IV Rocephin, recommended for total of 10 days, per sensitivities, 2 additional days of ciprofloxacin. Given patient has history of recurrent UTIs and possibly this led to the septic shock and cardiac arrest event, patient is also recommended to be placed on prophylactic nitrofurantoin.  Complete heart block  -Resolved with multifactorial etiology  on admission secondary to metabolic acidosis, hyperkalemia, acute renal failure with septic shock. Patient had temporary transvenous pacer placement on 11/14/13 and was removed on 1/7.   Acute renal failure/CKD 3-4  -Improving, creatinine 1.3   Hypothermia/Metabolic acidosis  -Resolved: related to sepsis as well as acute renal failure   PULMONARY HYPERTENSION/Sleep apnea  -Echo this admission revealed severely dilated right atrium and moderate to severe tricuspid regurgitation   Chronic systolic heart failure EF 40%  -  Echo this admission with progression but this may have been related to preadmission hypotension and bradycardia. She will needfollowup echo at some point as an outpatient to see if this has recovered. Please make a followup appointment with cardiology, Dr Acie Fredrickson.     Type 2 diabetes mellitus  -CBGs controlled , hemoglobin A1c 6.6  -Was on Amaryl and Glucophage prior to admission, Continue sliding scale insulin for now   HTN  -Was on Cardizem, Vasotec, Lasix, Aldactone and Lopressor prior to the admit  -BP soft so we'll not resume antihypertensives at this time   thrombycytopenia improving, secondary to infection - improving Morbid obesity   atrial fibrillation  -Currently rate controlled  History of MI (myocardial infarction)    Day of Discharge BP 113/73  Pulse 98  Temp(Src) 98.4 F (36.9 C) (Oral)  Resp 20  Ht 5\' 8"  (1.727 m)  Wt 115.214 kg (254 lb)  BMI 38.63 kg/m2  SpO2 97%  Physical Exam: General: Alert and awake, oriented x3, not in any acute distress.  CVS: S1-S2 clear, no murmur rubs or gallops  Chest: Irregularly iregular  Abdomen: soft nontender, nondistended, normal bowel sounds  Extremities: no cyanosis, clubbing or edema noted bilaterally  Nodular brawny hyperpigmented legs  The results of significant diagnostics from this hospitalization (including imaging, microbiology, ancillary and laboratory) are listed below for reference.    LAB  RESULTS: Basic Metabolic Panel:  Recent Labs Lab 11/21/13 0400 11/22/13 0334  NA 136* 136*  K 4.3 4.8  CL 103 104  CO2 22 21  GLUCOSE 109* 150*  BUN 22 19  CREATININE 1.60* 1.35*  CALCIUM 8.7 8.8   Liver Function Tests: No results found for this basename: AST, ALT, ALKPHOS, BILITOT, PROT, ALBUMIN,  in the last 168 hours No results found for this basename: LIPASE, AMYLASE,  in the last 168 hours No results found for this basename: AMMONIA,  in the last 168 hours CBC:  Recent Labs Lab 11/21/13 0400 11/22/13 0334  WBC 4.1 4.7  HGB 11.3* 11.1*  HCT 32.5* 32.4*  MCV 91.3 91.3  PLT 82* 110*   Cardiac Enzymes:  Recent Labs Lab 11/17/13 0646  CKTOTAL 32   BNP: No components found with this basename: POCBNP,  CBG:  Recent Labs Lab 11/23/13 0759 11/23/13 1128  GLUCAP 101* 138*    Significant Diagnostic Studies:  Dg Chest Port 1 View  11/15/2013   CLINICAL DATA:  Central line placement assessment  EXAM: PORTABLE CHEST - 1 VIEW  COMPARISON:  Portable chest x-ray of November 14, 2013  FINDINGS: The patient has undergone interval placement of a left internal jugular venous catheter. The tip of the catheter lies in the region of the midportion of the SVC. There is no evidence of a postprocedure pneumothorax or hemothorax. The cardiopericardial silhouette remains enlarged. The pulmonary vascularity remains engorged. The pulmonary interstitial markings are more prominent diffusely. There is no pleural effusion.  IMPRESSION: There is no evidence of a postprocedure complication following placement of the left internal jugular venous catheter. There are findings consistent with CHF with interstitial edema.   Electronically Signed   By: David  Martinique   On: 11/15/2013 13:01   Dg Chest Port 1 View  11/14/2013   CLINICAL DATA:  Cardiac arrhythmia  EXAM: PORTABLE CHEST - 1 VIEW  COMPARISON:  Study obtained earlier in the day  FINDINGS: There is a pacemaker lead in the right ventricular  region. No pneumothorax.  There is no edema or consolidation. Heart is enlarged  with pulmonary vascularity within normal limits. No adenopathy. There is arthropathy in both shoulders.  IMPRESSION: Stable cardiomegaly. No frank edema or consolidation. Pacemaker lead in region of right ventricle. No pneumothorax.   Electronically Signed   By: Lowella Grip M.D.   On: 11/14/2013 19:27   Dg Chest Port 1 View  11/14/2013   CLINICAL DATA:  80 year old female. Preoperative study, pacemaker placement. Initial encounter.  EXAM: PORTABLE CHEST - 1 VIEW  COMPARISON:  CT ANGIO CHEST AORTA W/CM \\T \/OR WO/CM dated 05/01/2013; DG CHEST 1V PORT dated 04/27/2013  FINDINGS: There is cardiomegaly. Other mediastinal contours are within normal limits. The patient is rotated to the right. No pneumothorax, pleural effusion or consolidation identified. Continued pulmonary vascular congestion, appears increased compared to 04/27/2013.  IMPRESSION: Cardiomegaly and pulmonary vascular congestion / interstitial edema which appears increased compared to 04/27/2013. No pneumonia identified.   Electronically Signed   By: Lars Pinks M.D.   On: 11/14/2013 11:23    2D ECHO: Study Conclusions  - Left ventricle: Paradoxical septal motion may be related both to pulmonary hypertension and pacing. EF difficult to aseesss. I think 40% range. The cavity size was normal. Wall thickness was normal. - Mitral valve: Mild regurgitation. - Left atrium: The atrium was moderately dilated. - Right ventricle: The pacing wire is probably seen. The cavity size was moderately dilated. Systolic function was mildly to moderately reduced. - Right atrium: The atrium was severely dilated. - Tricuspid valve: The valve appears to be grossly normal. Moderate-severe regurgitation. - Pulmonary arteries: PA peak pressure: 53mm Hg (S).    Disposition and Follow-up: Discharge Orders   Future Orders Complete By Expires   Diet - low sodium heart healthy   As directed    Discharge instructions  As directed    Comments:     Discharge diet: dysphagia 3   Foam dressing to posterior thigh wound.  Change Q 5 days or PRN soiling. If dressings become frequently soiled, then  Discontinue foam and use barrier cream  to protect site.       DISPOSITION: Skilled nursing facility  DIET: Dysphagia 3 diet, thin liquids   DISCHARGE FOLLOW-UP Follow-up Information   Follow up with Reginia Naas, MD. Schedule an appointment as soon as possible for a visit in 10 days. (for hospital follow-up)    Specialty:  Family Medicine   Contact information:   [address] [place] [address] [phone]       Follow up with Darden Amber., MD. Schedule an appointment as soon as possible for a visit in 2 weeks.   Specialty:  Cardiology   Contact information:   [address] [place] [address] [phone]       Time spent on Discharge: 45 mins  Signed:   RAI,RIPUDEEP M.D. [facility] 11/23/2013, 1:13 PM Pager: [phone]

## 2013-11-23 NOTE — Clinical Social Work Placement (Addendum)
Clinical Social Work Department  CLINICAL SOCIAL WORK PLACEMENT NOTE    Patient: Tara Huff  Account Number: [id number]   Admit date: 11/14/13 Clinical Social Worker: Rhea Pink LCSWA Date/time: 11/22/13 11:30 AM  Clinical Social Work is seeking post-discharge placement for this patient at the following level of care: SKILLED NURSING (*CSW will update this form in Epic as items are completed)  01/14/15Patient/family provided with [place] Department of Clinical Social Work's list of facilities offering this level of care within the geographic area requested by the patient (or if unable, by the patient's family).  11/22/13 Patient/family informed of their freedom to choose among providers that offer the needed level of care, that participate in Medicare, Medicaid or managed care program needed by the patient, have an available bed and are willing to accept the patient.  11/22/13 Patient/family informed of [facility]' ownership interest in [facility], as well as of the fact that they are under no obligation to receive care at this facility.  PASARR submitted to EDS on Pre-exisitng PASARR number received from EDS on   FL2 transmitted to all facilities in geographic area requested by pt/family on 11/22/13 FL2 transmitted to all facilities within larger geographic area on  Patient informed that his/her managed care company has contracts with or will negotiate with certain facilities, including the following:  Patient/family informed of bed offers received: 11/23/13 Patient chooses bed at [facility] Physician recommends and patient chooses bed at  Patient to be transferred to on  11/24/2013 Patient to be transferred to facility by [facility] The following physician request were entered in Epic:  Additional Comments:

## 2013-11-23 NOTE — Progress Notes (Signed)
PT Cancellation Note  Patient Details Name: Tara Huff MRN: [id number] DOB: 10/19/1934   Cancelled Treatment:    Reason Eval/Treat Not Completed: Medical issues which prohibited therapy.  Patient feeling sick to her stomach, unable to eat today and not feeling able to participate in therapy.    Cordelia Poche, SPT Pager:  [phone]  Cordelia Poche 11/23/2013, 2:50 PM

## 2013-11-24 LAB — GLUCOSE, CAPILLARY
GLUCOSE-CAPILLARY: 103 mg/dL — AB (ref 70–99)
GLUCOSE-CAPILLARY: 137 mg/dL — AB (ref 70–99)

## 2013-11-24 MED ORDER — OXYCODONE HCL 5 MG PO TABS: 5.0000 mg | ORAL_TABLET | Freq: Four times a day (QID) | ORAL | Status: DC | PRN

## 2013-11-24 MED ORDER — SERTRALINE HCL 100 MG PO TABS: 100.0000 mg | ORAL_TABLET | Freq: Every day | ORAL | Status: DC

## 2013-11-24 MED ORDER — CIPROFLOXACIN HCL 250 MG PO TABS: 250.0000 mg | ORAL_TABLET | Freq: Two times a day (BID) | ORAL | Status: DC

## 2013-11-24 MED ORDER — ALPRAZOLAM 0.25 MG PO TABS: 0.2500 mg | ORAL_TABLET | Freq: Every evening | ORAL | Status: DC | PRN

## 2013-11-24 NOTE — Progress Notes (Signed)
Clinical social worker assisted with patient discharge to skilled nursing facility, [name].  CSW addressed all family questions and concerns. CSW copied chart and added all important documents. CSW also set up patient transportation with [facility]. Clinical Social Worker will sign off for now as social work intervention is no longer needed.  Remington Highbaugh, MSW, LCSWA [phone] 

## 2013-11-24 NOTE — Progress Notes (Signed)
Patient ID: Tara Huff  female  OZD:[id number]    DOB: [birth year]    DOA: 11/14/2013  PCP: Reginia Naas, MD  Assessment/Plan:   Sepsis/Escherichia coli UTI  Continue rocephin for now, oral Cipro for 2 days to DC   Complete heart block  -Resolved with multifactorial etiology on admission secondary to metabolic acidosis, hyperkalemia, acute renal failure with septic shock  Acute renal failure/CKD 3-4  -Improving, creatinine 1.3  Hypothermia/Metabolic acidosis  -Resolved: related to sepsis as well as acute renal failure   PULMONARY HYPERTENSION/Sleep apnea  -Echo this admission revealed severely dilated right atrium and moderate to severe tricuspid regurgitation   Chronic systolic heart failure EF 40%  -Echo this admission with progression but this may have been related to preadmission hypotension and bradycardia  -Will need followup echo at some point as an outpatient to see if he has recovered   Type 2 diabetes mellitus  -CBGs controlled , hemoglobin A1c 6.6 -Was on Amaryl and Glucophage prior to admission, Continue sliding scale insulin for now   HTN  -Was on Dilacor, Vasotec, Lasix, Aldactone and Lopressor prior to the admit  -BP soft so we'll not resume antihypertensives at this time   thrombycytopenia improving, secondary to infection  Morbid obesity  atrial fibrillation  -Currently rate controlled   History of MI (myocardial infarction)  Physical deconditioning  SNF  DVT Prophylaxis: SCD's  Code Status:  Family Communication:  Disposition:  DC summary done yesterday, 11/23/2013, still stands no changes    Subjective: Feels better, denies any specific complaints, Looking forward to rehabilitation  Objective: Weight change: -0.214 kg (-7.5 oz) No intake or output data in the 24 hours ending 11/24/13 1244 Blood pressure 106/89, pulse 83, temperature 98 F (36.7 C), temperature source Oral, resp. rate 18, height 5\' 8"  (1.727 m), weight 115 kg (253  lb 8.5 oz), SpO2 96.00%.  Physical Exam: General: Alert and awake, oriented x3, not in any acute distress. CVS: S1-S2 clear, no murmur rubs or gallops Chest: Irregularly iregular   Abdomen: soft NT, ND, NBS Extremities: no cyanosis, clubbing or edema noted bilaterally Nodular brawny hyperpigmented legs   Lab Results: Basic Metabolic Panel:  Recent Labs Lab 11/21/13 0400 11/22/13 0334  NA 136* 136*  K 4.3 4.8  CL 103 104  CO2 22 21  GLUCOSE 109* 150*  BUN 22 19  CREATININE 1.60* 1.35*  CALCIUM 8.7 8.8   Liver Function Tests: No results found for this basename: AST, ALT, ALKPHOS, BILITOT, PROT, ALBUMIN,  in the last 168 hours No results found for this basename: LIPASE, AMYLASE,  in the last 168 hours No results found for this basename: AMMONIA,  in the last 168 hours CBC:  Recent Labs Lab 11/21/13 0400 11/22/13 0334  WBC 4.1 4.7  HGB 11.3* 11.1*  HCT 32.5* 32.4*  MCV 91.3 91.3  PLT 82* 110*   Cardiac Enzymes: No results found for this basename: CKTOTAL, CKMB, CKMBINDEX, TROPONINI,  in the last 168 hours BNP: No components found with this basename: POCBNP,  CBG:  Recent Labs Lab 11/23/13 1128 11/23/13 1645 11/23/13 2130 11/24/13 0738 11/24/13 1203  GLUCAP 138* 129* 175* 103* 137*     Micro Results: Recent Results (from the past 240 hour(s))  URINE CULTURE     Status: None   Collection Time    11/14/13  2:16 PM      Result Value Range Status   Specimen Description URINE, CATHETERIZED   Final   Special Requests Normal  Final   Culture  Setup Time     Final   Value: 11/14/2013 20:29     Performed at [place]     Final   Value: >=100,000 COLONIES/ML     Performed at [facility]   Culture     Final   Value: ESCHERICHIA COLI     Performed at [facility]   Report Status 11/17/2013 FINAL   Final   Organism ID, Bacteria ESCHERICHIA COLI   Final  MRSA PCR SCREENING     Status: Abnormal   Collection Time     11/14/13  2:17 PM      Result Value Range Status   MRSA by PCR POSITIVE (*) NEGATIVE Final   Comment:            The GeneXpert MRSA Assay (FDA     approved for NASAL specimens     only), is one component of a     comprehensive MRSA colonization     surveillance program. It is not     intended to diagnose MRSA     infection nor to guide or     monitor treatment for     MRSA infections.     RESULT CALLED TO, READ BACK BY AND VERIFIED WITH:     C. RICE RN 17:05 11/14/13 ([email or web address])  CLOSTRIDIUM DIFFICILE BY PCR     Status: None   Collection Time    11/17/13  5:32 AM      Result Value Range Status   C difficile by pcr NEGATIVE  NEGATIVE Final    Studies/Results: Dg Chest Port 1 View  11/17/2013   CLINICAL DATA:  Fever  EXAM: PORTABLE CHEST - 1 VIEW  COMPARISON:  11/15/2013  FINDINGS: Stable moderate cardiomegaly. Vascular congestion has resolved. No sign of pulmonary edema. Stable left internal jugular central venous catheter. No pneumothorax.  IMPRESSION: Cardiomegaly and resolved vascular congestion.   Electronically Signed   By: Maryclare Bean M.D.   On: 11/17/2013 08:03   Dg Chest Port 1 View  11/15/2013   CLINICAL DATA:  Central line placement assessment  EXAM: PORTABLE CHEST - 1 VIEW  COMPARISON:  Portable chest x-ray of November 14, 2013  FINDINGS: The patient has undergone interval placement of a left internal jugular venous catheter. The tip of the catheter lies in the region of the midportion of the SVC. There is no evidence of a postprocedure pneumothorax or hemothorax. The cardiopericardial silhouette remains enlarged. The pulmonary vascularity remains engorged. The pulmonary interstitial markings are more prominent diffusely. There is no pleural effusion.  IMPRESSION: There is no evidence of a postprocedure complication following placement of the left internal jugular venous catheter. There are findings consistent with CHF with interstitial edema.   Electronically Signed   By: David   [place]   On: 11/15/2013 13:01   Dg Chest Port 1 View  11/14/2013   CLINICAL DATA:  Cardiac arrhythmia  EXAM: PORTABLE CHEST - 1 VIEW  COMPARISON:  Study obtained earlier in the day  FINDINGS: There is a pacemaker lead in the right ventricular region. No pneumothorax.  There is no edema or consolidation. Heart is enlarged with pulmonary vascularity within normal limits. No adenopathy. There is arthropathy in both shoulders.  IMPRESSION: Stable cardiomegaly. No frank edema or consolidation. Pacemaker lead in region of right ventricle. No pneumothorax.   Electronically Signed   By: Lowella Grip M.D.   On: 11/14/2013 19:27   Dg  Chest Port 1 View  11/14/2013   CLINICAL DATA:  80 year old female. Preoperative study, pacemaker placement. Initial encounter.  EXAM: PORTABLE CHEST - 1 VIEW  COMPARISON:  CT ANGIO CHEST AORTA W/CM \\T \/OR WO/CM dated 05/01/2013; DG CHEST 1V PORT dated 04/27/2013  FINDINGS: There is cardiomegaly. Other mediastinal contours are within normal limits. The patient is rotated to the right. No pneumothorax, pleural effusion or consolidation identified. Continued pulmonary vascular congestion, appears increased compared to 04/27/2013.  IMPRESSION: Cardiomegaly and pulmonary vascular congestion / interstitial edema which appears increased compared to 04/27/2013. No pneumonia identified.   Electronically Signed   By: Lars Pinks M.D.   On: 11/14/2013 11:23    Medications: Scheduled Meds: . antiseptic oral rinse  15 mL Mouth Rinse QID  . aspirin EC  81 mg Oral Daily  . cefTRIAXone (ROCEPHIN)  IV  1 g Intravenous Q24H  . chlorhexidine  15 mL Mouth/Throat BID  . feeding supplement (GLUCERNA SHAKE)  237 mL Oral BID BM  . Gerhardt's butt cream   Topical BID  . insulin aspart  0-5 Units Subcutaneous QHS  . insulin aspart  0-9 Units Subcutaneous TID WC  . sertraline  100 mg Oral QHS      LOS: 10 days   Georgiann Neider M.D. [facility] 11/24/2013, 12:44 PM Pager: [phone]  If  7PM-7AM, please contact night-coverage [email or web address] Password [email or web address]

## 2013-11-27 ENCOUNTER — Encounter: Payer: Self-pay | Admitting: Adult Health

## 2013-11-27 ENCOUNTER — Non-Acute Institutional Stay (SKILLED_NURSING_FACILITY): Payer: Medicare Other | Admitting: Adult Health

## 2013-11-27 DIAGNOSIS — R0789 Other chest pain: Secondary | ICD-10-CM

## 2013-11-27 DIAGNOSIS — I469 Cardiac arrest, cause unspecified: Secondary | ICD-10-CM

## 2013-11-27 DIAGNOSIS — F411 Generalized anxiety disorder: Secondary | ICD-10-CM

## 2013-11-27 DIAGNOSIS — R5381 Other malaise: Secondary | ICD-10-CM

## 2013-11-27 DIAGNOSIS — E1159 Type 2 diabetes mellitus with other circulatory complications: Secondary | ICD-10-CM

## 2013-11-27 DIAGNOSIS — I119 Hypertensive heart disease without heart failure: Secondary | ICD-10-CM

## 2013-11-27 DIAGNOSIS — R071 Chest pain on breathing: Secondary | ICD-10-CM

## 2013-11-27 DIAGNOSIS — I442 Atrioventricular block, complete: Secondary | ICD-10-CM

## 2013-11-27 NOTE — Progress Notes (Signed)
Patient ID: Tara Huff, female   DOB: Feb 23, 1934, 80 y.o.   MRN: [id number]     [facility]  No Known Allergies   Chief Complaint  Patient presents with  . Hospitalization Follow-up    HPI:  She had suffered cardiac arrest with complete heart block due to sepsis from uti. She is here for short term rehab with her goal to return back home. She was not ambulatory prior to her hospitalization but was wheelchair bound.  She is having chest wall pain from cpr.    Past Medical History  Diagnosis Date  . Hypertension   . Atrial fibrillation   . MI (myocardial infarction)   . Anxiety   . Hypercholesterolemia   . Shoulder pain   . Sleep apnea     intolerant to CPAP  . Type 2 diabetes mellitus   . OSA (obstructive sleep apnea)   . Hypertensive heart disease   . Morbid obesity   . Pyogenic granuloma   . Cancer     cervical  . Depression   . Pneumonia   . CHF (congestive heart failure)     Past Surgical History  Procedure Laterality Date  . Knee surgery      at age 10  . Cataract surgery      both eyes  . Abdominal hysterectomy    . Toe amputation      VITAL SIGNS BP 118/70  Pulse 92  Ht _0  (1.702 m)  Wt 249 lb (112.946 kg)  BMI 38.99 kg/m2   Patient's Medications  New Prescriptions   No medications on file  Previous Medications   ACETAMINOPHEN (TYLENOL) 500 MG TABLET    Take 1,000 mg by mouth 2 (two) times daily.   ALPRAZOLAM (XANAX) 0.25 MG TABLET    Take 1 tablet (0.25 mg total) by mouth at bedtime as needed for anxiety or sleep.   ASPIRIN EC 81 MG EC TABLET    Take 1 tablet (81 mg total) by mouth daily.   CHOLECALCIFEROL (VITAMIN D) 1000 UNITS CAPSULE    Take 1,000 Units by mouth daily.     FEEDING SUPPLEMENT, GLUCERNA SHAKE, (GLUCERNA SHAKE) LIQD    Take 237 mLs by mouth 2 (two) times daily between meals.   HYDROCORTISONE (GERHARDT'S BUTT CREAM) CREA    Apply 1 application topically 2 (two) times daily.   INSULIN LISPRO (HUMALOG) 100 UNIT/ML  INJECTION    Inject 5 Units into the skin 3 (three) times daily before meals. Give 5 units prior to meals for cbg >=150   NITROFURANTOIN (MACRODANTIN) 50 MG CAPSULE    Take 1 capsule (50 mg total) by mouth at bedtime. Start after ciprofloxacin doses are completed   OXYCODONE (OXY IR/ROXICODONE) 5 MG IMMEDIATE RELEASE TABLET    Take 1 tablet (5 mg total) by mouth every 6 (six) hours as needed for severe pain.   SERTRALINE (ZOLOFT) 100 MG TABLET    Take 1 tablet (100 mg total) by mouth at bedtime.  Modified Medications   No medications on file  Discontinued Medications   CIPROFLOXACIN (CIPRO) 250 MG TABLET    Take 1 tablet (250 mg total) by mouth 2 (two) times daily. X 2 more days   INSULIN ASPART (NOVOLOG) 100 UNIT/ML INJECTION    Inject 0-9 Units into the skin 3 (three) times daily with meals. Sliding scale CBG 70 - 120: 0 units CBG 121 - 150: 1 unit,  CBG 151 - 200: 2 units,  CBG 201 -  250: 3 units,  CBG 251 - 300: 5 units,  CBG 301 - 350: 7 units,  CBG 351 - 400: 9 units   CBG > 400: 9 units and notify your MD    SIGNIFICANT DIAGNOSTIC EXAMS  11-14-13: chest x-ray: Cardiomegaly and pulmonary vascular congestion / interstitial edema which appears increased compared to 04/27/2013. No pneumonia identified.  11-14-13: 2-d echo: Left ventricle: Paradoxical septal motion may be related both to pulmonary hypertension and pacing. EF difficult to aseesss. I think 40% range. The cavity size was normal. Wall thickness was normal. - Mitral valve: Mild regurgitation. - Left atrium: The atrium was moderately dilated. - Right ventricle: The pacing wire is probably seen. The cavity size was moderately dilated. Systolic function was mildly to moderately reduced. - Right atrium: The atrium was severely dilated. - Tricuspid valve: The valve appears to be grossly normal. Moderate-severe regurgitation.  11-15-13: chest x-ray: There is no evidence of a postprocedure complication following placement of the left internal  jugular venous catheter. There are findings consistent with CHF with interstitial edema.  11-15-13: bilateral lower extremity doppler: No evidence of deep vein thrombosis involving the visualized veins of the right lower extremity.- No evidence of deep vein thrombosis involving the visualized veins of the left lower extremity. - No evidence of Baker's cyst on the right or left.   11-17-13: chest x-ray: Cardiomegaly and resolved vascular congestion.    LABS REVIEWED:   11-14-13: wbc 3.9; hgb 5.9; hct 17.0; mcv 91.4; plt 50; glucose 286; bun 57; creat 2.50; k+5.3; na++135 BNP 1616 (repeat hgb 15.0; hct 44.0) 11-15-13:wbc 9.0; hgb 11.8; hct 331.; mcv 88.5 plt 111; glucose 106; bun 60; creat 2.49; k+5.2; na++141 Alk phos 133; albumin 2.2 11-20-13: wbc 3.6; hgb 10.4; hct 29.5; mcv 90.5; plt 62; gluocse 68; bun 26; creat 1.73; k+4.2; na++135  hgb a1c 6.6         Review of Systems  Constitutional: Positive for malaise/fatigue.  Respiratory: Negative for cough and shortness of breath.   Cardiovascular: Positive for chest pain and leg swelling. Negative for palpitations.       Has chest wall pain; has bilateral lower extremity edema present.   Gastrointestinal: Negative for heartburn, diarrhea and constipation.  Musculoskeletal: Negative for joint pain and myalgias.  Skin:       Has dry skin in bilateral lower extremities; has sore on posterior left thigh  Neurological: Positive for weakness.  Psychiatric/Behavioral: Negative for depression. The patient is not nervous/anxious.      .Physical Exam  Constitutional: She is oriented to person, place, and time. She appears well-developed and well-nourished. No distress.  obese  Neck: Neck supple. No JVD present.  Cardiovascular: Normal rate and intact distal pulses.   Heart rate irregular   Respiratory: Effort normal and breath sounds normal. No respiratory distress. She has no wheezes.  GI: Soft. Bowel sounds are normal. She exhibits no  distension. There is no tenderness.  Musculoskeletal: Normal range of motion.  Has generalized weakness present. Has 2 + lower extremity edema  Neurological: She is alert and oriented to person, place, and time.  Skin: Skin is warm and dry. She is not diaphoretic.  Bilateral lower extremities are discolored; with dry flaky skin; has open area on left posterior thigh without signs of infection present.        ASSESSMENT/ PLAN:  1. Diabetes: will continue humalog 5 units prior to meals for cbg >=150; will monitor her status;   2. hypertensive heart disease:  She is presently stable will continue her asa 81 mg daily and will monitor her status   3. Anxiety with depression: she is stable will continue xanxa 0.25 mg nightly for anxiety and will continue zoloft 100 mg daily   4. Chronic uti: no signs of infection present will continue long term macrobid 50 mg daily and will monitor   5. Chest wall pain: due to cpr: will continue her tylenol 1 gm three times daily and will continue oxycodone 5 mg every 6 hours as needed. Will monitor her status  6. Physical deconditioning: will continue therapy as directed; will continue her current plan of care and will continue to monitor her status.

## 2013-11-29 DIAGNOSIS — E1159 Type 2 diabetes mellitus with other circulatory complications: Secondary | ICD-10-CM | POA: Insufficient documentation

## 2013-11-29 DIAGNOSIS — R0789 Other chest pain: Secondary | ICD-10-CM | POA: Insufficient documentation

## 2013-11-30 ENCOUNTER — Encounter: Payer: Self-pay | Admitting: Internal Medicine

## 2013-11-30 ENCOUNTER — Non-Acute Institutional Stay (SKILLED_NURSING_FACILITY): Payer: Medicare Other | Admitting: Internal Medicine

## 2013-11-30 DIAGNOSIS — E118 Type 2 diabetes mellitus with unspecified complications: Secondary | ICD-10-CM

## 2013-11-30 DIAGNOSIS — B9620 Unspecified Escherichia coli [E. coli] as the cause of diseases classified elsewhere: Secondary | ICD-10-CM

## 2013-11-30 DIAGNOSIS — N390 Urinary tract infection, site not specified: Secondary | ICD-10-CM

## 2013-11-30 DIAGNOSIS — R071 Chest pain on breathing: Secondary | ICD-10-CM

## 2013-11-30 DIAGNOSIS — R0789 Other chest pain: Secondary | ICD-10-CM

## 2013-11-30 DIAGNOSIS — R5381 Other malaise: Secondary | ICD-10-CM

## 2013-11-30 DIAGNOSIS — A498 Other bacterial infections of unspecified site: Secondary | ICD-10-CM

## 2013-11-30 DIAGNOSIS — F419 Anxiety disorder, unspecified: Secondary | ICD-10-CM

## 2013-11-30 DIAGNOSIS — F341 Dysthymic disorder: Secondary | ICD-10-CM

## 2013-11-30 DIAGNOSIS — R531 Weakness: Secondary | ICD-10-CM

## 2013-11-30 DIAGNOSIS — R5383 Other fatigue: Secondary | ICD-10-CM

## 2013-11-30 DIAGNOSIS — F329 Major depressive disorder, single episode, unspecified: Secondary | ICD-10-CM

## 2013-11-30 NOTE — Progress Notes (Signed)
Patient ID: Tara Huff, female   DOB: 1934-04-04, 80 y.o.   MRN: [id number]    [facility]    PCP: Reginia Naas, MD  Code Status: full code  No Known Allergies  Chief Complaint: new admit  HPI:  80 y/o female pt is here from hospital for STR. She was in the hospital from 11/14/13- 11/23/13 with sepsis in setting of e.coli uti. She had cardiac arrest with complete heart block and hypothermia. She also had acute renal failure with metabolic acidosis. With her generalized weakness, she is here at [facility] for STR and goal is for her to return home. She is seen in her room today. She complaints of discomfort in right lower chest area, it worsens with deep breathing. Her energy is slowly improving but she is still weak. Denies any other complaints. No concern from staff  Review of Systems:   Constitutional: Negative for fever, chills, weight loss, diaphoresis.  HENT: Negative for congestion, hearing loss and sore throat.   Eyes: Negative for blurred vision, double vision and discharge.  Respiratory: Negative for cough, sputum production, shortness of breath and wheezing.   Cardiovascular: Negative for chest pain, palpitations, orthopnea and leg swelling.  Gastrointestinal: Negative for heartburn, nausea, vomiting, abdominal pain, diarrhea and constipation.  Genitourinary: Negative for dysuria, urgency, frequency and flank pain.  Musculoskeletal: Negative for back pain, falls, joint pain and myalgias.  Skin: Negative for itching and rash.  Neurological: Positive for weakness. Negative for dizziness, tingling, focal weakness and headaches.  Psychiatric/Behavioral: Negative for depression and memory loss. The patient is not nervous/anxious.    Past Medical History  Diagnosis Date  . Hypertension   . Atrial fibrillation   . MI (myocardial infarction)   . Anxiety   . Hypercholesterolemia   . Shoulder pain   . Sleep apnea     intolerant to CPAP  . Type 2 diabetes mellitus    . OSA (obstructive sleep apnea)   . Hypertensive heart disease   . Morbid obesity   . Pyogenic granuloma   . Cancer     cervical  . Depression   . Pneumonia   . CHF (congestive heart failure)    Past Surgical History  Procedure Laterality Date  . Knee surgery      at age 73  . Cataract surgery      both eyes  . Abdominal hysterectomy    . Toe amputation     Current Outpatient Prescriptions on File Prior to Visit  Medication Sig Dispense Refill  . acetaminophen (TYLENOL) 500 MG tablet Take 1,000 mg by mouth 2 (two) times daily.      Marland Kitchen ALPRAZolam (XANAX) 0.25 MG tablet Take 1 tablet (0.25 mg total) by mouth at bedtime as needed for anxiety or sleep.  20 tablet  0  . aspirin EC 81 MG EC tablet Take 1 tablet (81 mg total) by mouth daily.  30 tablet  3  . Cholecalciferol (VITAMIN D) 1000 UNITS capsule Take 1,000 Units by mouth daily.        . feeding supplement, GLUCERNA SHAKE, (GLUCERNA SHAKE) LIQD Take 237 mLs by mouth 2 (two) times daily between meals.    0  . Hydrocortisone (GERHARDT'S BUTT CREAM) CREA Apply 1 application topically 2 (two) times daily.      . insulin lispro (HUMALOG) 100 UNIT/ML injection Inject 5 Units into the skin 3 (three) times daily before meals. Give 5 units prior to meals for cbg >=150      .  nitrofurantoin (MACRODANTIN) 50 MG capsule Take 1 capsule (50 mg total) by mouth at bedtime. Start after ciprofloxacin doses are completed      . oxyCODONE (OXY IR/ROXICODONE) 5 MG immediate release tablet Take 1 tablet (5 mg total) by mouth every 6 (six) hours as needed for severe pain.  20 tablet  0  . sertraline (ZOLOFT) 100 MG tablet Take 1 tablet (100 mg total) by mouth at bedtime.  30 tablet  0   No current facility-administered medications on file prior to visit.   Social History:   reports that she quit smoking about 21 years ago. Her smoking use included Cigarettes. She has a 20 pack-year smoking history. She has never used smokeless tobacco. She reports  that she does not drink alcohol or use illicit drugs.  Family History  Problem Relation Age of Onset  . Emphysema Father   . Cancer Mother     intestinal    Medications: Patient's Medications  New Prescriptions   No medications on file  Previous Medications   ACETAMINOPHEN (TYLENOL) 500 MG TABLET    Take 1,000 mg by mouth 2 (two) times daily.   ALPRAZOLAM (XANAX) 0.25 MG TABLET    Take 1 tablet (0.25 mg total) by mouth at bedtime as needed for anxiety or sleep.   ASPIRIN EC 81 MG EC TABLET    Take 1 tablet (81 mg total) by mouth daily.   CHOLECALCIFEROL (VITAMIN D) 1000 UNITS CAPSULE    Take 1,000 Units by mouth daily.     FEEDING SUPPLEMENT, GLUCERNA SHAKE, (GLUCERNA SHAKE) LIQD    Take 237 mLs by mouth 2 (two) times daily between meals.   HYDROCORTISONE (GERHARDT'S BUTT CREAM) CREA    Apply 1 application topically 2 (two) times daily.   INSULIN LISPRO (HUMALOG) 100 UNIT/ML INJECTION    Inject 5 Units into the skin 3 (three) times daily before meals. Give 5 units prior to meals for cbg >=150   NITROFURANTOIN (MACRODANTIN) 50 MG CAPSULE    Take 1 capsule (50 mg total) by mouth at bedtime. Start after ciprofloxacin doses are completed   OXYCODONE (OXY IR/ROXICODONE) 5 MG IMMEDIATE RELEASE TABLET    Take 1 tablet (5 mg total) by mouth every 6 (six) hours as needed for severe pain.   SERTRALINE (ZOLOFT) 100 MG TABLET    Take 1 tablet (100 mg total) by mouth at bedtime.  Modified Medications   No medications on file  Discontinued Medications   No medications on file     Physical Exam: Filed Vitals:   11/30/13 1537  BP: 123/86  Pulse: 88  Temp: 97 F (36.1 C)  Resp: 18  SpO2: 99%   General- elderly female in no acute distress Head- atraumatic, normocephalic Eyes- PERRLA, EOMI, no pallor, no icterus, no discharge Neck- no lymphadenopathy, no thyromegaly, no jugular vein distension, no carotid bruit Chest- no chest wall deformities, right lower chest wall  tenderness Cardiovascular- normal s1,s2, no murmurs/ rubs/ gallops Respiratory- bilateral clear to auscultation, no wheeze, no rhonchi, no crackles Abdomen- bowel sounds present, soft, non tender Musculoskeletal- able to move all 4 extremities,weakness present Neurological- no focal deficit Psychiatry- alert and oriented to person, place and time, normal mood and affect, some anxiety noted    Labs reviewed: Basic Metabolic Panel:  Recent Labs  04/28/13 0518  11/20/13 0855 11/21/13 0400 11/22/13 0334  NA 135  < > 135* 136* 136*  K 3.5  < > 4.2 4.3 4.8  CL 100  < >  103 103 104  CO2 27  < > 17* 22 21  GLUCOSE 183*  < > 68* 109* 150*  BUN 17  < > 26* 22 19  CREATININE 1.01  < > 1.73* 1.60* 1.35*  CALCIUM 7.9*  < > 8.4 8.7 8.8  MG 1.6  --   --   --   --   < > = values in this interval not displayed. Liver Function Tests:  Recent Labs  04/27/13 1702 11/15/13 1145  AST 36 34  ALT 9 17  ALKPHOS 85 133*  BILITOT 0.9 0.7  PROT 5.4* 6.1  ALBUMIN 1.8* 2.2*   No results found for this basename: LIPASE, AMYLASE,  in the last 8760 hours No results found for this basename: AMMONIA,  in the last 8760 hours CBC:  Recent Labs  04/27/13 1702  04/28/13 0518 11/14/13 0830  11/20/13 0855 11/21/13 0400 11/22/13 0334  WBC 5.7  < > 4.7 3.9*  < > 3.6* 4.1 4.7  NEUTROABS 4.1  --  3.3 2.6  --   --   --   --   HGB 13.7  < > 12.7 5.9*  < > 10.4* 11.3* 11.1*  HCT 41.0  < > 37.7 17.0*  < > 29.5* 32.5* 32.4*  MCV 89.7  < > 88.7 91.4  < > 90.5 91.3 91.3  PLT 156  < > 139* 50*  < > 62* 82* 110*  < > = values in this interval not displayed. Cardiac Enzymes:  Recent Labs  11/14/13 1002 11/14/13 1602 11/14/13 2202 11/17/13 0646  CKTOTAL  --   --   --  32  TROPONINI <0.30 <0.30 <0.30  --    BNP: No components found with this basename: POCBNP,  CBG:  Recent Labs  11/23/13 2130 11/24/13 0738 11/24/13 1203  GLUCAP 175* 103* 137*    Radiological Exams: Dg Chest Port 1  View  11/15/2013   CLINICAL DATA:  Central line placement assessment  EXAM: PORTABLE CHEST - 1 VIEW  COMPARISON:  Portable chest x-ray of November 14, 2013  FINDINGS: The patient has undergone interval placement of a left internal jugular venous catheter. The tip of the catheter lies in the region of the midportion of the SVC. There is no evidence of a postprocedure pneumothorax or hemothorax. The cardiopericardial silhouette remains enlarged. The pulmonary vascularity remains engorged. The pulmonary interstitial markings are more prominent diffusely. There is no pleural effusion.  IMPRESSION: There is no evidence of a postprocedure complication following placement of the left internal jugular venous catheter. There are findings consistent with CHF with interstitial edema.   Electronically Signed   By: David  Martinique   On: 11/15/2013 13:01   Dg Chest Port 1 View  11/14/2013   CLINICAL DATA:  Cardiac arrhythmia  EXAM: PORTABLE CHEST - 1 VIEW  COMPARISON:  Study obtained earlier in the day  FINDINGS: There is a pacemaker lead in the right ventricular region. No pneumothorax.  There is no edema or consolidation. Heart is enlarged with pulmonary vascularity within normal limits. No adenopathy. There is arthropathy in both shoulders.  IMPRESSION: Stable cardiomegaly. No frank edema or consolidation. Pacemaker lead in region of right ventricle. No pneumothorax.   Electronically Signed   By: Lowella Grip M.D.   On: 11/14/2013 19:27   Dg Chest Port 1 View  11/14/2013   CLINICAL DATA:  80 year old female. Preoperative study, pacemaker placement. Initial encounter.  EXAM: PORTABLE CHEST - 1 VIEW  COMPARISON:  CT ANGIO  CHEST AORTA W/CM \\T \/OR WO/CM dated 05/01/2013; DG CHEST 1V PORT dated 04/27/2013  FINDINGS: There is cardiomegaly. Other mediastinal contours are within normal limits. The patient is rotated to the right. No pneumothorax, pleural effusion or consolidation identified. Continued pulmonary vascular congestion,  appears increased compared to 04/27/2013.  IMPRESSION: Cardiomegaly and pulmonary vascular congestion / interstitial edema which appears increased compared to 04/27/2013. No pneumonia identified.   Electronically Signed   By: Lars Pinks M.D.   On: 11/14/2013 11:23    2D ECHO: Study Conclusions  - Left ventricle: Paradoxical septal motion may be related both to pulmonary hypertension and pacing. EF difficult to aseesss. I think 40% range. The cavity size was normal. Wall thickness was normal. - Mitral valve: Mild regurgitation. - Left atrium: The atrium was moderately dilated. - Right ventricle: The pacing wire is probably seen. The cavity size was moderately dilated. Systolic function was mildly to moderately reduced. - Right atrium: The atrium was severely dilated. - Tricuspid valve: The valve appears to be grossly normal. Moderate-severe regurgitation. - Pulmonary arteries: PA peak pressure: 60mm Hg (S).   Assessment/Plan  Generalized weakness in setting of deconditioning from recent acute illness. To wor with PT, OT and SLP team., skin care, fall precautions, encourage to be OOB as tolerated  Right chest pain reproducible tenderness in right lower rib area, likely from resucitation in setting of cardiac arrest. Will get cxr to rule out rib fracture  Type 2 diabetes mellitus   a1c 6.6, continue humalog SSI for now  HTN bp at normal range at present. If remains elevated, to consider starting her home meds slowly. Monitor bp readings  Depression Continue zoloft 100 mg daily  E.coli UTI Completed antibiotic course. Encourage hydration and po intake. Continue prophylactic nitrofurantoin  Right upper thigh wound skin care to be continued, pressure ulcer prophylaxis to be taken  Anxiety- continue xanax for now   Family/ staff Communication: reviewed care plan with patient and nursing supervisor  Goals of care: return home after STR   Labs/tests ordered- cbc, cmp

## 2013-12-08 ENCOUNTER — Non-Acute Institutional Stay (SKILLED_NURSING_FACILITY): Payer: Medicare Other | Admitting: Adult Health

## 2013-12-08 ENCOUNTER — Encounter: Payer: Self-pay | Admitting: Adult Health

## 2013-12-08 DIAGNOSIS — I4729 Other ventricular tachycardia: Secondary | ICD-10-CM

## 2013-12-08 DIAGNOSIS — I219 Acute myocardial infarction, unspecified: Secondary | ICD-10-CM

## 2013-12-08 DIAGNOSIS — I480 Paroxysmal atrial fibrillation: Secondary | ICD-10-CM

## 2013-12-08 DIAGNOSIS — I4891 Unspecified atrial fibrillation: Secondary | ICD-10-CM

## 2013-12-08 DIAGNOSIS — I472 Ventricular tachycardia: Secondary | ICD-10-CM

## 2013-12-08 NOTE — Progress Notes (Signed)
Patient ID: Tara Huff, female   DOB: [birth year], 80 y.o.   MRN: [id number]     [facility]  No Known Allergies   Chief Complaint  Patient presents with  . Acute Visit    tachycardia; hypertension; gerd     HPI: Nursing staff is concerned about her tachycardia. Her heart rate is elevated at 120-130; her radial pulse rate is lower at 70-80. She is not voicing any complaints. She has a history of afib; is not a coumadin candidate.  Several years ago she took herself off this medication as she had bleeding in her sclera. She does not remember if she went to the eye doctor or if she discussed with her pcp or cardiologist.    Past Medical History  Diagnosis Date  . Hypertension   . Atrial fibrillation   . MI (myocardial infarction)   . Anxiety   . Hypercholesterolemia   . Shoulder pain   . Sleep apnea     intolerant to CPAP  . Type 2 diabetes mellitus   . OSA (obstructive sleep apnea)   . Hypertensive heart disease   . Morbid obesity   . Pyogenic granuloma   . Cancer     cervical  . Depression   . Pneumonia   . CHF (congestive heart failure)     Past Surgical History  Procedure Laterality Date  . Knee surgery      at age 47  . Cataract surgery      both eyes  . Abdominal hysterectomy    . Toe amputation      VITAL SIGNS BP 122/70  Pulse 130  Ht '5\' 6"'  (1.676 m)  Wt 260 lb 12.8 oz (118.298 kg)  BMI 42.11 kg/m2   Patient's Medications  New Prescriptions   No medications on file  Previous Medications   ACETAMINOPHEN (TYLENOL) 500 MG TABLET    Take 1,000 mg by mouth 2 (two) times daily.   ALPRAZOLAM (XANAX) 0.25 MG TABLET    Take 1 tablet (0.25 mg total) by mouth at bedtime as needed for anxiety or sleep.   ASPIRIN EC 81 MG EC TABLET    Take 1 tablet (81 mg total) by mouth daily.   CHOLECALCIFEROL (VITAMIN D) 1000 UNITS CAPSULE    Take 1,000 Units by mouth daily.     FEEDING SUPPLEMENT, GLUCERNA SHAKE, (GLUCERNA SHAKE) LIQD    Take 237 mLs by mouth 2  (two) times daily between meals.   HYDROCORTISONE (GERHARDT'S BUTT CREAM) CREA    Apply 1 application topically 2 (two) times daily.   INSULIN LISPRO (HUMALOG) 100 UNIT/ML INJECTION    Inject 5 Units into the skin 3 (three) times daily before meals. Give 5 units prior to meals for cbg >=150   NITROFURANTOIN (MACRODANTIN) 50 MG CAPSULE    Take 1 capsule (50 mg total) by mouth at bedtime. Start after ciprofloxacin doses are completed   OXYCODONE (OXY IR/ROXICODONE) 5 MG IMMEDIATE RELEASE TABLET    Take 1 tablet (5 mg total) by mouth every 6 (six) hours as needed for severe pain.   SERTRALINE (ZOLOFT) 100 MG TABLET    Take 1 tablet (100 mg total) by mouth at bedtime.  Modified Medications   No medications on file  Discontinued Medications   No medications on file    SIGNIFICANT DIAGNOSTIC EXAMS  11-14-13: chest x-ray: Cardiomegaly and pulmonary vascular congestion / interstitial edema which appears increased compared to 04/27/2013. No pneumonia identified.  11-14-13: 2-d echo: Left ventricle:  Paradoxical septal motion may be related both to pulmonary hypertension and pacing. EF difficult to aseesss. I think 40% range. The cavity size was normal. Wall thickness was normal. - Mitral valve: Mild regurgitation. - Left atrium: The atrium was moderately dilated. - Right ventricle: The pacing wire is probably seen. The cavity size was moderately dilated. Systolic function was mildly to moderately reduced. - Right atrium: The atrium was severely dilated. - Tricuspid valve: The valve appears to be grossly normal. Moderate-severe regurgitation.  11-15-13: chest x-ray: There is no evidence of a postprocedure complication following placement of the left internal jugular venous catheter. There are findings consistent with CHF with interstitial edema.  11-15-13: bilateral lower extremity doppler: No evidence of deep vein thrombosis involving the visualized veins of the right lower extremity.- No evidence of deep vein  thrombosis involving the visualized veins of the left lower extremity. - No evidence of Baker's cyst on the right or left.   11-17-13: chest x-ray: Cardiomegaly and resolved vascular congestion.    LABS REVIEWED:   11-14-13: wbc 3.9; hgb 5.9; hct 17.0; mcv 91.4; plt 50; glucose 286; bun 57; creat 2.50; k+5.3; na++135 BNP 1616 (repeat hgb 15.0; hct 44.0) 11-15-13:wbc 9.0; hgb 11.8; hct 331.; mcv 88.5 plt 111; glucose 106; bun 60; creat 2.49; k+5.2; na++141 Alk phos 133; albumin 2.2 11-20-13: wbc 3.6; hgb 10.4; hct 29.5; mcv 90.5; plt 62; gluocse 68; bun 26; creat 1.73; k+4.2; na++135  hgb a1c 6.6      Review of Systems  Constitutional: Negative for malaise/fatigue.  Eyes: Negative for blurred vision.  Respiratory: Negative for cough, shortness of breath and wheezing.   Cardiovascular: Positive for leg swelling. Negative for chest pain and palpitations.  Gastrointestinal: Positive for heartburn. Negative for constipation.  Musculoskeletal: Negative for joint pain and myalgias.  Skin: Negative.   Neurological: Negative for weakness.  Psychiatric/Behavioral: Negative for depression. The patient is not nervous/anxious.     Physical Exam  Constitutional: She is oriented to person, place, and time. No distress.  obese  Neck: Neck supple. No JVD present.  Cardiovascular:  Tachycardic heart rate 120-130. Heart irreg/irreg Possible intermittent diastolic murmur Pedal pulses faint  Radial pulse and apical do not match   Respiratory: Effort normal and breath sounds normal. No respiratory distress. She has no wheezes.  GI: Soft. Bowel sounds are normal. She exhibits no distension. There is no tenderness.  Musculoskeletal: Normal range of motion. She exhibits edema.  Has chronic lymphedema lower extremities.   Neurological: She is alert and oriented to person, place, and time.  Skin: Skin is warm and dry. She is not diaphoretic.  Lower extremities discolored from pvd and pad   Psychiatric:  She has a normal mood and affect.     ASSESSMENT/ PLAN:  1. afib with rvr; question tachycardia; with history of mi: will get an ekg today; will begin cardizem 30 mg every 8 hours and will monitor her status. She is due to see cardiology next week.   Time spent with patient 45 minutes

## 2013-12-13 ENCOUNTER — Ambulatory Visit (INDEPENDENT_AMBULATORY_CARE_PROVIDER_SITE_OTHER): Payer: Medicare Other | Admitting: Nurse Practitioner

## 2013-12-13 ENCOUNTER — Encounter: Payer: Self-pay | Admitting: Nurse Practitioner

## 2013-12-13 VITALS — BP 90/60 | HR 97 | Ht 68.0 in | Wt 258.0 lb

## 2013-12-13 DIAGNOSIS — I480 Paroxysmal atrial fibrillation: Secondary | ICD-10-CM

## 2013-12-13 DIAGNOSIS — I469 Cardiac arrest, cause unspecified: Secondary | ICD-10-CM

## 2013-12-13 DIAGNOSIS — I4891 Unspecified atrial fibrillation: Secondary | ICD-10-CM

## 2013-12-13 NOTE — Patient Instructions (Signed)
Continue with current medicines  See Dr. Radford Pax in about 6 weeks  Call the [place] office at [phone] if you have any questions, problems or concerns.

## 2013-12-13 NOTE — Progress Notes (Signed)
Mickel Fuchs Date of Birth: 19-Feb-1934 Medical Record #[id number]  History of Present Illness: Ms. Tara Huff is seen back today for a post hospital visit. Seen for Dr. Radford Pax. She is a 80 year old morbidly obese and chronic ill female with multiple issues. These include type 2 DM, OSA, pulmonary HTN, chronic systolic HF, AF, CKD and sacral decubitus along with her other issues as listed below. It is noted in her EPIC chart that she is not anticoagulated for her AF - noted that "several years ago she took herself off this medication as she had bleeding in her sclera. She does not remember if she went to the eye doctor or if she discussed with her pcp or cardiologist.".   Most recently presented with cardiac arrest with complete heart block in the setting of sepsis/acute renal failure/UTI. She was brought in by EMS with external pacing in progress. Had subsequent temporary pacer. Noted to be hypothermic with metabolic acidosis, hyperkalemia, acute renal failure and shock. She had previously been on both beta blocker and CCB for her AF. She was septic and her heart block was felt to be due to multiple metabolic derangements. Cardiology followed the patient and advised that no additional ischemic work up was indicated. Her heart rate did recover and her temporary pacer was removed. EF was lower at 40% by echo. Her urine did grow Ecoli. Most of her BP meds were not restarted (this include Cardizem, Vasotec, Lasix, aldactone and lopressor). She was discharged on prophylactic nitrofurantoin.   Comes back today. Here with her husband. She is in a wheelchair. Says she is getting stronger. Noted that she has had some fast heart rates and is now back on short acting CCB. Not dizzy or lightheaded. No chest pain. Chest still sore from CPR. Not short of breath. Working with rehab. Plan is for her to return home.   Current Outpatient Prescriptions  Medication Sig Dispense Refill  . acetaminophen (TYLENOL) 500 MG  tablet Take 1,000 mg by mouth 2 (two) times daily.      Marland Kitchen ALPRAZolam (XANAX) 0.25 MG tablet Take 1 tablet (0.25 mg total) by mouth at bedtime as needed for anxiety or sleep.  20 tablet  0  . aspirin EC 81 MG EC tablet Take 1 tablet (81 mg total) by mouth daily.  30 tablet  3  . Cholecalciferol (VITAMIN D) 1000 UNITS capsule Take 1,000 Units by mouth daily.        . collagenase (SANTYL) ointment Apply 1 application topically daily.      Marland Kitchen diltiazem (CARDIZEM) 30 MG tablet Take 30 mg by mouth 4 (four) times daily.      . insulin lispro (HUMALOG) 100 UNIT/ML injection Inject 5 Units into the skin 3 (three) times daily before meals. Give 5 units prior to meals for cbg >=150      . nitrofurantoin (MACRODANTIN) 50 MG capsule Take 1 capsule (50 mg total) by mouth at bedtime. Start after ciprofloxacin doses are completed      . nystatin (MYCOSTATIN/NYSTOP) 100000 UNIT/GM POWD Apply topically.      Marland Kitchen omeprazole (PRILOSEC) 20 MG capsule Take 20 mg by mouth daily.      Marland Kitchen oxyCODONE (OXY IR/ROXICODONE) 5 MG immediate release tablet Take 1 tablet (5 mg total) by mouth every 6 (six) hours as needed for severe pain.  20 tablet  0  . sertraline (ZOLOFT) 100 MG tablet Take 1 tablet (100 mg total) by mouth at bedtime.  30 tablet  0  .  triamcinolone ointment (KENALOG) 0.1 % Apply 1 application topically 2 (two) times daily.       No current facility-administered medications for this visit.    No Known Allergies  Past Medical History  Diagnosis Date  . Hypertension   . Atrial fibrillation   . MI (myocardial infarction)   . Anxiety   . Hypercholesterolemia   . Shoulder pain   . Sleep apnea     intolerant to CPAP  . Type 2 diabetes mellitus   . OSA (obstructive sleep apnea)   . Hypertensive heart disease   . Morbid obesity   . Pyogenic granuloma   . Cancer     cervical  . Depression   . Pneumonia   . CHF (congestive heart failure)     Past Surgical History  Procedure Laterality Date  . Knee  surgery      at age 59  . Cataract surgery      both eyes  . Abdominal hysterectomy    . Toe amputation      History  Smoking status  . Former Smoker -- 1.00 packs/day for 20 years  . Types: Cigarettes  . Quit date: 11/09/1992  Smokeless tobacco  . Never Used    History  Alcohol Use No    Family History  Problem Relation Age of Onset  . Emphysema Father   . Cancer Mother     intestinal    Review of Systems: The review of systems is per the HPI.  All other systems were reviewed and are negative.  Physical Exam: BP 90/60  Pulse 97  Ht 5\' 8"  (1.727 m)  Wt 258 lb (117.028 kg)  BMI 39.24 kg/m2 Patient looks chronically ill but in no acute distress. Morbidly obese. Skin is warm and dry. Color a little sallow.  HEENT is unremarkable. Normocephalic/atraumatic. PERRL. Sclera are nonicteric. Neck is supple. No masses. No JVD. Lungs are clear. Cardiac exam shows an irregular rhythm. Rate ok.  Abdomen is soft. Extremities are without significant edema. Gait is not tested.  No gross neurologic deficits noted.  LABORATORY DATA: EKG today shows atrial fib with a fairly well controlled VR of 87. Diffuse ST and T wave changes noted.   Lab Results  Component Value Date   WBC 4.7 11/22/2013   HGB 11.1* 11/22/2013   HCT 32.4* 11/22/2013   PLT 110* 11/22/2013   GLUCOSE 150* 11/22/2013   CHOL 87 03/20/2012   TRIG 47 03/20/2012   HDL 31* 03/20/2012   LDLCALC 47 03/20/2012   ALT 17 11/15/2013   AST 34 11/15/2013   NA 136* 11/22/2013   K 4.8 11/22/2013   CL 104 11/22/2013   CREATININE 1.35* 11/22/2013   BUN 19 11/22/2013   CO2 21 11/22/2013   TSH 1.942 04/27/2013   INR 1.34 11/14/2013   HGBA1C 6.6* 11/20/2013   Echo Study Conclusions from January 2015  - Left ventricle: Paradoxical septal motion may be related both to pulmonary hypertension and pacing. EF difficult to aseesss. I think 40% range. The cavity size was normal. Wall thickness was normal. - Mitral valve: Mild regurgitation. - Left  atrium: The atrium was moderately dilated. - Right ventricle: The pacing wire is probably seen. The cavity size was moderately dilated. Systolic function was mildly to moderately reduced. - Right atrium: The atrium was severely dilated. - Tricuspid valve: The valve appears to be grossly normal. Moderate-severe regurgitation. - Pulmonary arteries: PA peak pressure: 84mm Hg (S).    Assessment / Plan: 1.  S/P bradycardic cardiac arrest - felt to be due to multiple metabolich derangements with multi system organ failure/physical deconditioning - seems to be progressing with her rehab therapies. No change in current plan.   2. Atrial fib - rate looks ok - back on short acting CCB therapy. She has refused anticoagulation and had bleeding in her eye with coumadin noted in the past.   3. Systolic HF - BP too soft to restart her other medicines at this time.   4. UTI - now on chronic antibiotic therapy  5. Morbid obesity  Will see her back in about 6 weeks. Hope to add back medicines if BP rises. Plan for repeat echo in a couple of months. Looks compensated today.   Patient is agreeable to this plan and will call if any problems develop in the interim.   Burtis Junes, RN, [facility] [address] [place], [place]  [phone]

## 2013-12-15 ENCOUNTER — Non-Acute Institutional Stay (SKILLED_NURSING_FACILITY): Payer: Medicare Other | Admitting: Adult Health

## 2013-12-15 DIAGNOSIS — I4891 Unspecified atrial fibrillation: Secondary | ICD-10-CM

## 2013-12-15 DIAGNOSIS — I480 Paroxysmal atrial fibrillation: Secondary | ICD-10-CM

## 2013-12-15 DIAGNOSIS — I509 Heart failure, unspecified: Secondary | ICD-10-CM

## 2013-12-15 DIAGNOSIS — N183 Chronic kidney disease, stage 3 unspecified: Secondary | ICD-10-CM

## 2013-12-17 MED ORDER — FUROSEMIDE 20 MG PO TABS: 20.0000 mg | ORAL_TABLET | Freq: Every day | ORAL | Status: DC

## 2013-12-17 NOTE — Progress Notes (Signed)
Patient ID: Tara Huff, female   DOB: 1934/05/12, 80 y.o.   MRN: [id number]     [facility]  No Known Allergies   Chief Complaint  Patient presents with  . Acute Visit    weight gain and edema     HPI:  She has been gaining weight since her admission to this facility. Her edema in her lower extremities has gotten worse. Per the nursing staff her admission weight was 240 pounds and her present weight is 260 pounds. She has chronic edema. She is not complaining of shortness of breath or chest pain. Since starting on low dose cardiazem her heart rate has slowed down significantly.   Past Medical History  Diagnosis Date  . Hypertension   . Atrial fibrillation   . MI (myocardial infarction)   . Anxiety   . Hypercholesterolemia   . Shoulder pain   . Sleep apnea     intolerant to CPAP  . Type 2 diabetes mellitus   . OSA (obstructive sleep apnea)   . Hypertensive heart disease   . Morbid obesity   . Pyogenic granuloma   . Cancer     cervical  . Depression   . Pneumonia   . CHF (congestive heart failure)     Past Surgical History  Procedure Laterality Date  . Knee surgery      at age 50  . Cataract surgery      both eyes  . Abdominal hysterectomy    . Toe amputation      VITAL SIGNS BP 129/78  Pulse 70  Ht '5\' 6"'  (1.676 m)  Wt 260 lb (117.935 kg)  BMI 41.99 kg/m2   Patient's Medications  New Prescriptions   No medications on file  Previous Medications   ACETAMINOPHEN (TYLENOL) 500 MG TABLET    Take 1,000 mg by mouth 2 (two) times daily.   ALPRAZOLAM (XANAX) 0.25 MG TABLET    Take 1 tablet (0.25 mg total) by mouth at bedtime as needed for anxiety or sleep.   ASPIRIN EC 81 MG EC TABLET    Take 1 tablet (81 mg total) by mouth daily.   CHOLECALCIFEROL (VITAMIN D) 1000 UNITS CAPSULE    Take 1,000 Units by mouth daily.     COLLAGENASE (SANTYL) OINTMENT    Apply 1 application topically daily.   DILTIAZEM (CARDIZEM) 30 MG TABLET    Take 30 mg by mouth 4 (four)  times daily.   INSULIN LISPRO (HUMALOG) 100 UNIT/ML INJECTION    Inject 5 Units into the skin 3 (three) times daily before meals. Give 5 units prior to meals for cbg >=150   NITROFURANTOIN (MACRODANTIN) 50 MG CAPSULE    Take 1 capsule (50 mg total) by mouth at bedtime. Start after ciprofloxacin doses are completed   NYSTATIN (MYCOSTATIN/NYSTOP) 100000 UNIT/GM POWD    Apply topically.   OMEPRAZOLE (PRILOSEC) 20 MG CAPSULE    Take 20 mg by mouth daily.   OXYCODONE (OXY IR/ROXICODONE) 5 MG IMMEDIATE RELEASE TABLET    Take 1 tablet (5 mg total) by mouth every 6 (six) hours as needed for severe pain.   SERTRALINE (ZOLOFT) 100 MG TABLET    Take 1 tablet (100 mg total) by mouth at bedtime.   TRIAMCINOLONE OINTMENT (KENALOG) 0.1 %    Apply 1 application topically 2 (two) times daily.  Modified Medications   No medications on file  Discontinued Medications   No medications on file    SIGNIFICANT DIAGNOSTIC EXAMS  11-14-13:  chest x-ray: Cardiomegaly and pulmonary vascular congestion / interstitial edema which appears increased compared to 04/27/2013. No pneumonia identified.  11-14-13: 2-d echo: Left ventricle: Paradoxical septal motion may be related both to pulmonary hypertension and pacing. EF difficult to aseesss. I think 40% range. The cavity size was normal. Wall thickness was normal. - Mitral valve: Mild regurgitation. - Left atrium: The atrium was moderately dilated. - Right ventricle: The pacing wire is probably seen. The cavity size was moderately dilated. Systolic function was mildly to moderately reduced. - Right atrium: The atrium was severely dilated. - Tricuspid valve: The valve appears to be grossly normal. Moderate-severe regurgitation.  11-15-13: chest x-ray: There is no evidence of a postprocedure complication following placement of the left internal jugular venous catheter. There are findings consistent with CHF with interstitial edema.  11-15-13: bilateral lower extremity doppler: No  evidence of deep vein thrombosis involving the visualized veins of the right lower extremity.- No evidence of deep vein thrombosis involving the visualized veins of the left lower extremity. - No evidence of Baker's cyst on the right or left.   11-17-13: chest x-ray: Cardiomegaly and resolved vascular congestion.  11-30-13: chest x-ray; cardiomegaly without evidence of acute cardiopulmonary disease   12-11-13; ekg: afib     LABS REVIEWED:   11-14-13: wbc 3.9; hgb 5.9; hct 17.0; mcv 91.4; plt 50; glucose 286; bun 57; creat 2.50; k+5.3; na++135 BNP 1616 (repeat hgb 15.0; hct 44.0) 11-15-13:wbc 9.0; hgb 11.8; hct 331.; mcv 88.5 plt 111; glucose 106; bun 60; creat 2.49; k+5.2; na++141 Alk phos 133; albumin 2.2 11-20-13: wbc 3.6; hgb 10.4; hct 29.5; mcv 90.5; plt 62; gluocse 68; bun 26; creat 1.73; k+4.2; na++135  hgb a1c 6.6   12-01-13: wbc 4.5; hgb 9.9; hct 32.1; mcv 96.1.;plt 343; glucose 119; bun 19; creat 1.0; k+5.4;na++ 133 liver normal albumin 2.5     Review of Systems  Constitutional: Negative for malaise/fatigue.  Eyes: Negative for blurred vision.  Respiratory: Negative for cough, shortness of breath and wheezing.   Cardiovascular: Positive for leg swelling. Negative for chest pain and palpitations.  Gastrointestinal: negative for heartburn  Negative for constipation.  Musculoskeletal: Negative for joint pain and myalgias.  Skin: Negative.   Neurological: Negative for weakness.  Psychiatric/Behavioral: Negative for depression. The patient is not nervous/anxious.     Physical Exam  Constitutional: She is oriented to person, place, and time. No distress.  obese  Neck: Neck supple. No JVD present.  Cardiovascular:  Heart irreg/irreg Possible intermittent diastolic murmur Pedal pulses faint  Radial pulse and apical do not match   Respiratory: Effort normal and breath sounds normal. No respiratory distress. She has no wheezes.  GI: Soft. Bowel sounds are normal. She exhibits no  distension. There is no tenderness.  Musculoskeletal: Normal range of motion. She exhibits edema.  Has chronic lymphedema lower extremities.   Neurological: She is alert and oriented to person, place, and time.  Skin: Skin is warm and dry. She is not diaphoretic.  Lower extremities discolored from pvd and pad   Psychiatric: She has a normal mood and affect.      ASSESSMENT/ PLAN:   1. CHF: will begin her on low dose lasix 20 mg daily due to her poor renal function; will being daily weight to call if she gains >3 pounds in on e day or 5 pounds in one week .will check on Monday: bmp; bnp; and chest x-ray; will continue to monitor her status.

## 2013-12-18 ENCOUNTER — Non-Acute Institutional Stay (SKILLED_NURSING_FACILITY): Payer: Medicare Other | Admitting: Adult Health

## 2013-12-18 ENCOUNTER — Encounter: Payer: Self-pay | Admitting: Adult Health

## 2013-12-18 DIAGNOSIS — I509 Heart failure, unspecified: Secondary | ICD-10-CM

## 2013-12-18 MED ORDER — FUROSEMIDE 20 MG PO TABS: 40.0000 mg | ORAL_TABLET | Freq: Every day | ORAL | Status: DC

## 2013-12-18 NOTE — Progress Notes (Signed)
Patient ID: Tara Huff, female   DOB: 02-28-1934, 80 y.o.   MRN: [id number]     [facility]  No Known Allergies   Chief Complaint  Patient presents with  . Acute Visit    patient concerns     HPI:  She has gained weight. Was placed on lasix 20 mg daily her weight has gone from 276 pounds on 12-16-13 to 274 pounds today. She states she feels as though she has gained a lot weight lately and feels bloated due to the weight gain. She is not having shortness of breath or chest pain present. We discussed at great length the importance of her weighing on a daily basis and purpose behind the weights.   Past Medical History  Diagnosis Date  . Hypertension   . Atrial fibrillation   . MI (myocardial infarction)   . Anxiety   . Hypercholesterolemia   . Shoulder pain   . Sleep apnea     intolerant to CPAP  . Type 2 diabetes mellitus   . OSA (obstructive sleep apnea)   . Hypertensive heart disease   . Morbid obesity   . Pyogenic granuloma   . Cancer     cervical  . Depression   . Pneumonia   . CHF (congestive heart failure)     Past Surgical History  Procedure Laterality Date  . Knee surgery      at age 60  . Cataract surgery      both eyes  . Abdominal hysterectomy    . Toe amputation      VITAL SIGNS BP 127/66  Pulse 88  Ht '5\' 8"'  (1.727 m)  Wt 274 lb (124.286 kg)  BMI 41.67 kg/m2   Patient's Medications  New Prescriptions   No medications on file  Previous Medications   ACETAMINOPHEN (TYLENOL) 500 MG TABLET    Take 1,000 mg by mouth 2 (two) times daily.   ALPRAZOLAM (XANAX) 0.25 MG TABLET    Take 1 tablet (0.25 mg total) by mouth at bedtime as needed for anxiety or sleep.   ASPIRIN EC 81 MG EC TABLET    Take 1 tablet (81 mg total) by mouth daily.   CHOLECALCIFEROL (VITAMIN D) 1000 UNITS CAPSULE    Take 1,000 Units by mouth daily.     DILTIAZEM (CARDIZEM) 30 MG TABLET    Take 30 mg by mouth 3 (three) times daily.    FUROSEMIDE (LASIX) 20 MG TABLET    Take 1  tablet (20 mg total) by mouth daily.   INSULIN LISPRO (HUMALOG) 100 UNIT/ML INJECTION    Inject 5 Units into the skin 3 (three) times daily before meals. Give 5 units prior to meals for cbg >=150   NITROFURANTOIN (MACRODANTIN) 50 MG CAPSULE    Take 1 capsule (50 mg total) by mouth at bedtime. Start after ciprofloxacin doses are completed   OMEPRAZOLE (PRILOSEC) 20 MG CAPSULE    Take 20 mg by mouth daily.   OXYCODONE (OXY IR/ROXICODONE) 5 MG IMMEDIATE RELEASE TABLET    Take 1 tablet (5 mg total) by mouth every 6 (six) hours as needed for severe pain.   SERTRALINE (ZOLOFT) 100 MG TABLET    Take 1 tablet (100 mg total) by mouth at bedtime.   TRIAMCINOLONE OINTMENT (KENALOG) 0.1 %    Apply 1 application topically 2 (two) times daily.  Modified Medications   No medications on file  Discontinued Medications   COLLAGENASE (SANTYL) OINTMENT    Apply 1 application  topically daily.   NYSTATIN (MYCOSTATIN/NYSTOP) 100000 UNIT/GM POWD    Apply topically.    SIGNIFICANT DIAGNOSTIC EXAMS  11-14-13: chest x-ray: Cardiomegaly and pulmonary vascular congestion / interstitial edema which appears increased compared to 04/27/2013. No pneumonia identified.  11-14-13: 2-d echo: Left ventricle: Paradoxical septal motion may be related both to pulmonary hypertension and pacing. EF difficult to aseesss. I think 40% range. The cavity size was normal. Wall thickness was normal. - Mitral valve: Mild regurgitation. - Left atrium: The atrium was moderately dilated. - Right ventricle: The pacing wire is probably seen. The cavity size was moderately dilated. Systolic function was mildly to moderately reduced. - Right atrium: The atrium was severely dilated. - Tricuspid valve: The valve appears to be grossly normal. Moderate-severe regurgitation.  11-15-13: chest x-ray: There is no evidence of a postprocedure complication following placement of the left internal jugular venous catheter. There are findings consistent with CHF with  interstitial edema.  11-15-13: bilateral lower extremity doppler: No evidence of deep vein thrombosis involving the visualized veins of the right lower extremity.- No evidence of deep vein thrombosis involving the visualized veins of the left lower extremity. - No evidence of Baker's cyst on the right or left.   11-17-13: chest x-ray: Cardiomegaly and resolved vascular congestion.  11-30-13: chest x-ray; cardiomegaly without evidence of acute cardiopulmonary disease   12-11-13; ekg: afib     LABS REVIEWED:   11-14-13: wbc 3.9; hgb 5.9; hct 17.0; mcv 91.4; plt 50; glucose 286; bun 57; creat 2.50; k+5.3; na++135 BNP 1616 (repeat hgb 15.0; hct 44.0) 11-15-13:wbc 9.0; hgb 11.8; hct 331.; mcv 88.5 plt 111; glucose 106; bun 60; creat 2.49; k+5.2; na++141 Alk phos 133; albumin 2.2 11-20-13: wbc 3.6; hgb 10.4; hct 29.5; mcv 90.5; plt 62; gluocse 68; bun 26; creat 1.73; k+4.2; na++135  hgb a1c 6.6   12-01-13: wbc 4.5; hgb 9.9; hct 32.1; mcv 96.1.;plt 343; glucose 119; bun 19; creat 1.0; k+5.4;na++ 133 liver normal albumin 2.5     Review of Systems  Constitutional: Negative for malaise/fatigue.  Eyes: Negative for blurred vision.  Respiratory: Negative for cough, shortness of breath and wheezing.   Cardiovascular: Positive for leg swelling. Negative for chest pain and palpitations.  Gastrointestinal: negative for heartburn  Negative for constipation.  Musculoskeletal: Negative for joint pain and myalgias.  Skin: Negative.   Neurological: Negative for weakness.  Psychiatric/Behavioral: Negative for depression. The patient is not nervous/anxious.     Physical Exam  Constitutional: She is oriented to person, place, and time. No distress.  obese  Neck: Neck supple. No JVD present.  Cardiovascular:  Heart irreg/irreg Possible intermittent diastolic murmur Pedal pulses faint  Respiratory: Effort normal and breath sounds normal. No respiratory distress. She has no wheezes.  GI: Soft. Bowel sounds are  normal.There is no tenderness. has some abdominal swelling present.  Musculoskeletal: Normal range of motion. She exhibits edema.  Has chronic lymphedema lower extremities.   Neurological: She is alert and oriented to person, place, and time.  Skin: Skin is warm and dry. She is not diaphoretic.  Lower extremities discolored from pvd and pad   Psychiatric: She has a normal mood and affect.     ASSESSMENT/ PLAN:  1. Chf: will increase her lasix to 40 mg daily will continue her daily weights; her lab work and chest x-ray results are pending will continue to monitor her status.

## 2013-12-20 ENCOUNTER — Other Ambulatory Visit: Payer: Self-pay | Admitting: *Deleted

## 2013-12-20 MED ORDER — ALPRAZOLAM 0.25 MG PO TABS: ORAL_TABLET | ORAL | Status: DC

## 2013-12-22 ENCOUNTER — Non-Acute Institutional Stay (SKILLED_NURSING_FACILITY): Payer: Medicare Other | Admitting: Adult Health

## 2013-12-22 DIAGNOSIS — N183 Chronic kidney disease, stage 3 unspecified: Secondary | ICD-10-CM

## 2013-12-22 DIAGNOSIS — I509 Heart failure, unspecified: Secondary | ICD-10-CM

## 2013-12-25 ENCOUNTER — Encounter: Payer: Self-pay | Admitting: Adult Health

## 2013-12-25 ENCOUNTER — Non-Acute Institutional Stay (SKILLED_NURSING_FACILITY): Payer: Medicare Other | Admitting: Adult Health

## 2013-12-25 DIAGNOSIS — N183 Chronic kidney disease, stage 3 unspecified: Secondary | ICD-10-CM

## 2013-12-25 DIAGNOSIS — I509 Heart failure, unspecified: Secondary | ICD-10-CM

## 2013-12-25 MED ORDER — METOLAZONE 5 MG PO TABS: 5.0000 mg | ORAL_TABLET | Freq: Every day | ORAL | Status: DC

## 2013-12-25 MED ORDER — FUROSEMIDE 20 MG PO TABS: 40.0000 mg | ORAL_TABLET | Freq: Two times a day (BID) | ORAL | Status: DC

## 2013-12-25 NOTE — Progress Notes (Signed)
Patient ID: Tara Huff, female   DOB: 26-Jan-1934, 80 y.o.   MRN: [id number]     [facility]  No Known Allergies   Chief Complaint  Patient presents with  . Acute Visit    patient follow up     HPI:  She has continued to gain weight. Her current weight is 279 pounds her weight on 12-20-13 was 276 pounds. She has been treated for pneumonia. She states that her breathing is easier. She states that she continues to feel somewhat bloated.    Past Medical History  Diagnosis Date  . Hypertension   . Atrial fibrillation   . MI (myocardial infarction)   . Anxiety   . Hypercholesterolemia   . Shoulder pain   . Sleep apnea     intolerant to CPAP  . Type 2 diabetes mellitus   . OSA (obstructive sleep apnea)   . Hypertensive heart disease   . Morbid obesity   . Pyogenic granuloma   . Cancer     cervical  . Depression   . Pneumonia   . CHF (congestive heart failure)     Past Surgical History  Procedure Laterality Date  . Knee surgery      at age 72  . Cataract surgery      both eyes  . Abdominal hysterectomy    . Toe amputation      VITAL SIGNS BP 116/57  Pulse 70  Ht $R'5\' 8"'hB$  (1.727 m)  Wt 279 lb (126.554 kg)  BMI 42.43 kg/m2   Patient's Medications  New Prescriptions   No medications on file  Previous Medications   ACETAMINOPHEN (TYLENOL) 500 MG TABLET    Take 1,000 mg by mouth 2 (two) times daily.   ALPRAZOLAM (XANAX) 0.25 MG TABLET    Take one tablet by mouth every 12 hours as needed for anxiety   ASPIRIN EC 81 MG EC TABLET    Take 1 tablet (81 mg total) by mouth daily.   CHOLECALCIFEROL (VITAMIN D) 1000 UNITS CAPSULE    Take 1,000 Units by mouth daily.     DILTIAZEM (CARDIZEM) 30 MG TABLET    Take 30 mg by mouth 3 (three) times daily.    FUROSEMIDE (LASIX) 20 MG TABLET    Take 2 tablets (40 mg total) by mouth daily.   INSULIN LISPRO (HUMALOG) 100 UNIT/ML INJECTION    Inject 5 Units into the skin 3 (three) times daily before meals. Give 5 units prior to  meals for cbg >=150   NITROFURANTOIN (MACRODANTIN) 50 MG CAPSULE    Take 1 capsule (50 mg total) by mouth at bedtime. Start after ciprofloxacin doses are completed   OMEPRAZOLE (PRILOSEC) 20 MG CAPSULE    Take 20 mg by mouth daily.   OXYCODONE (OXY IR/ROXICODONE) 5 MG IMMEDIATE RELEASE TABLET    Take 1 tablet (5 mg total) by mouth every 6 (six) hours as needed for severe pain.   SERTRALINE (ZOLOFT) 100 MG TABLET    Take 1 tablet (100 mg total) by mouth at bedtime.   TRIAMCINOLONE OINTMENT (KENALOG) 0.1 %    Apply 1 application topically 2 (two) times daily.  Modified Medications   No medications on file  Discontinued Medications   No medications on file    SIGNIFICANT DIAGNOSTIC EXAMS   11-14-13: chest x-ray: Cardiomegaly and pulmonary vascular congestion / interstitial edema which appears increased compared to 04/27/2013. No pneumonia identified.  11-14-13: 2-d echo: Left ventricle: Paradoxical septal motion may be related both  to pulmonary hypertension and pacing. EF difficult to aseesss. I think 40% range. The cavity size was normal. Wall thickness was normal. - Mitral valve: Mild regurgitation. - Left atrium: The atrium was moderately dilated. - Right ventricle: The pacing wire is probably seen. The cavity size was moderately dilated. Systolic function was mildly to moderately reduced. - Right atrium: The atrium was severely dilated. - Tricuspid valve: The valve appears to be grossly normal. Moderate-severe regurgitation.  11-15-13: chest x-ray: There is no evidence of a postprocedure complication following placement of the left internal jugular venous catheter. There are findings consistent with CHF with interstitial edema.  11-15-13: bilateral lower extremity doppler: No evidence of deep vein thrombosis involving the visualized veins of the right lower extremity.- No evidence of deep vein thrombosis involving the visualized veins of the left lower extremity. - No evidence of Baker's cyst on the  right or left.   11-17-13: chest x-ray: Cardiomegaly and resolved vascular congestion.  11-30-13: chest x-ray; cardiomegaly without evidence of acute cardiopulmonary disease   12-11-13; ekg: afib     LABS REVIEWED:   11-14-13: wbc 3.9; hgb 5.9; hct 17.0; mcv 91.4; plt 50; glucose 286; bun 57; creat 2.50; k+5.3; na++135 BNP 1616 (repeat hgb 15.0; hct 44.0) 11-15-13:wbc 9.0; hgb 11.8; hct 331.; mcv 88.5 plt 111; glucose 106; bun 60; creat 2.49; k+5.2; na++141 Alk phos 133; albumin 2.2 11-20-13: wbc 3.6; hgb 10.4; hct 29.5; mcv 90.5; plt 62; gluocse 68; bun 26; creat 1.73; k+4.2; na++135  hgb a1c 6.6   12-01-13: wbc 4.5; hgb 9.9; hct 32.1; mcv 96.1.;plt 343; glucose 119; bun 19; creat 1.0; k+5.4;na++ 133 liver normal albumin 2.5  12-19-13: glucose 144; bun 22; creat 1.1; k+ 5.0; na++ 137; BNP 398     Review of Systems  Constitutional: Negative for malaise/fatigue.  Eyes: Negative for blurred vision.  Respiratory: Negative for cough, shortness of breath and wheezing.   Cardiovascular: Positive for leg swelling. Negative for chest pain and palpitations.  Gastrointestinal: negative for heartburn  Negative for constipation.  Musculoskeletal: Negative for joint pain and myalgias.  Skin: Negative.   Neurological: Negative for weakness.  Psychiatric/Behavioral: Negative for depression. The patient is not nervous/anxious.     Physical Exam  Constitutional: She is oriented to person, place, and time. No distress.  obese  Neck: Neck supple. No JVD present.  Cardiovascular:  Heart irreg/irreg Possible intermittent diastolic murmur Pedal pulses faint  Respiratory: Effort normal and breath sounds normal. No respiratory distress. She has no wheezes.  GI: Soft. Bowel sounds are normal.There is no tenderness. has some abdominal swelling present.  Musculoskeletal: Normal range of motion. She exhibits edema.  Has chronic lymphedema lower extremities.   Neurological: She is alert and oriented to  person, place, and time.  Skin: Skin is warm and dry. She is not diaphoretic.  Lower extremities discolored from pvd and pad   Psychiatric: She has a normal mood and affect.     ASSESSMENT/ PLAN:  1. chf  2. Ckd: stage III  Will begin zaroxolyn 5 mg daily prior to her am dose of lasix will increase her lasix to 40 mg twice daily and will monitor her status; will continue her daily weights and will check bmp in one week.      Ok Edwards NP [facility]  Contact [phone] Monday through Friday 8am- 5pm  After hours call [phone]

## 2013-12-25 NOTE — Progress Notes (Signed)
Patient ID: Tara Huff, female   DOB: [birth year], 80 y.o.   MRN: [id number]     [facility]  No Known Allergies   Chief Complaint  Patient presents with  . Acute Visit    follow up on status     HPI:  Her weight gain has slowed down; her weight is 282.4 pound and on 12-23-13 her weight was 281 pounds. She is being treated for pneumonia. She states that she is starting to feel better. She was started on zaroxolyn and her lasix was increased to twice daily. She is due for bmp. Her blood pressure is low today at 98/66. This will need to be monitored more closely.    Past Medical History  Diagnosis Date  . Hypertension   . Atrial fibrillation   . MI (myocardial infarction)   . Anxiety   . Hypercholesterolemia   . Shoulder pain   . Sleep apnea     intolerant to CPAP  . Type 2 diabetes mellitus   . OSA (obstructive sleep apnea)   . Hypertensive heart disease   . Morbid obesity   . Pyogenic granuloma   . Cancer     cervical  . Depression   . Pneumonia   . CHF (congestive heart failure)     Past Surgical History  Procedure Laterality Date  . Knee surgery      at age 74  . Cataract surgery      both eyes  . Abdominal hysterectomy    . Toe amputation      VITAL SIGNS BP 98/66  Pulse 80  Ht _0  (1.727 m)  Wt 282 lb 6.4 oz (128.096 kg)  BMI 42.95 kg/m2   Patient's Medications  New Prescriptions   No medications on file  Previous Medications   ACETAMINOPHEN (TYLENOL) 500 MG TABLET    Take 1,000 mg by mouth 2 (two) times daily.   ALPRAZOLAM (XANAX) 0.25 MG TABLET    Take one tablet by mouth every 12 hours as needed for anxiety   ASPIRIN EC 81 MG EC TABLET    Take 1 tablet (81 mg total) by mouth daily.   CHOLECALCIFEROL (VITAMIN D) 1000 UNITS CAPSULE    Take 1,000 Units by mouth daily.     DILTIAZEM (CARDIZEM) 30 MG TABLET    Take 30 mg by mouth 3 (three) times daily.    FUROSEMIDE (LASIX) 20 MG TABLET    Take 2 tablets (40 mg total) by mouth 2 (two) times  daily.   INSULIN LISPRO (HUMALOG) 100 UNIT/ML INJECTION    Inject 5 Units into the skin 3 (three) times daily before meals. Give 5 units prior to meals for cbg >=150   METOLAZONE (ZAROXOLYN) 5 MG TABLET    Take 1 tablet (5 mg total) by mouth daily.   NITROFURANTOIN (MACRODANTIN) 50 MG CAPSULE    Take 1 capsule (50 mg total) by mouth at bedtime. Start after ciprofloxacin doses are completed   OMEPRAZOLE (PRILOSEC) 20 MG CAPSULE    Take 20 mg by mouth daily.   OXYCODONE (OXY IR/ROXICODONE) 5 MG IMMEDIATE RELEASE TABLET    Take 1 tablet (5 mg total) by mouth every 6 (six) hours as needed for severe pain.   SERTRALINE (ZOLOFT) 100 MG TABLET    Take 1 tablet (100 mg total) by mouth at bedtime.   TRIAMCINOLONE OINTMENT (KENALOG) 0.1 %    Apply 1 application topically 2 (two) times daily.  Modified Medications   No medications  on file  Discontinued Medications   No medications on file    SIGNIFICANT DIAGNOSTIC EXAMS  11-14-13: chest x-ray: Cardiomegaly and pulmonary vascular congestion / interstitial edema which appears increased compared to 04/27/2013. No pneumonia identified.  11-14-13: 2-d echo: Left ventricle: Paradoxical septal motion may be related both to pulmonary hypertension and pacing. EF difficult to aseesss. I think 40% range. The cavity size was normal. Wall thickness was normal. - Mitral valve: Mild regurgitation. - Left atrium: The atrium was moderately dilated. - Right ventricle: The pacing wire is probably seen. The cavity size was moderately dilated. Systolic function was mildly to moderately reduced. - Right atrium: The atrium was severely dilated. - Tricuspid valve: The valve appears to be grossly normal. Moderate-severe regurgitation.  11-15-13: chest x-ray: There is no evidence of a postprocedure complication following placement of the left internal jugular venous catheter. There are findings consistent with CHF with interstitial edema.  11-15-13: bilateral lower extremity doppler:  No evidence of deep vein thrombosis involving the visualized veins of the right lower extremity.- No evidence of deep vein thrombosis involving the visualized veins of the left lower extremity. - No evidence of Baker's cyst on the right or left.   11-17-13: chest x-ray: Cardiomegaly and resolved vascular congestion.  11-30-13: chest x-ray; cardiomegaly without evidence of acute cardiopulmonary disease   12-11-13; ekg: afib     LABS REVIEWED:   11-14-13: wbc 3.9; hgb 5.9; hct 17.0; mcv 91.4; plt 50; glucose 286; bun 57; creat 2.50; k+5.3; na++135 BNP 1616 (repeat hgb 15.0; hct 44.0) 11-15-13:wbc 9.0; hgb 11.8; hct 331.; mcv 88.5 plt 111; glucose 106; bun 60; creat 2.49; k+5.2; na++141 Alk phos 133; albumin 2.2 11-20-13: wbc 3.6; hgb 10.4; hct 29.5; mcv 90.5; plt 62; gluocse 68; bun 26; creat 1.73; k+4.2; na++135  hgb a1c 6.6   12-01-13: wbc 4.5; hgb 9.9; hct 32.1; mcv 96.1.;plt 343; glucose 119; bun 19; creat 1.0; k+5.4;na++ 133 liver normal albumin 2.5  12-19-13: glucose 144; bun 22; creat 1.1; k+ 5.0; na++ 137; BNP 398     Review of Systems  Constitutional: Negative for malaise/fatigue.  Eyes: Negative for blurred vision.  Respiratory: Negative for cough, shortness of breath and wheezing.   Cardiovascular: Positive for leg swelling. Negative for chest pain and palpitations.  Gastrointestinal: negative for heartburn  Negative for constipation.  Musculoskeletal: Negative for joint pain and myalgias.  Skin: Negative.   Neurological: Negative for weakness.  Psychiatric/Behavioral: Negative for depression. The patient is not nervous/anxious.     Physical Exam  Constitutional: She is oriented to person, place, and time. No distress.  obese  Neck: Neck supple. No JVD present.  Cardiovascular:  Heart irreg/irreg Possible intermittent diastolic murmur Pedal pulses faint  Respiratory: Effort normal and breath sounds normal. No respiratory distress. She has no wheezes.  GI: Soft. Bowel sounds  are normal.There is no tenderness. has some abdominal swelling present.  Musculoskeletal: Normal range of motion. She exhibits edema.  Has chronic lymphedema lower extremities.   Neurological: She is alert and oriented to person, place, and time.  Skin: Skin is warm and dry. She is not diaphoretic.  Lower extremities discolored from pvd and pad   Psychiatric: She has a normal mood and affect.      ASSESSMENT/ PLAN:  CKD stage III; chf; will have nursing check her blood pressure manually with apical pulse every shift and will continue to monitor her status will not make further changes at this time.  Ok Edwards NP [facility]  Contact [phone] Monday through Friday 8am- 5pm  After hours call [phone]

## 2013-12-28 ENCOUNTER — Encounter: Payer: Self-pay | Admitting: *Deleted

## 2013-12-29 ENCOUNTER — Encounter: Payer: Self-pay | Admitting: *Deleted

## 2013-12-29 ENCOUNTER — Non-Acute Institutional Stay (SKILLED_NURSING_FACILITY): Payer: Medicare Other | Admitting: Adult Health

## 2013-12-29 DIAGNOSIS — E1159 Type 2 diabetes mellitus with other circulatory complications: Secondary | ICD-10-CM

## 2013-12-29 DIAGNOSIS — I4891 Unspecified atrial fibrillation: Secondary | ICD-10-CM

## 2013-12-29 DIAGNOSIS — N390 Urinary tract infection, site not specified: Secondary | ICD-10-CM

## 2013-12-29 DIAGNOSIS — I480 Paroxysmal atrial fibrillation: Secondary | ICD-10-CM

## 2013-12-29 DIAGNOSIS — F411 Generalized anxiety disorder: Secondary | ICD-10-CM

## 2013-12-29 DIAGNOSIS — I509 Heart failure, unspecified: Secondary | ICD-10-CM

## 2013-12-29 DIAGNOSIS — G8929 Other chronic pain: Secondary | ICD-10-CM

## 2013-12-29 DIAGNOSIS — I119 Hypertensive heart disease without heart failure: Secondary | ICD-10-CM

## 2013-12-31 ENCOUNTER — Encounter: Payer: Self-pay | Admitting: Adult Health

## 2013-12-31 DIAGNOSIS — N390 Urinary tract infection, site not specified: Secondary | ICD-10-CM | POA: Insufficient documentation

## 2013-12-31 DIAGNOSIS — G8929 Other chronic pain: Secondary | ICD-10-CM | POA: Insufficient documentation

## 2013-12-31 NOTE — Progress Notes (Signed)
Patient ID: Tara Huff, female   DOB: 1934-11-02, 80 y.o.   MRN: [id number]     [facility]  No Known Allergies   Chief Complaint  Patient presents with  . Medical Managment of Chronic Issues    HPI:  She is being seen for the management of her chronic illnesses. she has been declining daily weights. We have discussed the importance of her continuing the daily weights; she does verbalize understanding and will continue to weight daily. She has completed her zaroxolyn therapy. She has lost weight her latest weight is 274.4 pounds which is down about 13 pounds. She states that she is feeling better. There are no concerns being voiced at this time by the nursing staff.    Past Medical History  Diagnosis Date  . Hypertension   . Atrial fibrillation   . MI (myocardial infarction)   . Anxiety   . Hypercholesterolemia   . Shoulder pain   . Sleep apnea     intolerant to CPAP  . Type 2 diabetes mellitus   . OSA (obstructive sleep apnea)   . Hypertensive heart disease   . Morbid obesity   . Pyogenic granuloma   . Cancer     cervical  . Depression   . Pneumonia   . CHF (congestive heart failure)   . Cardiac arrest 11/14/2013  . Complete heart block 11/14/2013  . OSTEOMYELITIS 04/28/2010    L great Toe      . Carcinoma of endometrium 11/20/2013    Past Surgical History  Procedure Laterality Date  . Knee surgery      at age 68  . Cataract surgery      both eyes  . Abdominal hysterectomy    . Toe amputation      VITAL SIGNS BP 138/78  Pulse 86  Ht _0  (1.727 m)  Wt 274 lb 6.4 oz (124.467 kg)  BMI 41.73 kg/m2   Patient's Medications  New Prescriptions   No medications on file  Previous Medications   ACETAMINOPHEN (TYLENOL) 500 MG TABLET    Take 1,000 mg by mouth 2 (two) times daily.   ALPRAZOLAM (XANAX) 0.25 MG TABLET    Take one tablet by mouth every 12 hours as needed for anxiety   ASPIRIN EC 81 MG EC TABLET    Take 1 tablet (81 mg total) by mouth daily.   CHOLECALCIFEROL (VITAMIN D) 1000 UNITS CAPSULE    Take 1,000 Units by mouth daily.     DILTIAZEM (CARDIZEM) 30 MG TABLET    Take 30 mg by mouth 3 (three) times daily.    FUROSEMIDE (LASIX) 20 MG TABLET    Take 2 tablets (40 mg total) by mouth 2 (two) times daily.   INSULIN LISPRO (HUMALOG) 100 UNIT/ML INJECTION    Inject 5 Units into the skin 3 (three) times daily before meals. Give 5 units prior to meals for cbg >=150   NITROFURANTOIN (MACRODANTIN) 50 MG CAPSULE    Take 1 capsule (50 mg total) by mouth at bedtime. Start after ciprofloxacin doses are completed   OMEPRAZOLE (PRILOSEC) 20 MG CAPSULE    Take 20 mg by mouth daily.   OXYCODONE (OXY IR/ROXICODONE) 5 MG IMMEDIATE RELEASE TABLET    Take 1 tablet (5 mg total) by mouth every 6 (six) hours as needed for severe pain.   SERTRALINE (ZOLOFT) 100 MG TABLET    Take 1 tablet (100 mg total) by mouth at bedtime.   TRIAMCINOLONE OINTMENT (KENALOG) 0.1 %  Apply 1 application topically 2 (two) times daily.  Modified Medications   No medications on file  Discontinued Medications   METOLAZONE (ZAROXOLYN) 5 MG TABLET    Take 1 tablet (5 mg total) by mouth daily.    SIGNIFICANT DIAGNOSTIC EXAMS  11-14-13: chest x-ray: Cardiomegaly and pulmonary vascular congestion / interstitial edema which appears increased compared to 04/27/2013. No pneumonia identified.  11-14-13: 2-d echo: Left ventricle: Paradoxical septal motion may be related both to pulmonary hypertension and pacing. EF difficult to aseesss. I think 40% range. The cavity size was normal. Wall thickness was normal. - Mitral valve: Mild regurgitation. - Left atrium: The atrium was moderately dilated. - Right ventricle: The pacing wire is probably seen. The cavity size was moderately dilated. Systolic function was mildly to moderately reduced. - Right atrium: The atrium was severely dilated. - Tricuspid valve: The valve appears to be grossly normal. Moderate-severe regurgitation.  11-15-13: chest  x-ray: There is no evidence of a postprocedure complication following placement of the left internal jugular venous catheter. There are findings consistent with CHF with interstitial edema.  11-15-13: bilateral lower extremity doppler: No evidence of deep vein thrombosis involving the visualized veins of the right lower extremity.- No evidence of deep vein thrombosis involving the visualized veins of the left lower extremity. - No evidence of Baker's cyst on the right or left.   11-17-13: chest x-ray: Cardiomegaly and resolved vascular congestion.  11-30-13: chest x-ray; cardiomegaly without evidence of acute cardiopulmonary disease   12-11-13; ekg: afib     LABS REVIEWED:   11-14-13: wbc 3.9; hgb 5.9; hct 17.0; mcv 91.4; plt 50; glucose 286; bun 57; creat 2.50; k+5.3; na++135 BNP 1616 (repeat hgb 15.0; hct 44.0) 11-15-13:wbc 9.0; hgb 11.8; hct 331.; mcv 88.5 plt 111; glucose 106; bun 60; creat 2.49; k+5.2; na++141 Alk phos 133; albumin 2.2 11-20-13: wbc 3.6; hgb 10.4; hct 29.5; mcv 90.5; plt 62; gluocse 68; bun 26; creat 1.73; k+4.2; na++135  hgb a1c 6.6   12-01-13: wbc 4.5; hgb 9.9; hct 32.1; mcv 96.1.;plt 343; glucose 119; bun 19; creat 1.0; k+5.4;na++ 133 liver normal albumin 2.5  12-19-13: glucose 144; bun 22; creat 1.1; k+ 5.0; na++ 137; BNP 398     Review of Systems  Constitutional: Negative for malaise/fatigue.  Eyes: Negative for blurred vision.  Respiratory: Negative for cough, shortness of breath and wheezing.   Cardiovascular: Positive for leg swelling. Negative for chest pain and palpitations.  Gastrointestinal: negative for heartburn  Negative for constipation.  Musculoskeletal: Negative for joint pain and myalgias.  Skin: Negative.   Neurological: Negative for weakness.  Psychiatric/Behavioral: Negative for depression. The patient is not nervous/anxious.     Physical Exam  Constitutional: She is oriented to person, place, and time. No distress.  obese  Neck: Neck supple. No  JVD present.  Cardiovascular:  Heart irreg/irreg Possible intermittent diastolic murmur Pedal pulses faint  Respiratory: Effort normal and breath sounds normal. No respiratory distress. She has no wheezes.  GI: Soft. Bowel sounds are normal.There is no tenderness. .  Musculoskeletal: Normal range of motion. She exhibits edema.  Has chronic lymphedema lower extremities.   Neurological: She is alert and oriented to person, place, and time.  Skin: Skin is warm and dry. She is not diaphoretic.  Lower extremities discolored from pvd and pad   Psychiatric: She has a normal mood and affect.      ASSESSMENT/ PLAN:  1. CHF: her status is stable will continue lasix 40 mg twice daily and  will continue to monitor her status.   2. Benign hypertensive heart disease: she is presently stable; will continue her asa 81 mg daily; and will monitor her status.   3. Afib: her heart rate is under control at this time; will continue her asa 81 mg daily; will continue cardizem 30 mg every 8 hours and will monitor her status   4. Diabetes: is stable will continue humalog 5 units prior to meals for cbg >=150 and will monitor  5. Jerrye Bushy: will continue prilosec 20 mg daily   6. UTI: no recent infection present: will continue macrobid 50 mg daily   7. Anxiety: is emotionally stable; will continue zoloft 100 mg daily and xanax 0.25 mg twice daily as needed and will monitor   8. Chronic pain: her pain is presently managed; will continue tylenol 1 gm twice daily and oxycodone 5 mg every 6 hours as needed and will monitor       Ok Edwards NP [facility]  Contact [phone] Monday through Friday 8am- 5pm  After hours call [phone]

## 2014-01-02 ENCOUNTER — Ambulatory Visit: Payer: Medicare Other | Admitting: Neurology

## 2014-01-03 ENCOUNTER — Non-Acute Institutional Stay (SKILLED_NURSING_FACILITY): Payer: Medicare Other | Admitting: Adult Health

## 2014-01-03 DIAGNOSIS — I509 Heart failure, unspecified: Secondary | ICD-10-CM

## 2014-01-03 DIAGNOSIS — E876 Hypokalemia: Secondary | ICD-10-CM

## 2014-01-09 ENCOUNTER — Encounter: Payer: Self-pay | Admitting: Adult Health

## 2014-01-09 MED ORDER — FUROSEMIDE 20 MG PO TABS: 40.0000 mg | ORAL_TABLET | Freq: Every day | ORAL | Status: DC

## 2014-01-09 NOTE — Progress Notes (Signed)
Patient ID: Tara Huff, female   DOB: May 26, 1934, 80 y.o.   MRN: [id number]     [facility]  No Known Allergies   Chief Complaint  Patient presents with  . Acute Visit    weight changes and labs     HPI:  She has lost weight; her current weight is 259.2 pounds on 12-26-13 her weight was 281 pounds. She has required frequent medication adjustments in order for her to get the excessive water removed. She states she is feeling good. Her k+ level is slightly low at 3.4 and will require some adjustment.   Past Medical History  Diagnosis Date  . Hypertension   . Atrial fibrillation   . MI (myocardial infarction)   . Anxiety   . Hypercholesterolemia   . Shoulder pain   . Sleep apnea     intolerant to CPAP  . Type 2 diabetes mellitus   . OSA (obstructive sleep apnea)   . Hypertensive heart disease   . Morbid obesity   . Pyogenic granuloma   . Cancer     cervical  . Depression   . Pneumonia   . CHF (congestive heart failure)   . Cardiac arrest 11/14/2013  . Complete heart block 11/14/2013  . OSTEOMYELITIS 04/28/2010    L great Toe      . Carcinoma of endometrium 11/20/2013    Past Surgical History  Procedure Laterality Date  . Knee surgery      at age 89  . Cataract surgery      both eyes  . Abdominal hysterectomy    . Toe amputation      VITAL SIGNS BP 128/75  Pulse 86  Ht _0  (1.727 m)  Wt 259 lb 3.2 oz (117.572 kg)  BMI 39.42 kg/m2   Patient's Medications  New Prescriptions   No medications on file  Previous Medications   ACETAMINOPHEN (TYLENOL) 500 MG TABLET    Take 1,000 mg by mouth 2 (two) times daily.   ALPRAZOLAM (XANAX) 0.25 MG TABLET    Take one tablet by mouth every 12 hours as needed for anxiety   ASPIRIN EC 81 MG EC TABLET    Take 1 tablet (81 mg total) by mouth daily.   CHOLECALCIFEROL (VITAMIN D) 1000 UNITS CAPSULE    Take 1,000 Units by mouth daily.     DILTIAZEM (CARDIZEM) 30 MG TABLET    Take 30 mg by mouth 3 (three) times daily.    FUROSEMIDE (LASIX) 20 MG TABLET    Take 2 tablets (40 mg total) by mouth 2 (two) times daily.   INSULIN LISPRO (HUMALOG) 100 UNIT/ML INJECTION    Inject 5 Units into the skin 3 (three) times daily before meals. Give 5 units prior to meals for cbg >=150   NITROFURANTOIN (MACRODANTIN) 50 MG CAPSULE    Take 1 capsule (50 mg total) by mouth at bedtime. Start after ciprofloxacin doses are completed   OMEPRAZOLE (PRILOSEC) 20 MG CAPSULE    Take 20 mg by mouth daily.   OXYCODONE (OXY IR/ROXICODONE) 5 MG IMMEDIATE RELEASE TABLET    Take 1 tablet (5 mg total) by mouth every 6 (six) hours as needed for severe pain.   SERTRALINE (ZOLOFT) 100 MG TABLET    Take 1 tablet (100 mg total) by mouth at bedtime.   TRIAMCINOLONE OINTMENT (KENALOG) 0.1 %    Apply 1 application topically 2 (two) times daily.  Modified Medications   No medications on file  Discontinued Medications  No medications on file    SIGNIFICANT DIAGNOSTIC EXAMS  11-14-13: chest x-ray: Cardiomegaly and pulmonary vascular congestion / interstitial edema which appears increased compared to 04/27/2013. No pneumonia identified.  11-14-13: 2-d echo: Left ventricle: Paradoxical septal motion may be related both to pulmonary hypertension and pacing. EF difficult to aseesss. I think 40% range. The cavity size was normal. Wall thickness was normal. - Mitral valve: Mild regurgitation. - Left atrium: The atrium was moderately dilated. - Right ventricle: The pacing wire is probably seen. The cavity size was moderately dilated. Systolic function was mildly to moderately reduced. - Right atrium: The atrium was severely dilated. - Tricuspid valve: The valve appears to be grossly normal. Moderate-severe regurgitation.  11-15-13: chest x-ray: There is no evidence of a postprocedure complication following placement of the left internal jugular venous catheter. There are findings consistent with CHF with interstitial edema.  11-15-13: bilateral lower extremity  doppler: No evidence of deep vein thrombosis involving the visualized veins of the right lower extremity.- No evidence of deep vein thrombosis involving the visualized veins of the left lower extremity. - No evidence of Baker's cyst on the right or left.   11-17-13: chest x-ray: Cardiomegaly and resolved vascular congestion.  11-30-13: chest x-ray; cardiomegaly without evidence of acute cardiopulmonary disease   12-11-13; ekg: afib     LABS REVIEWED:   11-14-13: wbc 3.9; hgb 5.9; hct 17.0; mcv 91.4; plt 50; glucose 286; bun 57; creat 2.50; k+5.3; na++135 BNP 1616 (repeat hgb 15.0; hct 44.0) 11-15-13:wbc 9.0; hgb 11.8; hct 331.; mcv 88.5 plt 111; glucose 106; bun 60; creat 2.49; k+5.2; na++141 Alk phos 133; albumin 2.2 11-20-13: wbc 3.6; hgb 10.4; hct 29.5; mcv 90.5; plt 62; gluocse 68; bun 26; creat 1.73; k+4.2; na++135  hgb a1c 6.6   12-01-13: wbc 4.5; hgb 9.9; hct 32.1; mcv 96.1.;plt 343; glucose 119; bun 19; creat 1.0; k+5.4;na++ 133 liver normal albumin 2.5  12-19-13: glucose 144; bun 22; creat 1.1; k+ 5.0; na++ 137; BNP 398  12-29-13: glucose 139; bun 21; creat 1.1; k+ 3.4; na++139    Review of Systems  Constitutional: Negative for malaise/fatigue.  Eyes: Negative for blurred vision.  Respiratory: Negative for cough, shortness of breath and wheezing.   Cardiovascular: negative for edema Negative for chest pain and palpitations.  Gastrointestinal: negative for heartburn  Negative for constipation.  Musculoskeletal: Negative for joint pain and myalgias.  Skin: Negative.   Neurological: Negative for weakness.  Psychiatric/Behavioral: Negative for depression. The patient is not nervous/anxious.     Physical Exam  Constitutional: She is oriented to person, place, and time. No distress.  obese  Neck: Neck supple. No JVD present.  Cardiovascular:  Heart irreg/irreg Possible intermittent diastolic murmur Pedal pulses faint  Respiratory: Effort normal and breath sounds normal. No  respiratory distress. She has no wheezes.  GI: Soft. Bowel sounds are normal.There is no tenderness. .  Musculoskeletal: Normal range of motion. She exhibits edema.  Has chronic lymphedema lower extremities.   Neurological: She is alert and oriented to person, place, and time.  Skin: Skin is warm and dry. She is not diaphoretic.  Lower extremities discolored from pvd and pad   Psychiatric: She has a normal mood and affect.       ASSESSMENT/ PLAN:  1. Chf: will lower her lasix to one time daily; will her daily weights and will continue to monitor her status.   2. Hypokalemia: will begin k+ 20 meq daily for 3 days and will repeat a k+  level next week.     Ok Edwards NP [facility]  Contact [phone] Monday through Friday 8am- 5pm  After hours call [phone]

## 2014-01-11 ENCOUNTER — Non-Acute Institutional Stay (SKILLED_NURSING_FACILITY): Payer: Medicare Other | Admitting: Adult Health

## 2014-01-11 DIAGNOSIS — I509 Heart failure, unspecified: Secondary | ICD-10-CM

## 2014-01-15 ENCOUNTER — Non-Acute Institutional Stay (SKILLED_NURSING_FACILITY): Payer: Medicare Other | Admitting: Adult Health

## 2014-01-15 DIAGNOSIS — I480 Paroxysmal atrial fibrillation: Secondary | ICD-10-CM

## 2014-01-15 DIAGNOSIS — I509 Heart failure, unspecified: Secondary | ICD-10-CM

## 2014-01-15 DIAGNOSIS — N183 Chronic kidney disease, stage 3 unspecified: Secondary | ICD-10-CM

## 2014-01-15 DIAGNOSIS — I4891 Unspecified atrial fibrillation: Secondary | ICD-10-CM

## 2014-01-18 DIAGNOSIS — I509 Heart failure, unspecified: Secondary | ICD-10-CM

## 2014-01-18 DIAGNOSIS — M6281 Muscle weakness (generalized): Secondary | ICD-10-CM

## 2014-01-18 DIAGNOSIS — I110 Hypertensive heart disease with heart failure: Secondary | ICD-10-CM

## 2014-01-18 DIAGNOSIS — E119 Type 2 diabetes mellitus without complications: Secondary | ICD-10-CM

## 2014-01-18 NOTE — Progress Notes (Signed)
Patient ID: Tara Huff, female   DOB: June 23, 1934, 80 y.o.   MRN: [id number]     [facility]  No Known Allergies   Chief Complaint  Patient presents with  . Acute Visit    weight gain    HPI:  She has gained 3 pounds in the past day. She is not having an shortness of breath and does not have chest pain. she has chronic lymphedema present with no increased edema present. Her lasix was lowered to one time daily at the end of last month.    Past Medical History  Diagnosis Date  . Hypertension   . Atrial fibrillation   . MI (myocardial infarction)   . Anxiety   . Hypercholesterolemia   . Shoulder pain   . Sleep apnea     intolerant to CPAP  . Type 2 diabetes mellitus   . OSA (obstructive sleep apnea)   . Hypertensive heart disease   . Morbid obesity   . Pyogenic granuloma   . Cancer     cervical  . Depression   . Pneumonia   . CHF (congestive heart failure)   . Cardiac arrest 11/14/2013  . Complete heart block 11/14/2013  . OSTEOMYELITIS 04/28/2010    L great Toe      . Carcinoma of endometrium 11/20/2013    Past Surgical History  Procedure Laterality Date  . Knee surgery      at age 67  . Cataract surgery      both eyes  . Abdominal hysterectomy    . Toe amputation      VITAL SIGNS BP 119/79  Pulse 69  Ht '5\' 8"'  (1.727 m)  Wt 268 lb 3.2 oz (121.655 kg)  BMI 40.79 kg/m2   Patient's Medications  New Prescriptions   No medications on file  Previous Medications   ACETAMINOPHEN (TYLENOL) 500 MG TABLET    Take 1,000 mg by mouth 2 (two) times daily.   ALPRAZOLAM (XANAX) 0.25 MG TABLET    Take one tablet by mouth every 12 hours as needed for anxiety   ASPIRIN EC 81 MG EC TABLET    Take 1 tablet (81 mg total) by mouth daily.   CHOLECALCIFEROL (VITAMIN D) 1000 UNITS CAPSULE    Take 1,000 Units by mouth daily.     DILTIAZEM (CARDIZEM) 30 MG TABLET    Take 30 mg by mouth 3 (three) times daily.    FUROSEMIDE (LASIX) 20 MG TABLET    Take 2 tablets (40 mg total)  by mouth daily.   INSULIN LISPRO (HUMALOG) 100 UNIT/ML INJECTION    Inject 5 Units into the skin 3 (three) times daily before meals. Give 5 units prior to meals for cbg >=150   NITROFURANTOIN (MACRODANTIN) 50 MG CAPSULE    Take 1 capsule (50 mg total) by mouth at bedtime. Start after ciprofloxacin doses are completed   OMEPRAZOLE (PRILOSEC) 20 MG CAPSULE    Take 20 mg by mouth daily.   OXYCODONE (OXY IR/ROXICODONE) 5 MG IMMEDIATE RELEASE TABLET    Take 1 tablet (5 mg total) by mouth every 6 (six) hours as needed for severe pain.   SERTRALINE (ZOLOFT) 100 MG TABLET    Take 1 tablet (100 mg total) by mouth at bedtime.   TRIAMCINOLONE OINTMENT (KENALOG) 0.1 %    Apply 1 application topically 2 (two) times daily.  Modified Medications   No medications on file  Discontinued Medications   No medications on file    SIGNIFICANT  DIAGNOSTIC EXAMS  11-14-13: chest x-ray: Cardiomegaly and pulmonary vascular congestion / interstitial edema which appears increased compared to 04/27/2013. No pneumonia identified.  11-14-13: 2-d echo: Left ventricle: Paradoxical septal motion may be related both to pulmonary hypertension and pacing. EF difficult to aseesss. I think 40% range. The cavity size was normal. Wall thickness was normal. - Mitral valve: Mild regurgitation. - Left atrium: The atrium was moderately dilated. - Right ventricle: The pacing wire is probably seen. The cavity size was moderately dilated. Systolic function was mildly to moderately reduced. - Right atrium: The atrium was severely dilated. - Tricuspid valve: The valve appears to be grossly normal. Moderate-severe regurgitation.  11-15-13: chest x-ray: There is no evidence of a postprocedure complication following placement of the left internal jugular venous catheter. There are findings consistent with CHF with interstitial edema.  11-15-13: bilateral lower extremity doppler: No evidence of deep vein thrombosis involving the visualized veins of the  right lower extremity.- No evidence of deep vein thrombosis involving the visualized veins of the left lower extremity. - No evidence of Baker's cyst on the right or left.   11-17-13: chest x-ray: Cardiomegaly and resolved vascular congestion.  11-30-13: chest x-ray; cardiomegaly without evidence of acute cardiopulmonary disease   12-11-13; ekg: afib     LABS REVIEWED:   11-14-13: wbc 3.9; hgb 5.9; hct 17.0; mcv 91.4; plt 50; glucose 286; bun 57; creat 2.50; k+5.3; na++135 BNP 1616 (repeat hgb 15.0; hct 44.0) 11-15-13:wbc 9.0; hgb 11.8; hct 331.; mcv 88.5 plt 111; glucose 106; bun 60; creat 2.49; k+5.2; na++141 Alk phos 133; albumin 2.2 11-20-13: wbc 3.6; hgb 10.4; hct 29.5; mcv 90.5; plt 62; gluocse 68; bun 26; creat 1.73; k+4.2; na++135  hgb a1c 6.6   12-01-13: wbc 4.5; hgb 9.9; hct 32.1; mcv 96.1.;plt 343; glucose 119; bun 19; creat 1.0; k+5.4;na++ 133 liver normal albumin 2.5  12-19-13: glucose 144; bun 22; creat 1.1; k+ 5.0; na++ 137; BNP 398  12-29-13: glucose 139; bun 21; creat 1.1; k+ 3.4; na++139 01-10-14: k+ 4.3     Review of Systems  Constitutional: Negative for malaise/fatigue.  Eyes: Negative for blurred vision.  Respiratory: Negative for cough, shortness of breath and wheezing.   Cardiovascular: negative for edema Negative for chest pain and palpitations.  Gastrointestinal: negative for heartburn  Negative for constipation.  Musculoskeletal: Negative for joint pain and myalgias.  Skin: Negative.   Neurological: Negative for weakness.  Psychiatric/Behavioral: Negative for depression. The patient is not nervous/anxious.     Physical Exam  Constitutional: She is oriented to person, place, and time. No distress.  obese  Neck: Neck supple. No JVD present.  Cardiovascular:  Heart irreg/irreg Possible intermittent diastolic murmur Pedal pulses faint  Respiratory: Effort normal and breath sounds normal. No respiratory distress. She has no wheezes.  GI: Soft. Bowel sounds are  normal.There is no tenderness. .  Musculoskeletal: Normal range of motion. She exhibits edema.  Has chronic lymphedema lower extremities.   Neurological: She is alert and oriented to person, place, and time.  Skin: Skin is warm and dry. She is not diaphoretic.  Lower extremities discolored from pvd and pad   Psychiatric: She has a normal mood and affect.       ASSESSMENT/ PLAN:   1. CHF: due to her 3 pound weight gain; will give her an extra lasix 40 mg today; and will continue to monitor her status. May need to consider returning her back to twice daily dosing of her lasix if she continues  to gain weight. Will monitor her status.       Ok Edwards NP [facility]  Contact [phone] Monday through Friday 8am- 5pm  After hours call [phone]

## 2014-01-20 ENCOUNTER — Encounter: Payer: Self-pay | Admitting: Adult Health

## 2014-01-20 NOTE — Progress Notes (Signed)
Patient ID: Tara Huff, female   DOB: Mar 12, 1934, 80 y.o.   MRN: [id number]     [facility]  No Known Allergies   Chief Complaint  Patient presents with  . Discharge Note    HPI:  She is being discharged to home with home health for pt/ot/nursing/home health aid/ sacral wound management. She will not need dme. She will need prescriptions to be written. she has been instructed to continue to weighing herself daily and to call her cardiologist if she gains more than 3 pounds in one day or 5 pounds in one week. She did verbalize understanding and stated she would continue to do this.    Past Medical History  Diagnosis Date  . Hypertension   . Atrial fibrillation   . MI (myocardial infarction)   . Anxiety   . Hypercholesterolemia   . Shoulder pain   . Sleep apnea     intolerant to CPAP  . Type 2 diabetes mellitus   . OSA (obstructive sleep apnea)   . Hypertensive heart disease   . Morbid obesity   . Pyogenic granuloma   . Cancer     cervical  . Depression   . Pneumonia   . CHF (congestive heart failure)   . Cardiac arrest 11/14/2013  . Complete heart block 11/14/2013  . OSTEOMYELITIS 04/28/2010    L great Toe      . Carcinoma of endometrium 11/20/2013    Past Surgical History  Procedure Laterality Date  . Knee surgery      at age 89  . Cataract surgery      both eyes  . Abdominal hysterectomy    . Toe amputation      VITAL SIGNS BP 116/78  Pulse 98  Ht '5\' 8"'  (1.727 m)  Wt 265 lb (120.203 kg)  BMI 40.30 kg/m2   Patient's Medications  New Prescriptions   No medications on file  Previous Medications   ACETAMINOPHEN (TYLENOL) 500 MG TABLET    Take 1,000 mg by mouth 2 (two) times daily.   ALPRAZOLAM (XANAX) 0.25 MG TABLET    Take one tablet by mouth every 12 hours as needed for anxiety   ASPIRIN EC 81 MG EC TABLET    Take 1 tablet (81 mg total) by mouth daily.   CHOLECALCIFEROL (VITAMIN D) 1000 UNITS CAPSULE    Take 1,000 Units by mouth daily.     DILTIAZEM (CARDIZEM) 30 MG TABLET    Take 30 mg by mouth 3 (three) times daily.    FUROSEMIDE (LASIX) 20 MG TABLET    Take 2 tablets (40 mg total) by mouth daily.   INSULIN LISPRO (HUMALOG) 100 UNIT/ML INJECTION    Inject 5 Units into the skin 3 (three) times daily before meals. Give 5 units prior to meals for cbg >=150   NITROFURANTOIN (MACRODANTIN) 50 MG CAPSULE    Take 1 capsule (50 mg total) by mouth at bedtime. Start after ciprofloxacin doses are completed   OMEPRAZOLE (PRILOSEC) 20 MG CAPSULE    Take 20 mg by mouth daily.   OXYCODONE (OXY IR/ROXICODONE) 5 MG IMMEDIATE RELEASE TABLET    Take 1 tablet (5 mg total) by mouth every 6 (six) hours as needed for severe pain.   SERTRALINE (ZOLOFT) 100 MG TABLET    Take 1 tablet (100 mg total) by mouth at bedtime.   TRIAMCINOLONE OINTMENT (KENALOG) 0.1 %    Apply 1 application topically 2 (two) times daily.  Modified Medications   No  medications on file  Discontinued Medications   No medications on file    SIGNIFICANT DIAGNOSTIC EXAMS   11-14-13: chest x-ray: Cardiomegaly and pulmonary vascular congestion / interstitial edema which appears increased compared to 04/27/2013. No pneumonia identified.  11-14-13: 2-d echo: Left ventricle: Paradoxical septal motion may be related both to pulmonary hypertension and pacing. EF difficult to aseesss. I think 40% range. The cavity size was normal. Wall thickness was normal. - Mitral valve: Mild regurgitation. - Left atrium: The atrium was moderately dilated. - Right ventricle: The pacing wire is probably seen. The cavity size was moderately dilated. Systolic function was mildly to moderately reduced. - Right atrium: The atrium was severely dilated. - Tricuspid valve: The valve appears to be grossly normal. Moderate-severe regurgitation.  11-15-13: chest x-ray: There is no evidence of a postprocedure complication following placement of the left internal jugular venous catheter. There are findings consistent with  CHF with interstitial edema.  11-15-13: bilateral lower extremity doppler: No evidence of deep vein thrombosis involving the visualized veins of the right lower extremity.- No evidence of deep vein thrombosis involving the visualized veins of the left lower extremity. - No evidence of Baker's cyst on the right or left.   11-17-13: chest x-ray: Cardiomegaly and resolved vascular congestion.  11-30-13: chest x-ray; cardiomegaly without evidence of acute cardiopulmonary disease   12-11-13; ekg: afib     LABS REVIEWED:   11-14-13: wbc 3.9; hgb 5.9; hct 17.0; mcv 91.4; plt 50; glucose 286; bun 57; creat 2.50; k+5.3; na++135 BNP 1616 (repeat hgb 15.0; hct 44.0) 11-15-13:wbc 9.0; hgb 11.8; hct 331.; mcv 88.5 plt 111; glucose 106; bun 60; creat 2.49; k+5.2; na++141 Alk phos 133; albumin 2.2 11-20-13: wbc 3.6; hgb 10.4; hct 29.5; mcv 90.5; plt 62; gluocse 68; bun 26; creat 1.73; k+4.2; na++135  hgb a1c 6.6   12-01-13: wbc 4.5; hgb 9.9; hct 32.1; mcv 96.1.;plt 343; glucose 119; bun 19; creat 1.0; k+5.4;na++ 133 liver normal albumin 2.5  12-19-13: glucose 144; bun 22; creat 1.1; k+ 5.0; na++ 137; BNP 398  12-29-13: glucose 139; bun 21; creat 1.1; k+ 3.4; na++139 01-10-14: k+ 4.3     Review of Systems  Constitutional: Negative for malaise/fatigue.  Eyes: Negative for blurred vision.  Respiratory: Negative for cough, shortness of breath and wheezing.   Cardiovascular: negative for edema Negative for chest pain and palpitations.  Gastrointestinal: negative for heartburn  Negative for constipation.  Musculoskeletal: Negative for joint pain and myalgias.  Skin: Negative.   Neurological: Negative for weakness.  Psychiatric/Behavioral: Negative for depression. The patient is not nervous/anxious.     Physical Exam  Constitutional: She is oriented to person, place, and time. No distress.  obese  Neck: Neck supple. No JVD present.  Cardiovascular:  Heart irreg/irreg Possible intermittent diastolic  murmur Pedal pulses faint  Respiratory: Effort normal and breath sounds normal. No respiratory distress. She has no wheezes.  GI: Soft. Bowel sounds are normal.There is no tenderness. .  Musculoskeletal: Normal range of motion. She exhibits edema.  Has chronic lymphedema lower extremities.   Neurological: She is alert and oriented to person, place, and time.  Skin: Skin is warm and dry. She is not diaphoretic.  Lower extremities discolored from pvd and pad   Psychiatric: She has a normal mood and affect.        ASSESSMENT/ PLAN:  She is being discharged tohome with home health for pt/ot/nursing/home health aid/sacral wound management. Will not need dme; her prescriptions have been written.  Time spent with patients 45 minutes.       Ok Edwards NP [facility]  Contact [phone] Monday through Friday 8am- 5pm  After hours call [phone]

## 2014-01-23 ENCOUNTER — Ambulatory Visit: Payer: Medicare Other | Admitting: Cardiology

## 2014-01-28 ENCOUNTER — Emergency Department (HOSPITAL_COMMUNITY): Payer: Medicare Other

## 2014-01-28 ENCOUNTER — Encounter (HOSPITAL_COMMUNITY): Payer: Self-pay | Admitting: Emergency Medicine

## 2014-01-28 ENCOUNTER — Inpatient Hospital Stay (HOSPITAL_COMMUNITY)
Admission: EM | Admit: 2014-01-28 | Discharge: 2014-02-01 | DRG: 689 | Disposition: A | Discharge status: Skilled Nursing Facility | Payer: Medicare Other | Attending: Internal Medicine | Admitting: Internal Medicine

## 2014-01-28 DIAGNOSIS — G4733 Obstructive sleep apnea (adult) (pediatric): Secondary | ICD-10-CM | POA: Diagnosis present

## 2014-01-28 DIAGNOSIS — I442 Atrioventricular block, complete: Secondary | ICD-10-CM | POA: Diagnosis present

## 2014-01-28 DIAGNOSIS — E1159 Type 2 diabetes mellitus with other circulatory complications: Secondary | ICD-10-CM | POA: Diagnosis present

## 2014-01-28 DIAGNOSIS — L0291 Cutaneous abscess, unspecified: Secondary | ICD-10-CM | POA: Diagnosis present

## 2014-01-28 DIAGNOSIS — Z8542 Personal history of malignant neoplasm of other parts of uterus: Secondary | ICD-10-CM

## 2014-01-28 DIAGNOSIS — L0390 Cellulitis, unspecified: Secondary | ICD-10-CM

## 2014-01-28 DIAGNOSIS — G9341 Metabolic encephalopathy: Secondary | ICD-10-CM | POA: Diagnosis present

## 2014-01-28 DIAGNOSIS — N183 Chronic kidney disease, stage 3 unspecified: Secondary | ICD-10-CM | POA: Diagnosis present

## 2014-01-28 DIAGNOSIS — I130 Hypertensive heart and chronic kidney disease with heart failure and stage 1 through stage 4 chronic kidney disease, or unspecified chronic kidney disease: Secondary | ICD-10-CM | POA: Diagnosis present

## 2014-01-28 DIAGNOSIS — R4182 Altered mental status, unspecified: Secondary | ICD-10-CM

## 2014-01-28 DIAGNOSIS — Z79899 Other long term (current) drug therapy: Secondary | ICD-10-CM

## 2014-01-28 DIAGNOSIS — I509 Heart failure, unspecified: Secondary | ICD-10-CM | POA: Diagnosis present

## 2014-01-28 DIAGNOSIS — R197 Diarrhea, unspecified: Secondary | ICD-10-CM

## 2014-01-28 DIAGNOSIS — F329 Major depressive disorder, single episode, unspecified: Secondary | ICD-10-CM | POA: Diagnosis present

## 2014-01-28 DIAGNOSIS — E7800 Pure hypercholesterolemia, unspecified: Secondary | ICD-10-CM | POA: Diagnosis present

## 2014-01-28 DIAGNOSIS — B961 Klebsiella pneumoniae [K. pneumoniae] as the cause of diseases classified elsewhere: Secondary | ICD-10-CM | POA: Diagnosis present

## 2014-01-28 DIAGNOSIS — N189 Chronic kidney disease, unspecified: Secondary | ICD-10-CM

## 2014-01-28 DIAGNOSIS — F3289 Other specified depressive episodes: Secondary | ICD-10-CM | POA: Diagnosis present

## 2014-01-28 DIAGNOSIS — Z6841 Body Mass Index (BMI) 40.0 and over, adult: Secondary | ICD-10-CM

## 2014-01-28 DIAGNOSIS — I252 Old myocardial infarction: Secondary | ICD-10-CM

## 2014-01-28 DIAGNOSIS — I480 Paroxysmal atrial fibrillation: Secondary | ICD-10-CM | POA: Diagnosis present

## 2014-01-28 DIAGNOSIS — I4891 Unspecified atrial fibrillation: Secondary | ICD-10-CM | POA: Diagnosis present

## 2014-01-28 DIAGNOSIS — Z7982 Long term (current) use of aspirin: Secondary | ICD-10-CM

## 2014-01-28 DIAGNOSIS — I798 Other disorders of arteries, arterioles and capillaries in diseases classified elsewhere: Secondary | ICD-10-CM | POA: Diagnosis present

## 2014-01-28 DIAGNOSIS — Z87891 Personal history of nicotine dependence: Secondary | ICD-10-CM

## 2014-01-28 DIAGNOSIS — R5381 Other malaise: Secondary | ICD-10-CM

## 2014-01-28 DIAGNOSIS — G9340 Encephalopathy, unspecified: Secondary | ICD-10-CM

## 2014-01-28 DIAGNOSIS — Z7401 Bed confinement status: Secondary | ICD-10-CM

## 2014-01-28 DIAGNOSIS — N390 Urinary tract infection, site not specified: Principal | ICD-10-CM | POA: Diagnosis present

## 2014-01-28 DIAGNOSIS — F411 Generalized anxiety disorder: Secondary | ICD-10-CM | POA: Diagnosis present

## 2014-01-28 LAB — CBC WITH DIFFERENTIAL/PLATELET
Basophils Absolute: 0 10*3/uL (ref 0.0–0.1)
Basophils Relative: 0 % (ref 0–1)
EOS PCT: 1 % (ref 0–5)
Eosinophils Absolute: 0 10*3/uL (ref 0.0–0.7)
HCT: 39 % (ref 36.0–46.0)
Hemoglobin: 13 g/dL (ref 12.0–15.0)
LYMPHS PCT: 11 % — AB (ref 12–46)
Lymphs Abs: 0.7 10*3/uL (ref 0.7–4.0)
MCH: 30.7 pg (ref 26.0–34.0)
MCHC: 33.3 g/dL (ref 30.0–36.0)
MCV: 92 fL (ref 78.0–100.0)
Monocytes Absolute: 0.9 10*3/uL (ref 0.1–1.0)
Monocytes Relative: 13 % — ABNORMAL HIGH (ref 3–12)
NEUTROS PCT: 76 % (ref 43–77)
Neutro Abs: 5.2 10*3/uL (ref 1.7–7.7)
PLATELETS: 180 10*3/uL (ref 150–400)
RBC: 4.24 MIL/uL (ref 3.87–5.11)
RDW: 15.5 % (ref 11.5–15.5)
WBC: 6.8 10*3/uL (ref 4.0–10.5)

## 2014-01-28 LAB — BASIC METABOLIC PANEL
BUN: 47 mg/dL — ABNORMAL HIGH (ref 6–23)
CO2: 21 meq/L (ref 19–32)
Calcium: 9.2 mg/dL (ref 8.4–10.5)
Chloride: 103 mEq/L (ref 96–112)
Creatinine, Ser: 1.42 mg/dL — ABNORMAL HIGH (ref 0.50–1.10)
GFR calc Af Amer: 40 mL/min — ABNORMAL LOW (ref >90)
GFR, EST NON AFRICAN AMERICAN: 35 mL/min — AB (ref >90)
Glucose, Bld: 107 mg/dL — ABNORMAL HIGH (ref 70–99)
Potassium: 4.8 mEq/L (ref 3.7–5.3)
SODIUM: 140 meq/L (ref 137–147)

## 2014-01-28 LAB — TROPONIN I: Troponin I: <0.3 ng/mL (ref <0.30)

## 2014-01-28 LAB — URINALYSIS, ROUTINE W REFLEX MICROSCOPIC
Bilirubin Urine: NEGATIVE
Glucose, UA: NEGATIVE mg/dL
KETONES UR: NEGATIVE mg/dL
NITRITE: POSITIVE — AB
PH: 5 (ref 5.0–8.0)
PROTEIN: NEGATIVE mg/dL
Specific Gravity, Urine: 1.013 (ref 1.005–1.030)
Urobilinogen, UA: 0.2 mg/dL (ref 0.0–1.0)

## 2014-01-28 LAB — CK: Total CK: 356 U/L — ABNORMAL HIGH (ref 7–177)

## 2014-01-28 LAB — URINE MICROSCOPIC-ADD ON

## 2014-01-28 LAB — PROTIME-INR
INR: 1.28 (ref 0.00–1.49)
PROTHROMBIN TIME: 15.7 s — AB (ref 11.6–15.2)

## 2014-01-28 LAB — GLUCOSE, CAPILLARY: GLUCOSE-CAPILLARY: 102 mg/dL — AB (ref 70–99)

## 2014-01-28 LAB — LACTIC ACID, PLASMA: LACTIC ACID, VENOUS: 1.3 mmol/L (ref 0.5–2.2)

## 2014-01-28 MED ORDER — VANCOMYCIN HCL 10 G IV SOLR
2000.0000 mg | Freq: Once | INTRAVENOUS | Status: AC
  Administered 2014-01-28: 2000 mg via INTRAVENOUS
  Filled 2014-01-28: qty 2000

## 2014-01-28 MED ORDER — SPIRONOLACTONE 25 MG PO TABS
25.0000 mg | ORAL_TABLET | Freq: Every day | ORAL | Status: DC
  Administered 2014-01-29 – 2014-02-01 (×4): 25 mg via ORAL
  Filled 2014-01-28 (×4): qty 1

## 2014-01-28 MED ORDER — SODIUM CHLORIDE 0.9 % IV SOLN
1000.0000 mL | INTRAVENOUS | Status: DC
  Administered 2014-01-28: 1000 mL via INTRAVENOUS

## 2014-01-28 MED ORDER — DEXTROSE 5 % IV SOLN
1.0000 g | INTRAVENOUS | Status: DC
  Administered 2014-01-28 – 2014-01-30 (×3): 1 g via INTRAVENOUS
  Filled 2014-01-28 (×4): qty 10

## 2014-01-28 MED ORDER — HEPARIN SODIUM (PORCINE) 5000 UNIT/ML IJ SOLN
5000.0000 [IU] | Freq: Three times a day (TID) | INTRAMUSCULAR | Status: DC
  Administered 2014-01-28 – 2014-02-01 (×10): 5000 [IU] via SUBCUTANEOUS
  Filled 2014-01-28 (×14): qty 1

## 2014-01-28 MED ORDER — MORPHINE SULFATE 4 MG/ML IJ SOLN
4.0000 mg | Freq: Once | INTRAMUSCULAR | Status: AC
  Administered 2014-01-28: 4 mg via INTRAVENOUS
  Filled 2014-01-28: qty 1

## 2014-01-28 MED ORDER — FUROSEMIDE 40 MG PO TABS
40.0000 mg | ORAL_TABLET | Freq: Every day | ORAL | Status: DC
  Administered 2014-01-29 – 2014-02-01 (×4): 40 mg via ORAL
  Filled 2014-01-28 (×4): qty 1

## 2014-01-28 MED ORDER — VANCOMYCIN HCL 10 G IV SOLR
1500.0000 mg | INTRAVENOUS | Status: DC
  Administered 2014-01-29 – 2014-01-30 (×2): 1500 mg via INTRAVENOUS
  Filled 2014-01-28 (×3): qty 1500

## 2014-01-28 MED ORDER — SERTRALINE HCL 100 MG PO TABS
100.0000 mg | ORAL_TABLET | Freq: Every day | ORAL | Status: DC
  Administered 2014-01-28 – 2014-01-31 (×4): 100 mg via ORAL
  Filled 2014-01-28 (×5): qty 1

## 2014-01-28 MED ORDER — SODIUM CHLORIDE 0.9 % IV SOLN
INTRAVENOUS | Status: DC
  Administered 2014-01-28: 75 mL/h via INTRAVENOUS
  Administered 2014-01-29: 14:00:00 via INTRAVENOUS

## 2014-01-28 MED ORDER — INSULIN ASPART 100 UNIT/ML ~~LOC~~ SOLN
0.0000 [IU] | Freq: Three times a day (TID) | SUBCUTANEOUS | Status: DC
  Administered 2014-01-30 – 2014-01-31 (×2): 1 [IU] via SUBCUTANEOUS

## 2014-01-28 MED ORDER — DILTIAZEM HCL 30 MG PO TABS
30.0000 mg | ORAL_TABLET | Freq: Three times a day (TID) | ORAL | Status: DC
  Administered 2014-01-29 – 2014-02-01 (×9): 30 mg via ORAL
  Filled 2014-01-28 (×12): qty 1

## 2014-01-28 MED ORDER — ONDANSETRON HCL 4 MG/2ML IJ SOLN: 4.0000 mg | Freq: Four times a day (QID) | INTRAMUSCULAR | Status: DC | PRN

## 2014-01-28 MED ORDER — DEXTROSE 5 % IV SOLN
1.0000 g | Freq: Once | INTRAVENOUS | Status: AC
  Administered 2014-01-28: 1 g via INTRAVENOUS
  Filled 2014-01-28: qty 10

## 2014-01-28 MED ORDER — SODIUM CHLORIDE 0.9 % IV SOLN
1000.0000 mL | Freq: Once | INTRAVENOUS | Status: AC
  Administered 2014-01-28: 1000 mL via INTRAVENOUS

## 2014-01-28 MED ORDER — ASPIRIN EC 81 MG PO TBEC
81.0000 mg | DELAYED_RELEASE_TABLET | Freq: Every day | ORAL | Status: DC
  Administered 2014-01-28 – 2014-02-01 (×5): 81 mg via ORAL
  Filled 2014-01-28 (×5): qty 1

## 2014-01-28 MED ORDER — ACETAMINOPHEN 500 MG PO TABS
1000.0000 mg | ORAL_TABLET | Freq: Two times a day (BID) | ORAL | Status: DC
  Administered 2014-01-28 – 2014-02-01 (×8): 1000 mg via ORAL
  Filled 2014-01-28 (×11): qty 2

## 2014-01-28 MED ORDER — OXYCODONE HCL 5 MG PO TABS
5.0000 mg | ORAL_TABLET | Freq: Four times a day (QID) | ORAL | Status: DC | PRN
  Administered 2014-01-28 – 2014-01-31 (×4): 5 mg via ORAL
  Filled 2014-01-28 (×5): qty 1

## 2014-01-28 MED ORDER — ONDANSETRON HCL 4 MG PO TABS: 4.0000 mg | ORAL_TABLET | Freq: Four times a day (QID) | ORAL | Status: DC | PRN

## 2014-01-28 NOTE — Progress Notes (Signed)
ANTIBIOTIC CONSULT NOTE - INITIAL  Pharmacy Consult for Vancomycin and Rocephin Indication: empiric coverage for cellulitis and UTI  No Known Allergies  Patient Measurements: Height: 5\' 5"  (165.1 cm) Weight: 265 lb (120.203 kg) IBW/kg (Calculated) : 57  Vital Signs: Temp: 97.6 F (36.4 C) (03/22 1627) Temp src: Oral (03/22 1627) BP: 118/69 mmHg (03/22 1627) Pulse Rate: 115 (03/22 1627) Intake/Output from previous day:   Intake/Output from this shift:    Labs:  Recent Labs  01/28/14 1215  WBC 6.8  HGB 13.0  PLT 180  CREATININE 1.42*   Estimated Creatinine Clearance: 43.1 ml/min (by C-G formula based on Cr of 1.42).    Microbiology: No results found for this or any previous visit (from the past 720 hour(s)).  Medical History: Past Medical History  Diagnosis Date  . Hypertension   . Atrial fibrillation   . MI (myocardial infarction)   . Anxiety   . Hypercholesterolemia   . Shoulder pain   . Sleep apnea     intolerant to CPAP  . Type 2 diabetes mellitus   . OSA (obstructive sleep apnea)   . Hypertensive heart disease   . Morbid obesity   . Pyogenic granuloma   . Cancer     cervical  . Depression   . Pneumonia   . CHF (congestive heart failure)   . Cardiac arrest 11/14/2013  . Complete heart block 11/14/2013  . OSTEOMYELITIS 04/28/2010    L great Toe      . Carcinoma of endometrium 11/20/2013    Medications:  Prescriptions prior to admission  Medication Sig Dispense Refill  . acetaminophen (TYLENOL) 500 MG tablet Take 1,000 mg by mouth 2 (two) times daily.      Marland Kitchen ALPRAZolam (XANAX) 0.25 MG tablet Take 0.25-0.5 mg by mouth 3 (three) times daily as needed for anxiety. Take one tablet by mouth every 12 hours as needed for anxiety      . aspirin EC 81 MG EC tablet Take 1 tablet (81 mg total) by mouth daily.  30 tablet  3  . Cholecalciferol (VITAMIN D) 1000 UNITS capsule Take 1,000 Units by mouth daily.        Marland Kitchen diltiazem (CARDIZEM) 30 MG tablet Take 30 mg by  mouth 3 (three) times daily.       . furosemide (LASIX) 20 MG tablet Take 2 tablets (40 mg total) by mouth daily.  30 tablet  3  . glimepiride (AMARYL) 2 MG tablet Take 2 mg by mouth daily with breakfast.      . metformin (FORTAMET) 500 MG (OSM) 24 hr tablet Take 500 mg by mouth daily with breakfast.      . oxyCODONE (OXY IR/ROXICODONE) 5 MG immediate release tablet Take 1 tablet (5 mg total) by mouth every 6 (six) hours as needed for severe pain.  20 tablet  0  . sertraline (ZOLOFT) 100 MG tablet Take 1 tablet (100 mg total) by mouth at bedtime.  30 tablet  0  . spironolactone (ALDACTONE) 25 MG tablet Take 25 mg by mouth daily.       Assessment: Pt is a 80 yo F who presented to the ED with AMS.  CT in ED was negative for acute changes.  UA was concerning for UTI and a cx is pending.  Also concern for cellulitis as sourse of infection.  WBC is WNL.  SCr is slightly elevated at 1.42 with estimated CrCl ~ 40.  Goal of Therapy:  Vancomycin trough level 10-15 mcg/ml  Renal dose adjustment of medications  Plan:  Vancomycin 2000 mg IV x 1 dose now.  Then Vancomycin 1500 mg IV q24h. Rocephin 1gm IV q24h. Follow renal function, culture data, and clinical progress. Check Vancomycin trough as indicated.  [employer], Pharm.D., BCPS Clinical Pharmacist Pager [phone] 01/28/2014 8:49 PM

## 2014-01-28 NOTE — ED Notes (Signed)
PER EMS report - Husband reported finding PT on floor this AM unsure when the fall occurred. Husband reported to EMS Pt was last seen normal at 2000 on 01-27-13. Pt speech reported to be slurred . Pt also reported leg pain on arrival to ED.

## 2014-01-28 NOTE — ED Notes (Signed)
Pt husband states that he is leaving for the evening. Number-[phone]

## 2014-01-28 NOTE — H&P (Signed)
[facility] History and Physical  DONICIA EMRY LDJ:[id number] DOB: 03/09/1934 DOA: 01/28/2014  Referring physician: Dr. Patria Mane PCP: Allean Found, MD   Chief Complaint: Altered mental status  HPI: Tara Huff is a 80 y.o. female  With history of paroxysmal atrial fibrillation on aspirin, recent fall at home, hypertension, and anxiety on Xanax. Who presented to the ED with chief complaint of altered mental status. The patient was brought to the emergency department after the husband noticed that wife at home was altered mentally.  While in the ED patient had CT of head which reported no acute abnormalities. Urinalysis was indicative of active urinary tract infection and urine culture was sent. Cultures were also collected in the ED. Given persistent outward mental status we were consulted for further evaluation and recommendations.   Review of Systems:  Unable to accurately assess due to altered mental status.  Past Medical History  Diagnosis Date  . Hypertension   . Atrial fibrillation   . MI (myocardial infarction)   . Anxiety   . Hypercholesterolemia   . Shoulder pain   . Sleep apnea     intolerant to CPAP  . Type 2 diabetes mellitus   . OSA (obstructive sleep apnea)   . Hypertensive heart disease   . Morbid obesity   . Pyogenic granuloma   . Cancer     cervical  . Depression   . Pneumonia   . CHF (congestive heart failure)   . Cardiac arrest 11/14/2013  . Complete heart block 11/14/2013  . OSTEOMYELITIS 04/28/2010    L great Toe      . Carcinoma of endometrium 11/20/2013   Past Surgical History  Procedure Laterality Date  . Knee surgery      at age 29  . Cataract surgery      both eyes  . Abdominal hysterectomy    . Toe amputation     Social History:  reports that she quit smoking about 21 years ago. Her smoking use included Cigarettes. She has a 20 pack-year smoking history. She has never used smokeless tobacco. She reports that she does not  drink alcohol or use illicit drugs.  No Known Allergies  Family History  Problem Relation Age of Onset  . Emphysema Father   . Cancer Mother     intestinal  . Cancer Father      Prior to Admission medications   Medication Sig Start Date End Date Taking? Authorizing Provider  acetaminophen (TYLENOL) 500 MG tablet Take 1,000 mg by mouth 2 (two) times daily.   Yes Historical Provider, MD  ALPRAZolam (XANAX) 0.25 MG tablet Take 0.25-0.5 mg by mouth 3 (three) times daily as needed for anxiety. Take one tablet by mouth every 12 hours as needed for anxiety 12/20/13  Yes Oneal Grout, MD  aspirin EC 81 MG EC tablet Take 1 tablet (81 mg total) by mouth daily. 05/01/13  Yes Ripudeep Jenna Luo, MD  Cholecalciferol (VITAMIN D) 1000 UNITS capsule Take 1,000 Units by mouth daily.     Yes Historical Provider, MD  diltiazem (CARDIZEM) 30 MG tablet Take 30 mg by mouth 3 (three) times daily.    Yes Historical Provider, MD  furosemide (LASIX) 20 MG tablet Take 2 tablets (40 mg total) by mouth daily. 01/03/14  Yes Sharee Holster, NP  glimepiride (AMARYL) 2 MG tablet Take 2 mg by mouth daily with breakfast.   Yes Historical Provider, MD  metformin (FORTAMET) 500 MG (OSM) 24 hr tablet Take 500 mg  by mouth daily with breakfast.   Yes Historical Provider, MD  oxyCODONE (OXY IR/ROXICODONE) 5 MG immediate release tablet Take 1 tablet (5 mg total) by mouth every 6 (six) hours as needed for severe pain. 11/24/13  Yes Ripudeep Krystal Eaton, MD  sertraline (ZOLOFT) 100 MG tablet Take 1 tablet (100 mg total) by mouth at bedtime. 11/24/13  Yes Ripudeep Krystal Eaton, MD  spironolactone (ALDACTONE) 25 MG tablet Take 25 mg by mouth daily.   Yes Historical Provider, MD   Physical Exam: Filed Vitals:   01/28/14 1519  BP: 113/55  Pulse: 96  Temp:   Resp: 20    BP 113/55  Pulse 96  Temp(Src) 97.6 F (36.4 C) (Oral)  Resp 20  Ht 5\' 5"  (1.651 m)  Wt 120.203 kg (265 lb)  BMI 44.10 kg/m2  SpO2 96%  General:  Alert and awake, not  oriented to place or time or president Eyes: PERRL, normal lids, irises & conjunctiva ENT: grossly normal hearing, lips & tongue Neck: no LAD, masses or thyromegaly Cardiovascular: Irregularly irregular, no m/r/g. No LE edema. Telemetry: Atrial fibrillation rate controlled Respiratory: CTA bilaterally, no w/r/r. Normal respiratory effort. Abdomen: soft, ntnd, positive suprapubic discomfort Skin: Brawny edema noted bilaterally in lower extremities Musculoskeletal: grossly normal tone BUE/BLE Psychiatric: Difficult to assess due to current altered mental status Neurologic: No facial asymmetry, moves extremities equally           Labs on Admission:  Basic Metabolic Panel:  Recent Labs Lab 01/28/14 1215  NA 140  K 4.8  CL 103  CO2 21  GLUCOSE 107*  BUN 47*  CREATININE 1.42*  CALCIUM 9.2   Liver Function Tests: No results found for this basename: AST, ALT, ALKPHOS, BILITOT, PROT, ALBUMIN,  in the last 168 hours No results found for this basename: LIPASE, AMYLASE,  in the last 168 hours No results found for this basename: AMMONIA,  in the last 168 hours CBC:  Recent Labs Lab 01/28/14 1215  WBC 6.8  NEUTROABS 5.2  HGB 13.0  HCT 39.0  MCV 92.0  PLT 180   Cardiac Enzymes:  Recent Labs Lab 01/28/14 1215  CKTOTAL 356*  TROPONINI <0.30    BNP (last 3 results)  Recent Labs  04/27/13 1733 11/14/13 1002  PROBNP 2160.0* 1616.0*   CBG: No results found for this basename: GLUCAP,  in the last 168 hours  Radiological Exams on Admission: Dg Chest 1 View  01/28/2014   CLINICAL DATA:  Altered mental status, history hypertension, MI, diabetes, CHF, former smoker  EXAM: CHEST - 1 VIEW  COMPARISON:  11/17/2013  FINDINGS: Enlargement of cardiac silhouette.  Atherosclerotic calcification aorta.  Pulmonary vascular congestion.  Loss of the silhouette of the lateral left diaphragm raising question of subtle left basilar infiltrate.  Remaining lungs clear.  Left glenohumeral  degenerative changes.  Mild scattered degenerative disc disease changes thoracic spine.  IMPRESSION: Enlargement of cardiac silhouette with pulmonary vascular congestion.  Question left basilar infiltrate laterally.   Electronically Signed   By: Lavonia Dana M.D.   On: 01/28/2014 13:27   Dg Hip Complete Right  01/28/2014   CLINICAL DATA:  Altered mental status, unable to rotate or straighten legs  EXAM: RIGHT HIP - COMPLETE 2+ VIEW  COMPARISON:  None  FINDINGS: Diffuse osseous demineralization and  Bilateral hip joint space narrowing.  SI joints preserved.  No acute fracture, dislocation, or bone destruction.  Scattered surgical clips in pelvis and lower abdomen.  IMPRESSION: Degenerative changes of both  hip joints.  Osseous demineralization.  No acute abnormalities.   Electronically Signed   By: Lavonia Dana M.D.   On: 01/28/2014 13:28   Ct Head Wo Contrast  01/28/2014   CLINICAL DATA:  Found down, slurred speech, altered mental status  EXAM: CT HEAD WITHOUT CONTRAST  CT CERVICAL SPINE WITHOUT CONTRAST  TECHNIQUE: Multidetector CT imaging of the head and cervical spine was performed following the standard protocol without intravenous contrast. Multiplanar CT image reconstructions of the cervical spine were also generated.  COMPARISON:  CT head dated 09/19/2005  FINDINGS: CT HEAD FINDINGS  No evidence of parenchymal hemorrhage or extra-axial fluid collection. No mass lesion, mass effect, or midline shift.  No CT evidence of acute infarction.  Mild subcortical white matter and periventricular small vessel ischemic changes. Intracranial atherosclerosis.  Cerebral volume is age appropriate.  The visualized paranasal sinuses are essentially clear. The mastoid air cells are unopacified.  No evidence of calvarial fracture.  CT CERVICAL SPINE FINDINGS  Motion degraded images.  Normal cervical lordosis.  No evidence of fracture or dislocation. Vertebral body heights are maintained. Dens appears intact.  No prevertebral  soft tissue swelling.  Mild to moderate degenerative changes, most prominent at C5-6 and C6-7.  Visualized thyroid is unremarkable.  Visualized lung apices are grossly clear.  IMPRESSION: No evidence of acute intracranial abnormality. Mild small vessel ischemic changes.  No evidence of traumatic injury to the cervical spine. Mild moderate degenerative changes.   Electronically Signed   By: Julian Hy M.D.   On: 01/28/2014 13:23   Ct Cervical Spine Wo Contrast  01/28/2014   CLINICAL DATA:  Found down, slurred speech, altered mental status  EXAM: CT HEAD WITHOUT CONTRAST  CT CERVICAL SPINE WITHOUT CONTRAST  TECHNIQUE: Multidetector CT imaging of the head and cervical spine was performed following the standard protocol without intravenous contrast. Multiplanar CT image reconstructions of the cervical spine were also generated.  COMPARISON:  CT head dated 09/19/2005  FINDINGS: CT HEAD FINDINGS  No evidence of parenchymal hemorrhage or extra-axial fluid collection. No mass lesion, mass effect, or midline shift.  No CT evidence of acute infarction.  Mild subcortical white matter and periventricular small vessel ischemic changes. Intracranial atherosclerosis.  Cerebral volume is age appropriate.  The visualized paranasal sinuses are essentially clear. The mastoid air cells are unopacified.  No evidence of calvarial fracture.  CT CERVICAL SPINE FINDINGS  Motion degraded images.  Normal cervical lordosis.  No evidence of fracture or dislocation. Vertebral body heights are maintained. Dens appears intact.  No prevertebral soft tissue swelling.  Mild to moderate degenerative changes, most prominent at C5-6 and C6-7.  Visualized thyroid is unremarkable.  Visualized lung apices are grossly clear.  IMPRESSION: No evidence of acute intracranial abnormality. Mild small vessel ischemic changes.  No evidence of traumatic injury to the cervical spine. Mild moderate degenerative changes.   Electronically Signed   By: Julian Hy M.D.   On: 01/28/2014 13:23    EKG: Independently reviewed. Atrial fibrillation with no ST elevations or depressions  Assessment/Plan As above problem: Metabolic encephalopathy -Most likely secondary to infectious etiology UTI or cellulitis or combination of both. - Will cover with IV antibiotics as listed below - Bed rest with physical therapy evaluation - CT of head report no evidence of acute intracranial abnormality and no evidence of traumatic injury to the cervical spine - If no improvement in mentation with IV antibiotics would consider further workup for example with vitamin B12, folate, RPR, HIV  Active Problems:   UTI (lower urinary tract infection) - Will cover with Rocephin and await urine culture results  Cellulitis - Cellulitis versus brawny edema - At this juncture will cover with vancomycin  CKD - Will continue to monitor serum creatinine daily  Anxiety - Will hold benzodiazepines in lieu of principal problem, but should patient develop withdrawal symptoms may have to low-dose  Type II DM - Hold oral hypoglycemic agents and place on sliding scale insulin sensitive scale  PAF - Continue home regimen - Will continue Cardizem for rate control and aspirin. Given falls patient in my opinion not candidate for Coumadin.  CHF - Chronic, currently compensated we'll continue home regimen.  Code Status: full Family Communication: No family at bedside. Disposition Plan: Pending improvement in mentation back to baseline  Time spent: > 60 minutes  Velvet Bathe [facility] Pager [phone]

## 2014-01-28 NOTE — ED Notes (Signed)
Patient transported to CT 

## 2014-01-28 NOTE — ED Notes (Addendum)
PT reports pain to upper RT thigh ,medial.

## 2014-01-28 NOTE — ED Provider Notes (Signed)
CSN: [id number]     Arrival date & time 01/28/14  1016 History   First MD Initiated Contact with Patient 01/28/14 1029     Chief Complaint  Patient presents with  . Fall  . Altered Mental Status    Level V Caveat: AMS  HPI Patient was brought to the emergency department after the husband found her lying next to the bed this morning. He reports increasing confusion in the pt over the past several days. No n/v/d described. No reported fevers. Recent hospitalization in Jan 2015 for sepsis with cardiac arrest. Required post hospital nursing home placement. Has been at home with home health over the past several weeks with husband. No recent falls. Primarily bed bound with some wheel chair. DM and atrial fibrillation. Reported compliance with medications.    Past Medical History  Diagnosis Date  . Hypertension   . Atrial fibrillation   . MI (myocardial infarction)   . Anxiety   . Hypercholesterolemia   . Shoulder pain   . Sleep apnea     intolerant to CPAP  . Type 2 diabetes mellitus   . OSA (obstructive sleep apnea)   . Hypertensive heart disease   . Morbid obesity   . Pyogenic granuloma   . Cancer     cervical  . Depression   . Pneumonia   . CHF (congestive heart failure)   . Cardiac arrest 11/14/2013  . Complete heart block 11/14/2013  . OSTEOMYELITIS 04/28/2010    L great Toe      . Carcinoma of endometrium 11/20/2013   Past Surgical History  Procedure Laterality Date  . Knee surgery      at age 72  . Cataract surgery      both eyes  . Abdominal hysterectomy    . Toe amputation     Family History  Problem Relation Age of Onset  . Emphysema Father   . Cancer Mother     intestinal  . Cancer Father    History  Substance Use Topics  . Smoking status: Former Smoker -- 1.00 packs/day for 20 years    Types: Cigarettes    Quit date: 11/09/1992  . Smokeless tobacco: Never Used  . Alcohol Use: No   OB History   Grav Para Term Preterm Abortions TAB SAB Ect Mult Living                  Review of Systems  Unable to perform ROS: Mental status change      Allergies  Review of patient's allergies indicates no known allergies.  Home Medications   Current Outpatient Rx  Name  Route  Sig  Dispense  Refill  . acetaminophen (TYLENOL) 500 MG tablet   Oral   Take 1,000 mg by mouth 2 (two) times daily.         Marland Kitchen ALPRAZolam (XANAX) 0.25 MG tablet   Oral   Take 0.25-0.5 mg by mouth 3 (three) times daily as needed for anxiety. Take one tablet by mouth every 12 hours as needed for anxiety         . aspirin EC 81 MG EC tablet   Oral   Take 1 tablet (81 mg total) by mouth daily.   30 tablet   3   . Cholecalciferol (VITAMIN D) 1000 UNITS capsule   Oral   Take 1,000 Units by mouth daily.           Marland Kitchen diltiazem (CARDIZEM) 30 MG tablet   Oral  Take 30 mg by mouth 3 (three) times daily.          . furosemide (LASIX) 20 MG tablet   Oral   Take 2 tablets (40 mg total) by mouth daily.   30 tablet   3   . glimepiride (AMARYL) 2 MG tablet   Oral   Take 2 mg by mouth daily with breakfast.         . metformin (FORTAMET) 500 MG (OSM) 24 hr tablet   Oral   Take 500 mg by mouth daily with breakfast.         . oxyCODONE (OXY IR/ROXICODONE) 5 MG immediate release tablet   Oral   Take 1 tablet (5 mg total) by mouth every 6 (six) hours as needed for severe pain.   20 tablet   0   . sertraline (ZOLOFT) 100 MG tablet   Oral   Take 1 tablet (100 mg total) by mouth at bedtime.   30 tablet   0   . spironolactone (ALDACTONE) 25 MG tablet   Oral   Take 25 mg by mouth daily.          BP 97/60  Pulse 100  Temp(Src) 97.6 F (36.4 C) (Oral)  Resp 19  SpO2 94% Physical Exam  Nursing note and vitals reviewed. Constitutional: She is oriented to person, place, and time. She appears well-developed and well-nourished. No distress.  HENT:  Head: Normocephalic and atraumatic.  Eyes: EOM are normal.  Neck: Normal range of motion.   Cardiovascular: Normal rate, regular rhythm and normal heart sounds.   Pulmonary/Chest: Effort normal and breath sounds normal.  Abdominal: Soft. She exhibits no distension. There is no tenderness.  Musculoskeletal: Normal range of motion.  Neurological: She is alert and oriented to person, place, and time.  Skin: Skin is warm and dry.  Psychiatric: She has a normal mood and affect. Judgment normal.    ED Course  Procedures (including critical care time) Labs Review Labs Reviewed  CBC WITH DIFFERENTIAL - Abnormal; Notable for the following:    Lymphocytes Relative 11 (*)    Monocytes Relative 13 (*)    All other components within normal limits  BASIC METABOLIC PANEL - Abnormal; Notable for the following:    Glucose, Bld 107 (*)    BUN 47 (*)    Creatinine, Ser 1.42 (*)    GFR calc non Af Amer 35 (*)    GFR calc Af Amer 40 (*)    All other components within normal limits  URINALYSIS, ROUTINE W REFLEX MICROSCOPIC - Abnormal; Notable for the following:    APPearance CLOUDY (*)    Hgb urine dipstick LARGE (*)    Nitrite POSITIVE (*)    Leukocytes, UA LARGE (*)    All other components within normal limits  URINE MICROSCOPIC-ADD ON - Abnormal; Notable for the following:    Bacteria, UA MANY (*)    All other components within normal limits  URINE CULTURE  CULTURE, BLOOD (ROUTINE X 2)  CULTURE, BLOOD (ROUTINE X 2)  LACTIC ACID, PLASMA  TROPONIN I  CK   Imaging Review Dg Chest 1 View  01/28/2014   CLINICAL DATA:  Altered mental status, history hypertension, MI, diabetes, CHF, former smoker  EXAM: CHEST - 1 VIEW  COMPARISON:  11/17/2013  FINDINGS: Enlargement of cardiac silhouette.  Atherosclerotic calcification aorta.  Pulmonary vascular congestion.  Loss of the silhouette of the lateral left diaphragm raising question of subtle left basilar infiltrate.  Remaining lungs  clear.  Left glenohumeral degenerative changes.  Mild scattered degenerative disc disease changes thoracic spine.   IMPRESSION: Enlargement of cardiac silhouette with pulmonary vascular congestion.  Question left basilar infiltrate laterally.   Electronically Signed   By: Lavonia Dana M.D.   On: 01/28/2014 13:27   Dg Hip Complete Right  01/28/2014   CLINICAL DATA:  Altered mental status, unable to rotate or straighten legs  EXAM: RIGHT HIP - COMPLETE 2+ VIEW  COMPARISON:  None  FINDINGS: Diffuse osseous demineralization and  Bilateral hip joint space narrowing.  SI joints preserved.  No acute fracture, dislocation, or bone destruction.  Scattered surgical clips in pelvis and lower abdomen.  IMPRESSION: Degenerative changes of both hip joints.  Osseous demineralization.  No acute abnormalities.   Electronically Signed   By: Lavonia Dana M.D.   On: 01/28/2014 13:28   Ct Head Wo Contrast  01/28/2014   CLINICAL DATA:  Found down, slurred speech, altered mental status  EXAM: CT HEAD WITHOUT CONTRAST  CT CERVICAL SPINE WITHOUT CONTRAST  TECHNIQUE: Multidetector CT imaging of the head and cervical spine was performed following the standard protocol without intravenous contrast. Multiplanar CT image reconstructions of the cervical spine were also generated.  COMPARISON:  CT head dated 09/19/2005  FINDINGS: CT HEAD FINDINGS  No evidence of parenchymal hemorrhage or extra-axial fluid collection. No mass lesion, mass effect, or midline shift.  No CT evidence of acute infarction.  Mild subcortical white matter and periventricular small vessel ischemic changes. Intracranial atherosclerosis.  Cerebral volume is age appropriate.  The visualized paranasal sinuses are essentially clear. The mastoid air cells are unopacified.  No evidence of calvarial fracture.  CT CERVICAL SPINE FINDINGS  Motion degraded images.  Normal cervical lordosis.  No evidence of fracture or dislocation. Vertebral body heights are maintained. Dens appears intact.  No prevertebral soft tissue swelling.  Mild to moderate degenerative changes, most prominent at C5-6 and  C6-7.  Visualized thyroid is unremarkable.  Visualized lung apices are grossly clear.  IMPRESSION: No evidence of acute intracranial abnormality. Mild small vessel ischemic changes.  No evidence of traumatic injury to the cervical spine. Mild moderate degenerative changes.   Electronically Signed   By: Julian Hy M.D.   On: 01/28/2014 13:23   Ct Cervical Spine Wo Contrast  01/28/2014   CLINICAL DATA:  Found down, slurred speech, altered mental status  EXAM: CT HEAD WITHOUT CONTRAST  CT CERVICAL SPINE WITHOUT CONTRAST  TECHNIQUE: Multidetector CT imaging of the head and cervical spine was performed following the standard protocol without intravenous contrast. Multiplanar CT image reconstructions of the cervical spine were also generated.  COMPARISON:  CT head dated 09/19/2005  FINDINGS: CT HEAD FINDINGS  No evidence of parenchymal hemorrhage or extra-axial fluid collection. No mass lesion, mass effect, or midline shift.  No CT evidence of acute infarction.  Mild subcortical white matter and periventricular small vessel ischemic changes. Intracranial atherosclerosis.  Cerebral volume is age appropriate.  The visualized paranasal sinuses are essentially clear. The mastoid air cells are unopacified.  No evidence of calvarial fracture.  CT CERVICAL SPINE FINDINGS  Motion degraded images.  Normal cervical lordosis.  No evidence of fracture or dislocation. Vertebral body heights are maintained. Dens appears intact.  No prevertebral soft tissue swelling.  Mild to moderate degenerative changes, most prominent at C5-6 and C6-7.  Visualized thyroid is unremarkable.  Visualized lung apices are grossly clear.  IMPRESSION: No evidence of acute intracranial abnormality. Mild small vessel ischemic changes.  No  evidence of traumatic injury to the cervical spine. Mild moderate degenerative changes.   Electronically Signed   By: Julian Hy M.D.   On: 01/28/2014 13:23  I personally reviewed the imaging tests through  PACS system I reviewed available ER/hospitalization records through the EMR    EKG Interpretation   Date/Time:  Sunday January 28 2014 10:22:48 EDT Ventricular Rate:  106 PR Interval:    QRS Duration: 115 QT Interval:  386 QTC Calculation: 513 R Axis:   82 Text Interpretation:  Atrial fibrillation Incomplete right bundle branch  block Low voltage, extremity and precordial leads Probable anteroseptal  infarct, old Nonspecific T abnormalities, lateral leads No significant  change was found Confirmed by Ida Uppal  MD, Garris Melhorn ([id number]) on 01/28/2014  11:24:54 AM      MDM   Final diagnoses:  Urinary tract infection  Altered mental status    Altered mental status. Will tx cellulitis and admit to hospital for additional workup. Doubt PNA.     Hoy Morn, MD 01/30/14 [id number]

## 2014-01-28 NOTE — ED Notes (Signed)
During Pt bedside assessment skin to buttocks appeared red and multiple areas of broken skin. Pt incont. Of urine and stool .

## 2014-01-29 LAB — CBC
HEMATOCRIT: 37.2 % (ref 36.0–46.0)
HEMOGLOBIN: 12.3 g/dL (ref 12.0–15.0)
MCH: 30.7 pg (ref 26.0–34.0)
MCHC: 33.1 g/dL (ref 30.0–36.0)
MCV: 92.8 fL (ref 78.0–100.0)
Platelets: 156 10*3/uL (ref 150–400)
RBC: 4.01 MIL/uL (ref 3.87–5.11)
RDW: 15.8 % — ABNORMAL HIGH (ref 11.5–15.5)
WBC: 4.4 10*3/uL (ref 4.0–10.5)

## 2014-01-29 LAB — BASIC METABOLIC PANEL
BUN: 43 mg/dL — ABNORMAL HIGH (ref 6–23)
CHLORIDE: 108 meq/L (ref 96–112)
CO2: 23 mEq/L (ref 19–32)
Calcium: 8.7 mg/dL (ref 8.4–10.5)
Creatinine, Ser: 1.28 mg/dL — ABNORMAL HIGH (ref 0.50–1.10)
GFR, EST AFRICAN AMERICAN: 46 mL/min — AB (ref >90)
GFR, EST NON AFRICAN AMERICAN: 39 mL/min — AB (ref >90)
Glucose, Bld: 70 mg/dL (ref 70–99)
POTASSIUM: 4.3 meq/L (ref 3.7–5.3)
SODIUM: 144 meq/L (ref 137–147)

## 2014-01-29 LAB — TSH: TSH: 1.634 u[IU]/mL (ref 0.350–4.500)

## 2014-01-29 LAB — GLUCOSE, CAPILLARY
GLUCOSE-CAPILLARY: 72 mg/dL (ref 70–99)
Glucose-Capillary: 95 mg/dL (ref 70–99)
Glucose-Capillary: 75 mg/dL (ref 70–99)
Glucose-Capillary: 136 mg/dL — ABNORMAL HIGH (ref 70–99)

## 2014-01-29 MED ORDER — GLUCERNA SHAKE PO LIQD
237.0000 mL | Freq: Three times a day (TID) | ORAL | Status: DC
  Administered 2014-01-29 – 2014-02-01 (×6): 237 mL via ORAL
  Filled 2014-01-29: qty 237

## 2014-01-29 NOTE — Progress Notes (Signed)
[facility] PROGRESS NOTE  Tara Huff [id number] DOB: 1934-04-17 DOA: 01/28/2014 PCP: Tara Naas, MD  Assessment/Plan: As above problem:   Metabolic encephalopathy  -Most likely secondary to infectious etiology UTI or cellulitis or combination of both.  - Improving with IV antibiotics as listed below  - Physical therapy evaluation and fall precautions. - CT of head report no evidence of acute intracranial abnormality and no evidence of traumatic injury to the cervical spine   Active Problems:   UTI (lower urinary tract infection)  - Will cover with Rocephin and await urine culture results   Cellulitis  - Cellulitis versus brawny edema  - At this juncture will cover with vancomycin   CKD  - Will continue to monitor serum creatinine daily   Anxiety  - Will hold benzodiazepines  Type II DM  - Hold oral hypoglycemic agents and place on sliding scale insulin sensitive scale   PAF  - Continue home regimen  - Will continue Cardizem for rate control and aspirin. Given falls patient in my opinion not candidate for Coumadin.   CHF  - Chronic, currently compensated we'll continue home regimen.  Code Status: full Family Communication: no family at bedside Disposition Plan: Pending continued improvement in mental status   Consultants:  none  Procedures:  none  Antibiotics:  Rocephin  Vancomycin  HPI/Subjective: No new complaints.  Objective: Filed Vitals:   01/29/14 1300  BP: 99/58  Pulse: 93  Temp: 97.3 F (36.3 C)  Resp: 18    Intake/Output Summary (Last 24 hours) at 01/29/14 1508 Last data filed at 01/29/14 0500  Gross per 24 hour  Intake      0 ml  Output   1250 ml  Net  -1250 ml   Filed Weights   01/28/14 1521  Weight: 120.203 kg (265 lb)    Exam:   General:  Pt is A and O x 2 to person and place but not time  Cardiovascular: RRR, no MRG  Respiratory: CTA BL, no wheezes  Abdomen: soft, NT  ,ND  Musculoskeletal: no cyanosis or clubbing   Data Reviewed: Basic Metabolic Panel:  Recent Labs Lab 01/28/14 1215 01/29/14 0521  NA 140 144  K 4.8 4.3  CL 103 108  CO2 21 23  GLUCOSE 107* 70  BUN 47* 43*  CREATININE 1.42* 1.28*  CALCIUM 9.2 8.7   Liver Function Tests: No results found for this basename: AST, ALT, ALKPHOS, BILITOT, PROT, ALBUMIN,  in the last 168 hours No results found for this basename: LIPASE, AMYLASE,  in the last 168 hours No results found for this basename: AMMONIA,  in the last 168 hours CBC:  Recent Labs Lab 01/28/14 1215 01/29/14 0521  WBC 6.8 4.4  NEUTROABS 5.2  --   HGB 13.0 12.3  HCT 39.0 37.2  MCV 92.0 92.8  PLT 180 156   Cardiac Enzymes:  Recent Labs Lab 01/28/14 1215  CKTOTAL 356*  TROPONINI <0.30   BNP (last 3 results)  Recent Labs  04/27/13 1733 11/14/13 1002  PROBNP 2160.0* 1616.0*   CBG:  Recent Labs Lab 01/28/14 2158 01/29/14 0724 01/29/14 1123  GLUCAP 102* 72 95    Recent Results (from the past 240 hour(s))  URINE CULTURE     Status: None   Collection Time    01/28/14 11:10 AM      Result Value Ref Range Status   Specimen Description URINE, CLEAN CATCH   Final   Special Requests NONE   Final  Culture  Setup Time     Final   Value: 01/28/2014 19:45     Performed at [facility]     Final   Value: >=100,000 COLONIES/ML     Performed at [facility]   Culture     Final   Value: Tara Huff     Performed at [facility]   Report Status PENDING   Incomplete  CULTURE, BLOOD (ROUTINE X 2)     Status: None   Collection Time    01/28/14 11:12 AM      Result Value Ref Range Status   Specimen Description BLOOD RIGHT ARM   Final   Special Requests BOTTLES DRAWN AEROBIC ONLY 4CC   Final   Culture  Setup Time     Final   Value: 01/28/2014 19:17     Performed at [facility]   Culture     Final   Value:        BLOOD CULTURE RECEIVED NO GROWTH TO DATE  CULTURE WILL BE HELD FOR 5 DAYS BEFORE ISSUING A FINAL NEGATIVE REPORT     Performed at [facility]   Report Status PENDING   Incomplete  CULTURE, BLOOD (ROUTINE X 2)     Status: None   Collection Time    01/28/14 11:17 AM      Result Value Ref Range Status   Specimen Description BLOOD RIGHT WRIST   Final   Special Requests BOTTLES DRAWN AEROBIC AND ANAEROBIC [facility]   Final   Culture  Setup Time     Final   Value: 01/28/2014 19:17     Performed at [facility]   Culture     Final   Value:        BLOOD CULTURE RECEIVED NO GROWTH TO DATE CULTURE WILL BE HELD FOR 5 DAYS BEFORE ISSUING A FINAL NEGATIVE REPORT     Performed at [facility]   Report Status PENDING   Incomplete     Studies: Dg Chest 1 View  01/28/2014   CLINICAL DATA:  Altered mental status, history hypertension, MI, diabetes, CHF, former smoker  EXAM: CHEST - 1 VIEW  COMPARISON:  11/17/2013  FINDINGS: Enlargement of cardiac silhouette.  Atherosclerotic calcification aorta.  Pulmonary vascular congestion.  Loss of the silhouette of the lateral left diaphragm raising question of subtle left basilar infiltrate.  Remaining lungs clear.  Left glenohumeral degenerative changes.  Mild scattered degenerative disc disease changes thoracic spine.  IMPRESSION: Enlargement of cardiac silhouette with pulmonary vascular congestion.  Question left basilar infiltrate laterally.   Electronically Signed   By: Tara Dana M.D.   On: 01/28/2014 13:27   Dg Hip Complete Right  01/28/2014   CLINICAL DATA:  Altered mental status, unable to rotate or straighten legs  EXAM: RIGHT HIP - COMPLETE 2+ VIEW  COMPARISON:  None  FINDINGS: Diffuse osseous demineralization and  Bilateral hip joint space narrowing.  SI joints preserved.  No acute fracture, dislocation, or bone destruction.  Scattered surgical clips in pelvis and lower abdomen.  IMPRESSION: Degenerative changes of both hip joints.  Osseous demineralization.  No acute  abnormalities.   Electronically Signed   By: Tara Dana M.D.   On: 01/28/2014 13:28   Ct Head Wo Contrast  01/28/2014   CLINICAL DATA:  Found down, slurred speech, altered mental status  EXAM: CT HEAD WITHOUT CONTRAST  CT CERVICAL SPINE WITHOUT CONTRAST  TECHNIQUE: Multidetector CT imaging  of the head and cervical spine was performed following the standard protocol without intravenous contrast. Multiplanar CT image reconstructions of the cervical spine were also generated.  COMPARISON:  CT head dated 09/19/2005  FINDINGS: CT HEAD FINDINGS  No evidence of parenchymal hemorrhage or extra-axial fluid collection. No mass lesion, mass effect, or midline shift.  No CT evidence of acute infarction.  Mild subcortical white matter and periventricular small vessel ischemic changes. Intracranial atherosclerosis.  Cerebral volume is age appropriate.  The visualized paranasal sinuses are essentially clear. The mastoid air cells are unopacified.  No evidence of calvarial fracture.  CT CERVICAL SPINE FINDINGS  Motion degraded images.  Normal cervical lordosis.  No evidence of fracture or dislocation. Vertebral body heights are maintained. Dens appears intact.  No prevertebral soft tissue swelling.  Mild to moderate degenerative changes, most prominent at C5-6 and C6-7.  Visualized thyroid is unremarkable.  Visualized lung apices are grossly clear.  IMPRESSION: No evidence of acute intracranial abnormality. Mild small vessel ischemic changes.  No evidence of traumatic injury to the cervical spine. Mild moderate degenerative changes.   Electronically Signed   By: Julian Hy M.D.   On: 01/28/2014 13:23   Ct Cervical Spine Wo Contrast  01/28/2014   CLINICAL DATA:  Found down, slurred speech, altered mental status  EXAM: CT HEAD WITHOUT CONTRAST  CT CERVICAL SPINE WITHOUT CONTRAST  TECHNIQUE: Multidetector CT imaging of the head and cervical spine was performed following the standard protocol without intravenous contrast.  Multiplanar CT image reconstructions of the cervical spine were also generated.  COMPARISON:  CT head dated 09/19/2005  FINDINGS: CT HEAD FINDINGS  No evidence of parenchymal hemorrhage or extra-axial fluid collection. No mass lesion, mass effect, or midline shift.  No CT evidence of acute infarction.  Mild subcortical white matter and periventricular small vessel ischemic changes. Intracranial atherosclerosis.  Cerebral volume is age appropriate.  The visualized paranasal sinuses are essentially clear. The mastoid air cells are unopacified.  No evidence of calvarial fracture.  CT CERVICAL SPINE FINDINGS  Motion degraded images.  Normal cervical lordosis.  No evidence of fracture or dislocation. Vertebral body heights are maintained. Dens appears intact.  No prevertebral soft tissue swelling.  Mild to moderate degenerative changes, most prominent at C5-6 and C6-7.  Visualized thyroid is unremarkable.  Visualized lung apices are grossly clear.  IMPRESSION: No evidence of acute intracranial abnormality. Mild small vessel ischemic changes.  No evidence of traumatic injury to the cervical spine. Mild moderate degenerative changes.   Electronically Signed   By: Julian Hy M.D.   On: 01/28/2014 13:23    Scheduled Meds: . acetaminophen  1,000 mg Oral BID  . aspirin EC  81 mg Oral Daily  . cefTRIAXone (ROCEPHIN)  IV  1 g Intravenous Q24H  . diltiazem  30 mg Oral TID  . furosemide  40 mg Oral Daily  . heparin  5,000 Units Subcutaneous 3 times per day  . insulin aspart  0-9 Units Subcutaneous TID WC  . sertraline  100 mg Oral QHS  . spironolactone  25 mg Oral Daily  . vancomycin  1,500 mg Intravenous Q24H   Continuous Infusions: . sodium chloride 1,000 mL (01/28/14 1329)  . sodium chloride 75 mL/hr at 01/29/14 1340    Active Problems:   ANXIETY   CKD (chronic kidney disease), stage III   Type II or unspecified type diabetes mellitus with peripheral circulatory disorders, uncontrolled(250.72)    Paroxysmal a-fib   Congestive heart failure, unspecified  UTI (lower urinary tract infection)   Metabolic encephalopathy    Time spent: > 35 minutes    Velvet Bathe  [facility] Pager [phone] If 7PM-7AM, please contact night-coverage at [email or web address], password [facility] 01/29/2014, 3:08 PM  LOS: 1 day
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
## 2014-01-29 NOTE — Clinical Documentation Improvement (Signed)
PLEASE SPECIFY TYPE AND ACUITY OF CHF: Possible Clinical Conditions?  Chronic Systolic Congestive Heart Failure Chronic Diastolic Congestive Heart Failure Chronic Systolic & Diastolic Congestive Heart Failure Acute Systolic Congestive Heart Failure Acute Diastolic Congestive Heart Failure Acute Systolic & Diastolic Congestive Heart Failure Acute on Chronic Systolic Congestive Heart Failure Acute on Chronic Diastolic Congestive Heart Failure Acute on Chronic Systolic & Diastolic Congestive Heart Failure Other Condition Cannot Clinically Determine  Supportive Information: "CHF - Chronic, currently compensated we'll continue home regimen"  Thank You, Alessandra Grout, RN, BSN, CCDS, Clinical Documentation Specialist:  [phone]   Cell=[phone] [place]- Health Information Management

## 2014-01-29 NOTE — Progress Notes (Signed)
INITIAL NUTRITION ASSESSMENT  DOCUMENTATION CODES Per approved criteria  -Morbid Obesity   INTERVENTION:  Glucerna Shake PO TID, each supplement provides 220 kcal and 10 grams of protein  NUTRITION DIAGNOSIS: Inadequate oral intake related to poor appetite as evidenced by poor intake of meals.   Goal: Intake to meet >90% of estimated nutrition needs.  Monitor:  PO intake, labs, weight trend.  Reason for Assessment: Low [place]  80 y.o. female  Admitting Dx: Altered mental status  ASSESSMENT: Patient is a 80 y.o. female with history of paroxysmal atrial fibrillation on aspirin, recent fall at home, hypertension, and anxiety on Xanax who presented to the ED on 3/22 with chief complaint of altered mental status. CT of head revealed no acute abnormalities. Urinalysis was indicative of active UTI.  Patient working with OT during [place] visit. She c/o pain in her legs. Briefly discussed patient's appetite and intake. She reports that she has been eating very poorly and has no appetite.   Height: Ht Readings from Last 1 Encounters:  01/29/14 5\' 8"  (1.727 m)    Weight: Wt Readings from Last 1 Encounters:  01/28/14 265 lb (120.203 kg)    Ideal Body Weight: 63.6 kg  % Ideal Body Weight: 189%  Wt Readings from Last 10 Encounters:  01/28/14 265 lb (120.203 kg)  01/15/14 265 lb (120.203 kg)  01/11/14 268 lb 3.2 oz (121.655 kg)  01/03/14 259 lb 3.2 oz (117.572 kg)  12/29/13 274 lb 6.4 oz (124.467 kg)  12/25/13 282 lb 6.4 oz (128.096 kg)  12/22/13 279 lb (126.554 kg)  12/18/13 274 lb (124.286 kg)  12/15/13 260 lb (117.935 kg)  12/13/13 258 lb (117.028 kg)    Usual Body Weight: ~260-280 lb  % Usual Body Weight: 100%  BMI:  Body mass index is 40.3 kg/(m^2). class 3, extreme/morbid obesity  Estimated Nutritional Needs: Kcal: 1800-2000 Protein: 115-130 gm Fluid: 2 L  Skin: pressure ulcer to buttocks; skin breakdown to inner thighs, buttocks, and legs  Diet Order: Carb  Control  EDUCATION NEEDS: -Education not appropriate at this time   Intake/Output Summary (Last 24 hours) at 01/29/14 1507 Last data filed at 01/29/14 0500  Gross per 24 hour  Intake      0 ml  Output   1250 ml  Net  -1250 ml    Last BM: 3/22   Labs:   Recent Labs Lab 01/28/14 1215 01/29/14 0521  NA 140 144  K 4.8 4.3  CL 103 108  CO2 21 23  BUN 47* 43*  CREATININE 1.42* 1.28*  CALCIUM 9.2 8.7  GLUCOSE 107* 70    CBG (last 3)   Recent Labs  01/28/14 2158 01/29/14 0724 01/29/14 1123  GLUCAP 102* 72 95    Scheduled Meds: . acetaminophen  1,000 mg Oral BID  . aspirin EC  81 mg Oral Daily  . cefTRIAXone (ROCEPHIN)  IV  1 g Intravenous Q24H  . diltiazem  30 mg Oral TID  . furosemide  40 mg Oral Daily  . heparin  5,000 Units Subcutaneous 3 times per day  . insulin aspart  0-9 Units Subcutaneous TID WC  . sertraline  100 mg Oral QHS  . spironolactone  25 mg Oral Daily  . vancomycin  1,500 mg Intravenous Q24H    Continuous Infusions: . sodium chloride 1,000 mL (01/28/14 1329)  . sodium chloride 75 mL/hr at 01/29/14 1340    Past Medical History  Diagnosis Date  . Hypertension   . Atrial fibrillation   .  MI (myocardial infarction)   . Anxiety   . Hypercholesterolemia   . Shoulder pain   . Sleep apnea     intolerant to CPAP  . Type 2 diabetes mellitus   . OSA (obstructive sleep apnea)   . Hypertensive heart disease   . Morbid obesity   . Pyogenic granuloma   . Cancer     cervical  . Depression   . Pneumonia   . CHF (congestive heart failure)   . Cardiac arrest 11/14/2013  . Complete heart block 11/14/2013  . OSTEOMYELITIS 04/28/2010    L great Toe      . Carcinoma of endometrium 11/20/2013    Past Surgical History  Procedure Laterality Date  . Knee surgery      at age 10  . Cataract surgery      both eyes  . Abdominal hysterectomy    . Toe amputation       Molli Barrows, RD, LDN, [place] Pager [phone] After Hours Pager [phone]

## 2014-01-30 LAB — GLUCOSE, CAPILLARY
GLUCOSE-CAPILLARY: 111 mg/dL — AB (ref 70–99)
Glucose-Capillary: 106 mg/dL — ABNORMAL HIGH (ref 70–99)
Glucose-Capillary: 134 mg/dL — ABNORMAL HIGH (ref 70–99)
Glucose-Capillary: 110 mg/dL — ABNORMAL HIGH (ref 70–99)

## 2014-01-30 LAB — URINE CULTURE: Colony Count: >=100000
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
## 2014-01-30 NOTE — Progress Notes (Addendum)
Clinical Social Work Department CLINICAL SOCIAL WORK PLACEMENT NOTE 01/30/2014  Patient:  Tara Huff, Tara Huff  Account Number:  [id number] [place] date:  01/28/2014  Clinical Social Worker:  Berton Mount, Latanya Presser  Date/time:  01/30/2014 12:00 N  Clinical Social Work is seeking post-discharge placement for this patient at the following level of care:   SKILLED NURSING   (*CSW will update this form in Epic as items are completed)   01/30/2014  Patient/family provided with [facility] Department of Clinical Social Work's list of facilities offering this level of care within the geographic area requested by the patient (or if unable, by the patient's family).  01/30/2014  Patient/family informed of their freedom to choose among providers that offer the needed level of care, that participate in Medicare, Medicaid or managed care program needed by the patient, have an available bed and are willing to accept the patient.  01/30/2014  Patient/family informed of [facility]' ownership interest in [facility], as well as of the fact that they are under no obligation to receive care at this facility.  PASARR submitted to EDS on Existing PASARR number received from EDS on   FL2 transmitted to all facilities in geographic area requested by pt/family on  01/30/2014 FL2 transmitted to all facilities within larger geographic area on   Patient informed that his/her managed care company has contracts with or will negotiate with  certain facilities, including the following:     Patient/family informed of bed offers received:   01/31/2014 Patient chooses bed at [facility] Physician recommends and patient chooses bed at    Patient to be transferred to [facility] : [facility] on  02/01/2014 Patient to be transferred to facility by [facility]  The following physician request were entered in Epic:   Additional Comments:    [place], [place]
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
## 2014-01-31 DIAGNOSIS — R197 Diarrhea, unspecified: Secondary | ICD-10-CM

## 2014-01-31 DIAGNOSIS — N183 Chronic kidney disease, stage 3 unspecified: Secondary | ICD-10-CM

## 2014-01-31 DIAGNOSIS — G9340 Encephalopathy, unspecified: Secondary | ICD-10-CM

## 2014-01-31 LAB — CLOSTRIDIUM DIFFICILE BY PCR: CDIFFPCR: NEGATIVE

## 2014-01-31 LAB — GLUCOSE, CAPILLARY
GLUCOSE-CAPILLARY: 86 mg/dL (ref 70–99)
GLUCOSE-CAPILLARY: 109 mg/dL — AB (ref 70–99)
GLUCOSE-CAPILLARY: 135 mg/dL — AB (ref 70–99)
Glucose-Capillary: 108 mg/dL — ABNORMAL HIGH (ref 70–99)

## 2014-01-31 MED ORDER — CEFUROXIME AXETIL 250 MG PO TABS
250.0000 mg | ORAL_TABLET | Freq: Two times a day (BID) | ORAL | Status: DC
  Administered 2014-01-31 – 2014-02-01 (×2): 250 mg via ORAL
  Filled 2014-01-31 (×4): qty 1

## 2014-01-31 MED ORDER — SODIUM CHLORIDE 0.9 % IV BOLUS (SEPSIS)
500.0000 mL | Freq: Once | INTRAVENOUS | Status: AC
  Administered 2014-01-31: 500 mL via INTRAVENOUS

## 2014-01-31 NOTE — Progress Notes (Addendum)
CSW (Clinical Education officer, museum) called pt husband to give bed offers. CSW had to leave voicemail requesting return phone call.  ADDENDUM: CSW has spoken with pt husband and provided with bed offers. At this time, pt husband would like to have pt dc to [facility] by non-emergent ambulance today (if medically stable).   [place], [place]

## 2014-01-31 NOTE — Progress Notes (Signed)
[facility] PROGRESS NOTE  Tara Huff ERD:[id number] DOB: 1934/02/09 DOA: 01/28/2014 PCP: Reginia Naas, MD 80 year old Caucasian female that presented after a fall was found to have UTI but was brought in by her spouse secondary to altered mental status.  Assessment/Plan:    Metabolic encephalopathy  -Most likely secondary to infectious etiology UTI or cellulitis or combination of both.  - Improved - Physical therapist: Recommending skilled nursing facility - CT of head report no evidence of acute intracranial abnormality and no evidence of traumatic injury to the cervical spine   Active Problems:   Diarrhea -Patient developing multiple episodes of diarrhea overnight. -She has been on empiric IV antibiotic therapy for urinary tract infection -Will check a stool for C. difficile  UTI (lower urinary tract infection)  - Urine cultures drawn on 01/28/2014 growing Klebsiella, organism susceptible to cephalosporins -Will discontinue vancomycin and ceftriaxone -Start Ceftin 250 mg by mouth twice a day  Cellulitis  - Cellulitis versus brawny edema  - Discontinuing the IV antimicrobials, transitioning to oral Ceftin therapy  CKD  - Stable  Anxiety  - Will hold benzodiazepines  Type II DM  - Hold oral hypoglycemic agents and place on sliding scale insulin sensitive scale   PAF  - Continue home regimen  - Will continue Cardizem for rate control and aspirin. Given falls patient in my opinion not candidate for Coumadin.   CHF  - Chronic, currently compensated we'll continue home regimen.  Code Status: full Family Communication: no family at bedside Disposition Plan: Patient developing multiple episodes of diarrhea overnight. I ordered a stool for C. difficile. Could be antibiotic related. Hold discharge for now   Consultants:  none  Procedures:  none  Antibiotics:  Rocephin discontinued on 01/31/2014  Vancomycin discontinued on 01/31/2014  Ceftin  250 mg by mouth twice a day started on 01/31/2014   HPI/Subjective: Patient showed improvement however developing multiple episodes of liquid consistency diarrhea overnight. Stool for C. difficile has been ordered appear  Objective: Filed Vitals:   01/31/14 0510  BP: 122/78  Pulse: 95  Temp: 97.7 F (36.5 C)  Resp: 18    Intake/Output Summary (Last 24 hours) at 01/31/14 1052 Last data filed at 01/31/14 0511  Gross per 24 hour  Intake    360 ml  Output   2500 ml  Net  -2140 ml   Filed Weights   01/28/14 1521  Weight: 120.203 kg (265 lb)    Exam:   General:  Pt is A and O x 2 to person and place but not time  Cardiovascular: RRR, no MRG  Respiratory: CTA BL, no wheezes  Abdomen: soft, NT ,ND  Musculoskeletal: no cyanosis or clubbing   Data Reviewed: Basic Metabolic Panel:  Recent Labs Lab 01/28/14 1215 01/29/14 0521  NA 140 144  K 4.8 4.3  CL 103 108  CO2 21 23  GLUCOSE 107* 70  BUN 47* 43*  CREATININE 1.42* 1.28*  CALCIUM 9.2 8.7   Liver Function Tests: No results found for this basename: AST, ALT, ALKPHOS, BILITOT, PROT, ALBUMIN,  in the last 168 hours No results found for this basename: LIPASE, AMYLASE,  in the last 168 hours No results found for this basename: AMMONIA,  in the last 168 hours CBC:  Recent Labs Lab 01/28/14 1215 01/29/14 0521  WBC 6.8 4.4  NEUTROABS 5.2  --   HGB 13.0 12.3  HCT 39.0 37.2  MCV 92.0 92.8  PLT 180 156   Cardiac Enzymes:  Recent Labs  Lab 01/28/14 1215  CKTOTAL 356*  TROPONINI <0.30   BNP (last 3 results)  Recent Labs  04/27/13 1733 11/14/13 1002  PROBNP 2160.0* 1616.0*   CBG:  Recent Labs Lab 01/30/14 0647 01/30/14 1205 01/30/14 1648 01/30/14 2135 01/31/14 0621  GLUCAP 106* 134* 110* 111* 86    Recent Results (from the past 240 hour(s))  URINE CULTURE     Status: None   Collection Time    01/28/14 11:10 AM      Result Value Ref Range Status   Specimen Description URINE, CLEAN CATCH    Final   Special Requests NONE   Final   Culture  Setup Time     Final   Value: 01/28/2014 19:45     Performed at [place]     Final   Value: >=100,000 COLONIES/ML     Performed at [facility]   Culture     Final   Value: KLEBSIELLA PNEUMONIAE     Performed at [facility]   Report Status 01/30/2014 FINAL   Final   Organism ID, Bacteria KLEBSIELLA PNEUMONIAE   Final  CULTURE, BLOOD (ROUTINE X 2)     Status: None   Collection Time    01/28/14 11:12 AM      Result Value Ref Range Status   Specimen Description BLOOD RIGHT ARM   Final   Special Requests BOTTLES DRAWN AEROBIC ONLY 4CC   Final   Culture  Setup Time     Final   Value: 01/28/2014 19:17     Performed at [facility]   Culture     Final   Value:        BLOOD CULTURE RECEIVED NO GROWTH TO DATE CULTURE WILL BE HELD FOR 5 DAYS BEFORE ISSUING A FINAL NEGATIVE REPORT     Performed at [facility]   Report Status PENDING   Incomplete  CULTURE, BLOOD (ROUTINE X 2)     Status: None   Collection Time    01/28/14 11:17 AM      Result Value Ref Range Status   Specimen Description BLOOD RIGHT WRIST   Final   Special Requests BOTTLES DRAWN AEROBIC AND ANAEROBIC [facility]   Final   Culture  Setup Time     Final   Value: 01/28/2014 19:17     Performed at [facility]   Culture     Final   Value:        BLOOD CULTURE RECEIVED NO GROWTH TO DATE CULTURE WILL BE HELD FOR 5 DAYS BEFORE ISSUING A FINAL NEGATIVE REPORT     Performed at [facility]   Report Status PENDING   Incomplete     Studies: No results found.  Scheduled Meds: . acetaminophen  1,000 mg Oral BID  . aspirin EC  81 mg Oral Daily  . cefUROXime  250 mg Oral BID WC  . diltiazem  30 mg Oral TID  . feeding supplement (GLUCERNA SHAKE)  237 mL Oral TID BM  . furosemide  40 mg Oral Daily  . heparin  5,000 Units Subcutaneous 3 times per day  . insulin aspart  0-9 Units Subcutaneous TID WC  .  sertraline  100 mg Oral QHS  . sodium chloride  500 mL Intravenous Once  . spironolactone  25 mg Oral Daily   Continuous Infusions:    Active Problems:   ANXIETY   CKD (chronic kidney disease), stage III  Type II or unspecified type diabetes mellitus with peripheral circulatory disorders, uncontrolled(250.72)   Paroxysmal a-fib   Congestive heart failure, unspecified   UTI (lower urinary tract infection)   Metabolic encephalopathy    Time spent: > 35 minutes    Kelvin Cellar  [facility] Pager [phone] If 7PM-7AM, please contact night-coverage at [email or web address], password [facility] 01/31/2014, 10:52 AM  LOS: 3 days

## 2014-01-31 NOTE — Progress Notes (Signed)
Physical Therapy Treatment Patient Details Name: Tara Huff MRN: [id number] DOB: Feb 28, 1934 Today's Date: 01/31/2014    History of Present Illness Pt admitted to hospital after a fall and altered mental status. Dx with metabolic encephalopathy and CKD    PT Comments    Pt progressing slowly, was willing and able to transfer +2 Mod assist to bedside commode and into reclining chair today. Significant vital values noted below. Pt will benefit from continued PT in SNF upon d/c. Will continue to follow while in acute care.   Follow Up Recommendations  Supervision/Assistance - 24 hour;SNF     Equipment Recommendations  None recommended by PT    Recommendations for Other Services OT consult     Precautions / Restrictions Precautions Precautions: Fall Precaution Comments: RED SOCKS per MD request Restrictions Weight Bearing Restrictions: No Other Position/Activity Restrictions: Pt reports she does not walk    Mobility  Bed Mobility Overal bed mobility: Needs Assistance;+2 for physical assistance Bed Mobility: Supine to Sit     Supine to sit: HOB elevated;+2 for physical assistance;Mod assist     General bed mobility comments: Mod assist +2 for trunk control and to scoot hips forward. HOB elevated and rail used for assistance pt able to sit EOB with close guard.  Transfers Overall transfer level: Needs assistance Equipment used: 2 person hand held assist Transfers: Stand Pivot Transfers   Stand pivot transfers: Mod assist;+2 physical assistance;From elevated surface       General transfer comment: Mod assist +2 hand held assist with frequent verbal cues for stand/pivot transfer from bed>bsc and bsc>chair. Pt with loss of bowel control upon standing. Was able to tolerate 2 minute stand while second person performed perianal care. Cues for upright posture. Mod assist to maintain balance.  Ambulation/Gait                 Stairs            Wheelchair  Mobility    Modified Rankin (Stroke Patients Only)       Balance Overall balance assessment: Needs assistance;History of Falls         Standing balance support: Single extremity supported;Bilateral upper extremity supported Standing balance-Leahy Scale: Poor Standing balance comment: Pt needs 1-2 hands for assist in standing with hand held support and min-mod assist for balance at a times.                    Cognition Arousal/Alertness: Awake/alert Behavior During Therapy: WFL for tasks assessed/performed Overall Cognitive Status: Impaired/Different from baseline Area of Impairment: Memory;Problem solving;Following commands     Memory: Decreased short-term memory Following Commands: Follows one step commands consistently     Problem Solving: Difficulty sequencing;Requires verbal cues;Requires tactile cues      Exercises General Exercises - Lower Extremity Ankle Circles/Pumps: AROM;Both;15 reps;Supine Quad Sets: AROM;Both;10 reps;Supine Straight Leg Raises: AAROM;Both;10 reps;Supine    General Comments General comments (skin integrity, edema, etc.): bowel movement upon standing      Pertinent Vitals/Pain Pt states no pain HR observed while on BSC ranging from 86-130 constantly changing, via SpO2 sensor on dinamap; reports no symptoms. Pt repositioned in chair for comfort.    Home Living                      Prior Function            PT Goals (current goals can now be found in the care plan section) Acute Rehab PT  Goals PT Goal Formulation: With patient Time For Goal Achievement: 02/05/14 Potential to Achieve Goals: Fair Progress towards PT goals: Progressing toward goals    Frequency  Min 3X/week    PT Plan Current plan remains appropriate    End of Session Equipment Utilized During Treatment: Gait belt Activity Tolerance: Patient limited by fatigue Patient left: in chair;with call bell/phone within reach     Time: 6599-3570 PT  Time Calculation (min): 31 min  Charges:  $Therapeutic Exercise: 8-22 mins $Self Care/Home Management: 07/29/23                    G Codes:      Elayne Snare, [place]  Ellouise Newer 01/31/2014, 4:07 PM

## 2014-02-01 DIAGNOSIS — F411 Generalized anxiety disorder: Secondary | ICD-10-CM

## 2014-02-01 DIAGNOSIS — R5381 Other malaise: Secondary | ICD-10-CM

## 2014-02-01 DIAGNOSIS — E1159 Type 2 diabetes mellitus with other circulatory complications: Secondary | ICD-10-CM

## 2014-02-01 LAB — GLUCOSE, CAPILLARY
GLUCOSE-CAPILLARY: 89 mg/dL (ref 70–99)
GLUCOSE-CAPILLARY: 108 mg/dL — AB (ref 70–99)

## 2014-02-01 MED ORDER — DIPHENOXYLATE-ATROPINE 2.5-0.025 MG PO TABS: 1.0000 | ORAL_TABLET | Freq: Four times a day (QID) | ORAL | Status: DC | PRN

## 2014-02-01 MED ORDER — CYCLOBENZAPRINE HCL 5 MG PO TABS
5.0000 mg | ORAL_TABLET | Freq: Once | ORAL | Status: AC
  Administered 2014-02-01: 5 mg via ORAL
  Filled 2014-02-01: qty 1

## 2014-02-01 MED ORDER — CEFUROXIME AXETIL 250 MG PO TABS
250.0000 mg | ORAL_TABLET | Freq: Two times a day (BID) | ORAL | Status: DC
Start: 2014-02-01 — End: 2014-03-20

## 2014-02-01 MED ORDER — INSULIN ASPART 100 UNIT/ML ~~LOC~~ SOLN: 0.0000 [IU] | Freq: Three times a day (TID) | SUBCUTANEOUS | Status: DC

## 2014-02-01 MED ORDER — ALPRAZOLAM 0.25 MG PO TABS: 0.2500 mg | ORAL_TABLET | Freq: Two times a day (BID) | ORAL | Status: DC | PRN

## 2014-02-01 NOTE — Progress Notes (Signed)
Patient remains awake and restless after scheduled Tylenol and PRN oxycodone given at 2053.  Alert and oriented, fidgeting and legs/feet wiggling in bed.  On-call notified and order received for one time dose of flexeril.  Continue to monitor.

## 2014-02-01 NOTE — Progress Notes (Addendum)
[facility] phoned at [phone].  Report attempt x 3. Telephone # offered to staff for call back when available.

## 2014-02-01 NOTE — Progress Notes (Signed)
Discharge pending today for return to [facility]. Social Public house manager all documents needed for transfer to that facility.

## 2014-02-01 NOTE — Discharge Summary (Addendum)
Physician Discharge Summary  Tara Huff QIO:[id number] DOB: Mar 13, 1934 DOA: 01/28/2014  PCP: Reginia Naas, MD  Admit date: 01/28/2014 Discharge date: 02/01/2014  Time spent: 35 minutes  Recommendations for Outpatient Follow-up:  1. Please followup on blood sugars, Accu-Cheks Q. a.c. each bedtime 2. Follow up on patient's diarrhea, having stool negative for C. difficile  Discharge Diagnoses:  Principal Problem:   Metabolic encephalopathy Active Problems:   CKD (chronic kidney disease), stage III   UTI (lower urinary tract infection)   ANXIETY   Type II or unspecified type diabetes mellitus with peripheral circulatory disorders, uncontrolled(250.72)   Paroxysmal a-fib   Congestive heart failure, unspecified   Discharge Condition: Stable/improved  Diet recommendation: Heart healthy/carb consistent  Filed Weights   01/28/14 1521  Weight: 120.203 kg (265 lb)    History of present illness:  Tara Huff is a 80 y.o. female  With history of paroxysmal atrial fibrillation on aspirin, recent fall at home, hypertension, and anxiety on Xanax. Who presented to the ED with chief complaint of altered mental status. The patient was brought to the emergency department after the husband noticed that wife at home was altered mentally.  While in the ED patient had CT of head which reported no acute abnormalities. Urinalysis was indicative of active urinary tract infection and urine culture was sent. Cultures were also collected in the ED. Given persistent outward mental status we were consulted for further evaluation and recommendations.  Hospital Course:  Patient is a pleasant 80 year old female with a past medical history of hypertension, paroxysmal atrial fibrillation, who was admitted to the medicine service on 01/28/2014 presenting with acute encephalopathy. Patient having a steep functional decline, having a fall at home. Initial workup included a CT scan of brain which did not  reveal acute intracranial abnormalities. Urinalysis showed the presence of many bacteria along with the presence of leukocyte esterase and nitrates. She was started on empiric IV antibiotic therapy with ceftriaxone as acute encephalopathy was suspected to be secondary to underlying infectious etiology. Urine cultures drawn on 01/28/2014 growing Klebsiella, greater than 100,000 colony-forming units, susceptible to cephalosporins. She was transitioned to oral Ceftin therapy. During this hospitalization she had several episodes of diarrhea for which a stool was sent for C. difficile. Fortunately this came back negative. Given significant clinical improvement, patient was discharged in stable condition to a skilled nursing facility on 02/01/2014.   Consultations:  Physical therapy  Social work  Discharge Exam: Filed Vitals:   02/01/14 1015  BP: 115/79  Pulse:   Temp:   Resp:    General: Pt is A and O x 2 to person and place but not time  Cardiovascular: RRR, no MRG  Respiratory: CTA BL, no wheezes  Abdomen: soft, NT ,ND  Musculoskeletal: no cyanosis or clubbing, positive edema bilaterally, venous stasis changes noted    Discharge Instructions      Discharge Orders   Future Appointments Provider Department Dept Phone   02/20/2014 1:30 PM Sueanne Margarita, MD [facility] [phone]   Future Orders Complete By Expires   Call MD for:  difficulty breathing, headache or visual disturbances  As directed    Call MD for:  extreme fatigue  As directed    Call MD for:  persistant nausea and vomiting  As directed    Call MD for:  severe uncontrolled pain  As directed    Call MD for:  temperature >100.4  As directed    Diet - low sodium heart  healthy  As directed    Increase activity slowly  As directed        Medication List    STOP taking these medications       metformin 500 MG (OSM) 24 hr tablet  Commonly known as:  FORTAMET      TAKE these medications        acetaminophen 500 MG tablet  Commonly known as:  TYLENOL  Take 1,000 mg by mouth 2 (two) times daily.     ALPRAZolam 0.25 MG tablet  Commonly known as:  XANAX  Take 1-2 tablets (0.25-0.5 mg total) by mouth 2 (two) times daily as needed for anxiety. Take one tablet by mouth every 12 hours as needed for anxiety     aspirin 81 MG EC tablet  Take 1 tablet (81 mg total) by mouth daily.     cefUROXime 250 MG tablet  Commonly known as:  CEFTIN  Take 1 tablet (250 mg total) by mouth 2 (two) times daily with a meal.     diltiazem 30 MG tablet  Commonly known as:  CARDIZEM  Take 30 mg by mouth 3 (three) times daily.     diphenoxylate-atropine 2.5-0.025 MG per tablet  Commonly known as:  LOMOTIL  Take 1 tablet by mouth 4 (four) times daily as needed for diarrhea or loose stools.     furosemide 20 MG tablet  Commonly known as:  LASIX  Take 2 tablets (40 mg total) by mouth daily.     glimepiride 2 MG tablet  Commonly known as:  AMARYL  Take 2 mg by mouth daily with breakfast.     insulin aspart 100 UNIT/ML injection  Commonly known as:  novoLOG  Inject 0-9 Units into the skin 3 (three) times daily with meals.     oxyCODONE 5 MG immediate release tablet  Commonly known as:  Oxy IR/ROXICODONE  Take 1 tablet (5 mg total) by mouth every 6 (six) hours as needed for severe pain.     sertraline 100 MG tablet  Commonly known as:  ZOLOFT  Take 1 tablet (100 mg total) by mouth at bedtime.     spironolactone 25 MG tablet  Commonly known as:  ALDACTONE  Take 25 mg by mouth daily.     Vitamin D 1000 UNITS capsule  Take 1,000 Units by mouth daily.       No Known Allergies Follow-up Information   Follow up with Reginia Naas, MD In 1 week.   Specialty:  Family Medicine   Contact information:   [address] [place] [address] [phone]        The results of significant diagnostics from this hospitalization (including imaging, microbiology, ancillary  and laboratory) are listed below for reference.    Significant Diagnostic Studies: Dg Chest 1 View  01/28/2014   CLINICAL DATA:  Altered mental status, history hypertension, MI, diabetes, CHF, former smoker  EXAM: CHEST - 1 VIEW  COMPARISON:  11/17/2013  FINDINGS: Enlargement of cardiac silhouette.  Atherosclerotic calcification aorta.  Pulmonary vascular congestion.  Loss of the silhouette of the lateral left diaphragm raising question of subtle left basilar infiltrate.  Remaining lungs clear.  Left glenohumeral degenerative changes.  Mild scattered degenerative disc disease changes thoracic spine.  IMPRESSION: Enlargement of cardiac silhouette with pulmonary vascular congestion.  Question left basilar infiltrate laterally.   Electronically Signed   By: Lavonia Dana M.D.   On: 01/28/2014 13:27   Dg Hip Complete Right  01/28/2014  CLINICAL DATA:  Altered mental status, unable to rotate or straighten legs  EXAM: RIGHT HIP - COMPLETE 2+ VIEW  COMPARISON:  None  FINDINGS: Diffuse osseous demineralization and  Bilateral hip joint space narrowing.  SI joints preserved.  No acute fracture, dislocation, or bone destruction.  Scattered surgical clips in pelvis and lower abdomen.  IMPRESSION: Degenerative changes of both hip joints.  Osseous demineralization.  No acute abnormalities.   Electronically Signed   By: Lavonia Dana M.D.   On: 01/28/2014 13:28   Ct Head Wo Contrast  01/28/2014   CLINICAL DATA:  Found down, slurred speech, altered mental status  EXAM: CT HEAD WITHOUT CONTRAST  CT CERVICAL SPINE WITHOUT CONTRAST  TECHNIQUE: Multidetector CT imaging of the head and cervical spine was performed following the standard protocol without intravenous contrast. Multiplanar CT image reconstructions of the cervical spine were also generated.  COMPARISON:  CT head dated 09/19/2005  FINDINGS: CT HEAD FINDINGS  No evidence of parenchymal hemorrhage or extra-axial fluid collection. No mass lesion, mass effect, or midline  shift.  No CT evidence of acute infarction.  Mild subcortical white matter and periventricular small vessel ischemic changes. Intracranial atherosclerosis.  Cerebral volume is age appropriate.  The visualized paranasal sinuses are essentially clear. The mastoid air cells are unopacified.  No evidence of calvarial fracture.  CT CERVICAL SPINE FINDINGS  Motion degraded images.  Normal cervical lordosis.  No evidence of fracture or dislocation. Vertebral body heights are maintained. Dens appears intact.  No prevertebral soft tissue swelling.  Mild to moderate degenerative changes, most prominent at C5-6 and C6-7.  Visualized thyroid is unremarkable.  Visualized lung apices are grossly clear.  IMPRESSION: No evidence of acute intracranial abnormality. Mild small vessel ischemic changes.  No evidence of traumatic injury to the cervical spine. Mild moderate degenerative changes.   Electronically Signed   By: Julian Hy M.D.   On: 01/28/2014 13:23   Ct Cervical Spine Wo Contrast  01/28/2014   CLINICAL DATA:  Found down, slurred speech, altered mental status  EXAM: CT HEAD WITHOUT CONTRAST  CT CERVICAL SPINE WITHOUT CONTRAST  TECHNIQUE: Multidetector CT imaging of the head and cervical spine was performed following the standard protocol without intravenous contrast. Multiplanar CT image reconstructions of the cervical spine were also generated.  COMPARISON:  CT head dated 09/19/2005  FINDINGS: CT HEAD FINDINGS  No evidence of parenchymal hemorrhage or extra-axial fluid collection. No mass lesion, mass effect, or midline shift.  No CT evidence of acute infarction.  Mild subcortical white matter and periventricular small vessel ischemic changes. Intracranial atherosclerosis.  Cerebral volume is age appropriate.  The visualized paranasal sinuses are essentially clear. The mastoid air cells are unopacified.  No evidence of calvarial fracture.  CT CERVICAL SPINE FINDINGS  Motion degraded images.  Normal cervical  lordosis.  No evidence of fracture or dislocation. Vertebral body heights are maintained. Dens appears intact.  No prevertebral soft tissue swelling.  Mild to moderate degenerative changes, most prominent at C5-6 and C6-7.  Visualized thyroid is unremarkable.  Visualized lung apices are grossly clear.  IMPRESSION: No evidence of acute intracranial abnormality. Mild small vessel ischemic changes.  No evidence of traumatic injury to the cervical spine. Mild moderate degenerative changes.   Electronically Signed   By: Julian Hy M.D.   On: 01/28/2014 13:23    Microbiology: Recent Results (from the past 240 hour(s))  URINE CULTURE     Status: None   Collection Time    01/28/14 11:10  AM      Result Value Ref Range Status   Specimen Description URINE, CLEAN CATCH   Final   Special Requests NONE   Final   Culture  Setup Time     Final   Value: 01/28/2014 19:45     Performed at [place]     Final   Value: >=100,000 COLONIES/ML     Performed at [facility]   Culture     Final   Value: KLEBSIELLA PNEUMONIAE     Performed at [facility]   Report Status 01/30/2014 FINAL   Final   Organism ID, Bacteria KLEBSIELLA PNEUMONIAE   Final  CULTURE, BLOOD (ROUTINE X 2)     Status: None   Collection Time    01/28/14 11:12 AM      Result Value Ref Range Status   Specimen Description BLOOD RIGHT ARM   Final   Special Requests BOTTLES DRAWN AEROBIC ONLY 4CC   Final   Culture  Setup Time     Final   Value: 01/28/2014 19:17     Performed at [facility]   Culture     Final   Value:        BLOOD CULTURE RECEIVED NO GROWTH TO DATE CULTURE WILL BE HELD FOR 5 DAYS BEFORE ISSUING A FINAL NEGATIVE REPORT     Performed at [facility]   Report Status PENDING   Incomplete  CULTURE, BLOOD (ROUTINE X 2)     Status: None   Collection Time    01/28/14 11:17 AM      Result Value Ref Range Status   Specimen Description BLOOD RIGHT WRIST   Final    Special Requests BOTTLES DRAWN AEROBIC AND ANAEROBIC [place]   Final   Culture  Setup Time     Final   Value: 01/28/2014 19:17     Performed at [facility]   Culture     Final   Value:        BLOOD CULTURE RECEIVED NO GROWTH TO DATE CULTURE WILL BE HELD FOR 5 DAYS BEFORE ISSUING A FINAL NEGATIVE REPORT     Performed at [facility]   Report Status PENDING   Incomplete  CLOSTRIDIUM DIFFICILE BY PCR     Status: None   Collection Time    01/31/14  4:02 PM      Result Value Ref Range Status   C difficile by pcr NEGATIVE  NEGATIVE Final     Labs: Basic Metabolic Panel:  Recent Labs Lab 01/28/14 1215 01/29/14 0521  NA 140 144  K 4.8 4.3  CL 103 108  CO2 21 23  GLUCOSE 107* 70  BUN 47* 43*  CREATININE 1.42* 1.28*  CALCIUM 9.2 8.7   Liver Function Tests: No results found for this basename: AST, ALT, ALKPHOS, BILITOT, PROT, ALBUMIN,  in the last 168 hours No results found for this basename: LIPASE, AMYLASE,  in the last 168 hours No results found for this basename: AMMONIA,  in the last 168 hours CBC:  Recent Labs Lab 01/28/14 1215 01/29/14 0521  WBC 6.8 4.4  NEUTROABS 5.2  --   HGB 13.0 12.3  HCT 39.0 37.2  MCV 92.0 92.8  PLT 180 156   Cardiac Enzymes:  Recent Labs Lab 01/28/14 1215  CKTOTAL 356*  TROPONINI <0.30   BNP: BNP (last 3 results)  Recent Labs  04/27/13 1733 11/14/13 1002  PROBNP 2160.0* 1616.0*  CBG:  Recent Labs Lab 01/31/14 0621 01/31/14 1119 01/31/14 1659 01/31/14 2117 02/01/14 0624  GLUCAP 86 109* 135* 108* 89       Signed:  Jacquita Mulhearn  [facility] 02/01/2014, 10:26 AM

## 2014-02-01 NOTE — Progress Notes (Signed)
CSW (Clinical Social Worker) prepared pt dc packet and placed with shadow chart. CSW arranged non-emergent ambulance transport. Pt, pt family, pt nurse, and facility informed. CSW signing off.  Ieshia Hatcher, LCSWA [phone]  

## 2014-02-03 LAB — CULTURE, BLOOD (ROUTINE X 2)
CULTURE: NO GROWTH
Culture: NO GROWTH

## 2014-02-20 ENCOUNTER — Ambulatory Visit: Payer: Medicare Other | Admitting: Cardiology

## 2014-02-27 ENCOUNTER — Ambulatory Visit: Payer: Medicare Other | Admitting: Cardiology

## 2014-03-15 ENCOUNTER — Emergency Department (HOSPITAL_COMMUNITY): Payer: Medicare Other

## 2014-03-15 ENCOUNTER — Encounter (HOSPITAL_COMMUNITY): Payer: Self-pay | Admitting: Emergency Medicine

## 2014-03-15 ENCOUNTER — Inpatient Hospital Stay (HOSPITAL_COMMUNITY)
Admission: EM | Admit: 2014-03-15 | Discharge: 2014-03-20 | DRG: 871 | Disposition: A | Discharge status: Other Acute Inpatient Hospital | Payer: Medicare Other | Attending: Internal Medicine | Admitting: Internal Medicine

## 2014-03-15 DIAGNOSIS — Z8542 Personal history of malignant neoplasm of other parts of uterus: Secondary | ICD-10-CM

## 2014-03-15 DIAGNOSIS — L0291 Cutaneous abscess, unspecified: Secondary | ICD-10-CM

## 2014-03-15 DIAGNOSIS — D696 Thrombocytopenia, unspecified: Secondary | ICD-10-CM

## 2014-03-15 DIAGNOSIS — I5023 Acute on chronic systolic (congestive) heart failure: Secondary | ICD-10-CM

## 2014-03-15 DIAGNOSIS — L03116 Cellulitis of left lower limb: Secondary | ICD-10-CM | POA: Diagnosis present

## 2014-03-15 DIAGNOSIS — N183 Chronic kidney disease, stage 3 unspecified: Secondary | ICD-10-CM | POA: Diagnosis present

## 2014-03-15 DIAGNOSIS — G8929 Other chronic pain: Secondary | ICD-10-CM | POA: Diagnosis present

## 2014-03-15 DIAGNOSIS — I130 Hypertensive heart and chronic kidney disease with heart failure and stage 1 through stage 4 chronic kidney disease, or unspecified chronic kidney disease: Secondary | ICD-10-CM | POA: Diagnosis present

## 2014-03-15 DIAGNOSIS — Z993 Dependence on wheelchair: Secondary | ICD-10-CM

## 2014-03-15 DIAGNOSIS — F3289 Other specified depressive episodes: Secondary | ICD-10-CM | POA: Diagnosis present

## 2014-03-15 DIAGNOSIS — C541 Malignant neoplasm of endometrium: Secondary | ICD-10-CM

## 2014-03-15 DIAGNOSIS — I219 Acute myocardial infarction, unspecified: Secondary | ICD-10-CM | POA: Diagnosis present

## 2014-03-15 DIAGNOSIS — Z8674 Personal history of sudden cardiac arrest: Secondary | ICD-10-CM

## 2014-03-15 DIAGNOSIS — I2789 Other specified pulmonary heart diseases: Secondary | ICD-10-CM | POA: Diagnosis present

## 2014-03-15 DIAGNOSIS — Z7982 Long term (current) use of aspirin: Secondary | ICD-10-CM

## 2014-03-15 DIAGNOSIS — I5022 Chronic systolic (congestive) heart failure: Secondary | ICD-10-CM | POA: Diagnosis present

## 2014-03-15 DIAGNOSIS — E876 Hypokalemia: Secondary | ICD-10-CM

## 2014-03-15 DIAGNOSIS — A419 Sepsis, unspecified organism: Secondary | ICD-10-CM | POA: Diagnosis present

## 2014-03-15 DIAGNOSIS — G9341 Metabolic encephalopathy: Secondary | ICD-10-CM

## 2014-03-15 DIAGNOSIS — N189 Chronic kidney disease, unspecified: Secondary | ICD-10-CM

## 2014-03-15 DIAGNOSIS — E1165 Type 2 diabetes mellitus with hyperglycemia: Secondary | ICD-10-CM | POA: Diagnosis present

## 2014-03-15 DIAGNOSIS — J9600 Acute respiratory failure, unspecified whether with hypoxia or hypercapnia: Secondary | ICD-10-CM | POA: Diagnosis present

## 2014-03-15 DIAGNOSIS — E7800 Pure hypercholesterolemia, unspecified: Secondary | ICD-10-CM | POA: Diagnosis present

## 2014-03-15 DIAGNOSIS — J9601 Acute respiratory failure with hypoxia: Secondary | ICD-10-CM | POA: Diagnosis present

## 2014-03-15 DIAGNOSIS — I509 Heart failure, unspecified: Secondary | ICD-10-CM

## 2014-03-15 DIAGNOSIS — F411 Generalized anxiety disorder: Secondary | ICD-10-CM | POA: Diagnosis present

## 2014-03-15 DIAGNOSIS — G4730 Sleep apnea, unspecified: Secondary | ICD-10-CM

## 2014-03-15 DIAGNOSIS — G9340 Encephalopathy, unspecified: Secondary | ICD-10-CM | POA: Diagnosis present

## 2014-03-15 DIAGNOSIS — Z87891 Personal history of nicotine dependence: Secondary | ICD-10-CM

## 2014-03-15 DIAGNOSIS — R5381 Other malaise: Secondary | ICD-10-CM

## 2014-03-15 DIAGNOSIS — E785 Hyperlipidemia, unspecified: Secondary | ICD-10-CM | POA: Diagnosis present

## 2014-03-15 DIAGNOSIS — E1159 Type 2 diabetes mellitus with other circulatory complications: Secondary | ICD-10-CM | POA: Diagnosis present

## 2014-03-15 DIAGNOSIS — Z794 Long term (current) use of insulin: Secondary | ICD-10-CM

## 2014-03-15 DIAGNOSIS — F329 Major depressive disorder, single episode, unspecified: Secondary | ICD-10-CM | POA: Diagnosis present

## 2014-03-15 DIAGNOSIS — L02419 Cutaneous abscess of limb, unspecified: Secondary | ICD-10-CM | POA: Diagnosis present

## 2014-03-15 DIAGNOSIS — Z6841 Body Mass Index (BMI) 40.0 and over, adult: Secondary | ICD-10-CM

## 2014-03-15 DIAGNOSIS — R6520 Severe sepsis without septic shock: Secondary | ICD-10-CM

## 2014-03-15 DIAGNOSIS — I252 Old myocardial infarction: Secondary | ICD-10-CM

## 2014-03-15 DIAGNOSIS — I5043 Acute on chronic combined systolic (congestive) and diastolic (congestive) heart failure: Secondary | ICD-10-CM | POA: Diagnosis present

## 2014-03-15 DIAGNOSIS — E1169 Type 2 diabetes mellitus with other specified complication: Secondary | ICD-10-CM

## 2014-03-15 DIAGNOSIS — A4152 Sepsis due to Pseudomonas: Principal | ICD-10-CM | POA: Diagnosis present

## 2014-03-15 DIAGNOSIS — I798 Other disorders of arteries, arterioles and capillaries in diseases classified elsewhere: Secondary | ICD-10-CM | POA: Diagnosis present

## 2014-03-15 DIAGNOSIS — L89309 Pressure ulcer of unspecified buttock, unspecified stage: Secondary | ICD-10-CM | POA: Diagnosis present

## 2014-03-15 DIAGNOSIS — I4891 Unspecified atrial fibrillation: Secondary | ICD-10-CM

## 2014-03-15 DIAGNOSIS — Z9119 Patient's noncompliance with other medical treatment and regimen: Secondary | ICD-10-CM

## 2014-03-15 DIAGNOSIS — IMO0002 Reserved for concepts with insufficient information to code with codable children: Secondary | ICD-10-CM | POA: Diagnosis present

## 2014-03-15 DIAGNOSIS — L03119 Cellulitis of unspecified part of limb: Secondary | ICD-10-CM

## 2014-03-15 DIAGNOSIS — I480 Paroxysmal atrial fibrillation: Secondary | ICD-10-CM

## 2014-03-15 DIAGNOSIS — I959 Hypotension, unspecified: Secondary | ICD-10-CM

## 2014-03-15 DIAGNOSIS — N390 Urinary tract infection, site not specified: Secondary | ICD-10-CM | POA: Diagnosis present

## 2014-03-15 DIAGNOSIS — Z91199 Patient's noncompliance with other medical treatment and regimen due to unspecified reason: Secondary | ICD-10-CM

## 2014-03-15 DIAGNOSIS — G4733 Obstructive sleep apnea (adult) (pediatric): Secondary | ICD-10-CM | POA: Diagnosis present

## 2014-03-15 DIAGNOSIS — L8990 Pressure ulcer of unspecified site, unspecified stage: Secondary | ICD-10-CM

## 2014-03-15 DIAGNOSIS — L0390 Cellulitis, unspecified: Secondary | ICD-10-CM

## 2014-03-15 DIAGNOSIS — Z66 Do not resuscitate: Secondary | ICD-10-CM | POA: Diagnosis present

## 2014-03-15 LAB — URINALYSIS, ROUTINE W REFLEX MICROSCOPIC
Glucose, UA: NEGATIVE mg/dL
KETONES UR: NEGATIVE mg/dL
NITRITE: NEGATIVE
PH: 5 (ref 5.0–8.0)
Protein, ur: NEGATIVE mg/dL
Specific Gravity, Urine: 1.018 (ref 1.005–1.030)
UROBILINOGEN UA: 0.2 mg/dL (ref 0.0–1.0)

## 2014-03-15 LAB — GLUCOSE, CAPILLARY: Glucose-Capillary: 117 mg/dL — ABNORMAL HIGH (ref 70–99)

## 2014-03-15 LAB — BASIC METABOLIC PANEL
BUN: 35 mg/dL — ABNORMAL HIGH (ref 6–23)
CO2: 21 mEq/L (ref 19–32)
Calcium: 8.9 mg/dL (ref 8.4–10.5)
Chloride: 102 mEq/L (ref 96–112)
Creatinine, Ser: 1.24 mg/dL — ABNORMAL HIGH (ref 0.50–1.10)
GFR calc Af Amer: 47 mL/min — ABNORMAL LOW (ref >90)
GFR calc non Af Amer: 41 mL/min — ABNORMAL LOW (ref >90)
Glucose, Bld: 102 mg/dL — ABNORMAL HIGH (ref 70–99)
Potassium: 4.8 mEq/L (ref 3.7–5.3)
Sodium: 141 mEq/L (ref 137–147)

## 2014-03-15 LAB — URINE MICROSCOPIC-ADD ON

## 2014-03-15 LAB — PROTIME-INR
INR: 1.2 (ref 0.00–1.49)
Prothrombin Time: 14.9 seconds (ref 11.6–15.2)

## 2014-03-15 LAB — CBC WITH DIFFERENTIAL/PLATELET
BASOS ABS: 0 10*3/uL (ref 0.0–0.1)
Basophils Relative: 0 % (ref 0–1)
EOS PCT: 0 % (ref 0–5)
Eosinophils Absolute: 0 10*3/uL (ref 0.0–0.7)
HCT: 43.3 % (ref 36.0–46.0)
Hemoglobin: 14.6 g/dL (ref 12.0–15.0)
Lymphocytes Relative: 2 % — ABNORMAL LOW (ref 12–46)
Lymphs Abs: 0.3 10*3/uL — ABNORMAL LOW (ref 0.7–4.0)
MCH: 30.1 pg (ref 26.0–34.0)
MCHC: 33.7 g/dL (ref 30.0–36.0)
MCV: 89.3 fL (ref 78.0–100.0)
Monocytes Absolute: 0.7 10*3/uL (ref 0.1–1.0)
Monocytes Relative: 4 % (ref 3–12)
NEUTROS ABS: 17.8 10*3/uL — AB (ref 1.7–7.7)
Neutrophils Relative %: 94 % — ABNORMAL HIGH (ref 43–77)
PLATELETS: 194 10*3/uL (ref 150–400)
RBC: 4.85 MIL/uL (ref 3.87–5.11)
RDW: 15.2 % (ref 11.5–15.5)
WBC: 18.8 10*3/uL — ABNORMAL HIGH (ref 4.0–10.5)

## 2014-03-15 LAB — PRO B NATRIURETIC PEPTIDE: Pro B Natriuretic peptide (BNP): 3771 pg/mL — ABNORMAL HIGH (ref 0–450)

## 2014-03-15 LAB — TROPONIN I: Troponin I: <0.3 ng/mL (ref <0.30)

## 2014-03-15 MED ORDER — ACETAMINOPHEN 650 MG RE SUPP: 650.0000 mg | Freq: Four times a day (QID) | RECTAL | Status: DC | PRN

## 2014-03-15 MED ORDER — ONDANSETRON HCL 4 MG PO TABS: 4.0000 mg | ORAL_TABLET | Freq: Four times a day (QID) | ORAL | Status: DC | PRN

## 2014-03-15 MED ORDER — AMIODARONE HCL IN DEXTROSE 360-4.14 MG/200ML-% IV SOLN
60.0000 mg/h | INTRAVENOUS | Status: AC
  Administered 2014-03-15: 60 mg/h via INTRAVENOUS
  Filled 2014-03-15 (×3): qty 200

## 2014-03-15 MED ORDER — SODIUM CHLORIDE 0.9 % IV SOLN
2000.0000 mg | Freq: Once | INTRAVENOUS | Status: AC
  Administered 2014-03-15: 2000 mg via INTRAVENOUS
  Filled 2014-03-15: qty 2000

## 2014-03-15 MED ORDER — SODIUM CHLORIDE 0.9 % IV SOLN
INTRAVENOUS | Status: DC
Start: 2014-03-15 — End: 2014-03-15
  Administered 2014-03-15: 50 mL/h via INTRAVENOUS

## 2014-03-15 MED ORDER — DILTIAZEM HCL 100 MG IV SOLR: 15.0000 mg/h | Freq: Once | INTRAVENOUS | Status: DC

## 2014-03-15 MED ORDER — DILTIAZEM HCL 100 MG IV SOLR: 10.0000 mg/h | Freq: Once | INTRAVENOUS | Status: DC

## 2014-03-15 MED ORDER — CEFTRIAXONE SODIUM 1 G IJ SOLR: 1.0000 g | Freq: Once | INTRAMUSCULAR | Status: DC

## 2014-03-15 MED ORDER — SODIUM CHLORIDE 0.9 % IJ SOLN
3.0000 mL | Freq: Two times a day (BID) | INTRAMUSCULAR | Status: DC
  Administered 2014-03-17: 10:00:00 via INTRAVENOUS
  Administered 2014-03-17 – 2014-03-19 (×5): 3 mL via INTRAVENOUS

## 2014-03-15 MED ORDER — AMIODARONE HCL IN DEXTROSE 360-4.14 MG/200ML-% IV SOLN
30.0000 mg/h | INTRAVENOUS | Status: DC
Start: 2014-03-15 — End: 2014-03-15
  Administered 2014-03-15: 30 mg/h via INTRAVENOUS
  Filled 2014-03-15 (×2): qty 200

## 2014-03-15 MED ORDER — HEPARIN SODIUM (PORCINE) 5000 UNIT/ML IJ SOLN
5000.0000 [IU] | Freq: Three times a day (TID) | INTRAMUSCULAR | Status: DC
  Administered 2014-03-16 – 2014-03-19 (×12): 5000 [IU] via SUBCUTANEOUS
  Filled 2014-03-15 (×16): qty 1

## 2014-03-15 MED ORDER — DEXTROSE 5 % IV SOLN
1.0000 g | Freq: Three times a day (TID) | INTRAVENOUS | Status: DC
  Administered 2014-03-16 – 2014-03-18 (×8): 1 g via INTRAVENOUS
  Filled 2014-03-15 (×9): qty 1

## 2014-03-15 MED ORDER — DILTIAZEM HCL 25 MG/5ML IV SOLN: 15.0000 mg | Freq: Once | INTRAVENOUS | Status: DC

## 2014-03-15 MED ORDER — DEXTROSE 5 % IV SOLN
2.0000 g | Freq: Once | INTRAVENOUS | Status: AC
  Administered 2014-03-15: 2 g via INTRAVENOUS

## 2014-03-15 MED ORDER — VANCOMYCIN HCL IN DEXTROSE 1-5 GM/200ML-% IV SOLN
1000.0000 mg | Freq: Two times a day (BID) | INTRAVENOUS | Status: DC
  Administered 2014-03-16: 1000 mg via INTRAVENOUS
  Filled 2014-03-15 (×2): qty 200

## 2014-03-15 MED ORDER — DILTIAZEM HCL 100 MG IV SOLR
5.0000 mg/h | INTRAVENOUS | Status: DC
  Administered 2014-03-15: 5 mg/h via INTRAVENOUS

## 2014-03-15 MED ORDER — ONDANSETRON HCL 4 MG/2ML IJ SOLN
4.0000 mg | Freq: Four times a day (QID) | INTRAMUSCULAR | Status: DC | PRN
  Administered 2014-03-15: 4 mg via INTRAVENOUS
  Filled 2014-03-15: qty 2

## 2014-03-15 MED ORDER — DIGOXIN 0.25 MG/ML IJ SOLN
0.1250 mg | Freq: Four times a day (QID) | INTRAMUSCULAR | Status: AC
  Administered 2014-03-16 (×2): 0.125 mg via INTRAVENOUS
  Filled 2014-03-15 (×2): qty 0.5

## 2014-03-15 MED ORDER — DILTIAZEM LOAD VIA INFUSION
10.0000 mg | Freq: Once | INTRAVENOUS | Status: AC
  Administered 2014-03-15: 10 mg via INTRAVENOUS

## 2014-03-15 MED ORDER — ACETAMINOPHEN 325 MG PO TABS
650.0000 mg | ORAL_TABLET | Freq: Four times a day (QID) | ORAL | Status: DC | PRN
  Administered 2014-03-15: 650 mg via ORAL
  Filled 2014-03-15: qty 2

## 2014-03-15 MED ORDER — AMIODARONE LOAD VIA INFUSION
150.0000 mg | Freq: Once | INTRAVENOUS | Status: AC
  Administered 2014-03-15: 150 mg via INTRAVENOUS
  Filled 2014-03-15: qty 83.34

## 2014-03-15 NOTE — ED Notes (Addendum)
Cardiology at the bedside. 15mg  bolus held at this time due to BP being 98/77

## 2014-03-15 NOTE — ED Notes (Signed)
Admitting MD at the bedside.  

## 2014-03-15 NOTE — H&P (Addendum)
[facility] History and Physical  Tara Huff WPY:[id number] DOB: Sep 19, 1934 DOA: 03/15/2014  Referring physician: er PCP: Reginia Naas, MD   Chief Complaint: fatigue  HPI: Tara Huff is a 80 y.o. female  Who is coming from home.  She was recently discharged from [facility].  Her husband has been caring for her.  At baseline, she requires help getting out of bed, she spends the entire day in a wheelchair.   She wears a diaper and has decubitus ulcers.  She presents to the hospital after feeling tired, she was found by EMS to be tachycardic with a rate of 200- was given adenosine without improvement.  She then was given Cardizem and was found to be in a fib in the ER.     No chest pain, no SOB, no fevers, no chills  She was seen by cardiology who started patient on amiodarone.  She was also thought to be septic  Spoke with husband who confirms that patient is DNR/DNI.    Review of Systems:  Unable to do as AMS  Past Medical History  Diagnosis Date  . Hypertension   . Atrial fibrillation   . MI (myocardial infarction)   . Anxiety   . Hypercholesterolemia   . Shoulder pain   . Sleep apnea     intolerant to CPAP  . Type 2 diabetes mellitus   . OSA (obstructive sleep apnea)   . Hypertensive heart disease   . Morbid obesity   . Pyogenic granuloma   . Cancer     cervical  . Depression   . Pneumonia   . CHF (congestive heart failure)   . Cardiac arrest 11/14/2013  . Complete heart block 11/14/2013  . OSTEOMYELITIS 04/28/2010    L great Toe      . Carcinoma of endometrium 11/20/2013   Past Surgical History  Procedure Laterality Date  . Knee surgery      at age 3  . Cataract surgery      both eyes  . Abdominal hysterectomy    . Toe amputation     Social History:  reports that she quit smoking about 21 years ago. Her smoking use included Cigarettes. She has a 20 pack-year smoking history. She has never used smokeless tobacco. She reports that she does not  drink alcohol or use illicit drugs.  No Known Allergies  Family History  Problem Relation Age of Onset  . Emphysema Father   . Cancer Mother     intestinal  . Cancer Father      Prior to Admission medications   Medication Sig Start Date End Date Taking? Authorizing Provider  acetaminophen (TYLENOL) 500 MG tablet Take 1,000 mg by mouth 2 (two) times daily.   Yes Historical Provider, MD  ALPRAZolam (XANAX) 0.25 MG tablet Take 1-2 tablets (0.25-0.5 mg total) by mouth 2 (two) times daily as needed for anxiety. Take one tablet by mouth every 12 hours as needed for anxiety 02/01/14  Yes Kelvin Cellar, MD  aspirin EC 81 MG EC tablet Take 1 tablet (81 mg total) by mouth daily. 05/01/13  Yes Ripudeep Krystal Eaton, MD  cefUROXime (CEFTIN) 250 MG tablet Take 1 tablet (250 mg total) by mouth 2 (two) times daily with a meal. 02/01/14  Yes Kelvin Cellar, MD  Cholecalciferol (VITAMIN D) 1000 UNITS capsule Take 1,000 Units by mouth daily.     Yes Historical Provider, MD  diltiazem (CARDIZEM) 30 MG tablet Take 30 mg by mouth 3 (three) times  daily.    Yes Historical Provider, MD  diphenoxylate-atropine (LOMOTIL) 2.5-0.025 MG per tablet Take 1 tablet by mouth 4 (four) times daily as needed for diarrhea or loose stools. 02/01/14  Yes Kelvin Cellar, MD  furosemide (LASIX) 20 MG tablet Take 2 tablets (40 mg total) by mouth daily. 01/03/14  Yes Gerlene Fee, NP  glimepiride (AMARYL) 2 MG tablet Take 2 mg by mouth daily with breakfast.   Yes Historical Provider, MD  insulin aspart (NOVOLOG) 100 UNIT/ML injection Inject 0-9 Units into the skin 3 (three) times daily with meals. 02/01/14  Yes Kelvin Cellar, MD   Physical Exam: Filed Vitals:   03/15/14 1645  BP: 108/79  Pulse: 137  Temp:   Resp: 31    BP 108/79  Pulse 137  Temp(Src) 99.7 F (37.6 C) (Oral)  Resp 31  SpO2 100%  General: chronically ill appearing, obese, elderly,female- appears uncomfortable Head: Normocephalic, atraumatic, sclera  non-icteric, no xanthomas, nares are without discharge. Dentition: Poor  Neck: No carotid bruits. JVD at about 8 cm. No thyromegally  Lungs: Good expansion bilaterally. without wheezes or rhonchi. Poor inspiratory effort, rales bases B/L  Heart: IRRegular rate and rhythm  +systolic murmur, no rubs, or gallops appreciated.  Abdomen: Soft, non-tender, non-distended with normoactive bowel sounds. No hepatomegaly. No rebound/guarding. No obvious abdominal masses.  Msk: Strength and tone appear generally weak for age. No joint deformities or effusions, no spine or costo-vertebral angle tenderness.  Extremities: No clubbing or cyanosis. Chronic lower extremity edema. Distal pedal pulses are decreased in both lower extrem. Significant erythema with 1 open lesion, no purulent drainage noticed on left leg Neuro: Alert and oriented X 2. Moves all extremities spontaneously. No focal deficits noted.  Psych: Responds to questions - most responses are appropriate.  Skin: chronic changes- see above           Labs on Admission:  Basic Metabolic Panel:  Recent Labs Lab 03/15/14 1331  NA 141  K 4.8  CL 102  CO2 21  GLUCOSE 102*  BUN 35*  CREATININE 1.24*  CALCIUM 8.9   Liver Function Tests: No results found for this basename: AST, ALT, ALKPHOS, BILITOT, PROT, ALBUMIN,  in the last 168 hours No results found for this basename: LIPASE, AMYLASE,  in the last 168 hours No results found for this basename: AMMONIA,  in the last 168 hours CBC:  Recent Labs Lab 03/15/14 1331  WBC 18.8*  NEUTROABS 17.8*  HGB 14.6  HCT 43.3  MCV 89.3  PLT 194   Cardiac Enzymes:  Recent Labs Lab 03/15/14 1331  TROPONINI <0.30    BNP (last 3 results)  Recent Labs  04/27/13 1733 11/14/13 1002 03/15/14 1331  PROBNP 2160.0* 1616.0* 3771.0*   CBG: No results found for this basename: GLUCAP,  in the last 168 hours  Radiological Exams on Admission: Dg Chest Port 1 View  03/15/2014   CLINICAL DATA:   Shortness of breath and weakness.  EXAM: PORTABLE CHEST - 1 VIEW  COMPARISON:  DG CHEST 1 VIEW dated 01/28/2014  FINDINGS: Cardiac silhouette remains at least moderately enlarged, mediastinal silhouette is nonsuspicious. Diffuse mild interstitial prominence with central pulmonary vasculature congestion. Strandy densities in lung bases, with probable trace pleural effusions. No pneumothorax.  Multiple EKG lines overlie the patient and may obscure subtle underlying pathology. Degenerative change of the right shoulder. Mild degenerative changes thoracic spine.  IMPRESSION: Stable cardiomegaly and interstitial pulmonary edema with bibasilar atelectasis with suspected trace pleural effusions.   Electronically  Signed   By: Elon Alas   On: 03/15/2014 14:10    EKG: Independently reviewed. A fib with RVR  Assessment/Plan Active Problems:   Sleep apnea   Debility   Carcinoma of endometrium   CKD (chronic kidney disease), stage III   Type II or unspecified type diabetes mellitus with peripheral circulatory disorders, uncontrolled(250.72)   UTI (lower urinary tract infection)   Atrial fibrillation with rapid ventricular response   Cellulitis   Acute on chronic systolic heart failure   Decubitus skin ulcer    Acute sepsis with acute encephalopathy, possibly due to lower extremity cellulitis/UTI/decubitus ulcers - blood cultures, lactic acid, urine cultures   A-fib with RVR - the patient is in chronic a-fib, now in rapid ventricular rate, secondary to sepsis, BP rather low, per cards start an amiodarone drip as she won't tolerate beta-blockers of calcium channel blockers.  She was not on anticoagulation, probably because she was  high fall risk.    Acute on chronic systolic CHF - no IVF  Sleep apnea- does not tolerate CPAP  UTI- broad spectrum abx  decubitus ulcer- large- wound care consult  CKD- stable  DM- SSI  Left leg cellulitis duplex b/l leg- erythema on chronic changes. -at risk  as she is not mobile and has h/o cancer  Leukocytosis  Morbid obesity  May need a palliative care consult if does not respond- doubt husband can take care of her at home  Code Status: DNR per husband Family Communication: husband Disposition Plan: SDU  Time spent: 73 min  [facility] Pager [phone]

## 2014-03-15 NOTE — ED Notes (Signed)
Pt to department via EMS from home- pt reports that she started having n/v this morning and her home health nurse called because she was concerned. Hr-240-260 on EMS arrival. 6mg ,12mg  adenosine, rate to the 190's. Pt then given 10mg  Cardizem with a rate of 150's. CBG-111 Bp-120/100 18g in the left arm.

## 2014-03-15 NOTE — ED Notes (Signed)
Provider at the bedside.  

## 2014-03-15 NOTE — Progress Notes (Signed)
ANTIBIOTIC CONSULT NOTE - INITIAL  Pharmacy Consult for vancomycin + cefepime Indication: cellulitis/UTI/decub ulcer  No Known Allergies  Patient Measurements:   Adjusted Body Weight:   Vital Signs: Temp: 97.9 F (36.6 C) (05/07 1730) Temp src: Oral (05/07 1730) BP: 104/71 mmHg (05/07 1800) Pulse Rate: 128 (05/07 1800) Intake/Output from previous day:   Intake/Output from this shift:    Labs:  Recent Labs  03/15/14 1331  WBC 18.8*  HGB 14.6  PLT 194  CREATININE 1.24*   The CrCl is unknown because both a height and weight (above a minimum accepted value) are required for this calculation. No results found for this basename: VANCOTROUGH, VANCOPEAK, VANCORANDOM, GENTTROUGH, GENTPEAK, GENTRANDOM, TOBRATROUGH, TOBRAPEAK, TOBRARND, AMIKACINPEAK, AMIKACINTROU, AMIKACIN,  in the last 72 hours   Microbiology: No results found for this or any previous visit (from the past 720 hour(s)).  Medical History: Past Medical History  Diagnosis Date  . Hypertension   . Atrial fibrillation   . MI (myocardial infarction)   . Anxiety   . Hypercholesterolemia   . Shoulder pain   . Sleep apnea     intolerant to CPAP  . Type 2 diabetes mellitus   . OSA (obstructive sleep apnea)   . Hypertensive heart disease   . Morbid obesity   . Pyogenic granuloma   . Cancer     cervical  . Depression   . Pneumonia   . CHF (congestive heart failure)   . Cardiac arrest 11/14/2013  . Complete heart block 11/14/2013  . OSTEOMYELITIS 04/28/2010    L great Toe      . Carcinoma of endometrium 11/20/2013    Medications:  Anti-infectives   Start     Dose/Rate Route Frequency Ordered Stop   03/16/14 0800  vancomycin (VANCOCIN) IVPB 1000 mg/200 mL premix     1,000 mg 200 mL/hr over 60 Minutes Intravenous Every 12 hours 03/15/14 1833     03/16/14 0300  ceFEPIme (MAXIPIME) 1 g in dextrose 5 % 50 mL IVPB     1 g 100 mL/hr over 30 Minutes Intravenous Every 8 hours 03/15/14 1833     03/15/14 1830   ceFEPIme (MAXIPIME) 2 g in dextrose 5 % 50 mL IVPB     2 g 100 mL/hr over 30 Minutes Intravenous  Once 03/15/14 1827     03/15/14 1830  vancomycin (VANCOCIN) 2,000 mg in sodium chloride 0.9 % 500 mL IVPB     2,000 mg 250 mL/hr over 120 Minutes Intravenous  Once 03/15/14 1827     03/15/14 1700  cefTRIAXone (ROCEPHIN) 1 g in dextrose 5 % 50 mL IVPB  Status:  Discontinued     1 g 100 mL/hr over 30 Minutes Intravenous  Once 03/15/14 1656 03/15/14 1826     Assessment: 56 yof presented to the ED with fatigue. To start empiric vancomycin + cefepime for coverage. Pt is afebrile and WBC is elevated at 18.8. Scr is mildly elevated at 1.24.   Vanc 5/7>> Cefepime 5/7>>  Goal of Therapy:  Vancomycin trough level 10-15 mcg/ml  Plan:  1. Vancomycin 2gm IV x 1 then 1gm IV Q12H 2. Cefepime 2gm IV x 1 then 1gm IV Q8H 3. F/u renal fxn, C&S, clinical status and trough at [place] 03/15/2014,6:33 PM

## 2014-03-15 NOTE — Consult Note (Signed)
CARDIOLOGY CONSULT NOTE    Patient ID: Tara Huff MRN: [id number], DOB/AGE: February 02, 1934 80 y.o. Date of Encounter: 03/15/2014  Primary Physician: Tara Naas, MD Primary Cardiologist: Dr. Radford Huff  Chief Complaint:  Rapid atrial   HPI: Tara Huff is a 80 y.o. female with a history of PAF, chronic kidney disease stage III, UTI, anxiety, diabetes, diastolic CHF. She was admitted in March with metabolic encephalopathy and discharged to [facility] for rehabilitation. She was discharged home over 2 weeks ago.  Today, she did not feel well and EMS was called. Upon their arrival, she was in there are complex tachycardia, rate reportedly greater than 200. She was given adenosine 6 mg and 12 mg by EMS, without a change in her rhythm but with improvement in the rate. She also got Cardizem 10 mg IV. Upon arrival to the emergency room, she was in rapid atrial fibrillation. Her rate improved to approximately 140 on IV Cardizem at 10 mg per hour, but she continues to feel poorly.  Tara Huff is confused. She is oriented to name and place. She states she did not feel very well but denies palpitations, chest pain, or shortness of breath. She feels a little bit better now that her heart rate control has improved, but still is significantly below her baseline. At her baseline, she uses a wheelchair and her husband helps a great deal in her daily care. She does not know her medications. Her husband helps with these and with ADLs. She reports nausea but no vomiting and some chills. She is not aware of any fevers. She sleeps unless rales arouses easily to verbal.   Past Medical History  Diagnosis Date  . Hypertension   . Atrial fibrillation   . MI (myocardial infarction)   . Anxiety   . Hypercholesterolemia   . Shoulder pain   . Sleep apnea     intolerant to CPAP  . Type 2 diabetes mellitus   . OSA (obstructive sleep apnea)   . Hypertensive heart disease   . Morbid obesity   .  Pyogenic granuloma   . Cancer     cervical  . Depression   . Pneumonia   . CHF (congestive heart failure)   . Cardiac arrest 11/14/2013  . Complete heart block 11/14/2013  . OSTEOMYELITIS 04/28/2010    L great Toe      . Carcinoma of endometrium 11/20/2013    Surgical History:  Past Surgical History  Procedure Laterality Date  . Knee surgery      at age 39  . Cataract surgery      both eyes  . Abdominal hysterectomy    . Toe amputation       I have reviewed the patient's current medications. Medication Sig  acetaminophen (TYLENOL) 500 MG tablet Take 1,000 mg by mouth 2 (two) times daily.  ALPRAZolam (XANAX) 0.25 MG tablet Take 1-2 tablets (0.25-0.5 mg total) by mouth 2 (two) times daily as needed for anxiety. Take one tablet by mouth every 12 hours as needed for anxiety  aspirin EC 81 MG EC tablet Take 1 tablet (81 mg total) by mouth daily.  cefUROXime (CEFTIN) 250 MG tablet Take 1 tablet (250 mg total) by mouth 2 (two) times daily with a meal.  Cholecalciferol (VITAMIN D) 1000 UNITS capsule Take 1,000 Units by mouth daily.    diltiazem (CARDIZEM) 30 MG tablet Take 30 mg by mouth 3 (three) times daily.   diphenoxylate-atropine (LOMOTIL) 2.5-0.025 MG per tablet Take  1 tablet by mouth 4 (four) times daily as needed for diarrhea or loose stools.  furosemide (LASIX) 20 MG tablet Take 2 tablets (40 mg total) by mouth daily.  glimepiride (AMARYL) 2 MG tablet Take 2 mg by mouth daily with breakfast.  insulin aspart (NOVOLOG) 100 UNIT/ML injection Inject 0-9 Units into the skin 3 (three) times daily with meals.  oxyCODONE (OXY IR/ROXICODONE) 5 MG immediate release tablet Take 1 tablet (5 mg total) by mouth every 6 (six) hours as needed for severe pain.  sertraline (ZOLOFT) 100 MG tablet Take 1 tablet (100 mg total) by mouth at bedtime.  spironolactone (ALDACTONE) 25 MG tablet Take 25 mg by mouth daily.   Scheduled Meds:  Continuous Infusions: . sodium chloride 50 mL/hr (03/15/14 1418)  .  amiodarone 60 mg/hr (03/15/14 1540)   And  . amiodarone     Allergies: No Known Allergies  History   Social History  . Marital Status: Married    Spouse Name: N/A    Number of Children: 1  . Years of Education: N/A   Occupational History  . retired    Social History Main Topics  . Smoking status: Former Smoker -- 1.00 packs/day for 20 years    Types: Cigarettes    Quit date: 11/09/1992  . Smokeless tobacco: Never Used  . Alcohol Use: No  . Drug Use: No  . Sexual Activity: No   Other Topics Concern  . Not on file   Social History Narrative   Lives with the husband, who helps in her daily care.    Family History  Problem Relation Age of Onset  . Emphysema Father   . Cancer Mother     intestinal  . Cancer Father    Family Status  Relation Status Death Age  . Father Deceased   . Mother Deceased     Review of Systems:   Full 14-point review of systems otherwise negative except as noted above.  Physical Exam: Blood pressure 110/72, pulse 149, temperature 99.7 F (37.6 C), temperature source Oral, resp. rate 28, SpO2 97.00%. General: Well developed, obese, elderly,female in no acute distress but appears a little uncomfortable and chronically ill. Head: Normocephalic, atraumatic, sclera non-icteric, no xanthomas, nares are without discharge. Dentition: Poor  Neck: No carotid bruits. JVD at about 8 cm. No thyromegally Lungs: Good expansion bilaterally. without wheezes or rhonchi. Poor inspiratory effort, rales bases B/L Heart: IRRegular rate and rhythm with S1 S2.  No S3 or S4.  2/6 systolic murmur, no rubs, or gallops appreciated. Abdomen: Soft, non-tender, non-distended with normoactive bowel sounds. No hepatomegaly. No rebound/guarding. No obvious abdominal masses. Msk:  Strength and tone appear generally weak for age. No joint deformities or effusions, no spine or costo-vertebral angle tenderness. Extremities: No clubbing or cyanosis. Chronic lower extremity edema.   Distal pedal pulses are decreased in both lower extrem. Significant erythema with 1 open lesion, no purulent drainage noticed Neuro: Alert and oriented X 2. Moves all extremities spontaneously. No focal deficits noted. Psych:  Responds to questions appropriately with a normal affect. Skin: No rashes or lesions noted  Labs:   Lab Results  Component Value Date   WBC 18.8* 03/15/2014   HGB 14.6 03/15/2014   HCT 43.3 03/15/2014   MCV 89.3 03/15/2014   PLT 194 03/15/2014    Recent Labs  03/15/14 1331  INR 1.20     Recent Labs Lab 03/15/14 1331  NA 141  K 4.8  CL 102  CO2 21  BUN 35*  CREATININE 1.24*  CALCIUM 8.9  GLUCOSE 102*    Recent Labs  03/15/14 1331  TROPONINI <0.30   Pro B Natriuretic peptide (BNP)  Date/Time Value Ref Range Status  03/15/2014  1:31 PM 3771.0* 0 - 450 pg/mL Final  11/14/2013 10:02 AM 1616.0* 0 - 450 pg/mL Final   Radiology/Studies: Dg Chest Port 1 View 03/15/2014   CLINICAL DATA:  Shortness of breath and weakness.  EXAM: PORTABLE CHEST - 1 VIEW  COMPARISON:  DG CHEST 1 VIEW dated 01/28/2014  FINDINGS: Cardiac silhouette remains at least moderately enlarged, mediastinal silhouette is nonsuspicious. Diffuse mild interstitial prominence with central pulmonary vasculature congestion. Strandy densities in lung bases, with probable trace pleural effusions. No pneumothorax.  Multiple EKG lines overlie the patient and may obscure subtle underlying pathology. Degenerative change of the right shoulder. Mild degenerative changes thoracic spine.  IMPRESSION: Stable cardiomegaly and interstitial pulmonary edema with bibasilar atelectasis with suspected trace pleural effusions.   Electronically Signed   By: Elon Alas   On: 03/15/2014 14:10   TTE 11/14/2013  - Left ventricle: Paradoxical septal motion may be related both to pulmonary hypertension and pacing. EF difficult to aseesss. I think 40% range. The cavity size was normal. Wall thickness was normal. - Mitral  valve: Mild regurgitation. - Left atrium: The atrium was moderately dilated. - Right ventricle: The pacing wire is probably seen. The cavity size was moderately dilated. Systolic function was mildly to moderately reduced. - Right atrium: The atrium was severely dilated. - Tricuspid valve: The valve appears to be grossly normal. Moderate-severe regurgitation. - Pulmonary arteries: PA peak pressure: 64mm Hg (S).  ECG: Atrial fibrillation, rapid ventricular response  ASSESSMENT AND PLAN:  Active Problems:   Atrial fibrillation with rapid ventricular response - DC Cardizem, use amiodarone with borderline blood pressure and elevated heart rate. Do not see systemic anticoagulation on her med list, however this has not been updated. Discuss with MD.   Lemont Fillers, PA-C 03/15/2014 4:04 PM Beeper [phone]  The patient was seen, examined and discussed with Rosaria Ferries, PA-C and I agree with the above.   Mrs. Kope is a 80 year old female with h/o chronic a-fib on no anticoagulation, chronic systolic CHF with  LVEF 61% and, righ heart failure and pulmonary hypertension, h/o cardiac arrest with bradycardia with placement of temporary PM. She was DNR at the discharge. She was again admitted in March with metabolic encephalopathy and was just recently discharged from rehab.  She called EMS for not "feeling well" and was found to be obtunded in a-fib with RVR with ventricular rate 200 BPM that was slowed down to 140-150 with adenosine and cardizem bolus.  On physical exam the patient appears confused, diaphoretic, with severely swollen, erythematous lower extremities that are very warm. Temperature on admission 99.7, there is leukocytosis of 18.000 and BNP 3700.  1. Acute sepsis with acute encephalopathy, possibly due to lower extremity cellulitis - we will defer to primary team for the antibiotic therapy, we ordered blood cultures  2. A-fib with RVR - the patient is in chronic a-fib,  now in rapid ventricular rate, secondary to sepsis, BP rather low, we will start amiodarone drip as she won't tolerate beta-blockers of calcium channel blockers. She was not on anticoagulation, probably because she was DNR/DNI and high fall risk.  3. Acute on chronic systolic CHF - we would recommend very careful diuresis in the settings of sepsis and low BP.   We will follow.  DNR status needs to clarified, she was DNR at the discharge in January. She is not competent to answer this quetion today and we couldn't reach her husband on the phone.  Dorothy Spark 03/15/2014

## 2014-03-15 NOTE — ED Provider Notes (Signed)
CSN: [id number]     Arrival date & time 03/15/14  1312 History   First MD Initiated Contact with Patient 03/15/14 1326     Chief Complaint  Patient presents with  . Irregular Heart Beat      HPI Pt was seen at 1325. Per EMS and pt report, c/o sudden onset and resolution of several episodes of N/V that occurred this morning PTA. Pt's Home Health RN called EMS. EMS noted pt's HR "240-260" on their arrival to scene. EMS gave IV adenosine 6mg  and 12mg  with HR decreasing to "190's." EMS gave IV cardizem 10mg  with HR decreasing to "150's." Pt states she "just feels tired." Pt denies CP/palpitations, no SOB/cough, no abd pain, no N/V/D, no back pain.    Past Medical History  Diagnosis Date  . Hypertension   . Atrial fibrillation   . MI (myocardial infarction)   . Anxiety   . Hypercholesterolemia   . Shoulder pain   . Sleep apnea     intolerant to CPAP  . Type 2 diabetes mellitus   . OSA (obstructive sleep apnea)   . Hypertensive heart disease   . Morbid obesity   . Pyogenic granuloma   . Cancer     cervical  . Depression   . Pneumonia   . CHF (congestive heart failure)   . Cardiac arrest 11/14/2013  . Complete heart block 11/14/2013  . OSTEOMYELITIS 04/28/2010    L great Toe      . Carcinoma of endometrium 11/20/2013   Past Surgical History  Procedure Laterality Date  . Knee surgery      at age 40  . Cataract surgery      both eyes  . Abdominal hysterectomy    . Toe amputation     Family History  Problem Relation Age of Onset  . Emphysema Father   . Cancer Mother     intestinal  . Cancer Father    History  Substance Use Topics  . Smoking status: Former Smoker -- 1.00 packs/day for 20 years    Types: Cigarettes    Quit date: 11/09/1992  . Smokeless tobacco: Never Used  . Alcohol Use: No    Review of Systems ROS: Statement: All systems negative except as marked or noted in the HPI; Constitutional: Negative for fever and chills. ; ; Eyes: Negative for eye pain, redness  and discharge. ; ; ENMT: Negative for ear pain, hoarseness, nasal congestion, sinus pressure and sore throat. ; ; Cardiovascular: Negative for chest pain, palpitations, diaphoresis, dyspnea and peripheral edema. ; ; Respiratory: Negative for cough, wheezing and stridor. ; ; Gastrointestinal: +N/V. Negative for diarrhea, abdominal pain, blood in stool, hematemesis, jaundice and rectal bleeding. . ; ; Genitourinary: Negative for dysuria, flank pain and hematuria. ; ; Musculoskeletal: Negative for back pain and neck pain. Negative for swelling and trauma.; ; Skin: Negative for pruritus, rash, abrasions, blisters, bruising and skin lesion.; ; Neuro: Negative for headache, lightheadedness and neck stiffness. Negative for altered level of consciousness , altered mental status, extremity weakness, paresthesias, involuntary movement, seizure and syncope.      Allergies  Review of patient's allergies indicates no known allergies.  Home Medications   Prior to Admission medications   Medication Sig Start Date End Date Taking? Authorizing Provider  acetaminophen (TYLENOL) 500 MG tablet Take 1,000 mg by mouth 2 (two) times daily.   Yes Historical Provider, MD  ALPRAZolam (XANAX) 0.25 MG tablet Take 1-2 tablets (0.25-0.5 mg total) by mouth 2 (two)  times daily as needed for anxiety. Take one tablet by mouth every 12 hours as needed for anxiety 02/01/14  Yes Kelvin Cellar, MD  aspirin EC 81 MG EC tablet Take 1 tablet (81 mg total) by mouth daily. 05/01/13  Yes Ripudeep Krystal Eaton, MD  cefUROXime (CEFTIN) 250 MG tablet Take 1 tablet (250 mg total) by mouth 2 (two) times daily with a meal. 02/01/14  Yes Kelvin Cellar, MD  Cholecalciferol (VITAMIN D) 1000 UNITS capsule Take 1,000 Units by mouth daily.     Yes Historical Provider, MD  diltiazem (CARDIZEM) 30 MG tablet Take 30 mg by mouth 3 (three) times daily.    Yes Historical Provider, MD  diphenoxylate-atropine (LOMOTIL) 2.5-0.025 MG per tablet Take 1 tablet by mouth 4  (four) times daily as needed for diarrhea or loose stools. 02/01/14  Yes Kelvin Cellar, MD  furosemide (LASIX) 20 MG tablet Take 2 tablets (40 mg total) by mouth daily. 01/03/14  Yes Gerlene Fee, NP  glimepiride (AMARYL) 2 MG tablet Take 2 mg by mouth daily with breakfast.   Yes Historical Provider, MD  insulin aspart (NOVOLOG) 100 UNIT/ML injection Inject 0-9 Units into the skin 3 (three) times daily with meals. 02/01/14  Yes Kelvin Cellar, MD  oxyCODONE (OXY IR/ROXICODONE) 5 MG immediate release tablet Take 1 tablet (5 mg total) by mouth every 6 (six) hours as needed for severe pain. 11/24/13   Ripudeep Krystal Eaton, MD  sertraline (ZOLOFT) 100 MG tablet Take 1 tablet (100 mg total) by mouth at bedtime. 11/24/13   Ripudeep Krystal Eaton, MD  spironolactone (ALDACTONE) 25 MG tablet Take 25 mg by mouth daily.    Historical Provider, MD   BP 110/72  Pulse 149  Temp(Src) 99.7 F (37.6 C) (Oral)  Resp 28  SpO2 97% Physical Exam 1330: Physical examination:  Nursing notes reviewed; Vital signs and O2 SAT reviewed;  Constitutional: Well developed, Well nourished, In no acute distress; Head:  Normocephalic, atraumatic; Eyes: EOMI, PERRL, No scleral icterus; ENMT: Mouth and pharynx normal, Mucous membranes dry; Neck: Supple, Full range of motion, No lymphadenopathy; Cardiovascular: Tachycardic rate and irregular irregular rhythm, No gallop; Respiratory: Breath sounds coarse & equal bilaterally, No wheezes.  Speaking full sentences with ease, Normal respiratory effort/excursion; Chest: Nontender, Movement normal; Abdomen: Soft, Nontender, Nondistended, Normal bowel sounds; Genitourinary: No CVA tenderness; Extremities: Pulses normal, No tenderness, +3 bilat LE edema with chronic stasis changes, no calf asymmetry.; Neuro: AA&Ox3, vague historian. Major CN grossly intact.  Speech clear. No gross focal motor or sensory deficits in extremities.; Skin: Color normal, Warm, Dry.   ED Course  Procedures     EKG  Interpretation   Date/Time:  Thursday Mar 15 2014 13:18:36 EDT Ventricular Rate:  146 PR Interval:    QRS Duration: 116 QT Interval:  330 QTC Calculation: 514 R Axis:   92 Text Interpretation:  Atrial fibrillation with rapid ventricular response  RBBB and LPFB When compared with ECG of 01/28/2014 Rate faster Confirmed by  [facility]  MD, Nunzio Cory [id number]) on 03/15/2014 1:40:33 PM      MDM  MDM Reviewed: previous chart, nursing note and vitals Reviewed previous: labs and ECG Interpretation: labs, ECG and x-ray Total time providing critical care: 30-74 minutes. This excludes time spent performing separately reportable procedures and services. Consults: cardiology   CRITICAL CARE Performed by: Alfonzo Feller Total critical care time: 40 Critical care time was exclusive of separately billable procedures and treating other patients. Critical care was necessary to treat or  prevent imminent or life-threatening deterioration. Critical care was time spent personally by me on the following activities: development of treatment plan with patient and/or surrogate as well as nursing, discussions with consultants, evaluation of patient's response to treatment, examination of patient, obtaining history from patient or surrogate, ordering and performing treatments and interventions, ordering and review of laboratory studies, ordering and review of radiographic studies, pulse oximetry and re-evaluation of patient's condition.  Results for orders placed during the hospital encounter of [id number]  BASIC METABOLIC PANEL      Result Value Ref Range   Sodium 141  137 - 147 mEq/L   Potassium 4.8  3.7 - 5.3 mEq/L   Chloride 102  96 - 112 mEq/L   CO2 21  19 - 32 mEq/L   Glucose, Bld 102 (*) 70 - 99 mg/dL   BUN 35 (*) 6 - 23 mg/dL   Creatinine, Ser 1.24 (*) 0.50 - 1.10 mg/dL   Calcium 8.9  8.4 - 10.5 mg/dL   GFR calc non Af Amer 41 (*) >90 mL/min   GFR calc Af Amer 47 (*) >90 mL/min  CBC WITH  DIFFERENTIAL      Result Value Ref Range   WBC 18.8 (*) 4.0 - 10.5 K/uL   RBC 4.85  3.87 - 5.11 MIL/uL   Hemoglobin 14.6  12.0 - 15.0 g/dL   HCT 43.3  36.0 - 46.0 %   MCV 89.3  78.0 - 100.0 fL   MCH 30.1  26.0 - 34.0 pg   MCHC 33.7  30.0 - 36.0 g/dL   RDW 15.2  11.5 - 15.5 %   Platelets 194  150 - 400 K/uL   Neutrophils Relative % 94 (*) 43 - 77 %   Neutro Abs 17.8 (*) 1.7 - 7.7 K/uL   Lymphocytes Relative 2 (*) 12 - 46 %   Lymphs Abs 0.3 (*) 0.7 - 4.0 K/uL   Monocytes Relative 4  3 - 12 %   Monocytes Absolute 0.7  0.1 - 1.0 K/uL   Eosinophils Relative 0  0 - 5 %   Eosinophils Absolute 0.0  0.0 - 0.7 K/uL   Basophils Relative 0  0 - 1 %   Basophils Absolute 0.0  0.0 - 0.1 K/uL  TROPONIN I      Result Value Ref Range   Troponin I <0.30  <0.30 ng/mL  PRO B NATRIURETIC PEPTIDE      Result Value Ref Range   Pro B Natriuretic peptide (BNP) 3771.0 (*) 0 - 450 pg/mL  PROTIME-INR      Result Value Ref Range   Prothrombin Time 14.9  11.6 - 15.2 seconds   INR 1.20  0.00 - 1.49  URINALYSIS, ROUTINE W REFLEX MICROSCOPIC      Result Value Ref Range   Color, Urine AMBER (*) YELLOW   APPearance TURBID (*) CLEAR   Specific Gravity, Urine 1.018  1.005 - 1.030   pH 5.0  5.0 - 8.0   Glucose, UA NEGATIVE  NEGATIVE mg/dL   Hgb urine dipstick MODERATE (*) NEGATIVE   Bilirubin Urine SMALL (*) NEGATIVE   Ketones, ur NEGATIVE  NEGATIVE mg/dL   Protein, ur NEGATIVE  NEGATIVE mg/dL   Urobilinogen, UA 0.2  0.0 - 1.0 mg/dL   Nitrite NEGATIVE  NEGATIVE   Leukocytes, UA LARGE (*) NEGATIVE  URINE MICROSCOPIC-ADD ON      Result Value Ref Range   Squamous Epithelial / LPF RARE  RARE   WBC, UA TOO NUMEROUS TO  COUNT  <3 WBC/hpf   RBC / HPF 3-6  <3 RBC/hpf   Bacteria, UA MANY (*) RARE   Dg Chest Port 1 View 03/15/2014   CLINICAL DATA:  Shortness of breath and weakness.  EXAM: PORTABLE CHEST - 1 VIEW  COMPARISON:  DG CHEST 1 VIEW dated 01/28/2014  FINDINGS: Cardiac silhouette remains at least moderately  enlarged, mediastinal silhouette is nonsuspicious. Diffuse mild interstitial prominence with central pulmonary vasculature congestion. Strandy densities in lung bases, with probable trace pleural effusions. No pneumothorax.  Multiple EKG lines overlie the patient and may obscure subtle underlying pathology. Degenerative change of the right shoulder. Mild degenerative changes thoracic spine.  IMPRESSION: Stable cardiomegaly and interstitial pulmonary edema with bibasilar atelectasis with suspected trace pleural effusions.   Electronically Signed   By: Elon Alas   On: 03/15/2014 14:10     1450:   IV cardizem bolus and gtt started on pt's arrival with HR trending downward to 130's-140's. 2nd IV cardizem bolus given and gtt increased with HR trending downward to 120-130's. T/C to Cards Suanne Marker, case discussed, including:  HPI, pertinent PM/SHx, VS/PE, dx testing, ED course and treatment:  Agreeable to come to ED for eval to admit.   1530:  HR continues 120-140's. D/C IV cardizem, start IV amiodarone. Cards at bedside.   1655:  HR trending down to 120-130's while on IV amiodarone gtt. Cards has consulted, requests to admit to medicine service. +UTI, UC pending; will dose IV rocephin. T/C to [facility] Dr. Eliseo Squires, case discussed, including:  HPI, pertinent PM/SHx, VS/PE, dx testing, ED course and treatment:  Agreeable to admit, requests to write temporary orders, obtain stepdown bed to team 10.      Alfonzo Feller, DO 03/17/14 1438

## 2014-03-15 NOTE — ED Notes (Signed)
Husband number-[id number]

## 2014-03-16 ENCOUNTER — Inpatient Hospital Stay (HOSPITAL_COMMUNITY): Payer: Medicare Other

## 2014-03-16 DIAGNOSIS — L03119 Cellulitis of unspecified part of limb: Secondary | ICD-10-CM

## 2014-03-16 DIAGNOSIS — N183 Chronic kidney disease, stage 3 unspecified: Secondary | ICD-10-CM

## 2014-03-16 DIAGNOSIS — Z66 Do not resuscitate: Secondary | ICD-10-CM | POA: Diagnosis present

## 2014-03-16 DIAGNOSIS — N390 Urinary tract infection, site not specified: Secondary | ICD-10-CM

## 2014-03-16 DIAGNOSIS — I959 Hypotension, unspecified: Secondary | ICD-10-CM

## 2014-03-16 DIAGNOSIS — G8929 Other chronic pain: Secondary | ICD-10-CM

## 2014-03-16 DIAGNOSIS — J9601 Acute respiratory failure with hypoxia: Secondary | ICD-10-CM | POA: Diagnosis present

## 2014-03-16 DIAGNOSIS — M7989 Other specified soft tissue disorders: Secondary | ICD-10-CM

## 2014-03-16 DIAGNOSIS — L02419 Cutaneous abscess of limb, unspecified: Secondary | ICD-10-CM

## 2014-03-16 DIAGNOSIS — A419 Sepsis, unspecified organism: Secondary | ICD-10-CM | POA: Diagnosis present

## 2014-03-16 DIAGNOSIS — I5022 Chronic systolic (congestive) heart failure: Secondary | ICD-10-CM

## 2014-03-16 DIAGNOSIS — F411 Generalized anxiety disorder: Secondary | ICD-10-CM

## 2014-03-16 DIAGNOSIS — J9600 Acute respiratory failure, unspecified whether with hypoxia or hypercapnia: Secondary | ICD-10-CM

## 2014-03-16 DIAGNOSIS — E1159 Type 2 diabetes mellitus with other circulatory complications: Secondary | ICD-10-CM

## 2014-03-16 DIAGNOSIS — E876 Hypokalemia: Secondary | ICD-10-CM

## 2014-03-16 LAB — COMPREHENSIVE METABOLIC PANEL
ALK PHOS: 107 U/L (ref 39–117)
ALT: 9 U/L (ref 0–35)
AST: 32 U/L (ref 0–37)
Albumin: 1.5 g/dL — ABNORMAL LOW (ref 3.5–5.2)
BUN: 42 mg/dL — ABNORMAL HIGH (ref 6–23)
CALCIUM: 7.8 mg/dL — AB (ref 8.4–10.5)
CO2: 21 mEq/L (ref 19–32)
Chloride: 100 mEq/L (ref 96–112)
Creatinine, Ser: 1.37 mg/dL — ABNORMAL HIGH (ref 0.50–1.10)
GFR calc Af Amer: 42 mL/min — ABNORMAL LOW (ref >90)
GFR calc non Af Amer: 36 mL/min — ABNORMAL LOW (ref >90)
Glucose, Bld: 146 mg/dL — ABNORMAL HIGH (ref 70–99)
POTASSIUM: 4.8 meq/L (ref 3.7–5.3)
Sodium: 133 mEq/L — ABNORMAL LOW (ref 137–147)
Total Bilirubin: 0.6 mg/dL (ref 0.3–1.2)
Total Protein: 4.6 g/dL — ABNORMAL LOW (ref 6.0–8.3)

## 2014-03-16 LAB — CBC
HCT: 36.4 % (ref 36.0–46.0)
Hemoglobin: 11.9 g/dL — ABNORMAL LOW (ref 12.0–15.0)
MCH: 29.5 pg (ref 26.0–34.0)
MCHC: 32.7 g/dL (ref 30.0–36.0)
MCV: 90.3 fL (ref 78.0–100.0)
Platelets: 165 10*3/uL (ref 150–400)
RBC: 4.03 MIL/uL (ref 3.87–5.11)
RDW: 15.5 % (ref 11.5–15.5)
WBC: 18.3 10*3/uL — ABNORMAL HIGH (ref 4.0–10.5)

## 2014-03-16 LAB — LACTIC ACID, PLASMA
LACTIC ACID, VENOUS: 1.8 mmol/L (ref 0.5–2.2)
LACTIC ACID, VENOUS: 1.8 mmol/L (ref 0.5–2.2)

## 2014-03-16 LAB — SODIUM, URINE, RANDOM: Sodium, Ur: <20 mEq/L

## 2014-03-16 LAB — BASIC METABOLIC PANEL
BUN: 39 mg/dL — ABNORMAL HIGH (ref 6–23)
CALCIUM: 7.8 mg/dL — AB (ref 8.4–10.5)
CO2: 20 mEq/L (ref 19–32)
Chloride: 103 mEq/L (ref 96–112)
Creatinine, Ser: 1.34 mg/dL — ABNORMAL HIGH (ref 0.50–1.10)
GFR calc Af Amer: 43 mL/min — ABNORMAL LOW (ref >90)
GFR, EST NON AFRICAN AMERICAN: 37 mL/min — AB (ref >90)
GLUCOSE: 95 mg/dL (ref 70–99)
Potassium: 5.2 mEq/L (ref 3.7–5.3)
SODIUM: 137 meq/L (ref 137–147)

## 2014-03-16 LAB — TROPONIN I
Troponin I: <0.3 ng/mL (ref <0.30)
Troponin I: <0.3 ng/mL (ref <0.30)

## 2014-03-16 LAB — PROTIME-INR
INR: 1.72 — ABNORMAL HIGH (ref 0.00–1.49)
Prothrombin Time: 19.7 seconds — ABNORMAL HIGH (ref 11.6–15.2)

## 2014-03-16 LAB — MRSA PCR SCREENING: MRSA by PCR: POSITIVE — AB

## 2014-03-16 LAB — TSH: TSH: 0.667 u[IU]/mL (ref 0.350–4.500)

## 2014-03-16 LAB — CK: CK TOTAL: 58 U/L (ref 7–177)

## 2014-03-16 LAB — GLUCOSE, CAPILLARY: Glucose-Capillary: 72 mg/dL (ref 70–99)

## 2014-03-16 MED ORDER — LINEZOLID 2 MG/ML IV SOLN: 600.0000 mg | Freq: Two times a day (BID) | INTRAVENOUS | Status: DC

## 2014-03-16 MED ORDER — LORAZEPAM 2 MG/ML IJ SOLN
0.5000 mg | Freq: Once | INTRAMUSCULAR | Status: AC
  Administered 2014-03-16: 0.5 mg via INTRAVENOUS
  Filled 2014-03-16: qty 1

## 2014-03-16 MED ORDER — SODIUM CHLORIDE 0.9 % IV BOLUS (SEPSIS)
500.0000 mL | Freq: Once | INTRAVENOUS | Status: AC
  Administered 2014-03-16: 500 mL via INTRAVENOUS

## 2014-03-16 MED ORDER — SODIUM CHLORIDE 0.9 % IV SOLN
495.0000 mg | INTRAVENOUS | Status: DC
  Administered 2014-03-16 – 2014-03-20 (×5): 495 mg via INTRAVENOUS
  Filled 2014-03-16 (×7): qty 9.9

## 2014-03-16 MED ORDER — DIGOXIN 0.25 MG/ML IJ SOLN
0.1250 mg | Freq: Every day | INTRAMUSCULAR | Status: DC
  Administered 2014-03-16 – 2014-03-20 (×5): 0.125 mg via INTRAVENOUS
  Filled 2014-03-16 (×6): qty 0.5

## 2014-03-16 MED ORDER — SODIUM CHLORIDE 0.9 % IV SOLN
INTRAVENOUS | Status: DC
  Administered 2014-03-16: 100 mL/h via INTRAVENOUS
  Administered 2014-03-17 – 2014-03-20 (×3): via INTRAVENOUS

## 2014-03-16 MED ORDER — INSULIN ASPART 100 UNIT/ML ~~LOC~~ SOLN: 0.0000 [IU] | Freq: Every day | SUBCUTANEOUS | Status: DC

## 2014-03-16 MED ORDER — ONDANSETRON HCL 4 MG/2ML IJ SOLN: 4.0000 mg | Freq: Four times a day (QID) | INTRAMUSCULAR | Status: DC | PRN

## 2014-03-16 MED ORDER — CHLORHEXIDINE GLUCONATE CLOTH 2 % EX PADS
6.0000 | MEDICATED_PAD | Freq: Every day | CUTANEOUS | Status: AC
  Administered 2014-03-16 – 2014-03-20 (×5): 6 via TOPICAL

## 2014-03-16 MED ORDER — MUPIROCIN 2 % EX OINT
1.0000 "application " | TOPICAL_OINTMENT | Freq: Two times a day (BID) | CUTANEOUS | Status: AC
  Administered 2014-03-16 – 2014-03-20 (×10): 1 via NASAL
  Filled 2014-03-16 (×2): qty 22

## 2014-03-16 MED ORDER — OXYCODONE HCL 5 MG PO TABS
5.0000 mg | ORAL_TABLET | ORAL | Status: DC | PRN
  Administered 2014-03-17 – 2014-03-20 (×7): 5 mg via ORAL
  Filled 2014-03-16 (×8): qty 1

## 2014-03-16 MED ORDER — GLUCERNA SHAKE PO LIQD
237.0000 mL | Freq: Two times a day (BID) | ORAL | Status: DC
  Administered 2014-03-16 – 2014-03-20 (×9): 237 mL via ORAL
  Filled 2014-03-16: qty 237

## 2014-03-16 MED ORDER — MORPHINE SULFATE 2 MG/ML IJ SOLN
1.0000 mg | INTRAMUSCULAR | Status: DC | PRN
  Administered 2014-03-16: 1 mg via INTRAVENOUS
  Filled 2014-03-16: qty 1

## 2014-03-16 MED ORDER — INSULIN ASPART 100 UNIT/ML ~~LOC~~ SOLN
0.0000 [IU] | Freq: Three times a day (TID) | SUBCUTANEOUS | Status: DC
  Administered 2014-03-18 – 2014-03-20 (×3): 1 [IU] via SUBCUTANEOUS

## 2014-03-16 NOTE — Progress Notes (Signed)
ANTIBIOTIC CONSULT NOTE - FOLLOW UP  Pharmacy Consult for daptomycin and cefepime Indication: cellulitis/UTI/decub ulcer  No Known Allergies  Patient Measurements: Height: 5\' 8"  (172.7 cm) Weight: 272 lb 14.9 oz (123.8 kg) IBW/kg (Calculated) : 63.9  Vital Signs: Temp: 98.3 F (36.8 C) (05/08 0856) Temp src: Oral (05/08 0856) BP: 85/55 mmHg (05/08 0856) Pulse Rate: 112 (05/08 0856) Intake/Output from previous day: 05/07 0701 - 05/08 0700 In: 410 [P.O.:360; IV Piggyback:50] Out: 575 [Urine:575] Intake/Output from this shift: Total I/O In: -  Out: 50 [Urine:50]  Labs:  Recent Labs  03/15/14 1331 03/16/14 0345  WBC 18.8* 18.3*  HGB 14.6 11.9*  PLT 194 165  CREATININE 1.24* 1.34*   Estimated Creatinine Clearance: 48.8 ml/min (by C-G formula based on Cr of 1.34). No results found for this basename: VANCOTROUGH, VANCOPEAK, VANCORANDOM, GENTTROUGH, GENTPEAK, GENTRANDOM, TOBRATROUGH, TOBRAPEAK, TOBRARND, AMIKACINPEAK, AMIKACINTROU, AMIKACIN,  in the last 72 hours   Microbiology: Recent Results (from the past 720 hour(s))  CULTURE, BLOOD (ROUTINE X 2)     Status: None   Collection Time    03/15/14  3:39 PM      Result Value Ref Range Status   Specimen Description BLOOD RIGHT ARM   Final   Special Requests BOTTLES DRAWN AEROBIC AND ANAEROBIC 5CCS   Final   Culture  Setup Time     Final   Value: 03/15/2014 20:47     Performed at [facility]   Culture     Final   Value:        BLOOD CULTURE RECEIVED NO GROWTH TO DATE CULTURE WILL BE HELD FOR 5 DAYS BEFORE ISSUING A FINAL NEGATIVE REPORT     Performed at [facility]   Report Status PENDING   Incomplete  CULTURE, BLOOD (ROUTINE X 2)     Status: None   Collection Time    03/15/14  3:46 PM      Result Value Ref Range Status   Specimen Description BLOOD LEFT HAND   Final   Special Requests BOTTLES DRAWN AEROBIC ONLY 5CC   Final   Culture  Setup Time     Final   Value: 03/15/2014 20:48     Performed at  [facility]   Culture     Final   Value:        BLOOD CULTURE RECEIVED NO GROWTH TO DATE CULTURE WILL BE HELD FOR 5 DAYS BEFORE ISSUING A FINAL NEGATIVE REPORT     Performed at [facility]   Report Status PENDING   Incomplete  MRSA PCR SCREENING     Status: Abnormal   Collection Time    03/15/14 11:06 PM      Result Value Ref Range Status   MRSA by PCR POSITIVE (*) NEGATIVE Final   Comment:            The GeneXpert MRSA Assay (FDA     approved for NASAL specimens     only), is one component of a     comprehensive MRSA colonization     surveillance program. It is not     intended to diagnose MRSA     infection nor to guide or     monitor treatment for     MRSA infections.     RESULT CALLED TO, READ BACK BY AND VERIFIED WITH:     M.EVANGALISTA,RN AT 0044 BY L.PITT 03/16/14    Anti-infectives   Start     Dose/Rate Route Frequency Ordered  Stop   03/16/14 1200  DAPTOmycin (CUBICIN) 495 mg in sodium chloride 0.9 % IVPB     495 mg 219.8 mL/hr over 30 Minutes Intravenous Every 24 hours 03/16/14 1005     03/16/14 1000  linezolid (ZYVOX) IVPB 600 mg  Status:  Discontinued     600 mg 300 mL/hr over 60 Minutes Intravenous Every 12 hours 03/16/14 0949 03/16/14 0955   03/16/14 0800  vancomycin (VANCOCIN) IVPB 1000 mg/200 mL premix  Status:  Discontinued     1,000 mg 200 mL/hr over 60 Minutes Intravenous Every 12 hours 03/15/14 1833 03/16/14 0949   03/16/14 0300  ceFEPIme (MAXIPIME) 1 g in dextrose 5 % 50 mL IVPB     1 g 100 mL/hr over 30 Minutes Intravenous Every 8 hours 03/15/14 1833     03/15/14 1830  ceFEPIme (MAXIPIME) 2 g in dextrose 5 % 50 mL IVPB     2 g 100 mL/hr over 30 Minutes Intravenous  Once 03/15/14 1827 03/15/14 1928   03/15/14 1830  vancomycin (VANCOCIN) 2,000 mg in sodium chloride 0.9 % 500 mL IVPB     2,000 mg 250 mL/hr over 120 Minutes Intravenous  Once 03/15/14 1827 03/15/14 2214   03/15/14 1700  cefTRIAXone (ROCEPHIN) 1 g in dextrose 5 % 50 mL IVPB   Status:  Discontinued     1 g 100 mL/hr over 30 Minutes Intravenous  Once 03/15/14 1656 03/15/14 1826      Assessment: 80 y/o obese female to begin daptomycin for cellulitis/decubitus ulcers. Spoke with Erin Hearing and she got approval from Dr. Johnnye Sima to use daptomycin due to poor kidney function. She continues on cefepime also. She is febrile with a Tmax of 101.2 and WBC are elevated. SCr is 1.34, est CrCl 48 ml/min.  Vanc 5/7>>5/8 Cefepime 5/7>> Dapto 5/8>>  5/7 BCx2 - ngtd 5/7 Urine - sent MRSA PCR +  Goal of Therapy:  Eradication of infection  Plan:  - Continue cefepime 1 g IV q8h - Daptomycin 495 mg IV q24h - Baseline CK added to labs to be drawn shortly - Weekly CK while on daptomycin - Monitor renal function, clinical course, and culture data  [facility], Brady.D., BCPS Clinical Pharmacist Pager: [phone] 03/16/2014 10:10 AM

## 2014-03-16 NOTE — Progress Notes (Signed)
Clinical Social Work Department BRIEF PSYCHOSOCIAL ASSESSMENT 03/16/2014  Patient:  Tara Huff, Tara Huff     Account Number:  [id number]     [place] date:  03/15/2014  Clinical Social Worker:  Freeman Caldron  Date/Time:  03/16/2014 03:52 PM  Referred by:  Physician  Date Referred:  03/16/2014 Referred for  SNF Placement   Other Referral:   Interview type:  Patient Other interview type:    PSYCHOSOCIAL DATA Living Status:  FAMILY Admitted from facility:   Level of care:   Primary support name:  Ainslee Sou ([phone]) Primary support relationship to patient:  SPOUSE Degree of support available:   Good--pt has support from spouse.    CURRENT CONCERNS Current Concerns  Post-Acute Placement   Other Concerns:    SOCIAL WORK ASSESSMENT / PLAN Pt states she was at [facility] but they sent her home before she was ready. Pt states she does not want to return to this facility. Pt also states she has been to [facility] and would not like to return. Has tried to go to [facility] in the past but they have not been able to offer. CSW made referrals to all SNFs in [facility] except [place] [facility] and [facility] Explained to pt that if insurance days are up, this would be an issue at all facilities. Pt understanding of this. CSW following.   Assessment/plan status:  Psychosocial Support/Ongoing Assessment of Needs Other assessment/ plan:   Information/referral to community resources:   SNF?    PATIENT'S/FAMILY'S RESPONSE TO PLAN OF CARE: Good--pt sleeping when CSW came to room and eyes closed throughout most of conversation. Pt expressed understanding of CSW role.       Ky Barban, MSW, [facility] Clinical Social Worker [phone]

## 2014-03-16 NOTE — Progress Notes (Signed)
UR completed. Guss Bunde RN CCM Case Mgmt phone [phone]

## 2014-03-16 NOTE — Progress Notes (Addendum)
INITIAL NUTRITION ASSESSMENT  DOCUMENTATION CODES Per approved criteria  -Morbid Obesity   INTERVENTION: Glucerna Shake po BID, each supplement provides 350 kcal and 13 grams of protein RD to follow for nutrition care plan  NUTRITION DIAGNOSIS: Increased nutrient needs related to wound healing as evidenced by estimated nutrition needs  Goal: Pt to meet >/= 90% of their estimated nutrition needs   Monitor:  PO & supplemental intake, weight, labs, I/O's  Reason for Assessment: Malnutrition Screening Tool Report, Low Braden  80 y.o. female  Admitting Dx: fatigue   ASSESSMENT: 80 y.o. Female with PMH of HTN, DM, CHF, carcinoma of endometrium and morbid obesity; presented to hospital after feeling tired, was found by EMS to be tachycardic with a rate of 200 -- given Cardizem and was found to be in A fib in the ER; pt spends the entire day in a wheelchair.  Patient resting in bed; per RN patient in a lot of pain; currently on a Full Liquid diet; no % PO intake records available; CWOCN note reviewed -- pt R posterior thigh wound and deep tissue injuries to 2 locations on buttocks; would benefit from addition of oral nutrition supplements; RD to order.  Low braden score places patient at risk for skin breakdown.  No muscle or subcutaneous fat depletion noticed.  Height: Ht Readings from Last 1 Encounters:  03/15/14 5\' 8"  (1.727 m)    Weight: Wt Readings from Last 1 Encounters:  03/16/14 272 lb 14.9 oz (123.8 kg)    Ideal Body Weight: 140 lb  % Ideal Body Weight: 194%  Wt Readings from Last 10 Encounters:  03/16/14 272 lb 14.9 oz (123.8 kg)  01/28/14 265 lb (120.203 kg)  01/15/14 265 lb (120.203 kg)  01/11/14 268 lb 3.2 oz (121.655 kg)  01/03/14 259 lb 3.2 oz (117.572 kg)  12/29/13 274 lb 6.4 oz (124.467 kg)  12/25/13 282 lb 6.4 oz (128.096 kg)  12/22/13 279 lb (126.554 kg)  12/18/13 274 lb (124.286 kg)  12/15/13 260 lb (117.935 kg)    Usual Body Weight: 265  lb  % Usual Body Weight: 102%  BMI:  Body mass index is 41.51 kg/(m^2).  Estimated Nutritional Needs: Kcal: 1700-1900 Protein: 80-90 gm Fluid: 1.7-1.9 L  Skin: chronic full thickness wound to R posterior thigh           deep tissue injuries to 2 locations on buttocks  Diet Order: Full Liquid  EDUCATION NEEDS: -No education needs identified at this time   Intake/Output Summary (Last 24 hours) at 03/16/14 1408 Last data filed at 03/16/14 1300  Gross per 24 hour  Intake  959.9 ml  Output    665 ml  Net  294.9 ml    Labs:   Recent Labs Lab 03/15/14 1331 03/16/14 0345 03/16/14 1015  NA 141 137 133*  K 4.8 5.2 4.8  CL 102 103 100  CO2 21 20 21   BUN 35* 39* 42*  CREATININE 1.24* 1.34* 1.37*  CALCIUM 8.9 7.8* 7.8*  GLUCOSE 102* 95 146*    CBG (last 3)   Recent Labs  03/15/14 2353  GLUCAP 117*    Scheduled Meds: . ceFEPime (MAXIPIME) IV  1 g Intravenous Q8H  . Chlorhexidine Gluconate Cloth  6 each Topical Q0600  . DAPTOmycin (CUBICIN)  IV  495 mg Intravenous Q24H  . digoxin  0.125 mg Intravenous Daily  . heparin  5,000 Units Subcutaneous 3 times per day  . insulin aspart  0-5 Units Subcutaneous QHS  .  insulin aspart  0-9 Units Subcutaneous TID WC  . mupirocin ointment  1 application Nasal BID  . sodium chloride  3 mL Intravenous Q12H    Continuous Infusions: . sodium chloride 100 mL/hr (03/16/14 9417)    Past Medical History  Diagnosis Date  . Hypertension   . Atrial fibrillation   . MI (myocardial infarction)   . Anxiety   . Hypercholesterolemia   . Shoulder pain   . Sleep apnea     intolerant to CPAP  . Type 2 diabetes mellitus   . OSA (obstructive sleep apnea)   . Hypertensive heart disease   . Morbid obesity   . Pyogenic granuloma   . Cancer     cervical  . Depression   . Pneumonia   . CHF (congestive heart failure)   . Cardiac arrest 11/14/2013  . Complete heart block 11/14/2013  . OSTEOMYELITIS 04/28/2010    L great Toe      .  Carcinoma of endometrium 11/20/2013    Past Surgical History  Procedure Laterality Date  . Knee surgery      at age 30  . Cataract surgery      both eyes  . Abdominal hysterectomy    . Toe amputation      Arthur Holms, RD, LDN Pager #: [phone] After-Hours Pager #: [phone]

## 2014-03-16 NOTE — Progress Notes (Signed)
Rapid rate in setting of chronic a fibrillation implies an underlying stressor that is driving the heart rate. I suggest digitalization as only agent that can currently be helpful. Please call if we can help further.

## 2014-03-16 NOTE — Progress Notes (Signed)
Patient Name: Tara Huff Date of Encounter: 03/16/2014  Active Problems:   Sleep apnea   Debility   Carcinoma of endometrium   CKD (chronic kidney disease), stage III   Type II or unspecified type diabetes mellitus with peripheral circulatory disorders, uncontrolled(250.72)   UTI (lower urinary tract infection)   Atrial fibrillation with rapid ventricular response   Cellulitis   Acute on chronic systolic heart failure   Decubitus skin ulcer    Patient Profile: 80 yo female w/  PAF, chronic kidney disease stage III, UTI, anxiety, diabetes, diastolic CHF, admitted 97/02 w/ UTI, LE Cellulitis, CHF, and rapid afib. Cardiology seeing for the atrial fib.   SUBJECTIVE: Pt somnolent, rouses to verbal, oriented to name. Denies chest pain or SOB.  OBJECTIVE Filed Vitals:   03/16/14 0400 03/16/14 0500 03/16/14 0856 03/16/14 1114  BP: 87/55  85/55 87/48  Pulse: 114  112 112  Temp: 98.3 F (36.8 C)  98.3 F (36.8 C) 98.3 F (36.8 C)  TempSrc: Oral  Oral Axillary  Resp: 28  25 30   Height:      Weight:  272 lb 14.9 oz (123.8 kg)    SpO2: 95%  97% 96%    Intake/Output Summary (Last 24 hours) at 03/16/14 1143 Last data filed at 03/16/14 1115  Gross per 24 hour  Intake    410 ml  Output    665 ml  Net   -255 ml   Filed Weights   03/15/14 2300 03/16/14 0500  Weight: 272 lb 14.9 oz (123.8 kg) 272 lb 14.9 oz (123.8 kg)    PHYSICAL EXAM General: Well developed, well nourished, female in no acute distress. Head: Normocephalic, atraumatic.  Neck: Supple without bruits, JVD elevated. Lungs:  Resp regular and unlabored, rales bases. Heart: RRR, S1, S2, no S3, S4, or murmur; no rub. Abdomen: Soft, non-tender, non-distended, BS decreased.  Extremities: No clubbing, cyanosis, LE edema and erythema.  Neuro: Alert and oriented X 3. Moves all extremities spontaneously. Psych: Normal affect.  LABS: CBC: Recent Labs  03/15/14 1331 03/16/14 0345  WBC 18.8* 18.3*  NEUTROABS  17.8*  --   HGB 14.6 11.9*  HCT 43.3 36.4  MCV 89.3 90.3  PLT 194 165   INR: Recent Labs  03/16/14 1015  INR 6.37*   Basic Metabolic Panel: Recent Labs  03/16/14 0345 03/16/14 1015  NA 137 133*  K 5.2 4.8  CL 103 100  CO2 20 21  GLUCOSE 95 146*  BUN 39* 42*  CREATININE 1.34* 1.37*  CALCIUM 7.8* 7.8*   Liver Function Tests: Recent Labs  03/16/14 1015  AST 32  ALT 9  ALKPHOS 107  BILITOT 0.6  PROT 4.6*  ALBUMIN 1.5*   Cardiac Enzymes: Recent Labs  03/15/14 1331 03/16/14 0345 03/16/14 1015  CKTOTAL  --   --  68  TROPONINI <0.30 <0.30 <0.30   BNP: Pro B Natriuretic peptide (BNP)  Date/Time Value Ref Range Status  03/15/2014  1:31 PM 3771.0* 0 - 450 pg/mL Final  11/14/2013 10:02 AM 1616.0* 0 - 450 pg/mL Final   Thyroid Function Tests: Recent Labs  03/15/14 0035  TSH 0.667   TELE:   Rapid atrial fib  Radiology/Studies: Dg Chest Port 1 View 03/15/2014   CLINICAL DATA:  Shortness of breath and weakness.  EXAM: PORTABLE CHEST - 1 VIEW  COMPARISON:  DG CHEST 1 VIEW dated 01/28/2014  FINDINGS: Cardiac silhouette remains at least moderately enlarged, mediastinal silhouette is nonsuspicious. Diffuse mild  interstitial prominence with central pulmonary vasculature congestion. Strandy densities in lung bases, with probable trace pleural effusions. No pneumothorax.  Multiple EKG lines overlie the patient and may obscure subtle underlying pathology. Degenerative change of the right shoulder. Mild degenerative changes thoracic spine.  IMPRESSION: Stable cardiomegaly and interstitial pulmonary edema with bibasilar atelectasis with suspected trace pleural effusions.   Electronically Signed   By: Elon Alas   On: 03/15/2014 14:10    Current Medications:  . ceFEPime (MAXIPIME) IV  1 g Intravenous Q8H  . Chlorhexidine Gluconate Cloth  6 each Topical Q0600  . DAPTOmycin (CUBICIN)  IV  495 mg Intravenous Q24H  . heparin  5,000 Units Subcutaneous 3 times per day  .  mupirocin ointment  1 application Nasal BID  . sodium chloride  3 mL Intravenous Q12H   . sodium chloride 100 mL/hr (03/16/14 0906)    ASSESSMENT AND PLAN:   Atrial fibrillation with rapid ventricular response - feel this is secondary to other multiple medical problems. BP too low for rate-lowering medications. Had started amio last pm, it was discontinued. MD advise if we should continue to follow her or if cardiology should see again PRN.  Active Problems:   Sleep apnea - per IM    Debility - per IM    Carcinoma of endometrium - per IM    CKD (chronic kidney disease), stage III - at baseline, per IM    Type II or unspecified type diabetes mellitus with peripheral circulatory disorders, uncontrolled(250.72) - per IM    UTI (lower urinary tract infection) - per IM    Cellulitis - per IM    Acute on chronic systolic heart failure - weight 259.2 lbs in SNF on 02/25, now 272.93. Her BP is too low for diuresis. Albumin very low at 1.5, will make volume management more difficult. Med changes per MD.    Decubitus skin ulcer - per IM, [place]    DNR - per IM  Signed, Lonn Georgia , PA-C 11:43 AM 03/16/2014

## 2014-03-16 NOTE — Progress Notes (Addendum)
Bilateral lower extremity venous duplex completed.  Right:  No evidence of DVT or Baker's cyst.  There appears to be superficial thrombus in the right greater saphenous vein.  Left:  No evidence of DVT, superficial thrombosis, or Baker's cyst.  Technically difficult study due to the patient's body habitus.

## 2014-03-16 NOTE — Progress Notes (Signed)
Pt taken to MRI via bed. Pt has [place] at 2 liters intact.

## 2014-03-16 NOTE — Consult Note (Addendum)
WOC wound consult note Reason for Consult: Consult requested for right posterior thigh.  Pt familiar to [facility] team from previous admission; refer to progress notes from 1/15 Wound type: Chronic full thickness wound Pressure Ulcer POA: This is NOT a pressure ulcer, it was present on admission. Pt has deep tissue injuries to 2 locations on buttocks which were also present on admission. Measurement: Upper buttocks with 2 dark purple deep tissue injuries, each .2X.2cm  Right posterior thigh 8X6X.1cm  Wound bed: Patchy areas with dark red moist wound bed and shaggy boarders; appearance consistent with constant shear and moisture.  No improvement in wound since previous assessment in January.  Pt cannot state the etiology of this wound.  She is frequently incontinent of stool and it is difficult to keep wound from becoming soiled. Pt has high BMI and is immobile. Drainage (amount, consistency, odor) Small amt pink drainage, no odor. Periwound: Intact skin surrounding. Dressing procedure/placement/frequency: Foam dressing to right posterior thigh to protect and absorb drainage. Please re-consult if further assistance is needed.  Thank-you,  Julien Girt MSN, [place], [place], [place], [place]

## 2014-03-16 NOTE — Progress Notes (Signed)
Paged the NP to let her know that the pt has had several low bps. NP said to continue to watch it. No new orders at this time.

## 2014-03-16 NOTE — Progress Notes (Signed)
[facility]  Tara Huff XBM:[id number] DOB: 05-01-1934 DOA: 03/15/2014 PCP: Reginia Naas, MD  Time spent :  Brief narrative: 80 y.o. female who presented from home. She was recently discharged from [facility]. Her husband has been caring for her. At baseline, she requires help getting out of bed, she spends the entire day in a wheelchair. She wears a diaper and has decubitus ulcers.   She presented to the hospital after feeling tired, she was found by EMS to be tachycardic with a rate of 200- was given adenosine without improvement. She then was given Cardizem and was found to be in a fib in the ER. She denied chest pain, no SOB, no fevers, no chills   She was seen by cardiology who started patient on amiodarone. She was also thought to be septic  Admitting MD spoke with her  husband who confirmed that patient is DNR/DNI.   HPI/Subjective: Complains of significant pain in LLE- no anbx's prior to admit and sx's ongoing for about 4 days  Assessment/Plan: Active Problems:   Atrial fibrillation with rapid ventricular response -still tachycardic but in the low 100s -required Amiodarone but now off-cont digoxin -suspect being driven by sepsis and dehydration -has underlying chronic AF    Sepsis/ Hypotension/GNR bacteremia -source appears to be LLE cellulitis in addition to ? UTI -1/2 blood cx's positive for GNRs (likely urinary source) -PCT 1.29 -lactic acid 1.8 -continue broad spectrum empiric anbx's -follow up on cx's -aggressive fluid resuscitation: IVFs at 100/hr with 500 cc bolus prn hypotension    Cellulitis of left leg -very edematous and painful with marked erythema -too painful to pursue venous duplex to r/o DVT -concerning for necrotizing soft tissue process so ck MRI leg -Oxy IR and IV MSO4 for pain    Acute respiratory failure with hypoxia:   A) Sleep apnea   B) Chronic systolic congestive heart failure, NYHA class 1   C)  PULMONARY HYPERTENSION -refusing CPAP at HS; CPAP per respiratory ordered  -HF compensated-EF per ECHO Jan ~40% -agree with repeating since unknown duration of pronounced tachycardia and may have acute tachy mediated systolic dysfunction    CKD (chronic kidney disease), stage III -baseline 43/1.23 -current function is stable -change Vanco to Cubicin to minimize acute renal dysfunction esp in concurrent hypotension    Type II or unspecified type diabetes mellitus with peripheral circulatory disorders, uncontrolled(250.72) -CBGs controlled  -HgbA1c was 6.6 in January -SSI if needed    UTI (lower urinary tract infection) -urine cx pending-note blood cx positive for GNRs -current empiric anbx's should cover GNRs    Decubitus skin ulcer -per WOC RN recs     DVT prophylaxis: SQ Heparin Code Status: DNR Family Communication: No family at bedside- husband reported to be out town in [place] (left after taking pt to ER) Disposition Plan/Expected LOS: stepdown   Consultants: None  Procedures: LE Venous duplex pending  Antibiotics: Cefepime 5/7 >>> Daptomycin 5/8 >>> Vancomycin 5/7 >>> 5/8  Objective: Blood pressure 87/48, pulse 112, temperature 98.3 F (36.8 C), temperature source Axillary, resp. rate 30, height 5\' 8"  (1.727 m), weight 272 lb 14.9 oz (123.8 kg), SpO2 96.00%.  Intake/Output Summary (Last 24 hours) at 03/16/14 1241 Last data filed at 03/16/14 1115  Gross per 24 hour  Intake    410 ml  Output    665 ml  Net   -255 ml     Exam: General: No acute respiratory distress Lungs:  Clear to auscultation bilaterally without wheezes or crackles, RA Cardiovascular: Regular rate and rhythm without murmur gallop or rub normal S1 and S2, no peripheral edema or JVD Abdomen: Nontender, nondistended, soft, bowel sounds positive, no rebound, no ascites, no appreciable mass Musculoskeletal: No significant cyanosis, clubbing of bilateral lower extremities Neurological: Alert  and oriented x 3, moves all extremities x 4 without focal neurological deficits, CN 2-12 intact  Scheduled Meds:  Scheduled Meds: . ceFEPime (MAXIPIME) IV  1 g Intravenous Q8H  . Chlorhexidine Gluconate Cloth  6 each Topical Q0600  . DAPTOmycin (CUBICIN)  IV  495 mg Intravenous Q24H  . digoxin  0.125 mg Intravenous Daily  . heparin  5,000 Units Subcutaneous 3 times per day  . mupirocin ointment  1 application Nasal BID  . sodium chloride  3 mL Intravenous Q12H   Continuous Infusions: . sodium chloride 100 mL/hr (03/16/14 0906)    Data Reviewed: Basic Metabolic Panel:  Recent Labs Lab 03/15/14 1331 03/16/14 0345 03/16/14 1015  NA 141 137 133*  K 4.8 5.2 4.8  CL 102 103 100  CO2 21 20 21   GLUCOSE 102* 95 146*  BUN 35* 39* 42*  CREATININE 1.24* 1.34* 1.37*  CALCIUM 8.9 7.8* 7.8*   Liver Function Tests:  Recent Labs Lab 03/16/14 1015  AST 32  ALT 9  ALKPHOS 107  BILITOT 0.6  PROT 4.6*  ALBUMIN 1.5*   No results found for this basename: LIPASE, AMYLASE,  in the last 168 hours No results found for this basename: AMMONIA,  in the last 168 hours CBC:  Recent Labs Lab 03/15/14 1331 03/16/14 0345  WBC 18.8* 18.3*  NEUTROABS 17.8*  --   HGB 14.6 11.9*  HCT 43.3 36.4  MCV 89.3 90.3  PLT 194 165   Cardiac Enzymes:  Recent Labs Lab 03/15/14 1331 03/16/14 0345 03/16/14 1015  CKTOTAL  --   --  10  TROPONINI <0.30 <0.30 <0.30   BNP (last 3 results)  Recent Labs  04/27/13 1733 11/14/13 1002 03/15/14 1331  PROBNP 2160.0* 1616.0* 3771.0*   CBG:  Recent Labs Lab 03/15/14 2353  GLUCAP 117*    Recent Results (from the past 240 hour(s))  CULTURE, BLOOD (ROUTINE X 2)     Status: None   Collection Time    03/15/14  3:39 PM      Result Value Ref Range Status   Specimen Description BLOOD RIGHT ARM   Final   Special Requests BOTTLES DRAWN AEROBIC AND ANAEROBIC 5CCS   Final   Culture  Setup Time     Final   Value: 03/15/2014 20:47     Performed at  [facility]   Culture     Final   Value:        BLOOD CULTURE RECEIVED NO GROWTH TO DATE CULTURE WILL BE HELD FOR 5 DAYS BEFORE ISSUING A FINAL NEGATIVE REPORT     Performed at [facility]   Report Status PENDING   Incomplete  CULTURE, BLOOD (ROUTINE X 2)     Status: None   Collection Time    03/15/14  3:46 PM      Result Value Ref Range Status   Specimen Description BLOOD LEFT HAND   Final   Special Requests BOTTLES DRAWN AEROBIC ONLY 5CC   Final   Culture  Setup Time     Final   Value: 03/15/2014 20:48     Performed at [facility]  Final   Value:        BLOOD CULTURE RECEIVED NO GROWTH TO DATE CULTURE WILL BE HELD FOR 5 DAYS BEFORE ISSUING A FINAL NEGATIVE REPORT     Performed at [facility]   Report Status PENDING   Incomplete  MRSA PCR SCREENING     Status: Abnormal   Collection Time    03/15/14 11:06 PM      Result Value Ref Range Status   MRSA by PCR POSITIVE (*) NEGATIVE Final   Comment:            The GeneXpert MRSA Assay (FDA     approved for NASAL specimens     only), is one component of a     comprehensive MRSA colonization     surveillance program. It is not     intended to diagnose MRSA     infection nor to guide or     monitor treatment for     MRSA infections.     RESULT CALLED TO, READ BACK BY AND VERIFIED WITH:     M.EVANGALISTA,RN AT 0044 BY L.PITT 03/16/14     Studies:  Recent x-ray studies have been reviewed in detail by the Attending Physician       Erin Hearing, ANP [facility] Office  [phone] Pager [phone]  **Disclaimer: This note may have been dictated with voice recognition software. Similar sounding words can inadvertently be transcribed and this note may contain transcription errors which may not have been corrected upon publication of note.**   **If unable to reach the above provider after paging please contact the Flow Manager @ [phone]  On-Call/Text Page:       [email or web address]      password [email or web address]  If 7PM-7AM, please contact night-coverage [email or web address] Password [email or web address] 03/16/2014, 12:41 PM   LOS: 1 day   Examining patient with ANP Ebony Hail and agree with assessment and plan. Assessment and plan explained to patient and all questions answered. Greater than 30 minutes were involved with patient care.

## 2014-03-16 NOTE — Progress Notes (Addendum)
Clinical Social Work Department CLINICAL SOCIAL WORK PLACEMENT NOTE 03/16/2014  Patient:  Tara Huff, Tara Huff  Account Number:  [id number] [place] date:  03/15/2014  Clinical Social Worker:  Ky Barban, Latanya Presser  Date/time:  03/16/2014 03:57 PM  Clinical Social Work is seeking post-discharge placement for this patient at the following level of care:   [place]   (*CSW will update this form in Epic as items are completed)   N/A-pt authorized referrals made to all SNFs in [facility] except [facility] and [place]  Patient/family provided with [place] [facility] of [place] list of facilities offering this level of care within the geographic area requested by the patient (or if unable, by the patient's family).  03/16/2014  Patient/family informed of their freedom to choose among providers that offer the needed level of care, that participate in Medicare, Medicaid or managed care program needed by the patient, have an available bed and are willing to accept the patient.  N/A-clinicals not sent to facility  Patient/family informed of [facility]' ownership interest in [facility], as well as of the fact that they are under no obligation to receive care at this facility.  PASARR submitted to EDS on EXISTING PASARR number received from EDS on EXISTING  FL2 transmitted to all facilities in geographic area requested by pt/family on  03/16/2014 FL2 transmitted to all facilities within larger geographic area on   Patient informed that his/her managed care company has contracts with or will negotiate with  certain facilities, including the following:     Patient/family informed of bed offers received:  03/17/2014 Patient chooses bed at  Physician recommends and patient chooses bed at    Patient to be transferred to  on   Patient to be transferred to facility by   The following physician request were entered in  Epic:   Additional Comments:   Ky Barban, MSW, [place] Social Worker [phone]

## 2014-03-17 ENCOUNTER — Inpatient Hospital Stay (HOSPITAL_COMMUNITY): Payer: Medicare Other

## 2014-03-17 DIAGNOSIS — I369 Nonrheumatic tricuspid valve disorder, unspecified: Secondary | ICD-10-CM

## 2014-03-17 LAB — CBC
HCT: 35.6 % — ABNORMAL LOW (ref 36.0–46.0)
Hemoglobin: 11.8 g/dL — ABNORMAL LOW (ref 12.0–15.0)
MCH: 29.8 pg (ref 26.0–34.0)
MCHC: 33.1 g/dL (ref 30.0–36.0)
MCV: 89.9 fL (ref 78.0–100.0)
Platelets: 148 10*3/uL — ABNORMAL LOW (ref 150–400)
RBC: 3.96 MIL/uL (ref 3.87–5.11)
RDW: 15.5 % (ref 11.5–15.5)
WBC: 10 10*3/uL (ref 4.0–10.5)

## 2014-03-17 LAB — URINE CULTURE: Colony Count: >=100000

## 2014-03-17 LAB — GLUCOSE, CAPILLARY
GLUCOSE-CAPILLARY: 94 mg/dL (ref 70–99)
GLUCOSE-CAPILLARY: 80 mg/dL (ref 70–99)
Glucose-Capillary: 117 mg/dL — ABNORMAL HIGH (ref 70–99)
Glucose-Capillary: 62 mg/dL — ABNORMAL LOW (ref 70–99)
Glucose-Capillary: 67 mg/dL — ABNORMAL LOW (ref 70–99)
Glucose-Capillary: 96 mg/dL (ref 70–99)
Glucose-Capillary: 96 mg/dL (ref 70–99)

## 2014-03-17 LAB — BASIC METABOLIC PANEL
BUN: 40 mg/dL — ABNORMAL HIGH (ref 6–23)
CHLORIDE: 104 meq/L (ref 96–112)
CO2: 21 mEq/L (ref 19–32)
Calcium: 7.8 mg/dL — ABNORMAL LOW (ref 8.4–10.5)
Creatinine, Ser: 1.22 mg/dL — ABNORMAL HIGH (ref 0.50–1.10)
GFR calc non Af Amer: 42 mL/min — ABNORMAL LOW (ref >90)
GFR, EST AFRICAN AMERICAN: 48 mL/min — AB (ref >90)
Glucose, Bld: 112 mg/dL — ABNORMAL HIGH (ref 70–99)
POTASSIUM: 4.3 meq/L (ref 3.7–5.3)
Sodium: 137 mEq/L (ref 137–147)

## 2014-03-17 LAB — MAGNESIUM: Magnesium: 1.7 mg/dL (ref 1.5–2.5)

## 2014-03-17 LAB — LACTIC ACID, PLASMA: Lactic Acid, Venous: 1.5 mmol/L (ref 0.5–2.2)

## 2014-03-17 MED ORDER — SODIUM CHLORIDE 0.9 % IV BOLUS (SEPSIS)
500.0000 mL | Freq: Once | INTRAVENOUS | Status: AC
  Administered 2014-03-17: 500 mL via INTRAVENOUS

## 2014-03-17 MED ORDER — GLUCOSE 40 % PO GEL
1.0000 | ORAL | Status: DC | PRN
  Administered 2014-03-17: 37.5 g via ORAL

## 2014-03-17 MED ORDER — CHLORHEXIDINE GLUCONATE 0.12 % MT SOLN
15.0000 mL | Freq: Two times a day (BID) | OROMUCOSAL | Status: DC
  Administered 2014-03-17 – 2014-03-20 (×8): 15 mL via OROMUCOSAL
  Filled 2014-03-17 (×10): qty 15

## 2014-03-17 MED ORDER — PAROXETINE HCL 20 MG PO TABS
20.0000 mg | ORAL_TABLET | Freq: Every day | ORAL | Status: DC
  Administered 2014-03-17 – 2014-03-20 (×4): 20 mg via ORAL
  Filled 2014-03-17 (×4): qty 1

## 2014-03-17 MED ORDER — GLUCOSE 40 % PO GEL
ORAL | Status: AC
  Filled 2014-03-17: qty 1

## 2014-03-17 MED ORDER — BIOTENE DRY MOUTH MT LIQD
15.0000 mL | Freq: Two times a day (BID) | OROMUCOSAL | Status: DC
  Administered 2014-03-17 – 2014-03-20 (×8): 15 mL via OROMUCOSAL

## 2014-03-17 NOTE — Progress Notes (Addendum)
MD contacted to inform that pt experiencing HR 140's , sustained 100-120's. RR 20's. MD at bedside during event. No additonal interventions ordered at this time.  Nurse will continue to monitor.

## 2014-03-17 NOTE — Progress Notes (Signed)
Pt refusing Cpap,states she doesn't wear one at home ,when I explained to her why it was ordered she stated she didn't feel like she needed it and didn't want to try.

## 2014-03-17 NOTE — Progress Notes (Signed)
Weekend CSW met with patient to provide bed offers. CSW provided patient with emotional support. Patient has no questions at this time.  Tilden Fossa, MSW, [place] Clinical Social Worker [facility] Emergency [facility]. [phone]

## 2014-03-17 NOTE — Progress Notes (Signed)
Pt's cbg was in the 60s @0020 , pt was given apple juice per protocol, recheck of cbg was in the 90s. Will continue to monitor pt.----Derricka Mertz, rn

## 2014-03-17 NOTE — Progress Notes (Signed)
Echocardiogram 2D Echocardiogram has been performed.  Tara Huff 03/17/2014, 11:19 AM

## 2014-03-17 NOTE — Progress Notes (Signed)
Hypoglycemic Event  CBG: 62  Treatment: 1 tube instant glucose  Symptoms: None  Follow-up CBG: [id number] CBG Result:80  Possible Reasons for Event: Unknown  Comments/MD notified:[place]    Tara Huff  Remember to initiate Hypoglycemia Order Set & complete

## 2014-03-17 NOTE — Progress Notes (Signed)
[place] TEAM 1 - Stepdown/ICU TEAM Progress Note  Tara Huff XTG:[id number] DOB: May 28, 1934 DOA: 03/15/2014 PCP: Reginia Naas, MD  Admit HPI / Brief Narrative: 80 y.o. WF PMHx anxiety, diabetes type 2 with peripheral circulatory disorder, HLD, OSA (noncompliant with CPAP), chronic systolic CHF, OSA, atrial fibrillation with RVR, Hx MI. Presented from home. She was recently discharged from [facility]. Her husband has been caring for her. At baseline, she requires help getting out of bed, she spends the entire day in a wheelchair. She wears a diaper and has decubitus ulcers.  She presented to the hospital after feeling tired, she was found by EMS to be tachycardic with a rate of 200- was given adenosine without improvement. She then was given Cardizem and was found to be in a fib in the ER. She denied chest pain, no SOB, no fevers, no chills  She was seen by cardiology who started patient on amiodarone. She was also thought to be septic  Admitting MD spoke with her husband who confirmed that patient is DNR/DNI.    HPI/Subjective: 5/9 RN reports several episodes of asymptomatic hypoglycemia.  Assessment/Plan: Sepsis/Pseudomonas bacteremia -Continue cefepime -Obtain blood culture Monday, if negative consult for PICC line placement   Atrial fibrillation with rapid ventricular response  -still tachycardic but in the low 100s  -required Amiodarone but now off-cont digoxin  -suspect being driven by sepsis and dehydration  -has underlying chronic AF   Sepsis/ Hypotension/GNR bacteremia  -source appears to be LLE cellulitis in addition to ? UTI  -1/2 blood cx's positive for GNRs (likely urinary source)  -PCT 1.29  -lactic acid 1.8  -continue broad spectrum empiric anbx's  -follow up on cx's  -aggressive fluid resuscitation: IVFs at 100/hr with 500 cc bolus prn hypotension   Cellulitis of left leg  -very edematous and painful with marked erythema  -too painful to pursue venous  duplex to r/o DVT  -concerning for necrotizing soft tissue process so ck MRI leg  -Oxy IR and IV MSO4 for pain    UTI (lower urinary tract infection)  -urine cx pending-note blood cx positive for GNRs  -current empiric anbx's should cover GNRs   Decubitus skin ulcer  -per WOC RN recs   Acute respiratory failure with hypoxia:  -Resolved  Sleep apnea  -CPAP per respiratory  Chronic systolic congestive heart failure, NYHA class 1  -Echocardiogram pending  PULMONARY HYPERTENSION  -refusing CPAP at HS; CPAP per respiratory ordered  -HF compensated-EF per ECHO Jan ~40%  -agree with repeating since unknown duration of pronounced tachycardia and may have acute tachy mediated systolic dysfunction   CKD (chronic kidney disease), stage III  -baseline 43/1.23  -current function is stable  -change Vanco to Cubicin to minimize acute renal dysfunction esp in concurrent hypotension   Type II or unspecified type diabetes mellitus with peripheral circulatory disorders, uncontrolled(250.72)  -CBGs controlled  -HgbA1c was 6.6 in January  -SSI if needed  -Obtain A1c -Obtain lipid panel  Depression -Start Paxil 20 mg daily  Anxiety -See depression    Code Status: FULL Family Communication: no family present at time of exam Disposition Plan: Resolution of sepsis    Consultants: N./A.  Procedure/Significant Events: 5/7 PCXR -Stable cardiomegaly and interstitial pulmonary edema with bibasilar atelectasis w/suspected trace pleural effusions  03/16/2014 MRI tibia fibula left lower extremity without contrast -Intense bilateral subcutaneous edema > left.  - No abscess or evidence of osteomyelitis or myositis    Culture 5/7 MRSA by PCR positive 5/7 blood  culture left hand/right arm positive Pseudomonas aeruginosa 5/7 urine culture positive gram-negative rods  Antibiotics: Cefepime 5/7 >>>  Daptomycin 5/8 >>>  Vancomycin 5/7 >>> 5/8   DVT prophylaxis: Heparin  drip   Devices N./A.   LINES / TUBES:  5/7 18 Ga left antecubital 5/8 24 Ga right upper    Continuous Infusions: . sodium chloride 100 mL/hr at 03/17/14 0113    Objective: VITAL SIGNS: Temp: 97.3 F (36.3 C) (05/09 0800) Temp src: Oral (05/09 0800) BP: 94/55 mmHg (05/09 0800) Pulse Rate: 100 (05/09 0800) SPO2; 100% on 2 L [place] FIO2:   Intake/Output Summary (Last 24 hours) at 03/17/14 9030 Last data filed at 03/17/14 0800  Gross per 24 hour  Intake 2789.9 ml  Output   1202 ml  Net 1587.9 ml     Exam: General: A./O. x4, emotional secondary to husband feeding her hospital and then leaving town, No acute respiratory distress Lungs: Clear to auscultation bilaterally without wheezes or crackles Cardiovascular: Tachycardic, Regular rhythm without murmur gallop or rub normal S1 and S2 Abdomen: Morbidly obese, Nontender, nondistended, soft, bowel sounds positive, no rebound, no ascites, no appreciable mass Extremities: Bilateral lower extremity edema. Left leg from knee -foot, positive erythema, positive pain but decreased from previous day (now knee and ankle were as on previous day mid thigh to ankle)   Data Reviewed: Basic Metabolic Panel:  Recent Labs Lab 03/15/14 1331 03/16/14 0345 03/16/14 1015  NA 141 137 133*  K 4.8 5.2 4.8  CL 102 103 100  CO2 21 20 21   GLUCOSE 102* 95 146*  BUN 35* 39* 42*  CREATININE 1.24* 1.34* 1.37*  CALCIUM 8.9 7.8* 7.8*   Liver Function Tests:  Recent Labs Lab 03/16/14 1015  AST 32  ALT 9  ALKPHOS 107  BILITOT 0.6  PROT 4.6*  ALBUMIN 1.5*   No results found for this basename: LIPASE, AMYLASE,  in the last 168 hours No results found for this basename: AMMONIA,  in the last 168 hours CBC:  Recent Labs Lab 03/15/14 1331 03/16/14 0345 03/17/14 0318  WBC 18.8* 18.3* 10.0  NEUTROABS 17.8*  --   --   HGB 14.6 11.9* 11.8*  HCT 43.3 36.4 35.6*  MCV 89.3 90.3 89.9  PLT 194 165 148*   Cardiac Enzymes:  Recent  Labs Lab 03/15/14 1331 03/16/14 0345 03/16/14 1015  CKTOTAL  --   --  41  TROPONINI <0.30 <0.30 <0.30   BNP (last 3 results)  Recent Labs  04/27/13 1733 11/14/13 1002 03/15/14 1331  PROBNP 2160.0* 1616.0* 3771.0*   CBG:  Recent Labs Lab 03/15/14 2353 03/16/14 1756 03/17/14 0020  GLUCAP 117* 72 67*    Recent Results (from the past 240 hour(s))  URINE CULTURE     Status: None   Collection Time    03/15/14  3:24 PM      Result Value Ref Range Status   Specimen Description URINE, CATHETERIZED   Final   Special Requests NONE   Final   Culture  Setup Time     Final   Value: 03/15/2014 16:46     Performed at [facility]     Final   Value: >=100,000 COLONIES/ML     Performed at [facility]   Culture     Final   Value: [place]     Performed at [facility]   Report Status PENDING   Incomplete  CULTURE, BLOOD (ROUTINE X 2)  Status: None   Collection Time    03/15/14  3:39 PM      Result Value Ref Range Status   Specimen Description BLOOD RIGHT ARM   Final   Special Requests BOTTLES DRAWN AEROBIC AND ANAEROBIC 5CCS   Final   Culture  Setup Time     Final   Value: 03/15/2014 20:47     Performed at [facility]   Culture     Final   Value: PSEUDOMONAS AERUGINOSA     Note: Gram Stain Report Called to,Read Back By and Verified With: RONCALLO @1410  ON [id number] BY [facility]     Performed at [facility]   Report Status PENDING   Incomplete  CULTURE, BLOOD (ROUTINE X 2)     Status: None   Collection Time    03/15/14  3:46 PM      Result Value Ref Range Status   Specimen Description BLOOD LEFT HAND   Final   Special Requests BOTTLES DRAWN AEROBIC ONLY 5CC   Final   Culture  Setup Time     Final   Value: 03/15/2014 20:48     Performed at [facility]   Culture     Final   Value: PSEUDOMONAS AERUGINOSA     Note: Gram Stain Report Called to,Read Back By and Verified With: RONCALLO @1410  ON [id number]  BY [facility]     Performed at [facility]   Report Status PENDING   Incomplete  MRSA PCR SCREENING     Status: Abnormal   Collection Time    03/15/14 11:06 PM      Result Value Ref Range Status   MRSA by PCR POSITIVE (*) NEGATIVE Final   Comment:            The GeneXpert MRSA Assay (FDA     approved for NASAL specimens     only), is one component of a     comprehensive MRSA colonization     surveillance program. It is not     intended to diagnose MRSA     infection nor to guide or     monitor treatment for     MRSA infections.     RESULT CALLED TO, READ BACK BY AND VERIFIED WITH:     M.EVANGALISTA,RN AT 0044 BY L.PITT 03/16/14     Studies:  Recent x-ray studies have been reviewed in detail by the Attending Physician  Scheduled Meds:  Scheduled Meds: . antiseptic oral rinse  15 mL Mouth Rinse q12n4p  . ceFEPime (MAXIPIME) IV  1 g Intravenous Q8H  . chlorhexidine  15 mL Mouth Rinse BID  . Chlorhexidine Gluconate Cloth  6 each Topical Q0600  . DAPTOmycin (CUBICIN)  IV  495 mg Intravenous Q24H  . digoxin  0.125 mg Intravenous Daily  . feeding supplement (GLUCERNA SHAKE)  237 mL Oral BID BM  . heparin  5,000 Units Subcutaneous 3 times per day  . insulin aspart  0-5 Units Subcutaneous QHS  . insulin aspart  0-9 Units Subcutaneous TID WC  . mupirocin ointment  1 application Nasal BID  . sodium chloride  3 mL Intravenous Q12H    Time spent on care of this patient: 40 mins   Allie Bossier , MD   [facility] Office  [phone] Pager [phone]  On-Call/Text Page:      [email or web address]      password [email or web address]  If 7PM-7AM, please contact night-coverage [email or web address] Password [email or web address] 03/17/2014, 9:07 AM  LOS: 2 days

## 2014-03-18 DIAGNOSIS — R5381 Other malaise: Secondary | ICD-10-CM

## 2014-03-18 DIAGNOSIS — L8990 Pressure ulcer of unspecified site, unspecified stage: Secondary | ICD-10-CM

## 2014-03-18 DIAGNOSIS — E7800 Pure hypercholesterolemia, unspecified: Secondary | ICD-10-CM

## 2014-03-18 LAB — COMPREHENSIVE METABOLIC PANEL
ALBUMIN: 1.4 g/dL — AB (ref 3.5–5.2)
ALT: 9 U/L (ref 0–35)
AST: 32 U/L (ref 0–37)
Alkaline Phosphatase: 107 U/L (ref 39–117)
BUN: 32 mg/dL — ABNORMAL HIGH (ref 6–23)
CALCIUM: 7.7 mg/dL — AB (ref 8.4–10.5)
CO2: 19 mEq/L (ref 19–32)
CREATININE: 1.06 mg/dL (ref 0.50–1.10)
Chloride: 105 mEq/L (ref 96–112)
GFR calc Af Amer: 57 mL/min — ABNORMAL LOW (ref >90)
GFR calc non Af Amer: 49 mL/min — ABNORMAL LOW (ref >90)
Glucose, Bld: 78 mg/dL (ref 70–99)
Potassium: 4.3 mEq/L (ref 3.7–5.3)
SODIUM: 137 meq/L (ref 137–147)
Total Bilirubin: 0.6 mg/dL (ref 0.3–1.2)
Total Protein: 4.6 g/dL — ABNORMAL LOW (ref 6.0–8.3)

## 2014-03-18 LAB — CBC WITH DIFFERENTIAL/PLATELET
BASOS ABS: 0 10*3/uL (ref 0.0–0.1)
BASOS PCT: 0 % (ref 0–1)
EOS PCT: 3 % (ref 0–5)
Eosinophils Absolute: 0.2 10*3/uL (ref 0.0–0.7)
HEMATOCRIT: 35.9 % — AB (ref 36.0–46.0)
Hemoglobin: 11.9 g/dL — ABNORMAL LOW (ref 12.0–15.0)
Lymphocytes Relative: 9 % — ABNORMAL LOW (ref 12–46)
Lymphs Abs: 0.6 10*3/uL — ABNORMAL LOW (ref 0.7–4.0)
MCH: 29.9 pg (ref 26.0–34.0)
MCHC: 33.1 g/dL (ref 30.0–36.0)
MCV: 90.2 fL (ref 78.0–100.0)
Monocytes Absolute: 0.5 10*3/uL (ref 0.1–1.0)
Monocytes Relative: 7 % (ref 3–12)
Neutro Abs: 5.1 10*3/uL (ref 1.7–7.7)
Neutrophils Relative %: 81 % — ABNORMAL HIGH (ref 43–77)
PLATELETS: 143 10*3/uL — AB (ref 150–400)
RBC: 3.98 MIL/uL (ref 3.87–5.11)
RDW: 15.4 % (ref 11.5–15.5)
WBC: 6.4 10*3/uL (ref 4.0–10.5)

## 2014-03-18 LAB — CULTURE, BLOOD (ROUTINE X 2)

## 2014-03-18 LAB — GLUCOSE, CAPILLARY
GLUCOSE-CAPILLARY: 78 mg/dL (ref 70–99)
GLUCOSE-CAPILLARY: 103 mg/dL — AB (ref 70–99)
Glucose-Capillary: 75 mg/dL (ref 70–99)
Glucose-Capillary: 129 mg/dL — ABNORMAL HIGH (ref 70–99)
Glucose-Capillary: 116 mg/dL — ABNORMAL HIGH (ref 70–99)

## 2014-03-18 LAB — MAGNESIUM: Magnesium: 1.7 mg/dL (ref 1.5–2.5)

## 2014-03-18 LAB — DIGOXIN LEVEL: Digoxin Level: 0.5 ng/mL — ABNORMAL LOW (ref 0.8–2.0)

## 2014-03-18 MED ORDER — CARVEDILOL 3.125 MG PO TABS
3.1250 mg | ORAL_TABLET | Freq: Two times a day (BID) | ORAL | Status: DC
  Administered 2014-03-19 – 2014-03-20 (×2): 3.125 mg via ORAL
  Filled 2014-03-18 (×5): qty 1

## 2014-03-18 MED ORDER — CARVEDILOL 6.25 MG PO TABS
6.2500 mg | ORAL_TABLET | Freq: Two times a day (BID) | ORAL | Status: DC
  Filled 2014-03-18 (×2): qty 1

## 2014-03-18 MED ORDER — PIPERACILLIN-TAZOBACTAM 3.375 G IVPB
3.3750 g | Freq: Three times a day (TID) | INTRAVENOUS | Status: DC
  Administered 2014-03-18 – 2014-03-20 (×7): 3.375 g via INTRAVENOUS
  Filled 2014-03-18 (×11): qty 50

## 2014-03-18 NOTE — Progress Notes (Signed)
Talked to pt again about Cpap,pt very adamant about not wearing one ,states she has tried it several times and can't tolerate it.I told to call if she changed her mind.

## 2014-03-18 NOTE — Plan of Care (Signed)
Problem: Phase I Progression Outcomes Goal: OOB as tolerated unless otherwise ordered Outcome: Not Progressing Pt refused

## 2014-03-18 NOTE — Progress Notes (Signed)
ANTIBIOTIC CONSULT NOTE - FOLLOW UP  Pharmacy Consult for Zosyn Indication: bacteremia/UTI  No Known Allergies  Patient Measurements: Height: 5\' 8"  (172.7 cm) Weight: 279 lb 8.7 oz (126.8 kg) IBW/kg (Calculated) : 63.9  Vital Signs: Temp: 97.9 F (36.6 C) (05/10 1151) Temp src: Oral (05/10 1151) BP: 108/67 mmHg (05/10 1151) Pulse Rate: 106 (05/10 1151) Intake/Output from previous day: 05/09 0701 - 05/10 0700 In: 1293 [P.O.:290; I.V.:1003] Out: 1702 [Urine:1700; Stool:2] Intake/Output from this shift: Total I/O In: 2243 [P.O.:240; I.V.:2003] Out: 750 [Urine:750]  Labs:  Recent Labs  03/16/14 0345 03/16/14 1015 03/17/14 0318 03/17/14 0920 03/18/14 0520  WBC 18.3*  --  10.0  --  6.4  HGB 11.9*  --  11.8*  --  11.9*  PLT 165  --  148*  --  143*  CREATININE 1.34* 1.37*  --  1.22* 1.06   Estimated Creatinine Clearance: 62.5 ml/min (by C-G formula based on Cr of 1.06). No results found for this basename: [place]ta Median, VANCORANDOM, GENTTROUGH, GENTPEAK, GENTRANDOM, TOBRATROUGH, TOBRAPEAK, TOBRARND, AMIKACINPEAK, AMIKACINTROU, AMIKACIN,  in the last 72 hours   Microbiology: Recent Results (from the past 720 hour(s))  URINE CULTURE     Status: None   Collection Time    03/15/14  3:24 PM      Result Value Ref Range Status   Specimen Description URINE, CATHETERIZED   Final   Special Requests NONE   Final   Culture  Setup Time     Final   Value: 03/15/2014 16:46     Performed at [facility]     Final   Value: >=100,000 COLONIES/ML     Performed at [facility]   Culture     Final   Value: KLEBSIELLA PNEUMONIAE     Note: Confirmed Extended Spectrum Beta-Lactamase Producer (ESBL)     Performed at [facility]   Report Status 03/17/2014 FINAL   Final   Organism ID, Bacteria KLEBSIELLA PNEUMONIAE   Final  CULTURE, BLOOD (ROUTINE X 2)     Status: None   Collection Time    03/15/14  3:39 PM      Result Value Ref Range  Status   Specimen Description BLOOD RIGHT ARM   Final   Special Requests BOTTLES DRAWN AEROBIC AND ANAEROBIC 5CCS   Final   Culture  Setup Time     Final   Value: 03/15/2014 20:47     Performed at [facility]   Culture     Final   Value: PSEUDOMONAS AERUGINOSA     Note: SUSCEPTIBILITIES PERFORMED ON PREVIOUS CULTURE WITHIN THE LAST 5 DAYS.     Note: Gram Stain Report Called to,Read Back By and Verified With: RONCALLO @1410  ON [id number] BY [facility]     Performed at [facility]   Report Status 03/18/2014 FINAL   Final  CULTURE, BLOOD (ROUTINE X 2)     Status: None   Collection Time    03/15/14  3:46 PM      Result Value Ref Range Status   Specimen Description BLOOD LEFT HAND   Final   Special Requests BOTTLES DRAWN AEROBIC ONLY 5CC   Final   Culture  Setup Time     Final   Value: 03/15/2014 20:48     Performed at [facility]   Culture     Final   Value: PSEUDOMONAS AERUGINOSA     Note: Gram Stain Report Called to,Read Back By and  Verified With: RONCALLO @1410  ON [id number] BY [facility]     Performed at [facility]   Report Status 03/18/2014 FINAL   Final   Organism ID, Bacteria PSEUDOMONAS AERUGINOSA   Final  MRSA PCR SCREENING     Status: Abnormal   Collection Time    03/15/14 11:06 PM      Result Value Ref Range Status   MRSA by PCR POSITIVE (*) NEGATIVE Final   Comment:            The GeneXpert MRSA Assay (FDA     approved for NASAL specimens     only), is one component of a     comprehensive MRSA colonization     surveillance program. It is not     intended to diagnose MRSA     infection nor to guide or     monitor treatment for     MRSA infections.     RESULT CALLED TO, READ BACK BY AND VERIFIED WITH:     M.EVANGALISTA,RN AT 0044 BY L.PITT 03/16/14    Anti-infectives   Start     Dose/Rate Route Frequency Ordered Stop   03/16/14 1200  DAPTOmycin (CUBICIN) 495 mg in sodium chloride 0.9 % IVPB     495 mg 219.8 mL/hr over 30 Minutes Intravenous  Every 24 hours 03/16/14 1005     03/16/14 1000  linezolid (ZYVOX) IVPB 600 mg  Status:  Discontinued     600 mg 300 mL/hr over 60 Minutes Intravenous Every 12 hours 03/16/14 0949 03/16/14 0955   03/16/14 0800  vancomycin (VANCOCIN) IVPB 1000 mg/200 mL premix  Status:  Discontinued     1,000 mg 200 mL/hr over 60 Minutes Intravenous Every 12 hours 03/15/14 1833 03/16/14 0949   03/16/14 0300  ceFEPIme (MAXIPIME) 1 g in dextrose 5 % 50 mL IVPB     1 g 100 mL/hr over 30 Minutes Intravenous Every 8 hours 03/15/14 1833     03/15/14 1830  ceFEPIme (MAXIPIME) 2 g in dextrose 5 % 50 mL IVPB     2 g 100 mL/hr over 30 Minutes Intravenous  Once 03/15/14 1827 03/15/14 1928   03/15/14 1830  vancomycin (VANCOCIN) 2,000 mg in sodium chloride 0.9 % 500 mL IVPB     2,000 mg 250 mL/hr over 120 Minutes Intravenous  Once 03/15/14 1827 03/15/14 2214   03/15/14 1700  cefTRIAXone (ROCEPHIN) 1 g in dextrose 5 % 50 mL IVPB  Status:  Discontinued     1 g 100 mL/hr over 30 Minutes Intravenous  Once 03/15/14 1656 03/15/14 1826      Assessment: 56 yoF presented with malaise and fever of 101.2. Pharmacy was initially consulted to dose cefepime/daptomycin for cellulitis/decub ulcers/UTI. Patient started growing GNR in blood and urine, now speciated out to pseudomonas and kleb, respectively. Patient is currently afeb with wbc wnl. Bugs are resistant to different agents, however both suceptible to zosyn.   Vanc 5/7>>5/8  Cefepime 5/7>>  Dapto 5/8>>   5/7 BCx2 - pseudomonas aeruginosa (R cipro and imipenem) 5/7 Urine - kleb >100k (S to bactrim, macrobid, zosyn) MRSA PCR +  Goal of Therapy:  Eradicate Infection  Plan:  Start Zosyn 3.375g IV q8h (extended infusion) Continue Daptomycin 4mg /kg/day Monitor weekly CK and renal function Follow up on repeat blood cultures  Angelica Chessman 03/18/2014,2:11 PM

## 2014-03-18 NOTE — Progress Notes (Signed)
[place] TEAM 1 - Stepdown/ICU TEAM Progress Note  Tara Huff OVF:[id number] DOB: [birth year] DOA: 03/15/2014 PCP: Reginia Naas, MD  Admit HPI / Brief Narrative: 80 y.o. WF PMHx anxiety, diabetes type 2 with peripheral circulatory disorder, HLD, OSA (noncompliant with CPAP), chronic systolic CHF, OSA, atrial fibrillation with RVR, Hx MI. Presented from home. She was recently discharged from [facility]. Her husband has been caring for her. At baseline, she requires help getting out of bed, she spends the entire day in a wheelchair. She wears a diaper and has decubitus ulcers.  She presented to the hospital after feeling tired, she was found by EMS to be tachycardic with a rate of 200- was given adenosine without improvement. She then was given Cardizem and was found to be in a fib in the ER. She denied chest pain, no SOB, no fevers, no chills  She was seen by cardiology who started patient on amiodarone. She was also thought to be septic  Admitting MD spoke with her husband who confirmed that patient is DNR/DNI.    HPI/Subjective: 5/10  patient states was not instructed she needed to be out of bed every shift., However RN Aldona Bar reminded patient that she had been instructed yesterday concerning OOB  Assessment/Plan: Sepsis/Pseudomonas bacteremia -Continue cefepime -Obtain blood culture Monday, if negative consult for PICC line placement   Atrial fibrillation with rapid ventricular response  -still tachycardic but in the low 100s  -required Amiodarone but now off-cont digoxin  -suspect being driven by sepsis and dehydration  -has underlying chronic AF -Start Coreg 3.25 mg BID   Sepsis/ Hypotension/GNR bacteremia  -source appears to be bacteremia, LLE cellulitis, and UTI  - blood cx's positive for GNRs Pseudomonas aeruginosa   -PCT 1.29  -lactic acid 1.8  -continue broad spectrum empiric anbx's  -Continue normal saline at 100/hr   Cellulitis of left leg  -very edematous and  painful with marked erythema  -too painful to pursue venous duplex to r/o DVT  -concerning for necrotizing soft tissue process so ck MRI leg  -Oxy IR and IV MSO4 for pain  -Continue daptomycin  UTI (lower urinary tract infection)  -urine cx Klebsiella Pneumoniae resistant to cefepime -5/9 DC cefepime and start Zosyn per pharmacy    Decubitus skin ulcer  -per [facility] RN recs -Continue antifungal powder under her pannus between thighs, mons pubis.   Acute respiratory failure with hypoxia:  -Resolved  Sleep apnea  -CPAP per respiratory  Chronic systolic congestive heart failure, NYHA class 1  -Echocardiogram; systolic CHF, LVEF 84% -Start Coreg 3.125 mg BID  PULMONARY HYPERTENSION  -refusing CPAP at HS; CPAP per respiratory ordered  -HF compensated-EF per ECHO Jan ~40%  - See chronic systolic CHF  CKD (chronic kidney disease), stage III  -baseline 43/1.23  -current function is stable  -change Vanco to Cubicin to minimize acute renal dysfunction esp in concurrent hypotension   Type II or unspecified type diabetes mellitus with peripheral circulatory disorders, uncontrolled(250.72)  -CBGs controlled  -HgbA1c was 6.6 in January  -SSI if needed  -Obtain A1c -Obtain lipid panel  Depression -Continue Paxil 20 mg daily  Anxiety -See depression    Code Status: FULL Family Communication: no family present at time of exam Disposition Plan: Resolution of sepsis    Consultants: N./A.  Procedure/Significant Events: 5/7 PCXR -Stable cardiomegaly and interstitial pulmonary edema with bibasilar atelectasis w/suspected trace pleural effusions  03/16/2014 MRI tibia fibula left lower extremity without contrast -Intense bilateral subcutaneous edema > left.  -  No abscess or evidence of osteomyelitis or myositis   Echocardiogram 03/17/2014 - Left ventricle: Paradoxical septal motion. Hypokinesis base inferior segment.  -LVEF= 50%. - Mitral valve: Mild regurgitation. - Left  atrium: moderately to severely dilated. - Right ventricle: moderately -severely dilated. Systolic function moderately-severely reduced. - Right atrium: moderately to severely dilated. - Tricuspid valve: Moderate-severe regurgitation. - Pulmonary arteries: PA peak pressure: 74mm Hg (S). - Inferior vena cava: The vessel was dilated; Significant dilitation of the IVC.    Culture 5/7 MRSA by PCR positive 5/7 blood culture left hand/right arm positive Pseudomonas aeruginosa 5/7 urine culture positive gram-negative rods Klebsiella Pneumoniae resistant to cefepime  Antibiotics: Cefepime 5/7 >>> stopped 5/10 Vancomycin 5/7 >>> stopped 5/8 Daptomycin 5/8 >>>  Zosyn 5/10>>   DVT prophylaxis: Heparin drip   Devices N./A.   LINES / TUBES:  5/7 18 Ga left antecubital 5/8 24 Ga right upper    Continuous Infusions: . sodium chloride 100 mL/hr at 03/17/14 0113    Objective: VITAL SIGNS: Temp: 97.9 F (36.6 C) (05/10 1151) Temp src: Oral (05/10 1151) BP: 108/67 mmHg (05/10 1151) Pulse Rate: 106 (05/10 1151) SPO2; 100% on 2 L [place] FIO2:   Intake/Output Summary (Last 24 hours) at 03/18/14 1406 Last data filed at 03/18/14 1400  Gross per 24 hour  Intake   2933 ml  Output   2451 ml  Net    482 ml     Exam: General: A./O. x4, emotional secondary to husband feeding her hospital and then leaving town, No acute respiratory distress Lungs: Clear to auscultation bilaterally without wheezes or crackles Cardiovascular: Tachycardic, Regular rhythm without murmur gallop or rub normal S1 and S2 Abdomen: Morbidly obese, Nontender, nondistended, soft, bowel sounds positive, no rebound, no ascites, no appreciable mass Extremities: Bilateral lower extremity edema. Left leg from knee -foot, positive erythema, positive pain but continues to decrease (now below knee to ankle were as on previous day mid thigh to ankle), patient has candidal rash between thighs under pannus  Data  Reviewed: Basic Metabolic Panel:  Recent Labs Lab 03/15/14 1331 03/16/14 0345 03/16/14 1015 03/17/14 0920 03/18/14 0520  NA 141 137 133* 137 137  K 4.8 5.2 4.8 4.3 4.3  CL 102 103 100 104 105  CO2 21 20 21 21 19   GLUCOSE 102* 95 146* 112* 78  BUN 35* 39* 42* 40* 32*  CREATININE 1.24* 1.34* 1.37* 1.22* 1.06  CALCIUM 8.9 7.8* 7.8* 7.8* 7.7*  MG  --   --   --  1.7 1.7   Liver Function Tests:  Recent Labs Lab 03/16/14 1015 03/18/14 0520  AST 32 32  ALT 9 9  ALKPHOS 107 107  BILITOT 0.6 0.6  PROT 4.6* 4.6*  ALBUMIN 1.5* 1.4*   No results found for this basename: LIPASE, AMYLASE,  in the last 168 hours No results found for this basename: AMMONIA,  in the last 168 hours CBC:  Recent Labs Lab 03/15/14 1331 03/16/14 0345 03/17/14 0318 03/18/14 0520  WBC 18.8* 18.3* 10.0 6.4  NEUTROABS 17.8*  --   --  5.1  HGB 14.6 11.9* 11.8* 11.9*  HCT 43.3 36.4 35.6* 35.9*  MCV 89.3 90.3 89.9 90.2  PLT 194 165 148* 143*   Cardiac Enzymes:  Recent Labs Lab 03/15/14 1331 03/16/14 0345 03/16/14 1015  CKTOTAL  --   --  33  TROPONINI <0.30 <0.30 <0.30   BNP (last 3 results)  Recent Labs  04/27/13 1733 11/14/13 1002 03/15/14 1331  PROBNP 2160.0*  1616.0* 3771.0*   CBG:  Recent Labs Lab 03/17/14 1603 03/17/14 2121 03/18/14 0651 03/18/14 0841 03/18/14 1150  GLUCAP 117* 96 75 78 103*    Recent Results (from the past 240 hour(s))  URINE CULTURE     Status: None   Collection Time    03/15/14  3:24 PM      Result Value Ref Range Status   Specimen Description URINE, CATHETERIZED   Final   Special Requests NONE   Final   Culture  Setup Time     Final   Value: 03/15/2014 16:46     Performed at [facility] Count     Final   Value: >=100,000 COLONIES/ML     Performed at [facility]   Culture     Final   Value: KLEBSIELLA PNEUMONIAE     Note: Confirmed Extended Spectrum Beta-Lactamase Producer (ESBL)     Performed at [facility]   Report Status 03/17/2014 FINAL   Final   Organism ID, Bacteria KLEBSIELLA PNEUMONIAE   Final  CULTURE, BLOOD (ROUTINE X 2)     Status: None   Collection Time    03/15/14  3:39 PM      Result Value Ref Range Status   Specimen Description BLOOD RIGHT ARM   Final   Special Requests BOTTLES DRAWN AEROBIC AND ANAEROBIC 5CCS   Final   Culture  Setup Time     Final   Value: 03/15/2014 20:47     Performed at [facility]   Culture     Final   Value: PSEUDOMONAS AERUGINOSA     Note: SUSCEPTIBILITIES PERFORMED ON PREVIOUS CULTURE WITHIN THE LAST 5 DAYS.     Note: Gram Stain Report Called to,Read Back By and Verified With: RONCALLO @1410  ON [id number] BY [facility]     Performed at [facility]   Report Status 03/18/2014 FINAL   Final  CULTURE, BLOOD (ROUTINE X 2)     Status: None   Collection Time    03/15/14  3:46 PM      Result Value Ref Range Status   Specimen Description BLOOD LEFT HAND   Final   Special Requests BOTTLES DRAWN AEROBIC ONLY 5CC   Final   Culture  Setup Time     Final   Value: 03/15/2014 20:48     Performed at [facility]   Culture     Final   Value: PSEUDOMONAS AERUGINOSA     Note: Gram Stain Report Called to,Read Back By and Verified With: RONCALLO @1410  ON [id number] BY [facility]     Performed at [facility]   Report Status 03/18/2014 FINAL   Final   Organism ID, Bacteria PSEUDOMONAS AERUGINOSA   Final  MRSA PCR SCREENING     Status: Abnormal   Collection Time    03/15/14 11:06 PM      Result Value Ref Range Status   MRSA by PCR POSITIVE (*) NEGATIVE Final   Comment:            The GeneXpert MRSA Assay (FDA     approved for NASAL specimens     only), is one component of a     comprehensive MRSA colonization     surveillance program. It is not     intended to diagnose MRSA     infection nor to guide or     monitor treatment for     MRSA infections.  RESULT CALLED TO, READ BACK BY AND VERIFIED WITH:     M.EVANGALISTA,RN AT 0044  BY L.PITT 03/16/14     Studies:  Recent x-ray studies have been reviewed in detail by the Attending Physician  Scheduled Meds:  Scheduled Meds: . antiseptic oral rinse  15 mL Mouth Rinse q12n4p  . ceFEPime (MAXIPIME) IV  1 g Intravenous Q8H  . chlorhexidine  15 mL Mouth Rinse BID  . Chlorhexidine Gluconate Cloth  6 each Topical Q0600  . DAPTOmycin (CUBICIN)  IV  495 mg Intravenous Q24H  . digoxin  0.125 mg Intravenous Daily  . feeding supplement (GLUCERNA SHAKE)  237 mL Oral BID BM  . heparin  5,000 Units Subcutaneous 3 times per day  . insulin aspart  0-5 Units Subcutaneous QHS  . insulin aspart  0-9 Units Subcutaneous TID WC  . mupirocin ointment  1 application Nasal BID  . PARoxetine  20 mg Oral Daily  . sodium chloride  3 mL Intravenous Q12H    Time spent on care of this patient: 40 mins   Allie Bossier , MD   [facility] Office  [phone] Pager [phone]  On-Call/Text Page:      [email or web address]      password [email or web address]  If 7PM-7AM, please contact night-coverage [email or web address] Password [email or web address] 03/18/2014, 2:06 PM   LOS: 3 days

## 2014-03-19 LAB — CBC WITH DIFFERENTIAL/PLATELET
BASOS ABS: 0 10*3/uL (ref 0.0–0.1)
Basophils Relative: 0 % (ref 0–1)
Eosinophils Absolute: 0.3 10*3/uL (ref 0.0–0.7)
Eosinophils Relative: 5 % (ref 0–5)
HEMATOCRIT: 36.1 % (ref 36.0–46.0)
Hemoglobin: 11.8 g/dL — ABNORMAL LOW (ref 12.0–15.0)
Lymphocytes Relative: 11 % — ABNORMAL LOW (ref 12–46)
Lymphs Abs: 0.5 10*3/uL — ABNORMAL LOW (ref 0.7–4.0)
MCH: 29.6 pg (ref 26.0–34.0)
MCHC: 32.7 g/dL (ref 30.0–36.0)
MCV: 90.7 fL (ref 78.0–100.0)
Monocytes Absolute: 0.4 10*3/uL (ref 0.1–1.0)
Monocytes Relative: 9 % (ref 3–12)
NEUTROS ABS: 3.6 10*3/uL (ref 1.7–7.7)
NEUTROS PCT: 75 % (ref 43–77)
Platelets: 153 10*3/uL (ref 150–400)
RBC: 3.98 MIL/uL (ref 3.87–5.11)
RDW: 15.3 % (ref 11.5–15.5)
WBC: 4.7 10*3/uL (ref 4.0–10.5)

## 2014-03-19 LAB — COMPREHENSIVE METABOLIC PANEL
ALBUMIN: 1.4 g/dL — AB (ref 3.5–5.2)
ALK PHOS: 106 U/L (ref 39–117)
ALT: 10 U/L (ref 0–35)
AST: 34 U/L (ref 0–37)
BILIRUBIN TOTAL: 0.5 mg/dL (ref 0.3–1.2)
BUN: 25 mg/dL — ABNORMAL HIGH (ref 6–23)
CHLORIDE: 105 meq/L (ref 96–112)
CO2: 22 meq/L (ref 19–32)
CREATININE: 1 mg/dL (ref 0.50–1.10)
Calcium: 7.7 mg/dL — ABNORMAL LOW (ref 8.4–10.5)
GFR calc Af Amer: 61 mL/min — ABNORMAL LOW (ref >90)
GFR, EST NON AFRICAN AMERICAN: 53 mL/min — AB (ref >90)
Glucose, Bld: 88 mg/dL (ref 70–99)
POTASSIUM: 4.6 meq/L (ref 3.7–5.3)
Sodium: 137 mEq/L (ref 137–147)
Total Protein: 4.6 g/dL — ABNORMAL LOW (ref 6.0–8.3)

## 2014-03-19 LAB — LIPID PANEL
CHOL/HDL RATIO: 5 ratio
CHOLESTEROL: 90 mg/dL (ref 0–200)
HDL: 18 mg/dL — AB (ref >39)
LDL CALC: 56 mg/dL (ref 0–99)
Triglycerides: 78 mg/dL (ref <150)
VLDL: 16 mg/dL (ref 0–40)

## 2014-03-19 LAB — GLUCOSE, CAPILLARY
GLUCOSE-CAPILLARY: 125 mg/dL — AB (ref 70–99)
Glucose-Capillary: 104 mg/dL — ABNORMAL HIGH (ref 70–99)
Glucose-Capillary: 109 mg/dL — ABNORMAL HIGH (ref 70–99)
Glucose-Capillary: 113 mg/dL — ABNORMAL HIGH (ref 70–99)
Glucose-Capillary: 137 mg/dL — ABNORMAL HIGH (ref 70–99)
Glucose-Capillary: 95 mg/dL (ref 70–99)

## 2014-03-19 LAB — HEMOGLOBIN A1C
HEMOGLOBIN A1C: 6.3 % — AB (ref <5.7)
Mean Plasma Glucose: 134 mg/dL — ABNORMAL HIGH (ref <117)

## 2014-03-19 LAB — MAGNESIUM: Magnesium: 1.8 mg/dL (ref 1.5–2.5)

## 2014-03-19 MED ORDER — ASPIRIN EC 81 MG PO TBEC
81.0000 mg | DELAYED_RELEASE_TABLET | Freq: Every day | ORAL | Status: DC
  Administered 2014-03-19 – 2014-03-20 (×2): 81 mg via ORAL
  Filled 2014-03-19 (×2): qty 1
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
## 2014-03-19 NOTE — Progress Notes (Signed)
Patient is sleeping and did request the staff if she can go to sleep without being disturbed.

## 2014-03-19 NOTE — Progress Notes (Addendum)
[place] TEAM 1 - Stepdown/ICU TEAM Progress Note  Tara Huff OYD:[id number] DOB: [birth year] DOA: 03/15/2014 PCP: Reginia Naas, MD  Admit HPI / Brief Narrative: 80 y.o. WF PMHx anxiety, diabetes type 2 with peripheral circulatory disorder, HLD, OSA (noncompliant with CPAP), chronic systolic CHF, OSA, atrial fibrillation with RVR, Hx MI. Presented from home. She was recently discharged from [facility]. Her husband has been caring for her. At baseline, she requires help getting out of bed, she spends the entire day in a wheelchair. She wears a diaper and has decubitus ulcers.  She presented to the hospital after feeling tired, she was found by EMS to be tachycardic with a rate of 200- was given adenosine without improvement. She then was given Cardizem and was found to be in a fib in the ER. She denied chest pain, no SOB, no fevers, no chills  She was seen by cardiology who started patient on amiodarone. She was also thought to be septic  Admitting MD spoke with her husband who confirmed that patient is DNR/DNI.   HPI/Subjective: Alert, endorses left leg tenderness has essentially resolved. No CP or SOB. Continues to refuse HS CPAP  Assessment/Plan:  Sepsis / Pseudomonas bacteremia and klebsiella UTI -Cefepime dc'd 5/9 due to resistance pattern for UTI - Zosyn per pharmacy -Obtained repeat blood cultures Monday 5/11, if negative consider PICC line placement   Atrial fibrillation with rapid ventricular response  -now rate controled -required Amiodarone but now off-cont digoxin  -suspect being driven by sepsis and dehydration  -has underlying chronic AF - ASA only for anticoag due to recent hx of falls -started Coreg 3.25 mg BID 5/10  Noting was on Cardizem 30 mg TID at home  Cellulitis of left leg - presumed Pseudomonas but can't r/o  MRSA -very edematous and painful with marked erythema  -too painful to pursue venous duplex to r/o DVT  -Oxy IR and IV MSO4 for pain  -Continue  daptomycin to cover for possible MRSA etiology since nasal swab positive for MRSA  Decubitus skin ulcer  -per [facility] RN recs -Continue antifungal powder under her pannus between thighs, mons pubis.  Acute respiratory failure with hypoxia:  -Resolved  Sleep apnea  -CPAP per respiratory -pt continues to refuse use- should likely dc  Chronic systolic congestive heart failure, NYHA class 1  -Echocardiogram; systolic CHF, LVEF 67% -Coreg 3.125 mg BID  PULMONARY HYPERTENSION  -refusing CPAP at HS; CPAP per respiratory ordered  -HF compensated-EF per ECHO Jan ~40%  - See chronic systolic CHF  CKD (chronic kidney disease), stage III  -baseline 43/1.23  -current function is stable  -change Vanco to Cubicin to minimize acute renal dysfunction esp in concurrent hypotension   Type II or unspecified type diabetes mellitus with peripheral circulatory disorders, uncontrolled(250.72)  -CBGs controlled  -HgbA1c was 6.6 in January  -SSI if needed  -HgbA1c pending -lipid panel: HDL 18, LDL 56, TC 90  Depression -Continue Paxil 20 mg daily  Anxiety -See depression   Code Status: FULL Family Communication: no family present at time of exam Disposition Plan: Transfer to Telemetry  Consultants: N./A.  Procedure/Significant Events: 5/7 PCXR -Stable cardiomegaly and interstitial pulmonary edema with bibasilar atelectasis w/suspected trace pleural effusions  03/16/2014 MRI tibia fibula left lower extremity without contrast -Intense bilateral subcutaneous edema > left.  - No abscess or evidence of osteomyelitis or myositis   Echocardiogram 03/17/2014 - Left ventricle: Paradoxical septal motion. Hypokinesis base inferior segment.  -LVEF= 50%. - Mitral valve: Mild regurgitation. -  Left atrium: moderately to severely dilated. - Right ventricle: moderately -severely dilated. Systolic function moderately-severely reduced. - Right atrium: moderately to severely dilated. - Tricuspid valve:  Moderate-severe regurgitation. - Pulmonary arteries: PA peak pressure: 11mm Hg (S). - Inferior vena cava: The vessel was dilated; Significant dilitation of the IVC.  Culture 5/7 MRSA by PCR positive 5/7 blood culture left hand/right arm positive Pseudomonas aeruginosa 5/7 urine culture positive gram-negative rods Klebsiella Pneumoniae resistant to cefepime  Antibiotics: Cefepime 5/7 >>> stopped 5/10 Vancomycin 5/7 >>> stopped 5/8 Daptomycin 5/8 >>>  Zosyn 5/10>>  DVT prophylaxis: Heparin   Devices N./A.   LINES / TUBES:  5/7 18 Ga left antecubital 5/8 24 Ga right upper  Continuous Infusions: . sodium chloride 100 mL/hr at 03/17/14 0113    Objective: VITAL SIGNS: Temp: 97.2 F (36.2 C) (05/11 1150) Temp src: Axillary (05/11 1150) BP: 107/72 mmHg (05/11 1150) Pulse Rate: 90 (05/11 1150) SPO2; 100% on 2 L [place] FIO2:   Intake/Output Summary (Last 24 hours) at 03/19/14 1245 Last data filed at 03/19/14 1151  Gross per 24 hour  Intake   1003 ml  Output    725 ml  Net    278 ml   Exam: General: in no acute respiratory distress Lungs: Clear to auscultation bilaterally without wheezes or crackles - distant BS th/o  Cardiovascular: Tachycardic, Regular rhythm without murmur gallop or rub normal S1 and S2 Abdomen: Morbidly obese, Nontender, nondistended, soft, bowel sounds positive, no rebound, no ascites, no appreciable mass Extremities: Bilateral lower extremity edema. Left leg from knee -foot, previous erythema has nearly resolved with decreased pain, patient has candidal rash between thighs under pannus  Data Reviewed: Basic Metabolic Panel:  Recent Labs Lab 03/16/14 0345 03/16/14 1015 03/17/14 0920 03/18/14 0520 03/19/14 0345  NA 137 133* 137 137 137  K 5.2 4.8 4.3 4.3 4.6  CL 103 100 104 105 105  CO2 20 21 21 19 22   GLUCOSE 95 146* 112* 78 88  BUN 39* 42* 40* 32* 25*  CREATININE 1.34* 1.37* 1.22* 1.06 1.00  CALCIUM 7.8* 7.8* 7.8* 7.7* 7.7*  MG  --   --   1.7 1.7 1.8   Liver Function Tests:  Recent Labs Lab 03/16/14 1015 03/18/14 0520 03/19/14 0345  AST 32 32 34  ALT 9 9 10   ALKPHOS 107 107 106  BILITOT 0.6 0.6 0.5  PROT 4.6* 4.6* 4.6*  ALBUMIN 1.5* 1.4* 1.4*   CBC:  Recent Labs Lab 03/15/14 1331 03/16/14 0345 03/17/14 0318 03/18/14 0520 03/19/14 0345  WBC 18.8* 18.3* 10.0 6.4 4.7  NEUTROABS 17.8*  --   --  5.1 3.6  HGB 14.6 11.9* 11.8* 11.9* 11.8*  HCT 43.3 36.4 35.6* 35.9* 36.1  MCV 89.3 90.3 89.9 90.2 90.7  PLT 194 165 148* 143* 153   Cardiac Enzymes:  Recent Labs Lab 03/15/14 1331 03/16/14 0345 03/16/14 1015  CKTOTAL  --   --  40  TROPONINI <0.30 <0.30 <0.30   BNP (last 3 results)  Recent Labs  04/27/13 1733 11/14/13 1002 03/15/14 1331  PROBNP 2160.0* 1616.0* 3771.0*   CBG:  Recent Labs Lab 03/18/14 2108 03/19/14 0007 03/19/14 0354 03/19/14 0907 03/19/14 1148  GLUCAP 116* 113* 95 104* 109*    Recent Results (from the past 240 hour(s))  URINE CULTURE     Status: None   Collection Time    03/15/14  3:24 PM      Result Value Ref Range Status   Specimen Description URINE, CATHETERIZED  Final   Special Requests NONE   Final   Culture  Setup Time     Final   Value: 03/15/2014 16:46     Performed at [place]     Final   Value: >=100,000 COLONIES/ML     Performed at [facility]   Culture     Final   Value: KLEBSIELLA PNEUMONIAE     Note: Confirmed Extended Spectrum Beta-Lactamase Producer (ESBL)     Performed at [facility]   Report Status 03/17/2014 FINAL   Final   Organism ID, Bacteria KLEBSIELLA PNEUMONIAE   Final  CULTURE, BLOOD (ROUTINE X 2)     Status: None   Collection Time    03/15/14  3:39 PM      Result Value Ref Range Status   Specimen Description BLOOD RIGHT ARM   Final   Special Requests BOTTLES DRAWN AEROBIC AND ANAEROBIC 5CCS   Final   Culture  Setup Time     Final   Value: 03/15/2014 20:47     Performed at [facility]   Culture     Final   Value: PSEUDOMONAS AERUGINOSA     Note: SUSCEPTIBILITIES PERFORMED ON PREVIOUS CULTURE WITHIN THE LAST 5 DAYS.     Note: Gram Stain Report Called to,Read Back By and Verified With: RONCALLO @1410  ON [id number] BY [facility]     Performed at [facility]   Report Status 03/18/2014 FINAL   Final  CULTURE, BLOOD (ROUTINE X 2)     Status: None   Collection Time    03/15/14  3:46 PM      Result Value Ref Range Status   Specimen Description BLOOD LEFT HAND   Final   Special Requests BOTTLES DRAWN AEROBIC ONLY 5CC   Final   Culture  Setup Time     Final   Value: 03/15/2014 20:48     Performed at [facility]   Culture     Final   Value: PSEUDOMONAS AERUGINOSA     Note: Gram Stain Report Called to,Read Back By and Verified With: RONCALLO @1410  ON [id number] BY [facility]     Performed at [facility]   Report Status 03/18/2014 FINAL   Final   Organism ID, Bacteria PSEUDOMONAS AERUGINOSA   Final  MRSA PCR SCREENING     Status: Abnormal   Collection Time    03/15/14 11:06 PM      Result Value Ref Range Status   MRSA by PCR POSITIVE (*) NEGATIVE Final   Comment:            The GeneXpert MRSA Assay (FDA     approved for NASAL specimens     only), is one component of a     comprehensive MRSA colonization     surveillance program. It is not     intended to diagnose MRSA     infection nor to guide or     monitor treatment for     MRSA infections.     RESULT CALLED TO, READ BACK BY AND VERIFIED WITH:     M.EVANGALISTA,RN AT 0044 BY L.PITT 03/16/14     Studies:  Recent x-ray studies have been reviewed in detail by the Attending Physician  Scheduled Meds:  Scheduled Meds: . antiseptic oral rinse  15 mL Mouth Rinse q12n4p  . carvedilol  3.125 mg Oral BID WC  . chlorhexidine  15 mL Mouth Rinse BID  . Chlorhexidine  Gluconate Cloth  6 each Topical [id number]  . DAPTOmycin (CUBICIN)  IV  495 mg Intravenous Q24H  . digoxin  0.125 mg Intravenous Daily  .  feeding supplement (GLUCERNA SHAKE)  237 mL Oral BID BM  . heparin  5,000 Units Subcutaneous 3 times per day  . insulin aspart  0-5 Units Subcutaneous QHS  . insulin aspart  0-9 Units Subcutaneous TID WC  . mupirocin ointment  1 application Nasal BID  . PARoxetine  20 mg Oral Daily  . piperacillin-tazobactam (ZOSYN)  IV  3.375 g Intravenous 3 times per day  . sodium chloride  3 mL Intravenous Q12H    Time spent on care of this patient: 35 mins   Samella Parr , ANP  [facility] Office  [phone]  On-Call/Text Page:      [email or web address]      password [email or web address]  If 7PM-7AM, please contact night-coverage [email or web address] Password [email or web address] 03/19/2014, 12:45 PM   LOS: 4 days   I have personally examined this patient and reviewed the entire database. I have reviewed the above note, made any necessary editorial changes, and agree with its content.  Cherene Altes, MD [facility]

## 2014-03-19 NOTE — Progress Notes (Signed)
Patient's husband, Lodie Waheed, was notified of this transfer.

## 2014-03-19 NOTE — Progress Notes (Signed)
CSW called pt's husband and informed him LTACH referral was made. Pt and husband hoping pt will be clinically appropriate for this. Informed husband 14 days remaining with Medicare days for SNF. CSW following and called RNCM to inform her family interest/referral made to [facility] by pt's previous RNCM.   Ky Barban, MSW, [facility] Clinical Social Worker [phone]

## 2014-03-19 NOTE — Care Management Note (Signed)
Patient is active with [facility] - SN/PT/OT/HHA.  Resumption of care orders requested upon discharge.

## 2014-03-20 ENCOUNTER — Inpatient Hospital Stay
Admission: AD | Admit: 2014-03-20 | Discharge: 2014-04-11 | Disposition: A | Discharge status: Skilled Nursing Facility | Payer: Self-pay | Source: Ambulatory Visit | Attending: Internal Medicine | Admitting: Internal Medicine

## 2014-03-20 LAB — COMPREHENSIVE METABOLIC PANEL WITH GFR
ALT: 10 U/L (ref 0–35)
AST: 34 U/L (ref 0–37)
Albumin: 1.5 g/dL — ABNORMAL LOW (ref 3.5–5.2)
Alkaline Phosphatase: 107 U/L (ref 39–117)
BUN: 20 mg/dL (ref 6–23)
CO2: 21 meq/L (ref 19–32)
Calcium: 7.8 mg/dL — ABNORMAL LOW (ref 8.4–10.5)
Chloride: 104 meq/L (ref 96–112)
Creatinine, Ser: 0.93 mg/dL (ref 0.50–1.10)
GFR calc Af Amer: 67 mL/min — ABNORMAL LOW
GFR calc non Af Amer: 58 mL/min — ABNORMAL LOW
Glucose, Bld: 103 mg/dL — ABNORMAL HIGH (ref 70–99)
Potassium: 4.5 meq/L (ref 3.7–5.3)
Sodium: 136 meq/L — ABNORMAL LOW (ref 137–147)
Total Bilirubin: 0.4 mg/dL (ref 0.3–1.2)
Total Protein: 5 g/dL — ABNORMAL LOW (ref 6.0–8.3)

## 2014-03-20 LAB — CBC WITH DIFFERENTIAL/PLATELET
Basophils Absolute: 0 K/uL (ref 0.0–0.1)
Basophils Relative: 1 % (ref 0–1)
Eosinophils Absolute: 0.2 K/uL (ref 0.0–0.7)
Eosinophils Relative: 5 % (ref 0–5)
HCT: 37 % (ref 36.0–46.0)
Hemoglobin: 12 g/dL (ref 12.0–15.0)
Lymphocytes Relative: 12 % (ref 12–46)
Lymphs Abs: 0.6 K/uL — ABNORMAL LOW (ref 0.7–4.0)
MCH: 29.3 pg (ref 26.0–34.0)
MCHC: 32.4 g/dL (ref 30.0–36.0)
MCV: 90.2 fL (ref 78.0–100.0)
Monocytes Absolute: 0.6 K/uL (ref 0.1–1.0)
Monocytes Relative: 11 % (ref 3–12)
Neutro Abs: 3.5 K/uL (ref 1.7–7.7)
Neutrophils Relative %: 71 % (ref 43–77)
Platelets: 152 K/uL (ref 150–400)
RBC: 4.1 MIL/uL (ref 3.87–5.11)
RDW: 15.1 % (ref 11.5–15.5)
WBC: 4.9 K/uL (ref 4.0–10.5)

## 2014-03-20 LAB — DIGOXIN LEVEL: Digoxin Level: 0.7 ng/mL — ABNORMAL LOW (ref 0.8–2.0)

## 2014-03-20 LAB — GLUCOSE, CAPILLARY
GLUCOSE-CAPILLARY: 112 mg/dL — AB (ref 70–99)
Glucose-Capillary: 94 mg/dL (ref 70–99)
Glucose-Capillary: 124 mg/dL — ABNORMAL HIGH (ref 70–99)

## 2014-03-20 MED ORDER — HEPARIN SODIUM (PORCINE) 5000 UNIT/ML IJ SOLN: 5000.0000 [IU] | Freq: Three times a day (TID) | INTRAMUSCULAR | Status: DC

## 2014-03-20 MED ORDER — SODIUM CHLORIDE 0.9 % IV SOLN: 495.0000 mg | INTRAVENOUS | Status: DC

## 2014-03-20 MED ORDER — SODIUM CHLORIDE 0.9 % IJ SOLN: 3.0000 mL | Freq: Two times a day (BID) | INTRAMUSCULAR | Status: DC

## 2014-03-20 MED ORDER — PAROXETINE HCL 20 MG PO TABS: 20.0000 mg | ORAL_TABLET | Freq: Every day | ORAL | Status: DC

## 2014-03-20 MED ORDER — PIPERACILLIN-TAZOBACTAM 3.375 G IVPB: 3.3750 g | Freq: Three times a day (TID) | INTRAVENOUS | Status: DC

## 2014-03-20 MED ORDER — ACETAMINOPHEN 325 MG PO TABS: 650.0000 mg | ORAL_TABLET | Freq: Four times a day (QID) | ORAL | Status: DC | PRN

## 2014-03-20 MED ORDER — GLUCERNA SHAKE PO LIQD: 237.0000 mL | Freq: Two times a day (BID) | ORAL | Status: DC

## 2014-03-20 MED ORDER — ONDANSETRON HCL 4 MG PO TABS: 4.0000 mg | ORAL_TABLET | Freq: Four times a day (QID) | ORAL | Status: DC | PRN

## 2014-03-20 MED ORDER — DIGOXIN 0.25 MG/ML IJ SOLN: 0.1250 mg | Freq: Every day | INTRAMUSCULAR | Status: DC

## 2014-03-20 MED ORDER — CARVEDILOL 3.125 MG PO TABS: 3.1250 mg | ORAL_TABLET | Freq: Two times a day (BID) | ORAL | Status: AC

## 2014-03-20 MED ORDER — OXYCODONE HCL 5 MG PO TABS: 5.0000 mg | ORAL_TABLET | ORAL | Status: DC | PRN

## 2014-03-20 MED ORDER — INSULIN ASPART 100 UNIT/ML ~~LOC~~ SOLN: 0.0000 [IU] | Freq: Every day | SUBCUTANEOUS | Status: DC

## 2014-03-20 MED ORDER — INSULIN ASPART 100 UNIT/ML ~~LOC~~ SOLN: 0.0000 [IU] | Freq: Three times a day (TID) | SUBCUTANEOUS | Status: DC

## 2014-03-20 MED ORDER — MORPHINE SULFATE 2 MG/ML IJ SOLN: 1.0000 mg | INTRAMUSCULAR | Status: DC | PRN

## 2014-03-20 NOTE — Discharge Summary (Signed)
Physician Discharge Summary  Tara Huff [id number] DOB: [birth year] DOA: 03/15/2014  PCP: Reginia Naas, MD  Admit date: 03/15/2014 Discharge date: 03/20/2014  Time spent: greater than 30 minutes  Discharge Diagnoses:  Sepsis    ANXIETY   PULMONARY HYPERTENSION   Sleep apnea   CKD (chronic kidney disease), stage III   Type II or unspecified type diabetes mellitus with peripheral circulatory disorders, uncontrolled(250.72)   Chronic pain   UTI (lower urinary tract infection)   Atrial fibrillation with rapid ventricular response   Cellulitis of left leg   Chronic systolic congestive heart failure, NYHA class 1   Decubitus skin ulcer   Hypotension   Acute respiratory failure with hypoxia Morbid obesity  Discharge Condition: stable  Filed Weights   03/16/14 0500 03/17/14 0500 03/19/14 0356  Weight: 123.8 kg (272 lb 14.9 oz) 126.8 kg (279 lb 8.7 oz) 131.4 kg (289 lb 11 oz)    History of present illness/hospital course 80 y.o. WF PMHx anxiety, diabetes type 2 with peripheral circulatory disorder, HLD, OSA (noncompliant with CPAP), chronic systolic CHF, OSA, atrial fibrillation with RVR, Hx MI.  Presented from home. She was recently discharged from [facility]. Her husband has been caring for her. At baseline, she requires help getting out of bed, she spends the entire day in a wheelchair. She wears a diaper and has decubitus ulcers.  She presented to the hospital after feeling tired, she was found by EMS to be tachycardic with a rate of 200- was given adenosine without improvement. She then was given Cardizem and was found to be in a fib in the ER. She denied chest pain, no SOB, no fevers, no chills  She was seen by cardiology who started patient on amiodarone. She was also thought to be septic  Admitting MD spoke with her husband who confirmed that patient is DNR/DNI.   Sepsis / Pseudomonas bacteremia and klebsiella UTI  -Cefepime dc'd 5/9 due to resistance pattern for UTI -  Zosyn started -Obtained repeat blood cultures Monday 5/11, if negative consider PICC line placement and 2 weeks total course  Atrial fibrillation with rapid ventricular response  -required Amiodarone but now off-cont digoxin  -has underlying chronic AF - ASA only for anticoag due to recent hx of falls  -started Coreg 3.25 mg BID 5/10 Noting was on Cardizem 30 mg TID at home   Cellulitis of left leg - presumed Pseudomonas but can't r/o MRSA  -very edematous and painful with marked erythema  -Oxy IR and IV MSO4 for pain  -Continue daptomycin to cover for possible MRSA etiology since nasal swab positive for MRSA   Decubitus skin ulcer, sacrum -per [facility] RN recs, foam dressings -Continue antifungal powder  Acute respiratory failure with hypoxia:  Due to acute on chronic systolic heart failur Resolved  Sleep apnea  Refuses CPAP  Chronic systolic congestive heart failure, NYHA class 1  Lasix held during admission, as BP too low for diuresis. May resume at [facility]. Albumin 1.5, making volume management difficult  PULMONARY HYPERTENSION  -refusing CPAP at HS; CPAP per respiratory ordered  -HF compensated-EF per ECHO Jan ~40%  - See chronic systolic CHF   CKD (chronic kidney disease), stage III  -baseline 43/1.23  -current function is stable  -Vanco changed to Cubicin to minimize acute renal dysfunction esp in concurrent hypotension   Type II or unspecified type diabetes mellitus with peripheral circulatory disorders, uncontrolled(250.72)  -CBGs controlled on SSI  Depression  -Continue Paxil 20 mg daily   Anxiety  Code Status: DNR Family Communication: no family present at time of exam  Disposition Plan: transfer to [facility]  Consultants:  Cardiology  Procedure none Culture  5/7 MRSA by PCR positive  5/7 blood culture left hand/right arm positive Pseudomonas aeruginosa 5/7 urine culture positive gram-negative rods Klebsiella Pneumoniae resistant to cefepime   Antibiotics:   Cefepime 5/7 >>> stopped 5/10  Vancomycin 5/7 >>> stopped 5/8  Daptomycin 5/8 >>>  Zosyn 5/10>>   Discharge Exam: Filed Vitals:   03/20/14 0848  BP: 98/66  Pulse: 116  Temp:   Resp:     General: alert, oriented Cardiovascular: RRR  Respiratory: CTA ABd obese Ext right arm and both legs edematous. Hyperpigmentation, both legs   Discharge Orders   Future Appointments Provider Department Dept Phone   03/27/2014 10:45 AM Sueanne Margarita, MD [facility] [phone]   Future Orders Complete By Expires   Diet - low sodium heart healthy  As directed    Diet Carb Modified  As directed    Walk with assistance  As directed        Medication List    STOP taking these medications       cefUROXime 250 MG tablet  Commonly known as:  CEFTIN     diltiazem 30 MG tablet  Commonly known as:  CARDIZEM     diphenoxylate-atropine 2.5-0.025 MG per tablet  Commonly known as:  LOMOTIL     glimepiride 2 MG tablet  Commonly known as:  AMARYL      TAKE these medications       acetaminophen 325 MG tablet  Commonly known as:  TYLENOL  Take 2 tablets (650 mg total) by mouth every 6 (six) hours as needed for mild pain (or Fever >/= 101).     ALPRAZolam 0.25 MG tablet  Commonly known as:  XANAX  Take 1-2 tablets (0.25-0.5 mg total) by mouth 2 (two) times daily as needed for anxiety. Take one tablet by mouth every 12 hours as needed for anxiety     aspirin 81 MG EC tablet  Take 1 tablet (81 mg total) by mouth daily.     carvedilol 3.125 MG tablet  Commonly known as:  COREG  Take 1 tablet (3.125 mg total) by mouth 2 (two) times daily with a meal.     DAPTOmycin 495 mg in sodium chloride 0.9 % 100 mL  Inject 495 mg into the vein daily.     digoxin 0.25 MG/ML injection  Commonly known as:  LANOXIN  Inject 0.5 mLs (0.125 mg total) into the vein daily.     feeding supplement (GLUCERNA SHAKE) Liqd  Take 237 mLs by mouth 2 (two) times daily between meals.      furosemide 20 MG tablet  Commonly known as:  LASIX  Take 2 tablets (40 mg total) by mouth daily.     heparin 5000 UNIT/ML injection  Inject 1 mL (5,000 Units total) into the skin every 8 (eight) hours.     insulin aspart 100 UNIT/ML injection  Commonly known as:  novoLOG  Inject 0-9 Units into the skin 3 (three) times daily with meals.     insulin aspart 100 UNIT/ML injection  Commonly known as:  novoLOG  Inject 0-5 Units into the skin at bedtime.     insulin aspart 100 UNIT/ML injection  Commonly known as:  novoLOG  Inject 0-9 Units into the skin 3 (three) times daily with meals.     morphine 2 MG/ML injection  Inject 0.5-1 mLs (1-2 mg total) into the vein every 2 (two) hours as needed (and breakthough pain not relieved by oxy IR).     ondansetron 4 MG tablet  Commonly known as:  ZOFRAN  Take 1 tablet (4 mg total) by mouth every 6 (six) hours as needed for nausea.     oxyCODONE 5 MG immediate release tablet  Commonly known as:  Oxy IR/ROXICODONE  Take 1-2 tablets (5-10 mg total) by mouth every 4 (four) hours as needed for moderate pain.     PARoxetine 20 MG tablet  Commonly known as:  PAXIL  Take 1 tablet (20 mg total) by mouth daily.     piperacillin-tazobactam 3.375 GM/50ML IVPB  Commonly known as:  ZOSYN  Inject 50 mLs (3.375 g total) into the vein every 8 (eight) hours.     sodium chloride 0.9 % injection  Inject 3 mLs into the vein every 12 (twelve) hours.     Vitamin D 1000 UNITS capsule  Take 1,000 Units by mouth daily.       No Known Allergies    The results of significant diagnostics from this hospitalization (including imaging, microbiology, ancillary and laboratory) are listed below for reference.    Significant Diagnostic Studies: Mr Tibia Fibula Left Wo Contrast  03/17/2014   CLINICAL DATA:  Left lower extremity pain.  Question cellulitis.  EXAM: MRI OF LOWER LEFT EXTREMITY WITHOUT CONTRAST  TECHNIQUE: Multiplanar, multisequence MR imaging was  performed. No intravenous contrast was administered.  COMPARISON:  MRI left lower leg 04/07/2011.  FINDINGS: The study is degraded by patient motion despite repeating multiple sequences. The axial and coronal sequences incidentally include the right lower leg.  The patient has intense subcutaneous edema about both the right and left lower legs. Changes appear worse on the left. No focal fluid collection is identified. Musculature of the lower legs demonstrates fatty replacement bilaterally. No intramuscular edema or fluid collection is seen. No bone marrow signal abnormality to suggest osteomyelitis is present. No muscle or tendon tear is identified. There is no fracture or focal bony lesion.  IMPRESSION: Intense bilateral subcutaneous edema is worse on the left. Changes could be due to dependent change and/or cellulitis. No abscess or evidence of osteomyelitis or myositis is present.   Electronically Signed   By: Inge Rise M.D.   On: 03/17/2014 08:20   Dg Chest Port 1 View  03/17/2014   CLINICAL DATA:  Shortness of breath.  EXAM: PORTABLE CHEST - 1 VIEW  COMPARISON:  03/15/2014.  FINDINGS: Stable enlarged cardiac silhouette with an improved inspiration. Stable mildly prominent pulmonary vasculature and interstitial markings. Mild thoracic spine degenerative changes.  IMPRESSION: No acute abnormality. Stable cardiomegaly, pulmonary vascular congestion and mild chronic interstitial lung disease/interstitial pulmonary edema.   Electronically Signed   By: Enrique Sack M.D.   On: 03/17/2014 23:35   Dg Chest Port 1 View  03/15/2014   CLINICAL DATA:  Shortness of breath and weakness.  EXAM: PORTABLE CHEST - 1 VIEW  COMPARISON:  DG CHEST 1 VIEW dated 01/28/2014  FINDINGS: Cardiac silhouette remains at least moderately enlarged, mediastinal silhouette is nonsuspicious. Diffuse mild interstitial prominence with central pulmonary vasculature congestion. Strandy densities in lung bases, with probable trace pleural  effusions. No pneumothorax.  Multiple EKG lines overlie the patient and may obscure subtle underlying pathology. Degenerative change of the right shoulder. Mild degenerative changes thoracic spine.  IMPRESSION: Stable cardiomegaly and interstitial pulmonary edema with bibasilar atelectasis with suspected trace pleural effusions.  Electronically Signed   By: Elon Alas   On: 03/15/2014 14:10   Echo  Echocardiogram 03/17/2014  - Left ventricle: Paradoxical septal motion. Hypokinesis base inferior segment.  -LVEF= 50%. - Mitral valve: Mild regurgitation. - Left atrium: moderately to severely dilated. - Right ventricle: moderately -severely dilated. Systolic function moderately-severely reduced. - Right atrium: moderately to severely dilated. - Tricuspid valve: Moderate-severe regurgitation. - Pulmonary arteries: PA peak pressure: 62mm Hg (S). - Inferior vena cava: The vessel was dilated; Significant dilitation of the IVC.  Microbiology: Recent Results (from the past 240 hour(s))  URINE CULTURE     Status: None   Collection Time    03/15/14  3:24 PM      Result Value Ref Range Status   Specimen Description URINE, CATHETERIZED   Final   Special Requests NONE   Final   Culture  Setup Time     Final   Value: 03/15/2014 16:46     Performed at [place]     Final   Value: >=100,000 COLONIES/ML     Performed at [facility]   Culture     Final   Value: KLEBSIELLA PNEUMONIAE     Note: Confirmed Extended Spectrum Beta-Lactamase Producer (ESBL)     Performed at [facility]   Report Status 03/17/2014 FINAL   Final   Organism ID, Bacteria KLEBSIELLA PNEUMONIAE   Final  CULTURE, BLOOD (ROUTINE X 2)     Status: None   Collection Time    03/15/14  3:39 PM      Result Value Ref Range Status   Specimen Description BLOOD RIGHT ARM   Final   Special Requests BOTTLES DRAWN AEROBIC AND ANAEROBIC 5CCS   Final   Culture  Setup Time     Final   Value:  03/15/2014 20:47     Performed at [facility]   Culture     Final   Value: PSEUDOMONAS AERUGINOSA     Note: SUSCEPTIBILITIES PERFORMED ON PREVIOUS CULTURE WITHIN THE LAST 5 DAYS.     Note: Gram Stain Report Called to,Read Back By and Verified With: RONCALLO @1410  ON [id number] BY [facility]     Performed at [facility]   Report Status 03/18/2014 FINAL   Final  CULTURE, BLOOD (ROUTINE X 2)     Status: None   Collection Time    03/15/14  3:46 PM      Result Value Ref Range Status   Specimen Description BLOOD LEFT HAND   Final   Special Requests BOTTLES DRAWN AEROBIC ONLY 5CC   Final   Culture  Setup Time     Final   Value: 03/15/2014 20:48     Performed at [facility]   Culture     Final   Value: PSEUDOMONAS AERUGINOSA     Note: Gram Stain Report Called to,Read Back By and Verified With: RONCALLO @1410  ON [id number] BY [facility]     Performed at [facility]   Report Status 03/18/2014 FINAL   Final   Organism ID, Bacteria PSEUDOMONAS AERUGINOSA   Final  MRSA PCR SCREENING     Status: Abnormal   Collection Time    03/15/14 11:06 PM      Result Value Ref Range Status   MRSA by PCR POSITIVE (*) NEGATIVE Final   Comment:            The GeneXpert MRSA Assay (FDA     approved for  NASAL specimens     only), is one component of a     comprehensive MRSA colonization     surveillance program. It is not     intended to diagnose MRSA     infection nor to guide or     monitor treatment for     MRSA infections.     RESULT CALLED TO, READ BACK BY AND VERIFIED WITH:     M.EVANGALISTA,RN AT 0044 BY L.PITT 03/16/14  CULTURE, BLOOD (ROUTINE X 2)     Status: None   Collection Time    03/19/14  1:50 PM      Result Value Ref Range Status   Specimen Description BLOOD LEFT HAND   Final   Special Requests     Final   Value: BOTTLES DRAWN AEROBIC AND ANAEROBIC 10CC BLUE 5CC RED   Culture  Setup Time     Final   Value: 03/19/2014 16:29     Performed at [facility]    Culture     Final   Value:        BLOOD CULTURE RECEIVED NO GROWTH TO DATE CULTURE WILL BE HELD FOR 5 DAYS BEFORE ISSUING A FINAL NEGATIVE REPORT     Performed at [facility]   Report Status PENDING   Incomplete  CULTURE, BLOOD (ROUTINE X 2)     Status: None   Collection Time    03/19/14  2:00 PM      Result Value Ref Range Status   Specimen Description BLOOD RIGHT HAND   Final   Special Requests     Final   Value: BOTTLES DRAWN AEROBIC AND ANAEROBIC 10CC BLUE 8CC RED   Culture  Setup Time     Final   Value: 03/19/2014 16:29     Performed at [facility]   Culture     Final   Value:        BLOOD CULTURE RECEIVED NO GROWTH TO DATE CULTURE WILL BE HELD FOR 5 DAYS BEFORE ISSUING A FINAL NEGATIVE REPORT     Performed at [facility]   Report Status PENDING   Incomplete     Labs: Basic Metabolic Panel:  Recent Labs Lab 03/16/14 1015 03/17/14 0920 03/18/14 0520 03/19/14 0345 03/20/14 0438  NA 133* 137 137 137 136*  K 4.8 4.3 4.3 4.6 4.5  CL 100 104 105 105 104  CO2 21 21 19 22 21   GLUCOSE 146* 112* 78 88 103*  BUN 42* 40* 32* 25* 20  CREATININE 1.37* 1.22* 1.06 1.00 0.93  CALCIUM 7.8* 7.8* 7.7* 7.7* 7.8*  MG  --  1.7 1.7 1.8  --    Liver Function Tests:  Recent Labs Lab 03/16/14 1015 03/18/14 0520 03/19/14 0345 03/20/14 0438  AST 32 32 34 34  ALT 9 9 10 10   ALKPHOS 107 107 106 107  BILITOT 0.6 0.6 0.5 0.4  PROT 4.6* 4.6* 4.6* 5.0*  ALBUMIN 1.5* 1.4* 1.4* 1.5*   No results found for this basename: LIPASE, AMYLASE,  in the last 168 hours No results found for this basename: AMMONIA,  in the last 168 hours CBC:  Recent Labs Lab 03/15/14 1331 03/16/14 0345 03/17/14 0318 03/18/14 0520 03/19/14 0345 03/20/14 0438  WBC 18.8* 18.3* 10.0 6.4 4.7 4.9  NEUTROABS 17.8*  --   --  5.1 3.6 3.5  HGB 14.6 11.9* 11.8* 11.9* 11.8* 12.0  HCT 43.3 36.4 35.6* 35.9* 36.1 37.0  MCV 89.3 90.3 89.9 90.2  90.7 90.2  PLT 194 165 148* 143* 153 152    Cardiac Enzymes:  Recent Labs Lab 03/15/14 1331 03/16/14 0345 03/16/14 1015  CKTOTAL  --   --  41  TROPONINI <0.30 <0.30 <0.30   BNP: BNP (last 3 results)  Recent Labs  04/27/13 1733 11/14/13 1002 03/15/14 1331  PROBNP 2160.0* 1616.0* 3771.0*   CBG:  Recent Labs Lab 03/19/14 1148 03/19/14 1616 03/19/14 2107 03/20/14 0626 03/20/14 1106  GLUCAP 109* 137* 125* 94 124*       Signed:  Mattalyn Anderegg L Trinton Prewitt  [facility] 03/20/2014, 1:07 PM

## 2014-03-20 NOTE — Progress Notes (Signed)
Per CM, patient is going to [facility] today. CSW signing off at this time, as patient does not need snf placement. Please re consult if social work needs arise.  Jeanette Caprice, MSW, [place]

## 2014-03-20 NOTE — Progress Notes (Signed)
PT Cancellation Note  Patient Details Name: Tara Huff MRN: [id number] DOB: 10-Mar-1934   Cancelled Treatment:    Reason Eval/Treat Not Completed: Other (comment) (Pt declined stating she wanted to sleep and get cleaned up.)   [place] 03/20/2014, 1:04 PM

## 2014-03-21 ENCOUNTER — Other Ambulatory Visit (HOSPITAL_COMMUNITY): Payer: Self-pay

## 2014-03-21 LAB — CBC WITH DIFFERENTIAL/PLATELET
Basophils Absolute: 0 10*3/uL (ref 0.0–0.1)
Basophils Relative: 0 % (ref 0–1)
Eosinophils Absolute: 0.3 10*3/uL (ref 0.0–0.7)
Eosinophils Relative: 6 % — ABNORMAL HIGH (ref 0–5)
HCT: 38.1 % (ref 36.0–46.0)
HEMOGLOBIN: 12.4 g/dL (ref 12.0–15.0)
LYMPHS ABS: 0.6 10*3/uL — AB (ref 0.7–4.0)
Lymphocytes Relative: 13 % (ref 12–46)
MCH: 29.2 pg (ref 26.0–34.0)
MCHC: 32.5 g/dL (ref 30.0–36.0)
MCV: 89.6 fL (ref 78.0–100.0)
MONOS PCT: 15 % — AB (ref 3–12)
Monocytes Absolute: 0.7 10*3/uL (ref 0.1–1.0)
NEUTROS ABS: 3.2 10*3/uL (ref 1.7–7.7)
NEUTROS PCT: 66 % (ref 43–77)
Platelets: 167 10*3/uL (ref 150–400)
RBC: 4.25 MIL/uL (ref 3.87–5.11)
RDW: 15.1 % (ref 11.5–15.5)
WBC: 4.9 10*3/uL (ref 4.0–10.5)

## 2014-03-21 LAB — COMPREHENSIVE METABOLIC PANEL
ALBUMIN: 1.4 g/dL — AB (ref 3.5–5.2)
ALK PHOS: 103 U/L (ref 39–117)
ALT: 9 U/L (ref 0–35)
AST: 33 U/L (ref 0–37)
BUN: 16 mg/dL (ref 6–23)
CO2: 23 mEq/L (ref 19–32)
Calcium: 8.4 mg/dL (ref 8.4–10.5)
Chloride: 104 mEq/L (ref 96–112)
Creatinine, Ser: 0.91 mg/dL (ref 0.50–1.10)
GFR calc non Af Amer: 59 mL/min — ABNORMAL LOW (ref >90)
GFR, EST AFRICAN AMERICAN: 69 mL/min — AB (ref >90)
GLUCOSE: 96 mg/dL (ref 70–99)
POTASSIUM: 4.5 meq/L (ref 3.7–5.3)
Sodium: 136 mEq/L — ABNORMAL LOW (ref 137–147)
Total Bilirubin: 0.5 mg/dL (ref 0.3–1.2)
Total Protein: 5 g/dL — ABNORMAL LOW (ref 6.0–8.3)

## 2014-03-21 LAB — HEMOGLOBIN A1C
Hgb A1c MFr Bld: 6.2 % — ABNORMAL HIGH (ref <5.7)
Mean Plasma Glucose: 131 mg/dL — ABNORMAL HIGH (ref <117)

## 2014-03-21 LAB — LIPID PANEL
CHOLESTEROL: 103 mg/dL (ref 0–200)
HDL: 23 mg/dL — ABNORMAL LOW (ref >39)
LDL Cholesterol: 63 mg/dL (ref 0–99)
Total CHOL/HDL Ratio: 4.5 RATIO
Triglycerides: 85 mg/dL (ref <150)
VLDL: 17 mg/dL (ref 0–40)

## 2014-03-21 LAB — MAGNESIUM: MAGNESIUM: 1.8 mg/dL (ref 1.5–2.5)

## 2014-03-21 LAB — PROCALCITONIN: Procalcitonin: 0.9 ng/mL

## 2014-03-21 LAB — PROTIME-INR
INR: 1.4 (ref 0.00–1.49)
Prothrombin Time: 16.8 seconds — ABNORMAL HIGH (ref 11.6–15.2)

## 2014-03-21 LAB — PREALBUMIN: Prealbumin: 6.7 mg/dL — ABNORMAL LOW (ref 17.0–34.0)

## 2014-03-21 LAB — T4, FREE: Free T4: 0.66 ng/dL — ABNORMAL LOW (ref 0.80–1.80)

## 2014-03-21 LAB — PRO B NATRIURETIC PEPTIDE: Pro B Natriuretic peptide (BNP): 6081 pg/mL — ABNORMAL HIGH (ref 0–450)

## 2014-03-21 LAB — TSH: TSH: 2.46 u[IU]/mL (ref 0.350–4.500)

## 2014-03-21 LAB — FERRITIN: FERRITIN: 143 ng/mL (ref 10–291)

## 2014-03-21 LAB — APTT: aPTT: 33 seconds (ref 24–37)

## 2014-03-21 LAB — VITAMIN B12: VITAMIN B 12: 577 pg/mL (ref 211–911)

## 2014-03-21 LAB — CK: Total CK: 65 U/L (ref 7–177)

## 2014-03-21 LAB — PHOSPHORUS: Phosphorus: 2.5 mg/dL (ref 2.3–4.6)

## 2014-03-22 LAB — CBC WITH DIFFERENTIAL/PLATELET
Basophils Absolute: 0 10*3/uL (ref 0.0–0.1)
Basophils Relative: 0 % (ref 0–1)
EOS PCT: 4 % (ref 0–5)
Eosinophils Absolute: 0.3 10*3/uL (ref 0.0–0.7)
HEMATOCRIT: 34.3 % — AB (ref 36.0–46.0)
Hemoglobin: 11.3 g/dL — ABNORMAL LOW (ref 12.0–15.0)
LYMPHS ABS: 0.7 10*3/uL (ref 0.7–4.0)
Lymphocytes Relative: 12 % (ref 12–46)
MCH: 29.6 pg (ref 26.0–34.0)
MCHC: 32.9 g/dL (ref 30.0–36.0)
MCV: 89.8 fL (ref 78.0–100.0)
MONO ABS: 0.6 10*3/uL (ref 0.1–1.0)
Monocytes Relative: 11 % (ref 3–12)
Neutro Abs: 4.2 10*3/uL (ref 1.7–7.7)
Neutrophils Relative %: 73 % (ref 43–77)
PLATELETS: 189 10*3/uL (ref 150–400)
RBC: 3.82 MIL/uL — AB (ref 3.87–5.11)
RDW: 15.3 % (ref 11.5–15.5)
WBC: 5.8 10*3/uL (ref 4.0–10.5)

## 2014-03-22 LAB — BASIC METABOLIC PANEL
BUN: 16 mg/dL (ref 6–23)
CALCIUM: 8 mg/dL — AB (ref 8.4–10.5)
CO2: 21 mEq/L (ref 19–32)
Chloride: 105 mEq/L (ref 96–112)
Creatinine, Ser: 0.91 mg/dL (ref 0.50–1.10)
GFR calc Af Amer: 69 mL/min — ABNORMAL LOW (ref >90)
GFR calc non Af Amer: 59 mL/min — ABNORMAL LOW (ref >90)
Glucose, Bld: 88 mg/dL (ref 70–99)
Potassium: 4.7 mEq/L (ref 3.7–5.3)
SODIUM: 136 meq/L — AB (ref 137–147)

## 2014-03-22 LAB — CLOSTRIDIUM DIFFICILE BY PCR: Toxigenic C. Difficile by PCR: NEGATIVE

## 2014-03-22 LAB — DIGOXIN LEVEL: Digoxin Level: 0.6 ng/mL — ABNORMAL LOW (ref 0.8–2.0)

## 2014-03-22 LAB — FOLATE RBC: RBC Folate: 801 ng/mL — ABNORMAL HIGH (ref >280)

## 2014-03-23 ENCOUNTER — Other Ambulatory Visit (HOSPITAL_COMMUNITY): Payer: Self-pay

## 2014-03-24 LAB — BASIC METABOLIC PANEL
BUN: 23 mg/dL (ref 6–23)
CHLORIDE: 105 meq/L (ref 96–112)
CO2: 23 meq/L (ref 19–32)
Calcium: 8 mg/dL — ABNORMAL LOW (ref 8.4–10.5)
Creatinine, Ser: 0.98 mg/dL (ref 0.50–1.10)
GFR calc Af Amer: 63 mL/min — ABNORMAL LOW (ref >90)
GFR calc non Af Amer: 54 mL/min — ABNORMAL LOW (ref >90)
Glucose, Bld: 98 mg/dL (ref 70–99)
Potassium: 4.8 mEq/L (ref 3.7–5.3)
Sodium: 137 mEq/L (ref 137–147)

## 2014-03-24 LAB — CBC
HCT: 33.2 % — ABNORMAL LOW (ref 36.0–46.0)
HEMOGLOBIN: 10.6 g/dL — AB (ref 12.0–15.0)
MCH: 29 pg (ref 26.0–34.0)
MCHC: 31.9 g/dL (ref 30.0–36.0)
MCV: 91 fL (ref 78.0–100.0)
Platelets: 196 10*3/uL (ref 150–400)
RBC: 3.65 MIL/uL — ABNORMAL LOW (ref 3.87–5.11)
RDW: 15.5 % (ref 11.5–15.5)
WBC: 4.3 10*3/uL (ref 4.0–10.5)

## 2014-03-25 LAB — CULTURE, BLOOD (ROUTINE X 2)
CULTURE: NO GROWTH
Culture: NO GROWTH

## 2014-03-26 LAB — COMPREHENSIVE METABOLIC PANEL
ALT: 10 U/L (ref 0–35)
AST: 37 U/L (ref 0–37)
Albumin: 1.6 g/dL — ABNORMAL LOW (ref 3.5–5.2)
Alkaline Phosphatase: 130 U/L — ABNORMAL HIGH (ref 39–117)
BUN: 24 mg/dL — AB (ref 6–23)
CO2: 25 mEq/L (ref 19–32)
Calcium: 8.1 mg/dL — ABNORMAL LOW (ref 8.4–10.5)
Chloride: 102 mEq/L (ref 96–112)
Creatinine, Ser: 0.88 mg/dL (ref 0.50–1.10)
GFR calc Af Amer: 72 mL/min — ABNORMAL LOW (ref >90)
GFR, EST NON AFRICAN AMERICAN: 62 mL/min — AB (ref >90)
GLUCOSE: 87 mg/dL (ref 70–99)
Potassium: 4.7 mEq/L (ref 3.7–5.3)
Sodium: 136 mEq/L — ABNORMAL LOW (ref 137–147)
TOTAL PROTEIN: 5.1 g/dL — AB (ref 6.0–8.3)
Total Bilirubin: 0.4 mg/dL (ref 0.3–1.2)

## 2014-03-26 LAB — CBC
HEMATOCRIT: 33.4 % — AB (ref 36.0–46.0)
HEMOGLOBIN: 10.8 g/dL — AB (ref 12.0–15.0)
MCH: 29.2 pg (ref 26.0–34.0)
MCHC: 32.3 g/dL (ref 30.0–36.0)
MCV: 90.3 fL (ref 78.0–100.0)
Platelets: 213 10*3/uL (ref 150–400)
RBC: 3.7 MIL/uL — ABNORMAL LOW (ref 3.87–5.11)
RDW: 15.5 % (ref 11.5–15.5)
WBC: 4.7 10*3/uL (ref 4.0–10.5)

## 2014-03-26 LAB — PREALBUMIN: PREALBUMIN: 8.3 mg/dL — AB (ref 17.0–34.0)

## 2014-03-27 ENCOUNTER — Ambulatory Visit: Payer: Medicare Other | Admitting: Cardiology

## 2014-03-27 LAB — CULTURE, BLOOD (ROUTINE X 2)
CULTURE: NO GROWTH
Culture: NO GROWTH

## 2014-03-29 LAB — BASIC METABOLIC PANEL
BUN: 27 mg/dL — ABNORMAL HIGH (ref 6–23)
CO2: 25 meq/L (ref 19–32)
Calcium: 7.9 mg/dL — ABNORMAL LOW (ref 8.4–10.5)
Chloride: 103 mEq/L (ref 96–112)
Creatinine, Ser: 0.93 mg/dL (ref 0.50–1.10)
GFR calc Af Amer: 67 mL/min — ABNORMAL LOW (ref >90)
GFR, EST NON AFRICAN AMERICAN: 58 mL/min — AB (ref >90)
GLUCOSE: 116 mg/dL — AB (ref 70–99)
POTASSIUM: 4.5 meq/L (ref 3.7–5.3)
SODIUM: 136 meq/L — AB (ref 137–147)

## 2014-03-29 LAB — CBC
HCT: 32.6 % — ABNORMAL LOW (ref 36.0–46.0)
HEMOGLOBIN: 10.5 g/dL — AB (ref 12.0–15.0)
MCH: 29.4 pg (ref 26.0–34.0)
MCHC: 32.2 g/dL (ref 30.0–36.0)
MCV: 91.3 fL (ref 78.0–100.0)
Platelets: 213 10*3/uL (ref 150–400)
RBC: 3.57 MIL/uL — AB (ref 3.87–5.11)
RDW: 15.4 % (ref 11.5–15.5)
WBC: 4.6 10*3/uL (ref 4.0–10.5)

## 2014-04-02 LAB — BASIC METABOLIC PANEL
BUN: 24 mg/dL — ABNORMAL HIGH (ref 6–23)
CHLORIDE: 103 meq/L (ref 96–112)
CO2: 26 mEq/L (ref 19–32)
Calcium: 8.3 mg/dL — ABNORMAL LOW (ref 8.4–10.5)
Creatinine, Ser: 0.81 mg/dL (ref 0.50–1.10)
GFR calc non Af Amer: 68 mL/min — ABNORMAL LOW (ref >90)
GFR, EST AFRICAN AMERICAN: 79 mL/min — AB (ref >90)
Glucose, Bld: 108 mg/dL — ABNORMAL HIGH (ref 70–99)
POTASSIUM: 4.6 meq/L (ref 3.7–5.3)
Sodium: 139 mEq/L (ref 137–147)

## 2014-04-02 LAB — CBC WITH DIFFERENTIAL/PLATELET
BASOS PCT: 1 % (ref 0–1)
Basophils Absolute: 0 10*3/uL (ref 0.0–0.1)
Eosinophils Absolute: 0.3 10*3/uL (ref 0.0–0.7)
Eosinophils Relative: 7 % — ABNORMAL HIGH (ref 0–5)
HCT: 36 % (ref 36.0–46.0)
HEMOGLOBIN: 11.6 g/dL — AB (ref 12.0–15.0)
Lymphocytes Relative: 16 % (ref 12–46)
Lymphs Abs: 0.7 10*3/uL (ref 0.7–4.0)
MCH: 29.1 pg (ref 26.0–34.0)
MCHC: 32.2 g/dL (ref 30.0–36.0)
MCV: 90.5 fL (ref 78.0–100.0)
Monocytes Absolute: 0.5 10*3/uL (ref 0.1–1.0)
Monocytes Relative: 13 % — ABNORMAL HIGH (ref 3–12)
NEUTROS PCT: 63 % (ref 43–77)
Neutro Abs: 2.6 10*3/uL (ref 1.7–7.7)
Platelets: 241 10*3/uL (ref 150–400)
RBC: 3.98 MIL/uL (ref 3.87–5.11)
RDW: 15.3 % (ref 11.5–15.5)
WBC: 4.1 10*3/uL (ref 4.0–10.5)

## 2014-04-02 LAB — PREALBUMIN: Prealbumin: 7.8 mg/dL — ABNORMAL LOW (ref 17.0–34.0)

## 2014-04-05 LAB — CBC WITH DIFFERENTIAL/PLATELET
BASOS PCT: 0 % (ref 0–1)
Basophils Absolute: 0 10*3/uL (ref 0.0–0.1)
Eosinophils Absolute: 0.3 10*3/uL (ref 0.0–0.7)
Eosinophils Relative: 7 % — ABNORMAL HIGH (ref 0–5)
HCT: 36.9 % (ref 36.0–46.0)
Hemoglobin: 12 g/dL (ref 12.0–15.0)
LYMPHS PCT: 16 % (ref 12–46)
Lymphs Abs: 0.7 10*3/uL (ref 0.7–4.0)
MCH: 29.6 pg (ref 26.0–34.0)
MCHC: 32.5 g/dL (ref 30.0–36.0)
MCV: 90.9 fL (ref 78.0–100.0)
Monocytes Absolute: 0.6 10*3/uL (ref 0.1–1.0)
Monocytes Relative: 13 % — ABNORMAL HIGH (ref 3–12)
NEUTROS ABS: 2.9 10*3/uL (ref 1.7–7.7)
NEUTROS PCT: 64 % (ref 43–77)
Platelets: 229 10*3/uL (ref 150–400)
RBC: 4.06 MIL/uL (ref 3.87–5.11)
RDW: 15.6 % — ABNORMAL HIGH (ref 11.5–15.5)
WBC: 4.6 10*3/uL (ref 4.0–10.5)

## 2014-04-05 LAB — BASIC METABOLIC PANEL
BUN: 25 mg/dL — AB (ref 6–23)
CHLORIDE: 106 meq/L (ref 96–112)
CO2: 27 meq/L (ref 19–32)
Calcium: 8.5 mg/dL (ref 8.4–10.5)
Creatinine, Ser: 0.87 mg/dL (ref 0.50–1.10)
GFR calc Af Amer: 73 mL/min — ABNORMAL LOW (ref >90)
GFR calc non Af Amer: 63 mL/min — ABNORMAL LOW (ref >90)
Glucose, Bld: 99 mg/dL (ref 70–99)
POTASSIUM: 4.7 meq/L (ref 3.7–5.3)
Sodium: 140 mEq/L (ref 137–147)

## 2014-04-08 ENCOUNTER — Other Ambulatory Visit (HOSPITAL_COMMUNITY): Payer: Medicare Other

## 2014-04-08 DIAGNOSIS — R609 Edema, unspecified: Secondary | ICD-10-CM

## 2014-04-08 NOTE — Progress Notes (Signed)
*  PRELIMINARY RESULTS* Vascular Ultrasound Right upper extremity venous duplex has been completed.  Preliminary findings: No evidence of DVT or superficial thrombosis.     Landry Mellow, RDMS, RVT  04/08/2014, 12:06 PM

## 2014-04-09 LAB — CBC
HEMATOCRIT: 35 % — AB (ref 36.0–46.0)
HEMOGLOBIN: 11.4 g/dL — AB (ref 12.0–15.0)
MCH: 29.8 pg (ref 26.0–34.0)
MCHC: 32.6 g/dL (ref 30.0–36.0)
MCV: 91.6 fL (ref 78.0–100.0)
Platelets: 228 10*3/uL (ref 150–400)
RBC: 3.82 MIL/uL — ABNORMAL LOW (ref 3.87–5.11)
RDW: 15.6 % — ABNORMAL HIGH (ref 11.5–15.5)
WBC: 4.3 10*3/uL (ref 4.0–10.5)

## 2014-04-09 LAB — COMPREHENSIVE METABOLIC PANEL
ALK PHOS: 169 U/L — AB (ref 39–117)
ALT: 14 U/L (ref 0–35)
AST: 41 U/L — ABNORMAL HIGH (ref 0–37)
Albumin: 1.7 g/dL — ABNORMAL LOW (ref 3.5–5.2)
BILIRUBIN TOTAL: 0.4 mg/dL (ref 0.3–1.2)
BUN: 27 mg/dL — AB (ref 6–23)
CO2: 27 mEq/L (ref 19–32)
Calcium: 8.5 mg/dL (ref 8.4–10.5)
Chloride: 105 mEq/L (ref 96–112)
Creatinine, Ser: 0.95 mg/dL (ref 0.50–1.10)
GFR calc non Af Amer: 56 mL/min — ABNORMAL LOW (ref >90)
GFR, EST AFRICAN AMERICAN: 65 mL/min — AB (ref >90)
GLUCOSE: 105 mg/dL — AB (ref 70–99)
POTASSIUM: 4.5 meq/L (ref 3.7–5.3)
Sodium: 141 mEq/L (ref 137–147)
Total Protein: 5.4 g/dL — ABNORMAL LOW (ref 6.0–8.3)

## 2014-04-09 LAB — DIGOXIN LEVEL: Digoxin Level: 0.8 ng/mL (ref 0.8–2.0)

## 2014-04-09 LAB — PREALBUMIN: Prealbumin: 9.6 mg/dL — ABNORMAL LOW (ref 17.0–34.0)

## 2014-09-18 ENCOUNTER — Telehealth: Payer: Self-pay | Admitting: Cardiology

## 2014-09-18 NOTE — Telephone Encounter (Signed)
Pt husband st she is "practically dead" so she cannot call back.  She is currently in a nursing home with bed sores.   Mr. Whitworth st that Nira Conn called and left a message to call the office.  Routing to [place] for review.

## 2014-09-18 NOTE — Telephone Encounter (Signed)
Follow up  ° ° ° °Returning call back to nurse  °

## 2014-09-18 NOTE — Telephone Encounter (Signed)
Sorry. I didn't call them. Maybe a different office tried to call. I'm not sure who she is or what it's about. Thanks!

## 2014-09-27 ENCOUNTER — Telehealth: Payer: Self-pay | Admitting: Cardiology

## 2014-09-27 NOTE — Telephone Encounter (Signed)
New message  ° ° °Returning call back to nurse.  °

## 2014-09-27 NOTE — Telephone Encounter (Signed)
Spoke with Shanon Brow, patient's husband. Clarified that the call was made for him, not for his wife.

## 2014-10-18 ENCOUNTER — Encounter (HOSPITAL_COMMUNITY): Payer: Self-pay | Admitting: Cardiology

## 2015-08-02 ENCOUNTER — Inpatient Hospital Stay (HOSPITAL_COMMUNITY)
Admission: EM | Admit: 2015-08-02 | Discharge: 2015-08-08 | DRG: 689 | Disposition: A | Discharge status: Home / Self Care | Payer: Medicare Other | Attending: Internal Medicine | Admitting: Internal Medicine

## 2015-08-02 ENCOUNTER — Ambulatory Visit (HOSPITAL_COMMUNITY): Admit: 2015-08-02 | Payer: Self-pay | Admitting: Cardiovascular Disease

## 2015-08-02 ENCOUNTER — Encounter (HOSPITAL_COMMUNITY): Payer: Self-pay

## 2015-08-02 ENCOUNTER — Emergency Department (HOSPITAL_COMMUNITY): Payer: Medicare Other

## 2015-08-02 DIAGNOSIS — E860 Dehydration: Secondary | ICD-10-CM | POA: Diagnosis present

## 2015-08-02 DIAGNOSIS — R410 Disorientation, unspecified: Secondary | ICD-10-CM | POA: Diagnosis not present

## 2015-08-02 DIAGNOSIS — I110 Hypertensive heart disease with heart failure: Secondary | ICD-10-CM | POA: Diagnosis present

## 2015-08-02 DIAGNOSIS — E876 Hypokalemia: Secondary | ICD-10-CM | POA: Diagnosis present

## 2015-08-02 DIAGNOSIS — Z66 Do not resuscitate: Secondary | ICD-10-CM | POA: Diagnosis present

## 2015-08-02 DIAGNOSIS — I4891 Unspecified atrial fibrillation: Secondary | ICD-10-CM | POA: Diagnosis present

## 2015-08-02 DIAGNOSIS — E1100 Type 2 diabetes mellitus with hyperosmolarity without nonketotic hyperglycemic-hyperosmolar coma (NKHHC): Secondary | ICD-10-CM | POA: Diagnosis present

## 2015-08-02 DIAGNOSIS — Z87891 Personal history of nicotine dependence: Secondary | ICD-10-CM

## 2015-08-02 DIAGNOSIS — A047 Enterocolitis due to Clostridium difficile: Secondary | ICD-10-CM | POA: Diagnosis not present

## 2015-08-02 DIAGNOSIS — G4733 Obstructive sleep apnea (adult) (pediatric): Secondary | ICD-10-CM | POA: Diagnosis present

## 2015-08-02 DIAGNOSIS — A0472 Enterocolitis due to Clostridium difficile, not specified as recurrent: Secondary | ICD-10-CM | POA: Diagnosis present

## 2015-08-02 DIAGNOSIS — R9431 Abnormal electrocardiogram [ECG] [EKG]: Secondary | ICD-10-CM | POA: Insufficient documentation

## 2015-08-02 DIAGNOSIS — N179 Acute kidney failure, unspecified: Secondary | ICD-10-CM | POA: Diagnosis present

## 2015-08-02 DIAGNOSIS — E780 Pure hypercholesterolemia: Secondary | ICD-10-CM | POA: Diagnosis present

## 2015-08-02 DIAGNOSIS — E870 Hyperosmolality and hypernatremia: Secondary | ICD-10-CM | POA: Diagnosis not present

## 2015-08-02 DIAGNOSIS — I5022 Chronic systolic (congestive) heart failure: Secondary | ICD-10-CM | POA: Diagnosis present

## 2015-08-02 DIAGNOSIS — G9340 Encephalopathy, unspecified: Secondary | ICD-10-CM | POA: Diagnosis present

## 2015-08-02 DIAGNOSIS — R634 Abnormal weight loss: Secondary | ICD-10-CM

## 2015-08-02 DIAGNOSIS — R7989 Other specified abnormal findings of blood chemistry: Secondary | ICD-10-CM | POA: Diagnosis present

## 2015-08-02 DIAGNOSIS — I482 Chronic atrial fibrillation: Secondary | ICD-10-CM | POA: Diagnosis not present

## 2015-08-02 DIAGNOSIS — R739 Hyperglycemia, unspecified: Secondary | ICD-10-CM | POA: Insufficient documentation

## 2015-08-02 DIAGNOSIS — R778 Other specified abnormalities of plasma proteins: Secondary | ICD-10-CM | POA: Diagnosis present

## 2015-08-02 DIAGNOSIS — I480 Paroxysmal atrial fibrillation: Secondary | ICD-10-CM | POA: Diagnosis not present

## 2015-08-02 DIAGNOSIS — N390 Urinary tract infection, site not specified: Secondary | ICD-10-CM | POA: Diagnosis present

## 2015-08-02 DIAGNOSIS — B9789 Other viral agents as the cause of diseases classified elsewhere: Secondary | ICD-10-CM | POA: Diagnosis present

## 2015-08-02 LAB — I-STAT CHEM 8, ED
BUN: 83 mg/dL — ABNORMAL HIGH (ref 6–20)
Calcium, Ion: 1.12 mmol/L — ABNORMAL LOW (ref 1.13–1.30)
Chloride: 101 mmol/L (ref 101–111)
Creatinine, Ser: 2 mg/dL — ABNORMAL HIGH (ref 0.44–1.00)
Glucose, Bld: >700 mg/dL (ref 65–99)
HEMATOCRIT: 51 % — AB (ref 36.0–46.0)
Hemoglobin: 17.3 g/dL — ABNORMAL HIGH (ref 12.0–15.0)
POTASSIUM: 4.1 mmol/L (ref 3.5–5.1)
Sodium: 141 mmol/L (ref 135–145)
TCO2: 26 mmol/L (ref 0–100)

## 2015-08-02 LAB — URINALYSIS, ROUTINE W REFLEX MICROSCOPIC
Bilirubin Urine: NEGATIVE
Glucose, UA: >1000 mg/dL — AB
Ketones, ur: NEGATIVE mg/dL
Nitrite: POSITIVE — AB
Protein, ur: NEGATIVE mg/dL
SPECIFIC GRAVITY, URINE: 1.028 (ref 1.005–1.030)
UROBILINOGEN UA: 0.2 mg/dL (ref 0.0–1.0)
pH: 5 (ref 5.0–8.0)

## 2015-08-02 LAB — BASIC METABOLIC PANEL
Anion gap: 16 — ABNORMAL HIGH (ref 5–15)
BUN: 92 mg/dL — AB (ref 6–20)
CALCIUM: 9.9 mg/dL (ref 8.9–10.3)
CO2: 28 mmol/L (ref 22–32)
CREATININE: 2.35 mg/dL — AB (ref 0.44–1.00)
Chloride: 95 mmol/L — ABNORMAL LOW (ref 101–111)
GFR calc Af Amer: 22 mL/min — ABNORMAL LOW (ref >60)
GFR, EST NON AFRICAN AMERICAN: 19 mL/min — AB (ref >60)
GLUCOSE: 970 mg/dL — AB (ref 65–99)
Potassium: 4.1 mmol/L (ref 3.5–5.1)
SODIUM: 139 mmol/L (ref 135–145)

## 2015-08-02 LAB — CBC WITH DIFFERENTIAL/PLATELET
BASOS ABS: 0 10*3/uL (ref 0.0–0.1)
BASOS PCT: 1 %
EOS ABS: 0.1 10*3/uL (ref 0.0–0.7)
EOS PCT: 1 %
HCT: 48.2 % — ABNORMAL HIGH (ref 36.0–46.0)
Hemoglobin: 16.3 g/dL — ABNORMAL HIGH (ref 12.0–15.0)
Lymphocytes Relative: 16 %
Lymphs Abs: 1.4 10*3/uL (ref 0.7–4.0)
MCH: 31.7 pg (ref 26.0–34.0)
MCHC: 33.8 g/dL (ref 30.0–36.0)
MCV: 93.8 fL (ref 78.0–100.0)
MONO ABS: 0.8 10*3/uL (ref 0.1–1.0)
Monocytes Relative: 9 %
Neutro Abs: 6.4 10*3/uL (ref 1.7–7.7)
Neutrophils Relative %: 73 %
PLATELETS: 189 10*3/uL (ref 150–400)
RBC: 5.14 MIL/uL — ABNORMAL HIGH (ref 3.87–5.11)
RDW: 13.3 % (ref 11.5–15.5)
WBC: 8.7 10*3/uL (ref 4.0–10.5)

## 2015-08-02 LAB — I-STAT CG4 LACTIC ACID, ED: Lactic Acid, Venous: 2.63 mmol/L (ref 0.5–2.0)

## 2015-08-02 LAB — URINE MICROSCOPIC-ADD ON

## 2015-08-02 LAB — GLUCOSE, CAPILLARY
Glucose-Capillary: 554 mg/dL (ref 65–99)
Glucose-Capillary: 524 mg/dL — ABNORMAL HIGH (ref 65–99)
Glucose-Capillary: 377 mg/dL — ABNORMAL HIGH (ref 65–99)

## 2015-08-02 LAB — MRSA PCR SCREENING: MRSA by PCR: POSITIVE — AB

## 2015-08-02 LAB — TROPONIN I: TROPONIN I: 0.04 ng/mL — AB (ref <0.031)

## 2015-08-02 LAB — I-STAT TROPONIN, ED: TROPONIN I, POC: 0 ng/mL (ref 0.00–0.08)

## 2015-08-02 LAB — CBG MONITORING, ED

## 2015-08-02 SURGERY — LEFT HEART CATH AND CORONARY ANGIOGRAPHY
Anesthesia: LOCAL

## 2015-08-02 MED ORDER — SODIUM CHLORIDE 0.9 % IV SOLN: INTRAVENOUS | Status: DC

## 2015-08-02 MED ORDER — INSULIN REGULAR HUMAN 100 UNIT/ML IJ SOLN
INTRAMUSCULAR | Status: AC
  Administered 2015-08-02: 14.8 [IU]/h via INTRAVENOUS
  Filled 2015-08-02: qty 2.5

## 2015-08-02 MED ORDER — SODIUM CHLORIDE 0.9 % IV BOLUS (SEPSIS)
500.0000 mL | Freq: Once | INTRAVENOUS | Status: AC
  Administered 2015-08-02: 500 mL via INTRAVENOUS

## 2015-08-02 MED ORDER — SODIUM CHLORIDE 0.9 % IV SOLN
INTRAVENOUS | Status: DC
  Administered 2015-08-02: 5.4 [IU]/h via INTRAVENOUS
  Filled 2015-08-02: qty 2.5

## 2015-08-02 MED ORDER — ENOXAPARIN SODIUM 40 MG/0.4ML ~~LOC~~ SOLN
40.0000 mg | SUBCUTANEOUS | Status: DC
  Administered 2015-08-02: 40 mg via SUBCUTANEOUS
  Filled 2015-08-02: qty 0.4

## 2015-08-02 MED ORDER — POTASSIUM CHLORIDE 10 MEQ/100ML IV SOLN
10.0000 meq | INTRAVENOUS | Status: AC
  Administered 2015-08-02 – 2015-08-03 (×4): 10 meq via INTRAVENOUS
  Filled 2015-08-02 (×4): qty 100

## 2015-08-02 MED ORDER — DEXTROSE 5 % IV SOLN
1.0000 g | INTRAVENOUS | Status: DC
  Administered 2015-08-03 – 2015-08-05 (×3): 1 g via INTRAVENOUS
  Filled 2015-08-02 (×4): qty 10

## 2015-08-02 MED ORDER — POTASSIUM CHLORIDE IN NACL 20-0.45 MEQ/L-% IV SOLN
INTRAVENOUS | Status: DC
  Administered 2015-08-03: via INTRAVENOUS
  Filled 2015-08-02 (×4): qty 1000

## 2015-08-02 MED ORDER — DEXTROSE-NACL 5-0.45 % IV SOLN
INTRAVENOUS | Status: AC
  Administered 2015-08-03: 02:00:00 via INTRAVENOUS

## 2015-08-02 MED ORDER — SODIUM CHLORIDE 0.9 % IV BOLUS (SEPSIS)
1000.0000 mL | Freq: Once | INTRAVENOUS | Status: AC
  Administered 2015-08-02: 1000 mL via INTRAVENOUS

## 2015-08-02 MED ORDER — DEXTROSE-NACL 5-0.45 % IV SOLN: INTRAVENOUS | Status: DC

## 2015-08-02 MED ORDER — CEFTRIAXONE SODIUM 1 G IJ SOLR
1.0000 g | Freq: Once | INTRAMUSCULAR | Status: AC
  Administered 2015-08-02: 1 g via INTRAVENOUS
  Filled 2015-08-02: qty 10

## 2015-08-02 NOTE — ED Notes (Signed)
Pt brought in by EMS from home for AMS x2 days.  Pt husband reports that pt has been more lethargic and weak than usual.  He also reports increased confusion.  Pt had UA completed by home health nurse last week which showed hematuria.

## 2015-08-02 NOTE — ED Notes (Signed)
Admitting into see pt

## 2015-08-02 NOTE — H&P (Signed)
[facility] History and Physical  Tara Huff SAY:[id number] DOB: 1934-01-02 DOA: 08/02/2015  Referring physician:  PCP: Reginia Naas, MD   Chief Complaint: Change in mental Status.   HPI: Tara Huff is a 81 y.o. female with below past medical history Atrial fibrillation, Hypertension,  MI (myocardial infarction), Anxiety,Depression,Hypercholesterolemia, Shoulder pain, Type 2 diabetes mellitus, CHF (congestive heart failure), OSA (obstructive sleep apnea)who was brought to the emergency department after she was found to be weak, malaised and confused when evaluated by home health nursing earlier in the day. She lives at home with her husband, but apparently her husband has a history of dementia. No further history is available at this time   Review of Systems:  Unable to review due to the patient's mental status.  Past Medical History  Diagnosis Date  . Hypertension   . Atrial fibrillation   . MI (myocardial infarction)   . Anxiety   . Hypercholesterolemia   . Shoulder pain   . Sleep apnea     intolerant to CPAP  . Type 2 diabetes mellitus   . OSA (obstructive sleep apnea)   . Hypertensive heart disease   . Morbid obesity   . Pyogenic granuloma   . Cancer     cervical  . Depression   . Pneumonia   . CHF (congestive heart failure)   . Cardiac arrest 11/14/2013  . Complete heart block 11/14/2013  . OSTEOMYELITIS 04/28/2010    L great Toe      . Carcinoma of endometrium 11/20/2013   Past Surgical History  Procedure Laterality Date  . Knee surgery      at age 35  . Cataract surgery      both eyes  . Abdominal hysterectomy    . Toe amputation    . Temporary pacemaker insertion N/A 11/14/2013    Procedure: TEMPORARY PACEMAKER INSERTION;  Surgeon: Peter M Martinique, MD;  Location: [facility] CATH LAB;  Service: Cardiovascular;  Laterality: N/A;   Social History:  reports that she quit smoking about 22 years ago. Her smoking use included Cigarettes. She has a  20 pack-year smoking history. She has never used smokeless tobacco. She reports that she does not drink alcohol or use illicit drugs.  No Known Allergies  Family History  Problem Relation Age of Onset  . Emphysema Father   . Cancer Mother     intestinal  . Cancer Father     Prior to Admission medications   Medication Sig Start Date End Date Taking? Authorizing Provider  acetaminophen (TYLENOL) 325 MG tablet Take 2 tablets (650 mg total) by mouth every 6 (six) hours as needed for mild pain (or Fever >/= 101). 03/20/14   Delfina Redwood, MD  ALPRAZolam Duanne Moron) 0.25 MG tablet Take 1-2 tablets (0.25-0.5 mg total) by mouth 2 (two) times daily as needed for anxiety. Take one tablet by mouth every 12 hours as needed for anxiety 02/01/14   Kelvin Cellar, MD  aspirin EC 81 MG EC tablet Take 1 tablet (81 mg total) by mouth daily. 05/01/13   Ripudeep Krystal Eaton, MD  carvedilol (COREG) 3.125 MG tablet Take 1 tablet (3.125 mg total) by mouth 2 (two) times daily with a meal. 03/20/14   Delfina Redwood, MD  Cholecalciferol (VITAMIN D) 1000 UNITS capsule Take 1,000 Units by mouth daily.      Historical Provider, MD  DAPTOmycin 495 mg in sodium chloride 0.9 % 100 mL Inject 495 mg into the vein daily. 03/20/14  Delfina Redwood, MD  digoxin (LANOXIN) 0.25 MG/ML injection Inject 0.5 mLs (0.125 mg total) into the vein daily. 03/20/14   Delfina Redwood, MD  feeding supplement, GLUCERNA SHAKE, (GLUCERNA SHAKE) LIQD Take 237 mLs by mouth 2 (two) times daily between meals. 03/20/14   Delfina Redwood, MD  furosemide (LASIX) 20 MG tablet Take 2 tablets (40 mg total) by mouth daily. 01/03/14   Gerlene Fee, NP  heparin 5000 UNIT/ML injection Inject 1 mL (5,000 Units total) into the skin every 8 (eight) hours. 03/20/14   Delfina Redwood, MD  insulin aspart (NOVOLOG) 100 UNIT/ML injection Inject 0-9 Units into the skin 3 (three) times daily with meals. 02/01/14   Kelvin Cellar, MD  insulin aspart (NOVOLOG) 100  UNIT/ML injection Inject 0-5 Units into the skin at bedtime. 03/20/14   Delfina Redwood, MD  insulin aspart (NOVOLOG) 100 UNIT/ML injection Inject 0-9 Units into the skin 3 (three) times daily with meals. 03/20/14   Delfina Redwood, MD  morphine 2 MG/ML injection Inject 0.5-1 mLs (1-2 mg total) into the vein every 2 (two) hours as needed (and breakthough pain not relieved by oxy IR). 03/20/14   Delfina Redwood, MD  ondansetron (ZOFRAN) 4 MG tablet Take 1 tablet (4 mg total) by mouth every 6 (six) hours as needed for nausea. 03/20/14   Delfina Redwood, MD  oxyCODONE (OXY IR/ROXICODONE) 5 MG immediate release tablet Take 1-2 tablets (5-10 mg total) by mouth every 4 (four) hours as needed for moderate pain. 03/20/14   Delfina Redwood, MD  PARoxetine (PAXIL) 20 MG tablet Take 1 tablet (20 mg total) by mouth daily. 03/20/14   Delfina Redwood, MD  piperacillin-tazobactam (ZOSYN) 3.375 GM/50ML IVPB Inject 50 mLs (3.375 g total) into the vein every 8 (eight) hours. 03/20/14   Delfina Redwood, MD  sodium chloride 0.9 % injection Inject 3 mLs into the vein every 12 (twelve) hours. 03/20/14   Delfina Redwood, MD   Physical Exam: Filed Vitals:   08/02/15 1800 08/02/15 1845 08/02/15 1900 08/02/15 1915  BP: 127/66 117/75 122/54 134/51  Pulse: 105 73 76 72  Temp:      TempSrc:      Resp: 25 14 25 19   Height:      Weight:      SpO2: 90% 95% 94% 95%    Wt Readings from Last 3 Encounters:  08/02/15 72.576 kg (160 lb)  03/19/14 131.4 kg (289 lb 11 oz)  01/28/14 120.203 kg (265 lb)    General:  No acute distress, disoriented. Eyes: PERRL, normal lids, irises & conjunctiva ENT: grossly normal hearing, lips & tongue are very dry Neck: no LAD, masses or thyromegaly Cardiovascular: Irregularly irregular, no m/r/g. No LE edema. Telemetry: Atrial fibrillation. Respiratory: CTA bilaterally, no w/r/r. Normal respiratory effort. Abdomen: soft, ntnd Skin: no rash or induration seen on limited  exam Musculoskeletal: grossly normal tone BUE/BLE Psychiatric: Sleepy, confused, but arousable Neurologic: Confused, does not follow commands, unable to fully evaluate.          Labs on Admission:  Basic Metabolic Panel:  Recent Labs Lab 08/02/15 1702 08/02/15 1728  NA 139 141  K 4.1 4.1  CL 95* 101  CO2 28  --   GLUCOSE 970* >700*  BUN 92* 83*  CREATININE 2.35* 2.00*  CALCIUM 9.9  --    CBC:  Recent Labs Lab 08/02/15 1702 08/02/15 1728  WBC 8.7  --   NEUTROABS 6.4  --  HGB 16.3* 17.3*  HCT 48.2* 51.0*  MCV 93.8  --   PLT 189  --    Cardiac Enzymes:  Recent Labs Lab 08/02/15 1702  TROPONINI 0.04*    CBG:  Recent Labs Lab 08/02/15 1848  GLUCAP >600*    Radiological Exams on Admission: Dg Chest Port 1 View  08/02/2015   CLINICAL DATA:  Altered mental status for 2 days.  Weakness.  EXAM: PORTABLE CHEST 1 VIEW  COMPARISON:  03/23/2014  FINDINGS: Left base atelectasis or infiltrate. Right lung is clear. Heart is borderline in size. No effusions. No acute bony abnormality. Degenerative changes in the shoulders.  IMPRESSION: Left base atelectasis or infiltrate.   Electronically Signed   By: Rolm Baptise M.D.   On: 08/02/2015 17:36    EKG: Independently reviewed. Vent. rate 98 BPM PR interval * ms QRS duration 102 ms QT/QTc 384/490 ms P-R-T axes -1 93 255 Atrial fibrillation Right axis deviation Low voltage, precordial leads Abnormal R-wave progression, early transition Repol abnrm, severe global ischemia (LM/MVD) Baseline wander in lead(s) V6  Assessment/Plan Principal Problem:   Diabetes mellitus with hyperosmolarity   Change in mental status Admit to stepdown for closer monitoring. Continue regular insulin infusion with CBG monitoring hourly. Continue IV fluids. Monitor electrolytes periodically.  Active Problems:     UTI (lower urinary tract infection) Continue IV ceftriaxone every 24 hours.  Follow up urine culture and  sensitivity. Follow-up blood cultures.      Atrial fibrillation Ventricular rate is currently controlled. Monitor potassium levels, particularly while on insulin infusion. Check magnesium level. Telemetry monitoring.      Acute hypernatremia Corrected sodium was 148 mmol/liter. Once initial volume expansion is completed with NS bolus, will start 1/2 NS to provide some free water.  Follow-up sodium level periodically.       Prerenal azotemia/ AKI Continue IV hydration. Check urine random creatinine, sodium, potassium, protein. Check urine protein/creatinine ratio. Consider nephrology consult if no improvement.       Elevated troponin Continue serial troponin levels measurements. Check echocardiogram.  Hypophosphatemia Correcting with potassium phosphate.   Code Status: DO NOT RESUSCITATE DO NOT INTUBATE. DVT Prophylaxis: Lovenox SQ. Family Communication:  Disposition Plan: Admit to a stepdown for regular insulin infusion and rehydration.  Time spent: Over 70 minutes were spent during the process of this admission.  Reubin Milan [facility] Pager [phone].

## 2015-08-02 NOTE — ED Notes (Signed)
Echo at bedside

## 2015-08-02 NOTE — ED Provider Notes (Signed)
CSN: [id number]     Arrival date & time 08/02/15  1634 History   First MD Initiated Contact with Patient 08/02/15 1640     Chief Complaint  Patient presents with  . Altered Mental Status     (Consider location/radiation/quality/duration/timing/severity/associated sxs/prior Treatment) HPI   Tara Huff is a 81 y.o. female who presents for evaluation of weakness, and malaise, after evaluation by home health nursing, today. They apparently called EMS. The patient is unable to give any history.  Level V caveat-altered mental status   Past Medical History  Diagnosis Date  . Hypertension   . Atrial fibrillation   . MI (myocardial infarction)   . Anxiety   . Hypercholesterolemia   . Shoulder pain   . Sleep apnea     intolerant to CPAP  . Type 2 diabetes mellitus   . OSA (obstructive sleep apnea)   . Hypertensive heart disease   . Morbid obesity   . Pyogenic granuloma   . Cancer     cervical  . Depression   . Pneumonia   . CHF (congestive heart failure)   . Cardiac arrest 11/14/2013  . Complete heart block 11/14/2013  . OSTEOMYELITIS 04/28/2010    L great Toe      . Carcinoma of endometrium 11/20/2013   Past Surgical History  Procedure Laterality Date  . Knee surgery      at age 71  . Cataract surgery      both eyes  . Abdominal hysterectomy    . Toe amputation    . Temporary pacemaker insertion N/A 11/14/2013    Procedure: TEMPORARY PACEMAKER INSERTION;  Surgeon: Peter M Martinique, MD;  Location: [facility] CATH LAB;  Service: Cardiovascular;  Laterality: N/A;   Family History  Problem Relation Age of Onset  . Emphysema Father   . Cancer Mother     intestinal  . Cancer Father    Social History  Substance Use Topics  . Smoking status: Former Smoker -- 1.00 packs/day for 20 years    Types: Cigarettes    Quit date: 11/09/1992  . Smokeless tobacco: Never Used  . Alcohol Use: No   OB History    No data available     Review of Systems  Unable to perform  ROS     Allergies  Review of patient's allergies indicates no known allergies.  Home Medications   Prior to Admission medications   Medication Sig Start Date End Date Taking? Authorizing Provider  acetaminophen (TYLENOL) 325 MG tablet Take 2 tablets (650 mg total) by mouth every 6 (six) hours as needed for mild pain (or Fever >/= 101). 03/20/14   Delfina Redwood, MD  ALPRAZolam Duanne Moron) 0.25 MG tablet Take 1-2 tablets (0.25-0.5 mg total) by mouth 2 (two) times daily as needed for anxiety. Take one tablet by mouth every 12 hours as needed for anxiety 02/01/14   Kelvin Cellar, MD  aspirin EC 81 MG EC tablet Take 1 tablet (81 mg total) by mouth daily. 05/01/13   Ripudeep Krystal Eaton, MD  carvedilol (COREG) 3.125 MG tablet Take 1 tablet (3.125 mg total) by mouth 2 (two) times daily with a meal. 03/20/14   Delfina Redwood, MD  Cholecalciferol (VITAMIN D) 1000 UNITS capsule Take 1,000 Units by mouth daily.      Historical Provider, MD  DAPTOmycin 495 mg in sodium chloride 0.9 % 100 mL Inject 495 mg into the vein daily. 03/20/14   Delfina Redwood, MD  digoxin (LANOXIN) 0.25  MG/ML injection Inject 0.5 mLs (0.125 mg total) into the vein daily. 03/20/14   Delfina Redwood, MD  feeding supplement, GLUCERNA SHAKE, (GLUCERNA SHAKE) LIQD Take 237 mLs by mouth 2 (two) times daily between meals. 03/20/14   Delfina Redwood, MD  furosemide (LASIX) 20 MG tablet Take 2 tablets (40 mg total) by mouth daily. 01/03/14   Gerlene Fee, NP  heparin 5000 UNIT/ML injection Inject 1 mL (5,000 Units total) into the skin every 8 (eight) hours. 03/20/14   Delfina Redwood, MD  insulin aspart (NOVOLOG) 100 UNIT/ML injection Inject 0-9 Units into the skin 3 (three) times daily with meals. 02/01/14   Kelvin Cellar, MD  insulin aspart (NOVOLOG) 100 UNIT/ML injection Inject 0-5 Units into the skin at bedtime. 03/20/14   Delfina Redwood, MD  insulin aspart (NOVOLOG) 100 UNIT/ML injection Inject 0-9 Units into the skin 3  (three) times daily with meals. 03/20/14   Delfina Redwood, MD  morphine 2 MG/ML injection Inject 0.5-1 mLs (1-2 mg total) into the vein every 2 (two) hours as needed (and breakthough pain not relieved by oxy IR). 03/20/14   Delfina Redwood, MD  ondansetron (ZOFRAN) 4 MG tablet Take 1 tablet (4 mg total) by mouth every 6 (six) hours as needed for nausea. 03/20/14   Delfina Redwood, MD  oxyCODONE (OXY IR/ROXICODONE) 5 MG immediate release tablet Take 1-2 tablets (5-10 mg total) by mouth every 4 (four) hours as needed for moderate pain. 03/20/14   Delfina Redwood, MD  PARoxetine (PAXIL) 20 MG tablet Take 1 tablet (20 mg total) by mouth daily. 03/20/14   Delfina Redwood, MD  piperacillin-tazobactam (ZOSYN) 3.375 GM/50ML IVPB Inject 50 mLs (3.375 g total) into the vein every 8 (eight) hours. 03/20/14   Delfina Redwood, MD  sodium chloride 0.9 % injection Inject 3 mLs into the vein every 12 (twelve) hours. 03/20/14   Delfina Redwood, MD   BP 134/51 mmHg  Pulse 72  Temp(Src) 99.9 F (37.7 C) (Rectal)  Resp 19  Ht 5\' 4"  (1.626 m)  Wt 160 lb (72.576 kg)  BMI 27.45 kg/m2  SpO2 95% Physical Exam  Constitutional: She appears well-developed. She appears distressed (she is uncomfortable).  Elderly, frail.  HENT:  Head: Normocephalic and atraumatic.  Right Ear: External ear normal.  Left Ear: External ear normal.  Eyes: Conjunctivae and EOM are normal. Pupils are equal, round, and reactive to light.  Neck: Normal range of motion and phonation normal. Neck supple.  Cardiovascular:  Tachycardic  Pulmonary/Chest: Effort normal and breath sounds normal. No respiratory distress. She exhibits no bony tenderness.  Abdominal: Soft. There is no tenderness.  Musculoskeletal: Normal range of motion. She exhibits edema (2. Plus, lower extremities, brawny appearance.).  Neurological: She is alert. No cranial nerve deficit or sensory deficit. She exhibits normal muscle tone. Coordination normal.   Skin: Skin is warm, dry and intact. No rash noted.  Psychiatric:  She appears sad, depressed.  Nursing note and vitals reviewed.   ED Course  Procedures (including critical care time) Medications  dextrose 5 %-0.45 % sodium chloride infusion (not administered)  insulin regular (NOVOLIN R,HUMULIN R) 250 Units in sodium chloride 0.9 % 250 mL (1 Units/mL) infusion (5.4 Units/hr Intravenous New Bag/Given 08/02/15 1853)  cefTRIAXone (ROCEPHIN) 1 g in dextrose 5 % 50 mL IVPB (1 g Intravenous New Bag/Given 08/02/15 1857)  sodium chloride 0.9 % bolus 1,000 mL (not administered)  0.45 % NaCl with KCl 20  mEq / L infusion (not administered)  sodium chloride 0.9 % bolus 500 mL (0 mLs Intravenous Stopped 08/02/15 1734)    Patient Vitals for the past 24 hrs:  BP Temp Temp src Pulse Resp SpO2 Height Weight  08/02/15 1915 (!) 134/51 mmHg - - 72 19 95 % - -  08/02/15 1900 (!) 122/54 mmHg - - 76 25 94 % - -  08/02/15 1845 117/75 mmHg - - 73 14 95 % - -  08/02/15 1800 127/66 mmHg - - 105 25 90 % - -  08/02/15 1743 - 99.9 F (37.7 C) Rectal - - - - -  08/02/15 1730 (!) 116/51 mmHg - - 110 23 95 % - -  08/02/15 1715 - - - - - 96 % - -  08/02/15 1700 139/63 mmHg - - - - - - -  08/02/15 1651 137/90 mmHg 98.7 F (37.1 C) Oral 92 (!) 27 98 % 5\' 4"  (1.626 m) 160 lb (72.576 kg)       6:20 PM-Consult complete with Dr Olevia Bowens. Patient case explained and discussed. He agrees to admit patient for further evaluation and treatment. Call ended at [place] Performed by: Richarda Blade Total critical care time: 45 minutes Critical care time was exclusive of separately billable procedures and treating other patients. Critical care was necessary to treat or prevent imminent or life-threatening deterioration. Critical care was time spent personally by me on the following activities: development of treatment plan with patient and/or surrogate as well as nursing, discussions with consultants, evaluation of  patient's response to treatment, examination of patient, obtaining history from patient or surrogate, ordering and performing treatments and interventions, ordering and review of laboratory studies, ordering and review of radiographic studies, pulse oximetry and re-evaluation of patient's condition.    Labs Review Labs Reviewed  BASIC METABOLIC PANEL - Abnormal; Notable for the following:    Chloride 95 (*)    Glucose, Bld 970 (*)    BUN 92 (*)    Creatinine, Ser 2.35 (*)    GFR calc non Af Amer 19 (*)    GFR calc Af Amer 22 (*)    Anion gap 16 (*)    All other components within normal limits  TROPONIN I - Abnormal; Notable for the following:    Troponin I 0.04 (*)    All other components within normal limits  CBC WITH DIFFERENTIAL/PLATELET - Abnormal; Notable for the following:    RBC 5.14 (*)    Hemoglobin 16.3 (*)    HCT 48.2 (*)    All other components within normal limits  URINALYSIS, ROUTINE W REFLEX MICROSCOPIC (NOT AT [facility]) - Abnormal; Notable for the following:    APPearance TURBID (*)    Glucose, UA >1000 (*)    Hgb urine dipstick LARGE (*)    Nitrite POSITIVE (*)    Leukocytes, UA LARGE (*)    All other components within normal limits  URINE MICROSCOPIC-ADD ON - Abnormal; Notable for the following:    Bacteria, UA MANY (*)    All other components within normal limits  I-STAT CG4 LACTIC ACID, ED - Abnormal; Notable for the following:    Lactic Acid, Venous 2.63 (*)    All other components within normal limits  I-STAT CHEM 8, ED - Abnormal; Notable for the following:    BUN 83 (*)    Creatinine, Ser 2.00 (*)    Glucose, Bld >700 (*)    Calcium, Ion 1.12 (*)  Hemoglobin 17.3 (*)    HCT 51.0 (*)    All other components within normal limits  CBG MONITORING, ED - Abnormal; Notable for the following:    Glucose-Capillary >600 (*)    All other components within normal limits  URINE CULTURE  Randolm Idol, ED    Imaging Review Dg Chest Port 1  View  08/02/2015   CLINICAL DATA:  Altered mental status for 2 days.  Weakness.  EXAM: PORTABLE CHEST 1 VIEW  COMPARISON:  03/23/2014  FINDINGS: Left base atelectasis or infiltrate. Right lung is clear. Heart is borderline in size. No effusions. No acute bony abnormality. Degenerative changes in the shoulders.  IMPRESSION: Left base atelectasis or infiltrate.   Electronically Signed   By: Rolm Baptise M.D.   On: 08/02/2015 17:36   I have personally reviewed and evaluated these images and lab results as part of my medical decision-making.   EKG Interpretation   Date/Time:  Friday August 02 2015 18:21:46 EDT Ventricular Rate:  98 PR Interval:    QRS Duration: 102 QT Interval:  384 QTC Calculation: 490 R Axis:   93 Text Interpretation:  Atrial fibrillation Right axis deviation Low  voltage, precordial leads Abnormal R-wave progression, early transition  Repol abnrm, severe global ischemia (LM/MVD) Baseline wander in lead(s) V6  Since last tracing of earlier today No significant change was found  Confirmed by Eulis Foster  MD, ELLIOTT [id number]) on 08/02/2015 7:27:56 PM        EKG Interpretation  Date/Time:  Friday August 02 2015 18:21:46 EDT Ventricular Rate:  98 PR Interval:    QRS Duration: 102 QT Interval:  384 QTC Calculation: 490 R Axis:   93 Text Interpretation:  Atrial fibrillation Right axis deviation Low voltage, precordial leads Abnormal R-wave progression, early transition Repol abnrm, severe global ischemia (LM/MVD) Baseline wander in lead(s) V6 Since last tracing of earlier today No significant change was found Confirmed by Eulis Foster  MD, Vira Agar ([id number]) on 08/02/2015 7:27:56 PM        MDM   Final diagnoses:  Hyperglycemia  AKI (acute kidney injury)  Atrial fibrillation, unspecified  Abnormal EKG  UTI (lower urinary tract infection)    Malaise and weakness, likely secondary to UTI. New-onset AKI, and hyperglycemia. Doubt DKA. Doubt severe sepsis. The blood pressure is  stable in the emergency department. She has seen and evaluated by cardiology. The heart rate has improved with IV fluids. The patient has been in atrial fibrillation, previously. She will require admission to stepdown for close observation and treatment.  Nursing Notes Reviewed/ Care Coordinated, and agree without changes. Applicable Imaging Reviewed.  Interpretation of Laboratory Data incorporated into ED treatment  Plan: Admit  Daleen Bo, MD 08/02/15 [phone]

## 2015-08-02 NOTE — Consult Note (Signed)
CARDIOLOGY CONSULT NOTE      Patient ID: Tara Huff MRN: [id number] DOB/AGE: 1934/10/01 81 y.o.  Admit date: 08/02/2015 Referring PhysicianElliott Eulis Foster, MD Primary Oliver Barre, MD Primary Cardiologist  Dr. Fransico Him Reason for Consultation abnormal ECG  HPI: 81 year old woman who has multiple medical problems. She has been chronically ill over the past few years with hospitalizations due to urosepsis and complications from decubitus ulcers. She lives at home and was followed by home health. She was more confused than normal. Home health recommended that 911 be called. Apparently the patient's husband called 911. Initial ECG revealed atrial fibrillation with rapid ventricular response. There were ST depressions. Code STEMI was called.  Upon arrival to the ER, the patient reports no pain in her chest. She is unable to give any meaningful history. When I ask her if she has pain, she replies "this is for my husband." She repeats this phrase "this is for my husband."  It was also noted that she had not been eating and drinking as well. There is a question of urinary tract infection as well.  Review of systems complete and found to be negative unless listed above   Past Medical History  Diagnosis Date  . Hypertension   . Atrial fibrillation   . MI (myocardial infarction)   . Anxiety   . Hypercholesterolemia   . Shoulder pain   . Sleep apnea     intolerant to CPAP  . Type 2 diabetes mellitus   . OSA (obstructive sleep apnea)   . Hypertensive heart disease   . Morbid obesity   . Pyogenic granuloma   . Cancer     cervical  . Depression   . Pneumonia   . CHF (congestive heart failure)   . Cardiac arrest 11/14/2013  . Complete heart block 11/14/2013  . OSTEOMYELITIS 04/28/2010    L great Toe      . Carcinoma of endometrium 11/20/2013    Family History  Problem Relation Age of Onset  . Emphysema Father   . Cancer Mother     intestinal  . Cancer Father       Social History   Social History  . Marital Status: Married    Spouse Name: N/A  . Number of Children: 1  . Years of Education: N/A   Occupational History  . retired    Social History Main Topics  . Smoking status: Former Smoker -- 1.00 packs/day for 20 years    Types: Cigarettes    Quit date: 11/09/1992  . Smokeless tobacco: Never Used  . Alcohol Use: No  . Drug Use: No  . Sexual Activity: No   Other Topics Concern  . Not on file   Social History Narrative   Lives with the husband, who helps in her daily care.    Past Surgical History  Procedure Laterality Date  . Knee surgery      at age 27  . Cataract surgery      both eyes  . Abdominal hysterectomy    . Toe amputation    . Temporary pacemaker insertion N/A 11/14/2013    Procedure: TEMPORARY PACEMAKER INSERTION;  Surgeon: Peter M Martinique, MD;  Location: [facility] CATH LAB;  Service: Cardiovascular;  Laterality: N/A;      (Not in a hospital admission)  Physical Exam: Vitals:   Filed Vitals:   08/02/15 1651  BP: 137/90  Pulse: 92  Temp: 98.7 F (37.1 C)  TempSrc: Oral  Resp: 27  Height: 5'  4" (1.626 m)  Weight: 160 lb (72.576 kg)  SpO2: 98%   I&O's:  No intake or output data in the 24 hours ending 08/02/15 1710 Physical exam: Patient appears frail [place]/AT EOMI No JVD, No carotid bruit Tachycardic S1S2  No wheezing Soft. NT, nondistended Bilateral lower extremity pitting edema. Legs discolored No focal motor or sensory deficits Flat affect,  disoriented  Labs:   Lab Results  Component Value Date   WBC 4.3 04/09/2014   HGB 11.4* 04/09/2014   HCT 35.0* 04/09/2014   MCV 91.6 04/09/2014   PLT 228 04/09/2014   No results for input(s): NA, K, CL, CO2, BUN, CREATININE, CALCIUM, PROT, BILITOT, ALKPHOS, ALT, AST, GLUCOSE in the last 168 hours.  Invalid input(s): LABALBU Lab Results  Component Value Date   CKTOTAL 65 03/21/2014   CKMB 2.7 03/20/2012   TROPONINI <0.30 03/16/2014    Lab Results   Component Value Date   CHOL 103 03/21/2014   CHOL 90 03/19/2014   CHOL 87 03/20/2012   Lab Results  Component Value Date   HDL 23* 03/21/2014   HDL 18* 03/19/2014   HDL 31* 03/20/2012   Lab Results  Component Value Date   LDLCALC 63 03/21/2014   LDLCALC 56 03/19/2014   LDLCALC 47 03/20/2012   Lab Results  Component Value Date   TRIG 85 03/21/2014   TRIG 78 03/19/2014   TRIG 47 03/20/2012   Lab Results  Component Value Date   CHOLHDL 4.5 03/21/2014   CHOLHDL 5.0 03/19/2014   CHOLHDL 2.8 03/20/2012   No results found for: LDLDIRECT    Radiology:cxr pending EKG: Atrial fibrillation with rapid ventricular response and diffuse ST segment depressions.  There is a marked difference compared to the May 2015 ECG.  ASSESSMENT AND PLAN:  Active Problems:   * No active hospital problems. *  Atrial fibrillation/abnormal ECG: She does appear to have global ischemic changes on her ECG. Hopefully with rate control, this will improve.  Would not take her to the Cath Lab at this time. Code STEMI canceled. Given that she is DNR/DNI and appears to have issues with dementia, would not pursue invasive workup at this time. I spoke to the emergency room physician. He will hydrate the patient and try to get her heart rate down with AV nodal blocking agents. She is not a good candidate for long-term anticoagulation.  Will obtain echocardiogram to assess LV function and for any potential wall motion abnormalities, different from prior.    Of note, chronic systolic heart failure is noted in her chart.  Prior echo in 2015 showed EF 50% with basal inferior hypokinesis and paradoxical septal motion.   Question urosepsis. Will defer to internal medicine.  It appears that she has lost a significant amount of weight in the last year. She certainly is no longer morbidly obese which is how she was described previously. Would strongly consider palliative care consult as well.  Signed:   Mina Marble, MD, [facility] 08/02/2015, 5:10 PM

## 2015-08-02 NOTE — Progress Notes (Signed)
  Echocardiogram 2D Echocardiogram has been performed.  Diamond Nickel 08/02/2015, 5:23 PM

## 2015-08-03 DIAGNOSIS — I4891 Unspecified atrial fibrillation: Secondary | ICD-10-CM | POA: Insufficient documentation

## 2015-08-03 DIAGNOSIS — N390 Urinary tract infection, site not specified: Principal | ICD-10-CM

## 2015-08-03 DIAGNOSIS — N179 Acute kidney failure, unspecified: Secondary | ICD-10-CM | POA: Insufficient documentation

## 2015-08-03 DIAGNOSIS — R7989 Other specified abnormal findings of blood chemistry: Secondary | ICD-10-CM

## 2015-08-03 DIAGNOSIS — R9431 Abnormal electrocardiogram [ECG] [EKG]: Secondary | ICD-10-CM | POA: Insufficient documentation

## 2015-08-03 DIAGNOSIS — I482 Chronic atrial fibrillation: Secondary | ICD-10-CM

## 2015-08-03 LAB — BASIC METABOLIC PANEL
ANION GAP: 10 (ref 5–15)
Anion gap: 7 (ref 5–15)
BUN: 71 mg/dL — ABNORMAL HIGH (ref 6–20)
BUN: 64 mg/dL — AB (ref 6–20)
CALCIUM: 9.2 mg/dL (ref 8.9–10.3)
CALCIUM: 8.8 mg/dL — AB (ref 8.9–10.3)
CHLORIDE: 113 mmol/L — AB (ref 101–111)
CO2: 26 mmol/L (ref 22–32)
CO2: 28 mmol/L (ref 22–32)
CREATININE: 1.5 mg/dL — AB (ref 0.44–1.00)
Chloride: 112 mmol/L — ABNORMAL HIGH (ref 101–111)
Creatinine, Ser: 1.75 mg/dL — ABNORMAL HIGH (ref 0.44–1.00)
GFR calc Af Amer: 37 mL/min — ABNORMAL LOW (ref >60)
GFR calc non Af Amer: 32 mL/min — ABNORMAL LOW (ref >60)
GFR, EST AFRICAN AMERICAN: 31 mL/min — AB (ref >60)
GFR, EST NON AFRICAN AMERICAN: 27 mL/min — AB (ref >60)
Glucose, Bld: 315 mg/dL — ABNORMAL HIGH (ref 65–99)
Glucose, Bld: 158 mg/dL — ABNORMAL HIGH (ref 65–99)
POTASSIUM: 3.3 mmol/L — AB (ref 3.5–5.1)
Potassium: 3.6 mmol/L (ref 3.5–5.1)
SODIUM: 148 mmol/L — AB (ref 135–145)
Sodium: 148 mmol/L — ABNORMAL HIGH (ref 135–145)

## 2015-08-03 LAB — GLUCOSE, CAPILLARY
GLUCOSE-CAPILLARY: 147 mg/dL — AB (ref 65–99)
GLUCOSE-CAPILLARY: 149 mg/dL — AB (ref 65–99)
GLUCOSE-CAPILLARY: 155 mg/dL — AB (ref 65–99)
GLUCOSE-CAPILLARY: 300 mg/dL — AB (ref 65–99)
Glucose-Capillary: 269 mg/dL — ABNORMAL HIGH (ref 65–99)
Glucose-Capillary: 207 mg/dL — ABNORMAL HIGH (ref 65–99)
Glucose-Capillary: 167 mg/dL — ABNORMAL HIGH (ref 65–99)
Glucose-Capillary: 139 mg/dL — ABNORMAL HIGH (ref 65–99)
Glucose-Capillary: 132 mg/dL — ABNORMAL HIGH (ref 65–99)
Glucose-Capillary: 147 mg/dL — ABNORMAL HIGH (ref 65–99)
Glucose-Capillary: 145 mg/dL — ABNORMAL HIGH (ref 65–99)
Glucose-Capillary: 213 mg/dL — ABNORMAL HIGH (ref 65–99)
Glucose-Capillary: 161 mg/dL — ABNORMAL HIGH (ref 65–99)

## 2015-08-03 LAB — PROTEIN / CREATININE RATIO, URINE
CREATININE, URINE: 33.78 mg/dL
Protein Creatinine Ratio: 0.53 mg/mg{Cre} — ABNORMAL HIGH (ref 0.00–0.15)
TOTAL PROTEIN, URINE: 18 mg/dL

## 2015-08-03 LAB — NA AND K (SODIUM & POTASSIUM), RAND UR
Potassium Urine: 19 mmol/L
Sodium, Ur: 44 mmol/L

## 2015-08-03 LAB — DIGOXIN LEVEL: Digoxin Level: 1 ng/mL (ref 0.8–2.0)

## 2015-08-03 LAB — PHOSPHORUS: Phosphorus: 1.2 mg/dL — ABNORMAL LOW (ref 2.5–4.6)

## 2015-08-03 LAB — TROPONIN I
TROPONIN I: 0.03 ng/mL (ref <0.031)
TROPONIN I: 0.03 ng/mL (ref <0.031)
Troponin I: <0.03 ng/mL (ref <0.031)

## 2015-08-03 LAB — PROTEIN, URINE, RANDOM: TOTAL PROTEIN, URINE: 18 mg/dL

## 2015-08-03 LAB — MAGNESIUM: Magnesium: 2.2 mg/dL (ref 1.7–2.4)

## 2015-08-03 MED ORDER — ENSURE ENLIVE PO LIQD
237.0000 mL | Freq: Two times a day (BID) | ORAL | Status: DC
  Administered 2015-08-05 – 2015-08-08 (×4): 237 mL via ORAL

## 2015-08-03 MED ORDER — SODIUM CHLORIDE 0.45 % IV SOLN
INTRAVENOUS | Status: DC
Start: 2015-08-03 — End: 2015-08-07
  Administered 2015-08-03: 100 mL/h via INTRAVENOUS
  Administered 2015-08-03 – 2015-08-04 (×4): via INTRAVENOUS
  Administered 2015-08-05: 500 mL via INTRAVENOUS
  Administered 2015-08-06 – 2015-08-07 (×2): via INTRAVENOUS

## 2015-08-03 MED ORDER — MUPIROCIN 2 % EX OINT
1.0000 "application " | TOPICAL_OINTMENT | Freq: Two times a day (BID) | CUTANEOUS | Status: AC
  Administered 2015-08-03 – 2015-08-08 (×10): 1 via NASAL
  Filled 2015-08-03 (×4): qty 22

## 2015-08-03 MED ORDER — POTASSIUM PHOSPHATES 15 MMOLE/5ML IV SOLN
30.0000 mmol | Freq: Once | INTRAVENOUS | Status: AC
  Administered 2015-08-03: 30 mmol via INTRAVENOUS
  Filled 2015-08-03: qty 10

## 2015-08-03 MED ORDER — ENOXAPARIN SODIUM 30 MG/0.3ML ~~LOC~~ SOLN
30.0000 mg | SUBCUTANEOUS | Status: DC
  Administered 2015-08-03: 30 mg via SUBCUTANEOUS
  Filled 2015-08-03: qty 0.3

## 2015-08-03 MED ORDER — CHLORHEXIDINE GLUCONATE CLOTH 2 % EX PADS
6.0000 | MEDICATED_PAD | Freq: Every day | CUTANEOUS | Status: DC
  Administered 2015-08-04 – 2015-08-07 (×3): 6 via TOPICAL

## 2015-08-03 MED ORDER — INSULIN ASPART 100 UNIT/ML ~~LOC~~ SOLN
0.0000 [IU] | SUBCUTANEOUS | Status: DC
  Administered 2015-08-03: 2 [IU] via SUBCUTANEOUS
  Administered 2015-08-03: 4 [IU] via SUBCUTANEOUS
  Administered 2015-08-03: 8 [IU] via SUBCUTANEOUS
  Administered 2015-08-04 (×3): 2 [IU] via SUBCUTANEOUS
  Administered 2015-08-04 (×2): 4 [IU] via SUBCUTANEOUS
  Administered 2015-08-05: 2 [IU] via SUBCUTANEOUS
  Administered 2015-08-05: 4 [IU] via SUBCUTANEOUS

## 2015-08-03 MED ORDER — INSULIN DETEMIR 100 UNIT/ML ~~LOC~~ SOLN
10.0000 [IU] | Freq: Every day | SUBCUTANEOUS | Status: DC
  Administered 2015-08-03 – 2015-08-08 (×6): 10 [IU] via SUBCUTANEOUS
  Filled 2015-08-03 (×6): qty 0.1

## 2015-08-03 MED ORDER — INFLUENZA VAC SPLIT QUAD 0.5 ML IM SUSY
0.5000 mL | PREFILLED_SYRINGE | INTRAMUSCULAR | Status: AC
  Administered 2015-08-04: 0.5 mL via INTRAMUSCULAR
  Filled 2015-08-03: qty 0.5

## 2015-08-03 NOTE — Progress Notes (Addendum)
Patient ID: Tara Huff, female   DOB: July 31, 1934, 81 y.o.   MRN: [id number]    Primary cardiologist:  Subjective:    No complaints  Objective:   Temp:  [97.6 F (36.4 C)-99.9 F (37.7 C)] 97.6 F (36.4 C) (09/24 0326) Pulse Rate:  [54-110] 79 (09/24 0326) Resp:  [14-27] 17 (09/24 0326) BP: (97-139)/(43-90) 97/43 mmHg (09/24 0326) SpO2:  [90 %-100 %] 95 % (09/24 0326) Weight:  [151 lb 14.4 oz (68.9 kg)-160 lb (72.576 kg)] 151 lb 14.4 oz (68.9 kg) (09/23 2051) Last BM Date:  (unable to tell not oriented)  [facility] Weights   08/02/15 1651 08/02/15 2051  Weight: 160 lb (72.576 kg) 151 lb 14.4 oz (68.9 kg)    Intake/Output Summary (Last 24 hours) at 08/03/15 0801 Last data filed at 08/03/15 0600  Gross per 24 hour  Intake 2362.04 ml  Output     50 ml  Net 2312.04 ml   :  Exam:  General: NAD  HEENT: sclera clear  Resp: CTAB  Cardiac: irreg, no m/r/g,  GI: abdomen soft, NT, nD  MSK: no LE edema  Neuro: no focal deficits   Lab Results:  Basic Metabolic Panel:  Recent Labs Lab 08/02/15 1702 08/02/15 1728 08/03/15 0105 08/03/15 0626  NA 139 141 148* 148*  K 4.1 4.1 3.3* 3.6  CL 95* 101 112* 113*  CO2 28  --  26 28  GLUCOSE 970* >700* 315* 158*  BUN 92* 83* 71* 64*  CREATININE 2.35* 2.00* 1.75* 1.50*  CALCIUM 9.9  --  9.2 8.8*  MG  --   --  2.2  --     Liver Function Tests: No results for input(s): AST, ALT, ALKPHOS, BILITOT, PROT, ALBUMIN in the last 168 hours.  CBC:  Recent Labs Lab 08/02/15 1702 08/02/15 1728  WBC 8.7  --   HGB 16.3* 17.3*  HCT 48.2* 51.0*  MCV 93.8  --   PLT 189  --     Cardiac Enzymes:  Recent Labs Lab 08/02/15 1702 08/02/15 2330 08/03/15 0626  TROPONINI 0.04* 0.03 0.03    BNP: No results for input(s): PROBNP in the last 8760 hours.  Coagulation: No results for input(s): INR in the last 168 hours.  ECG:   Medications:   Scheduled Medications: . cefTRIAXone (ROCEPHIN) IVPB 1 gram/50 mL D5W  1 g  Intravenous Q24H  . enoxaparin (LOVENOX) injection  30 mg Subcutaneous Q24H  . [START ON 08/04/2015] Influenza vac split quadrivalent PF  0.5 mL Intramuscular Tomorrow-1000  . insulin aspart  0-24 Units Subcutaneous 6 times per day  . insulin detemir  10 Units Subcutaneous Daily  . potassium phosphate IVPB (mmol)  30 mmol Intravenous Once     Infusions: . sodium chloride    . dextrose 5 % and 0.45% NaCl 125 mL/hr at 08/03/15 0222  . insulin (NOVOLIN-R) infusion 3.8 Units/hr (08/03/15 0727)     PRN Medications:       Assessment/Plan   1. Afib - not requiring AV nodal agents at this time, intermittent low heart rates, will not start at this time.  - from cards clinic notes she has previously refused anticoag, and when on previously on had "bleeding in her eye" - start ASA when taking po   2. Abnormal EKG - patient presented with generalized weakness, AMS.  - EKG concerning for global ischemia with ST elevation in aVR and diffuse ST depressions. Patient with no cardiac symptoms - code STEMI cancelled by Dr Scarlette Calico  after review of EKG at time of initial consult. Thought in general to be a poor cath candidate due to age, fragility, and advanced comorbidities. Recs at that time were for rate control of her afib and medical management. - troponins negative. Echo LVEF 55-60%, cannot exclude WMAs - no plans for ischemic testing at this time.  - check digoxin level  3. DM2 with hyperglycemia - management per primary team  4. AKI - likely prerenal, improving with IVF - multiple electrolyte abnormalities that are improving.      Carlyle Dolly, M.D.

## 2015-08-03 NOTE — Progress Notes (Signed)
Pt's husband Shanon Brow came into visit- completed admission since pt is confused & unreliable source  of information.

## 2015-08-03 NOTE — Progress Notes (Signed)
Utilization review completed.  

## 2015-08-03 NOTE — Progress Notes (Signed)
[facility] PROGRESS NOTE  Tara Huff KCL:[id number] DOB: 12/07/1933 DOA: 08/02/2015  PCP: Reginia Naas, MD  Brief HPI: 81 year old Caucasian female with a past medical history of atrial fibrillation, hypertension, previous MI, depression, was brought into the emergency department by EMS for weakness and confusion. She was found to have atrial fibrillation with mild RVR. She was noted to have a urinary tract infection. She was also noted to be profoundly hyperglycemic and was started on an insulin infusion. She was hospitalized for further management.  Past medical history:  Past Medical History  Diagnosis Date  . Hypertension   . Atrial fibrillation   . MI (myocardial infarction)   . Anxiety   . Hypercholesterolemia   . Shoulder pain   . Sleep apnea     intolerant to CPAP  . Type 2 diabetes mellitus   . OSA (obstructive sleep apnea)   . Hypertensive heart disease   . Morbid obesity   . Pyogenic granuloma   . Cancer     cervical  . Depression   . Pneumonia   . CHF (congestive heart failure)   . Cardiac arrest 11/14/2013  . Complete heart block 11/14/2013  . OSTEOMYELITIS 04/28/2010    L great Toe      . Carcinoma of endometrium 11/20/2013    Consultants: Cardiology  Procedures:  Echocardiogram Study Conclusions - Left ventricle: The cavity size was normal. Systolic function wasnormal. The estimated ejection fraction was in the range of 55%to 60%. Regional wall motion abnormalities cannot be excluded.The study was not technically sufficient to allow evaluation ofLV diastolic dysfunction due to atrial fibrillation. - Aortic valve: Trileaflet; mildly thickened, mildly calcifiedleaflets. There was no regurgitation. - Aortic root: The aortic root was normal in size. - Mitral valve: Structurally normal valve. There was noregurgitation. - Left atrium: The atrium was normal in size. - Right ventricle: Systolic function was normal. - Tricuspid valve: There  was trivial regurgitation. - Pulmonary arteries: Systolic pressure was within the normalrange. - Inferior vena cava: The vessel was normal in size. - Pericardium, extracardiac: There was no pericardial effusion.   Antibiotics: Ceftriaxone  Subjective: Patient is sleepy but arousable. Complains of pain in the right arm where her potassium is infusing. Unable to obtain much information from her.  Objective: Vital Signs  Filed Vitals:   08/02/15 2200 08/02/15 2300 08/03/15 0000 08/03/15 0326  BP: 114/54 121/79 109/49 97/43  Pulse: 83 54 66 79  Temp:   97.6 F (36.4 C) 97.6 F (36.4 C)  TempSrc:   Oral Axillary  Resp: 20 16 19 17   Height:      Weight:      SpO2: 97% 98% 96% 95%    Intake/Output Summary (Last 24 hours) at 08/03/15 0733 Last data filed at 08/03/15 0600  Gross per 24 hour  Intake 2362.04 ml  Output     50 ml  Net 2312.04 ml   Filed Weights   08/02/15 1651 08/02/15 2051  Weight: 72.576 kg (160 lb) 68.9 kg (151 lb 14.4 oz)    General appearance: distracted and no distress Resp: clear to auscultation bilaterally Cardio: S1, S2 is irregularly irregular. No S3 s4. No rubs. Systolic murmur appreciated over the precordium. No pedal edema.Marland Kitchen GI: soft, non-tender; bowel sounds normal; no masses,  no organomegaly Extremities: extremities normal, atraumatic, no cyanosis or edema Neurologic: Sleepy but arousable. No facial asymmetry. Motor strength is equal bilateral upper and lower extremity. Generalized weakness is appreciated.  Lab Results:  Basic Metabolic  Panel:  Recent Labs Lab 08/02/15 1702 08/02/15 1728 08/02/15 2300 08/03/15 0105 08/03/15 0626  NA 139 141  --  148* 148*  K 4.1 4.1  --  3.3* 3.6  CL 95* 101  --  112* 113*  CO2 28  --   --  26 28  GLUCOSE 970* >700*  --  315* 158*  BUN 92* 83*  --  71* 64*  CREATININE 2.35* 2.00*  --  1.75* 1.50*  CALCIUM 9.9  --   --  9.2 8.8*  MG  --   --   --  2.2  --   PHOS  --   --  1.2*  --   --     CBC:  Recent Labs Lab 08/02/15 1702 08/02/15 1728  WBC 8.7  --   NEUTROABS 6.4  --   HGB 16.3* 17.3*  HCT 48.2* 51.0*  MCV 93.8  --   PLT 189  --    Cardiac Enzymes:  Recent Labs Lab 08/02/15 1702 08/02/15 2330 08/03/15 0626  TROPONINI 0.04* 0.03 0.03   CBG:  Recent Labs Lab 08/03/15 0221 08/03/15 0325 08/03/15 0518 08/03/15 0622 08/03/15 0725  GLUCAP 207* 167* 147* 155* 139*    Recent Results (from the past 240 hour(s))  MRSA PCR Screening     Status: Abnormal   Collection Time: 08/02/15  9:14 PM  Result Value Ref Range Status   MRSA by PCR POSITIVE (A) NEGATIVE Final    Comment:        The GeneXpert MRSA Assay (FDA approved for NASAL specimens only), is one component of a comprehensive MRSA colonization surveillance program. It is not intended to diagnose MRSA infection nor to guide or monitor treatment for MRSA infections. RESULT CALLED TO, READ BACK BY AND VERIFIED WITH: B REAP RN 2324 08/02/15 A BROWNING       Studies/Results: Dg Chest Port 1 View  08/02/2015   CLINICAL DATA:  Altered mental status for 2 days.  Weakness.  EXAM: PORTABLE CHEST 1 VIEW  COMPARISON:  03/23/2014  FINDINGS: Left base atelectasis or infiltrate. Right lung is clear. Heart is borderline in size. No effusions. No acute bony abnormality. Degenerative changes in the shoulders.  IMPRESSION: Left base atelectasis or infiltrate.   Electronically Signed   By: Rolm Baptise M.D.   On: 08/02/2015 17:36    Medications:  Scheduled: . cefTRIAXone (ROCEPHIN) IVPB 1 gram/50 mL D5W  1 g Intravenous Q24H  . enoxaparin (LOVENOX) injection  30 mg Subcutaneous Q24H  . [START ON 08/04/2015] Influenza vac split quadrivalent PF  0.5 mL Intramuscular Tomorrow-1000  . insulin aspart  0-24 Units Subcutaneous 6 times per day  . insulin detemir  10 Units Subcutaneous Daily   Continuous: . sodium chloride    . dextrose 5 % and 0.45% NaCl 125 mL/hr at 08/03/15 0222    PRN:  Assessment/Plan:  Principal Problem:   Diabetes mellitus with hyperosmolarity Active Problems:   UTI (lower urinary tract infection)   Atrial fibrillation   Acute hypernatremia   Prerenal azotemia   Elevated troponin    Acute encephalopathy Multifactorial including UTI, hyperglycemia, metabolic derangements. No focal deficits noted. Reorient daily. Start mobilizing. Baseline mental status is not clearly known.  Nonketotic hyperglycemia Patient was placed on an insulin infusion. Blood sugars are much better. HbA1c is pending. Can be transitioned to long-acting insulin. Unclear what she takes for diabetes at home. Home medication list is not accurate. Pharmacy to pursue this further.  Urinary tract  infection Urine cultures are pending. Continue ceftriaxone.  Dehydration with hypernatremia/hypokalemia Continue IV fluids. Potassium level is improved. Phosphorus was supplemented.  Acute renal failure Most likely prerenal in etiology. Renal function is improving with IV fluids. Monitor urine output. Continue to check labs daily.  Atrial fibrillation with mild RVR Heart rate is reasonably well controlled. She is not requiring any rate control medications at this time. Cardiology is following. Recommendation is to take aspirin. Apparently, she has refused anticoagulation in the past.  Abnormal EKG Diffuse ST depression noted on EKG. Cardiology is following. Troponin was mildly elevated but subsequent levels and normal. Patient denies any chest pain. She was not considered a candidate for cardiac catheterization. Echocardiogram report as above. Wall motion abnormalities cannot be ruled out, but her systolic function is normal.   DVT Prophylaxis: Lovenox    Code Status: DO NOT RESUSCITATE  Family Communication: No family at bedside. Apparently patient's husband has dementia  Disposition Plan: Transition to subcutaneous insulin. PT and OT to be ordered.    LOS: 1 day    [facility] Pager [phone] 08/03/2015, 7:33 AM  If 7PM-7AM, please contact night-coverage at [email or web address], password [facility]

## 2015-08-03 NOTE — Evaluation (Signed)
Clinical/Bedside Swallow Evaluation Patient Details  Name: Tara Huff MRN: [id number] Date of Birth: Feb 18, 1934  Today's Date: 08/03/2015 Time: SLP Start Time (ACUTE ONLY): 7106 SLP Stop Time (ACUTE ONLY): 1510 SLP Time Calculation (min) (ACUTE ONLY): 17 min  Past Medical History:  Past Medical History  Diagnosis Date  . Hypertension   . Atrial fibrillation   . MI (myocardial infarction)   . Anxiety   . Hypercholesterolemia   . Shoulder pain   . Sleep apnea     intolerant to CPAP  . Type 2 diabetes mellitus   . OSA (obstructive sleep apnea)   . Hypertensive heart disease   . Morbid obesity   . Pyogenic granuloma   . Cancer     cervical  . Depression   . Pneumonia   . CHF (congestive heart failure)   . Cardiac arrest 11/14/2013  . Complete heart block 11/14/2013  . OSTEOMYELITIS 04/28/2010    L great Toe      . Carcinoma of endometrium 11/20/2013   Past Surgical History:  Past Surgical History  Procedure Laterality Date  . Knee surgery      at age 26  . Cataract surgery      both eyes  . Abdominal hysterectomy    . Toe amputation    . Temporary pacemaker insertion N/A 11/14/2013    Procedure: TEMPORARY PACEMAKER INSERTION;  Surgeon: Peter M Martinique, MD;  Location: [facility] CATH LAB;  Service: Cardiovascular;  Laterality: N/A;   HPI:  81 y.o. female with below past medical history atrial fibrillation, Hypertension, MI, anxiety,depression, hypercholesterolemia, shoulder pain, type 2 diabetes mellitus, CHF, OSA brought to the emergency department after she was found to be weak, malaised and confused when evaluated by home health nursing earlier in the day. Per MD note pt lives at home with her husband, who apparently has a history of dementia. CXR left base atelectasis or infiltrate. BSE 11/16/13 was WFL's (dried lips,cracked), Dys 3 texture and thin liquids recommended.   Assessment / Plan / Recommendation Clinical Impression  Pt requiring moderate redirection due to decreased  sustained attention to SLP/activity and confusion. Pt and husband deny swallow deficits prior to admission. Oral and pharyngeal phases of swallow WFL's without evidence of aspiration (cannot rule out silent). Independently using tongue sweep to remove cracker residue. Recommend regular texture, thin liquids, pills with thin and brief follow up ST given current infiltrates on CXR and confusion.     Aspiration Risk   (mild-mod)    Diet Recommendation Age appropriate regular solids;Thin   Medication Administration: Whole meds with liquid Compensations: Slow rate;Small sips/bites    Other  Recommendations Oral Care Recommendations: Oral care BID   Follow Up Recommendations       Frequency and Duration min 2x/week  2 weeks   Pertinent Vitals/Pain none    SLP Swallow Goals     Swallow Study Prior Functional Status       General Other Pertinent Information: 81 y.o. female with below past medical history atrial fibrillation, Hypertension, MI, anxiety,depression, hypercholesterolemia, shoulder pain, type 2 diabetes mellitus, CHF, OSA brought to the emergency department after she was found to be weak, malaised and confused when evaluated by home health nursing earlier in the day. Per MD note pt lives at home with her husband, who apparently has a history of dementia. CXR left base atelectasis or infiltrate. BSE 11/16/13 was WFL's (dried lips,cracked), Dys 3 texture and thin liquids recommended. Type of Study: Bedside swallow evaluation Previous Swallow Assessment:  (  see H & P) Diet Prior to this Study: Regular;Thin liquids Temperature Spikes Noted: No Respiratory Status: Room air History of Recent Intubation: No Behavior/Cognition: Alert;Cooperative;Pleasant mood;Confused;Requires cueing Oral Cavity - Dentition:  (missing several teeth) Self-Feeding Abilities: Able to feed self Patient Positioning: Upright in bed Baseline Vocal Quality: Normal Volitional Cough: Weak Volitional Swallow:  Unable to elicit    Oral/Motor/Sensory Function Overall Oral Motor/Sensory Function: Appears within functional limits for tasks assessed   Ice Chips Ice chips: Not tested   Thin Liquid Thin Liquid: Within functional limits Presentation: Cup;Straw    Nectar Thick Nectar Thick Liquid: Not tested   Honey Thick Honey Thick Liquid: Not tested   Puree Puree: Within functional limits   Solid   GO    Solid: Within functional limits       Houston Siren 08/03/2015,3:24 PM  Orbie Pyo Colvin Caroli.Ed [employer] [phone]

## 2015-08-04 DIAGNOSIS — G9340 Encephalopathy, unspecified: Secondary | ICD-10-CM | POA: Insufficient documentation

## 2015-08-04 DIAGNOSIS — N390 Urinary tract infection, site not specified: Secondary | ICD-10-CM | POA: Diagnosis not present

## 2015-08-04 DIAGNOSIS — R739 Hyperglycemia, unspecified: Secondary | ICD-10-CM

## 2015-08-04 LAB — CBC
HCT: 36.4 % (ref 36.0–46.0)
Hemoglobin: 12.9 g/dL (ref 12.0–15.0)
MCH: 31.9 pg (ref 26.0–34.0)
MCHC: 35.4 g/dL (ref 30.0–36.0)
MCV: 90.1 fL (ref 78.0–100.0)
PLATELETS: 127 10*3/uL — AB (ref 150–400)
RBC: 4.04 MIL/uL (ref 3.87–5.11)
RDW: 13.1 % (ref 11.5–15.5)
WBC: 8.4 10*3/uL (ref 4.0–10.5)

## 2015-08-04 LAB — BASIC METABOLIC PANEL
ANION GAP: 8 (ref 5–15)
BUN: 39 mg/dL — AB (ref 6–20)
CALCIUM: 8.2 mg/dL — AB (ref 8.9–10.3)
CO2: 27 mmol/L (ref 22–32)
Chloride: 104 mmol/L (ref 101–111)
Creatinine, Ser: 1.18 mg/dL — ABNORMAL HIGH (ref 0.44–1.00)
GFR calc Af Amer: 50 mL/min — ABNORMAL LOW (ref >60)
GFR, EST NON AFRICAN AMERICAN: 43 mL/min — AB (ref >60)
GLUCOSE: 116 mg/dL — AB (ref 65–99)
POTASSIUM: 3.3 mmol/L — AB (ref 3.5–5.1)
SODIUM: 139 mmol/L (ref 135–145)

## 2015-08-04 LAB — C DIFFICILE QUICK SCREEN W PCR REFLEX
C DIFFICLE (CDIFF) ANTIGEN: POSITIVE — AB
C Diff toxin: NEGATIVE

## 2015-08-04 LAB — GLUCOSE, CAPILLARY
GLUCOSE-CAPILLARY: 108 mg/dL — AB (ref 65–99)
GLUCOSE-CAPILLARY: 124 mg/dL — AB (ref 65–99)
Glucose-Capillary: 123 mg/dL — ABNORMAL HIGH (ref 65–99)
Glucose-Capillary: 137 mg/dL — ABNORMAL HIGH (ref 65–99)
Glucose-Capillary: 161 mg/dL — ABNORMAL HIGH (ref 65–99)
Glucose-Capillary: 166 mg/dL — ABNORMAL HIGH (ref 65–99)

## 2015-08-04 MED ORDER — HALOPERIDOL LACTATE 5 MG/ML IJ SOLN
2.0000 mg | Freq: Once | INTRAMUSCULAR | Status: AC
  Administered 2015-08-04: 2 mg via INTRAVENOUS
  Filled 2015-08-04: qty 1

## 2015-08-04 MED ORDER — POTASSIUM CHLORIDE CRYS ER 20 MEQ PO TBCR
40.0000 meq | EXTENDED_RELEASE_TABLET | Freq: Once | ORAL | Status: AC
  Administered 2015-08-04: 40 meq via ORAL
  Filled 2015-08-04: qty 2

## 2015-08-04 MED ORDER — ENOXAPARIN SODIUM 40 MG/0.4ML ~~LOC~~ SOLN
40.0000 mg | SUBCUTANEOUS | Status: DC
  Administered 2015-08-04 – 2015-08-05 (×2): 40 mg via SUBCUTANEOUS
  Filled 2015-08-04: qty 0.4

## 2015-08-04 MED ORDER — METRONIDAZOLE 500 MG PO TABS
500.0000 mg | ORAL_TABLET | Freq: Three times a day (TID) | ORAL | Status: DC
  Administered 2015-08-04 – 2015-08-08 (×9): 500 mg via ORAL
  Filled 2015-08-04 (×11): qty 1

## 2015-08-04 NOTE — Progress Notes (Signed)
Patient ID: Tara Huff, female   DOB: [birth year], 81 y.o.   MRN: [id number]    Primary cardiologist:  Subjective:    No complaints  Objective:   Temp:  [97.4 F (36.3 C)-98.5 F (36.9 C)] 97.9 F (36.6 C) (09/25 0008) Pulse Rate:  [68-92] 83 (09/25 0008) Resp:  [14-26] 21 (09/24 1945) BP: (67-125)/(40-57) 98/57 mmHg (09/25 0008) SpO2:  [94 %-100 %] 95 % (09/25 0008) Last BM Date: 08/03/15  Filed Weights   08/02/15 1651 08/02/15 2051  Weight: 160 lb (72.576 kg) 151 lb 14.4 oz (68.9 kg)    Intake/Output Summary (Last 24 hours) at 08/04/15 0532 Last data filed at 08/04/15 0300  Gross per 24 hour  Intake 3755.56 ml  Output      0 ml  Net 3755.56 ml    Telemetry: Afib 70-90s  Exam:  General: NAD  Resp: CTAB  Cardiac: irreg, no m/r/g, no jvd  GI: abdomen soft, NT, ND  MSK: no LE edema  Neuro: no focal deficits   Lab Results:  Basic Metabolic Panel:  Recent Labs Lab 08/03/15 0105 08/03/15 0626 08/04/15 0312  NA 148* 148* 139  K 3.3* 3.6 3.3*  CL 112* 113* 104  CO2 26 28 27   GLUCOSE 315* 158* 116*  BUN 71* 64* 39*  CREATININE 1.75* 1.50* 1.18*  CALCIUM 9.2 8.8* 8.2*  MG 2.2  --   --     Liver Function Tests: No results for input(s): AST, ALT, ALKPHOS, BILITOT, PROT, ALBUMIN in the last 168 hours.  CBC:  Recent Labs Lab 08/02/15 1702 08/02/15 1728 08/04/15 0312  WBC 8.7  --  8.4  HGB 16.3* 17.3* 12.9  HCT 48.2* 51.0* 36.4  MCV 93.8  --  90.1  PLT 189  --  127*    Cardiac Enzymes:  Recent Labs Lab 08/02/15 2330 08/03/15 0626 08/03/15 1215  TROPONINI 0.03 0.03 <0.03    BNP: No results for input(s): PROBNP in the last 8760 hours.  Coagulation: No results for input(s): INR in the last 168 hours.  ECG:   Medications:   Scheduled Medications: . cefTRIAXone (ROCEPHIN) IVPB 1 gram/50 mL D5W  1 g Intravenous Q24H  . Chlorhexidine Gluconate Cloth  6 each Topical Q0600  . enoxaparin (LOVENOX) injection  30 mg Subcutaneous  Q24H  . feeding supplement (ENSURE ENLIVE)  237 mL Oral BID BM  . Influenza vac split quadrivalent PF  0.5 mL Intramuscular Tomorrow-1000  . insulin aspart  0-24 Units Subcutaneous 6 times per day  . insulin detemir  10 Units Subcutaneous Daily  . mupirocin ointment  1 application Nasal BID     Infusions: . sodium chloride 100 mL/hr at 08/03/15 2006     PRN Medications:       Assessment/Plan    1. Afib - not requiring AV nodal agents at this time. Soft bp's in setting of dehydration, her coreg is on hold, can restart once bp's improve.  - from cards clinic notes she has previously refused anticoag, and when on previously on had "bleeding in her eye" - start ASA when taking po   2. Abnormal EKG - patient presented with generalized weakness, AMS.  - EKG concerning for global ischemia with ST elevation in aVR and diffuse ST depressions. Patient with no cardiac symptoms - code STEMI cancelled after review of EKG at time of initial consult. Thought in general to be a poor cath candidate due to age, fragility, and advanced comorbidities. Recs at that time were  for rate control of her afib and medical management. Normal dig level - troponins negative. Echo LVEF 55-60%, cannot exclude WMAs - no plans for ischemic testing at this time.    3. DM2 with hyperglycemia - management per primary team  4. AKI - likely prerenal, improving with IVF - multiple electrolyte abnormalities that are improving.        Carlyle Dolly, M.D.

## 2015-08-04 NOTE — Evaluation (Signed)
Physical Therapy Evaluation Patient Details Name: Tara Huff MRN: [id number] DOB: 1934-01-06 Today's Date: 08/04/2015   History of Present Illness  Patient is a 81 yo female admitted 08/02/15 with AMS, weakness, hyperglycemia, Afib with RVR, UTI.   PMH:  Afib, HTN, MI, anxiety, depression, DM, CHF, pacemaker, shoulder pain  Clinical Impression  Patient presents with problems listed below.  Will benefit from acute PT to maximize mobility prior to discharge.  Patient requiring +2 max assist for mobility, with decreased cognition.  Recommend SNF at discharge for continued therapy.    Follow Up Recommendations SNF    Equipment Recommendations  Rolling walker with 5" wheels    Recommendations for Other Services       Precautions / Restrictions Precautions Precautions: Fall Precaution Comments: Patient with mittens on hands Restrictions Weight Bearing Restrictions: No      Mobility  Bed Mobility Overal bed mobility: Needs Assistance;+2 for physical assistance Bed Mobility: Supine to Sit;Sit to Supine     Supine to sit: Mod assist;+2 for physical assistance Sit to supine: Max assist;+2 for physical assistance   General bed mobility comments: Verbal and tactile cues to move to EOB.  Initially patient became agitated.  Able to calm patient and continue session.  Required mod assist to raise trunk to upright position.  Patient with posterior lean, pushing back into extension at hips/trunk.  Required mod - max assist to maintain sitting balance.  Able to sit EOB x 6 minutes.  Patient having diarrhea.  Returned to supine with +2 max assist.  Nsg called to assist with cleaning patient.  Transfers                 General transfer comment: Unable  Ambulation/Gait                Stairs            Wheelchair Mobility    Modified Rankin (Stroke Patients Only)       Balance Overall balance assessment: Needs assistance Sitting-balance support: Bilateral upper  extremity supported;Feet supported Sitting balance-Leahy Scale: Zero Sitting balance - Comments: Patient pushing into extension.  Attempted to have patient shift trunk forward over hips.  Patient unable to focus on task at hand - internally and externally distracted.  Required mod-max assist to maintain static sitting balance. Postural control: Posterior lean                                   Pertinent Vitals/Pain Pain Assessment: No/denies pain    Home Living Family/patient expects to be discharged to:: Skilled nursing facility Living Arrangements: Spouse/significant other (per chart, husband with ? h/o dementia)               Additional Comments: Patient unable to provide reliable information    Prior Function Level of Independence:  (Unsure)         Comments: Patient unable to give reliable information     Hand Dominance        Extremity/Trunk Assessment   Upper Extremity Assessment: Overall WFL for tasks assessed           Lower Extremity Assessment: Generalized weakness         Communication   Communication: No difficulties  Cognition Arousal/Alertness: Awake/alert Behavior During Therapy: Restless;Agitated;Anxious Overall Cognitive Status: Impaired/Different from baseline Area of Impairment: Orientation;Attention;Memory;Following commands;Safety/judgement;Awareness;Problem solving Orientation Level: Disoriented to;Place;Time;Situation Current Attention Level: Focused Memory: Decreased short-term  memory Following Commands: Follows one step commands with increased time Safety/Judgement: Decreased awareness of deficits;Decreased awareness of safety   Problem Solving: Slow processing;Difficulty sequencing;Requires verbal cues;Requires tactile cues General Comments: Patient reports the cats are laying on her socks (on the bedside table).  Pleasantly confused initially, then became slightly agitated.  Able to calm patient.    General  Comments      Exercises        Assessment/Plan    PT Assessment Patient needs continued PT services  PT Diagnosis Difficulty walking;Generalized weakness;Altered mental status   PT Problem List Decreased strength;Decreased activity tolerance;Decreased balance;Decreased mobility;Decreased cognition;Decreased knowledge of use of DME;Decreased safety awareness  PT Treatment Interventions DME instruction;Gait training;Functional mobility training;Therapeutic activities;Balance training;Cognitive remediation;Patient/family education   PT Goals (Current goals can be found in the Care Plan section) Acute Rehab PT Goals Patient Stated Goal: Unable to state PT Goal Formulation: Patient unable to participate in goal setting Time For Goal Achievement: 08/18/15 Potential to Achieve Goals: Fair    Frequency Min 3X/week   Barriers to discharge        Co-evaluation               End of Session   Activity Tolerance: Treatment limited secondary to agitation (Limited by decreased cognition) Patient left: in bed;with call bell/phone within reach;with bed alarm set;with restraints reapplied (mittens reapplied) Nurse Communication: Mobility status (Needs to be cleaned)         Time: 2482-5003 PT Time Calculation (min) (ACUTE ONLY): 15 min   Charges:   PT Evaluation $Initial PT Evaluation Tier I: 1 Procedure     PT G CodesDespina Huff 08/09/2015, 4:49 PM Tara Huff. Tara Huff, [place] Pager [phone]

## 2015-08-04 NOTE — Progress Notes (Signed)
Yelling out and crying that she is seeing snakes. Medicated with dose of Haldol as ordered by MD.

## 2015-08-04 NOTE — Progress Notes (Addendum)
[facility] PROGRESS NOTE  Tara Huff VOH:[id number] DOB: 02-08-1934 DOA: 08/02/2015  PCP: Reginia Naas, MD  Brief HPI: 81 year old Caucasian female with a past medical history of atrial fibrillation, hypertension, previous MI, depression, was brought into the emergency department by EMS for weakness and confusion. She was found to have atrial fibrillation with mild RVR. She was noted to have a urinary tract infection. She was also noted to be profoundly hyperglycemic and was started on an insulin infusion. She was hospitalized for further management.  Past medical history:  Past Medical History  Diagnosis Date  . Hypertension   . Atrial fibrillation   . MI (myocardial infarction)   . Anxiety   . Hypercholesterolemia   . Shoulder pain   . Sleep apnea     intolerant to CPAP  . Type 2 diabetes mellitus   . OSA (obstructive sleep apnea)   . Hypertensive heart disease   . Morbid obesity   . Pyogenic granuloma   . Cancer     cervical  . Depression   . Pneumonia   . CHF (congestive heart failure)   . Cardiac arrest 11/14/2013  . Complete heart block 11/14/2013  . OSTEOMYELITIS 04/28/2010    L great Toe      . Carcinoma of endometrium 11/20/2013    Consultants: Cardiology  Procedures:  Echocardiogram Study Conclusions - Left ventricle: The cavity size was normal. Systolic function wasnormal. The estimated ejection fraction was in the range of 55%to 60%. Regional wall motion abnormalities cannot be excluded.The study was not technically sufficient to allow evaluation ofLV diastolic dysfunction due to atrial fibrillation. - Aortic valve: Trileaflet; mildly thickened, mildly calcifiedleaflets. There was no regurgitation. - Aortic root: The aortic root was normal in size. - Mitral valve: Structurally normal valve. There was noregurgitation. - Left atrium: The atrium was normal in size. - Right ventricle: Systolic function was normal. - Tricuspid valve: There  was trivial regurgitation. - Pulmonary arteries: Systolic pressure was within the normalrange. - Inferior vena cava: The vessel was normal in size. - Pericardium, extracardiac: There was no pericardial effusion.   Antibiotics: Ceftriaxone  Subjective: Patient appears to be confused. She denies any pain. Denies any difficulty breathing. No nausea, vomiting.   Objective: Vital Signs  Filed Vitals:   08/03/15 1600 08/03/15 1945 08/04/15 0008 08/04/15 0500  BP: 67/44 92/47 98/57  92/45  Pulse: 86 92 83   Temp: 98.5 F (36.9 C) 97.8 F (36.6 C) 97.9 F (36.6 C) 98.5 F (36.9 C)  TempSrc: Axillary Oral Oral Oral  Resp: 17 21  13   Height:      Weight:      SpO2: 100% 97% 95% 95%    Intake/Output Summary (Last 24 hours) at 08/04/15 0828 Last data filed at 08/04/15 0600  Gross per 24 hour  Intake   3675 ml  Output      0 ml  Net   3675 ml   Filed Weights   08/02/15 1651 08/02/15 2051  Weight: 72.576 kg (160 lb) 68.9 kg (151 lb 14.4 oz)    General appearance: Alert. Agitated. In no distress. Resp: clear to auscultation bilaterally Cardio: S1, S2 is irregularly irregular. No S3 s4. No rubs. Systolic murmur appreciated over the precordium. No pedal edema.Marland Kitchen GI: soft, non-tender; bowel sounds normal; no masses,  no organomegaly Extremities: extremities normal, atraumatic, no cyanosis or edema Neurologic: Awake, alert, agitated. Appears to be hallucinating. No facial asymmetry. Motor strength is equal bilateral upper and lower extremity. Generalized  weakness is appreciated.  Lab Results:  Basic Metabolic Panel:  Recent Labs Lab 08/02/15 1702 08/02/15 1728 08/02/15 2300 08/03/15 0105 08/03/15 0626 08/04/15 0312  NA 139 141  --  148* 148* 139  K 4.1 4.1  --  3.3* 3.6 3.3*  CL 95* 101  --  112* 113* 104  CO2 28  --   --  26 28 27   GLUCOSE 970* >700*  --  315* 158* 116*  BUN 92* 83*  --  71* 64* 39*  CREATININE 2.35* 2.00*  --  1.75* 1.50* 1.18*  CALCIUM 9.9  --   --   9.2 8.8* 8.2*  MG  --   --   --  2.2  --   --   PHOS  --   --  1.2*  --   --   --    CBC:  Recent Labs Lab 08/02/15 1702 08/02/15 1728 08/04/15 0312  WBC 8.7  --  8.4  NEUTROABS 6.4  --   --   HGB 16.3* 17.3* 12.9  HCT 48.2* 51.0* 36.4  MCV 93.8  --  90.1  PLT 189  --  127*   Cardiac Enzymes:  Recent Labs Lab 08/02/15 1702 08/02/15 2330 08/03/15 0626 08/03/15 1215  TROPONINI 0.04* 0.03 0.03 <0.03   CBG:  Recent Labs Lab 08/03/15 1758 08/03/15 1947 08/04/15 0046 08/04/15 0536 08/04/15 0754  GLUCAP 213* 161* 137* 123* 108*    Recent Results (from the past 240 hour(s))  MRSA PCR Screening     Status: Abnormal   Collection Time: 08/02/15  9:14 PM  Result Value Ref Range Status   MRSA by PCR POSITIVE (A) NEGATIVE Final    Comment:        The GeneXpert MRSA Assay (FDA approved for NASAL specimens only), is one component of a comprehensive MRSA colonization surveillance program. It is not intended to diagnose MRSA infection nor to guide or monitor treatment for MRSA infections. RESULT CALLED TO, READ BACK BY AND VERIFIED WITH: B REAP RN [id number] 08/02/15 A BROWNING       Studies/Results: Dg Chest Port 1 View  08/02/2015   CLINICAL DATA:  Altered mental status for 2 days.  Weakness.  EXAM: PORTABLE CHEST 1 VIEW  COMPARISON:  03/23/2014  FINDINGS: Left base atelectasis or infiltrate. Right lung is clear. Heart is borderline in size. No effusions. No acute bony abnormality. Degenerative changes in the shoulders.  IMPRESSION: Left base atelectasis or infiltrate.   Electronically Signed   By: Rolm Baptise M.D.   On: 08/02/2015 17:36    Medications:  Scheduled: . cefTRIAXone (ROCEPHIN) IVPB 1 gram/50 mL D5W  1 g Intravenous Q24H  . Chlorhexidine Gluconate Cloth  6 each Topical Q0600  . enoxaparin (LOVENOX) injection  30 mg Subcutaneous Q24H  . feeding supplement (ENSURE ENLIVE)  237 mL Oral BID BM  . Influenza vac split quadrivalent PF  0.5 mL Intramuscular  Tomorrow-1000  . insulin aspart  0-24 Units Subcutaneous 6 times per day  . insulin detemir  10 Units Subcutaneous Daily  . mupirocin ointment  1 application Nasal BID  . potassium chloride  40 mEq Oral Once   Continuous: . sodium chloride 100 mL/hr at 08/04/15 0603   PRN:  Assessment/Plan:  Principal Problem:   Diabetes mellitus with hyperosmolarity Active Problems:   UTI (lower urinary tract infection)   Atrial fibrillation   Acute hypernatremia   Prerenal azotemia   Elevated troponin   Abnormal EKG   AKI (  acute kidney injury)   Atrial fibrillation, unspecified    Acute encephalopathy Multifactorial including UTI, hyperglycemia, metabolic derangements. No focal deficits noted. Reorient daily. She appears to be hallucinating this morning. We will give her a dose of Haldol. Start mobilizing. Baseline mental status is not clearly known.  Loose stools Stool will be sent for C. difficile.  Nonketotic hyperglycemia Patient was placed on an insulin infusion at the time of admission. Blood sugars are much better. HbA1c is pending. She was transitioned to subcutaneous insulin. Blood sugars are reasonably well controlled. Can be transitioned to long-acting insulin. Unclear what she takes for diabetes at home. Home medication list does not mention any glucose lowering agents. This could be new onset diabetes.  Urinary tract infection Urine cultures are pending. Continue ceftriaxone.  Dehydration with hypernatremia/hypokalemia Seems to be improving. Sodium level is better. BUN and creatinine improving. Supplement potassium.  Acute renal failure Most likely prerenal in etiology. Renal function is improving with IV fluids. Monitor urine output. Continue to check labs daily.  Atrial fibrillation with mild RVR Heart rate is reasonably well controlled. Blood pressure is noted to be low this morning. She is asymptomatic. Continue to monitor. She is not requiring any rate control  medications at this time. Cardiology is following. Recommendation is to take aspirin. Apparently, she has refused anticoagulation in the past.  Abnormal EKG Diffuse ST depression noted on EKG. Cardiology is following. Troponin was mildly elevated but subsequent levels and normal. Patient denies any chest pain. She was not considered a candidate for cardiac catheterization. Echocardiogram report as above. Wall motion abnormalities cannot be ruled out, but her systolic function is normal.  ADDENDUM Patient with frequent loose stools. C diff was sent. Results suggests colonization but since patient has symptoms will start Flagyl.  DVT Prophylaxis: Lovenox    Code Status: DO NOT RESUSCITATE  Family Communication: No family at bedside.  Disposition Plan: Continue monitoring in step down unit for now due to her agitation. PT and OT has been ordered.    LOS: 2 days   [facility] Pager [phone] 08/04/2015, 8:28 AM  If 7PM-7AM, please contact night-coverage at [email or web address], password [facility]

## 2015-08-04 NOTE — Progress Notes (Signed)
After having multiple stools; MD called and got order for CDiff by PCR; Results positive. Dr. [place] Pink informed and order put in for Flagyl.

## 2015-08-05 DIAGNOSIS — E1100 Type 2 diabetes mellitus with hyperosmolarity without nonketotic hyperglycemic-hyperosmolar coma (NKHHC): Secondary | ICD-10-CM

## 2015-08-05 DIAGNOSIS — A0472 Enterocolitis due to Clostridium difficile, not specified as recurrent: Secondary | ICD-10-CM | POA: Diagnosis present

## 2015-08-05 DIAGNOSIS — I480 Paroxysmal atrial fibrillation: Secondary | ICD-10-CM

## 2015-08-05 LAB — URINE CULTURE: Culture: >=100000

## 2015-08-05 LAB — BASIC METABOLIC PANEL
Anion gap: 5 (ref 5–15)
BUN: 21 mg/dL — ABNORMAL HIGH (ref 6–20)
CHLORIDE: 109 mmol/L (ref 101–111)
CO2: 26 mmol/L (ref 22–32)
CREATININE: 1.04 mg/dL — AB (ref 0.44–1.00)
Calcium: 8.5 mg/dL — ABNORMAL LOW (ref 8.9–10.3)
GFR calc non Af Amer: 50 mL/min — ABNORMAL LOW (ref >60)
GFR, EST AFRICAN AMERICAN: 58 mL/min — AB (ref >60)
Glucose, Bld: 155 mg/dL — ABNORMAL HIGH (ref 65–99)
POTASSIUM: 3.2 mmol/L — AB (ref 3.5–5.1)
Sodium: 140 mmol/L (ref 135–145)

## 2015-08-05 LAB — GLUCOSE, CAPILLARY
GLUCOSE-CAPILLARY: 167 mg/dL — AB (ref 65–99)
GLUCOSE-CAPILLARY: 146 mg/dL — AB (ref 65–99)
GLUCOSE-CAPILLARY: 124 mg/dL — AB (ref 65–99)
Glucose-Capillary: 213 mg/dL — ABNORMAL HIGH (ref 65–99)
Glucose-Capillary: 187 mg/dL — ABNORMAL HIGH (ref 65–99)
Glucose-Capillary: 265 mg/dL — ABNORMAL HIGH (ref 65–99)

## 2015-08-05 LAB — CBC
HEMATOCRIT: 34.6 % — AB (ref 36.0–46.0)
HEMOGLOBIN: 12 g/dL (ref 12.0–15.0)
MCH: 31.3 pg (ref 26.0–34.0)
MCHC: 34.7 g/dL (ref 30.0–36.0)
MCV: 90.1 fL (ref 78.0–100.0)
Platelets: 105 10*3/uL — ABNORMAL LOW (ref 150–400)
RBC: 3.84 MIL/uL — ABNORMAL LOW (ref 3.87–5.11)
RDW: 13.3 % (ref 11.5–15.5)
WBC: 5.5 10*3/uL (ref 4.0–10.5)

## 2015-08-05 LAB — MAGNESIUM: MAGNESIUM: 1.7 mg/dL (ref 1.7–2.4)

## 2015-08-05 LAB — HEMOGLOBIN A1C
Hgb A1c MFr Bld: 14.8 % — ABNORMAL HIGH (ref 4.8–5.6)
Mean Plasma Glucose: 378 mg/dL

## 2015-08-05 MED ORDER — POTASSIUM CHLORIDE CRYS ER 20 MEQ PO TBCR
60.0000 meq | EXTENDED_RELEASE_TABLET | Freq: Once | ORAL | Status: AC
  Administered 2015-08-05: 60 meq via ORAL
  Filled 2015-08-05: qty 3

## 2015-08-05 MED ORDER — INSULIN ASPART 100 UNIT/ML ~~LOC~~ SOLN
0.0000 [IU] | Freq: Three times a day (TID) | SUBCUTANEOUS | Status: DC
  Administered 2015-08-05: 3 [IU] via SUBCUTANEOUS
  Administered 2015-08-05: 5 [IU] via SUBCUTANEOUS
  Administered 2015-08-06: 3 [IU] via SUBCUTANEOUS
  Administered 2015-08-06: 5 [IU] via SUBCUTANEOUS
  Administered 2015-08-06 – 2015-08-07 (×2): 3 [IU] via SUBCUTANEOUS
  Administered 2015-08-07 – 2015-08-08 (×3): 2 [IU] via SUBCUTANEOUS

## 2015-08-05 MED ORDER — QUETIAPINE FUMARATE 25 MG PO TABS
12.5000 mg | ORAL_TABLET | Freq: Every day | ORAL | Status: DC
  Administered 2015-08-05 – 2015-08-07 (×3): 12.5 mg via ORAL
  Filled 2015-08-05 (×3): qty 1

## 2015-08-05 NOTE — Progress Notes (Signed)
[facility] PROGRESS NOTE  Tara Huff WRU:[id number] DOB: 1934/05/14 DOA: 08/02/2015  PCP: Reginia Naas, MD  Brief HPI: 81 year old Caucasian female with a past medical history of atrial fibrillation, hypertension, previous MI, depression, was brought into the emergency department by EMS for weakness and confusion. She was found to have atrial fibrillation with mild RVR. She was noted to have a urinary tract infection. She was also noted to be profoundly hyperglycemic and was started on an insulin infusion. She was hospitalized for further management.  Past medical history:  Past Medical History  Diagnosis Date  . Hypertension   . Atrial fibrillation   . MI (myocardial infarction)   . Anxiety   . Hypercholesterolemia   . Shoulder pain   . Sleep apnea     intolerant to CPAP  . Type 2 diabetes mellitus   . OSA (obstructive sleep apnea)   . Hypertensive heart disease   . Morbid obesity   . Pyogenic granuloma   . Cancer     cervical  . Depression   . Pneumonia   . CHF (congestive heart failure)   . Cardiac arrest 11/14/2013  . Complete heart block 11/14/2013  . OSTEOMYELITIS 04/28/2010    L great Toe      . Carcinoma of endometrium 11/20/2013    Consultants: Cardiology  Procedures:  Echocardiogram Study Conclusions - Left ventricle: The cavity size was normal. Systolic function wasnormal. The estimated ejection fraction was in the range of 55%to 60%. Regional wall motion abnormalities cannot be excluded.The study was not technically sufficient to allow evaluation ofLV diastolic dysfunction due to atrial fibrillation. - Aortic valve: Trileaflet; mildly thickened, mildly calcifiedleaflets. There was no regurgitation. - Aortic root: The aortic root was normal in size. - Mitral valve: Structurally normal valve. There was noregurgitation. - Left atrium: The atrium was normal in size. - Right ventricle: Systolic function was normal. - Tricuspid valve: There  was trivial regurgitation. - Pulmonary arteries: Systolic pressure was within the normalrange. - Inferior vena cava: The vessel was normal in size. - Pericardium, extracardiac: There was no pericardial effusion.   Antibiotics: Ceftriaxone  Subjective: Patient remains confused. She denies any pain. Denies any difficulty breathing. No nausea, vomiting.   Objective: Vital Signs  Filed Vitals:   08/04/15 2135 08/04/15 2328 08/05/15 0000 08/05/15 0400  BP: 109/63 96/46 90/52  102/66  Pulse: 93 84 92 44  Temp: 98.3 F (36.8 C) 98.5 F (36.9 C)  98.3 F (36.8 C)  TempSrc: Oral Oral  Oral  Resp: 13 17 18 16   Height:      Weight:      SpO2: 93% 95% 97% 98%    Intake/Output Summary (Last 24 hours) at 08/05/15 0727 Last data filed at 08/05/15 0600  Gross per 24 hour  Intake 1875.42 ml  Output    200 ml  Net 1675.42 ml   Filed Weights   08/02/15 1651 08/02/15 2051  Weight: 72.576 kg (160 lb) 68.9 kg (151 lb 14.4 oz)    General appearance: Alert. Agitated. In no distress. Resp: clear to auscultation bilaterally Cardio: S1, S2 is irregularly irregular. No S3 s4. No rubs. Systolic murmur appreciated over the precordium. No pedal edema.Marland Kitchen GI: soft, non-tender; bowel sounds normal; no masses,  no organomegaly Extremities: extremities normal, atraumatic, no cyanosis or edema Neurologic: Awake, alert. She remains confused. No facial asymmetry. Motor strength is equal bilateral upper and lower extremity. Generalized weakness is appreciated.  Lab Results:  Basic Metabolic Panel:  Recent Labs  Lab 08/02/15 1702 08/02/15 1728 08/02/15 2300 08/03/15 0105 08/03/15 0626 08/04/15 0312 08/05/15 0309  NA 139 141  --  148* 148* 139 140  K 4.1 4.1  --  3.3* 3.6 3.3* 3.2*  CL 95* 101  --  112* 113* 104 109  CO2 28  --   --  26 28 27 26   GLUCOSE 970* >700*  --  315* 158* 116* 155*  BUN 92* 83*  --  71* 64* 39* 21*  CREATININE 2.35* 2.00*  --  1.75* 1.50* 1.18* 1.04*  CALCIUM 9.9  --    --  9.2 8.8* 8.2* 8.5*  MG  --   --   --  2.2  --   --   --   PHOS  --   --  1.2*  --   --   --   --    CBC:  Recent Labs Lab 08/02/15 1702 08/02/15 1728 08/04/15 0312 08/05/15 0309  WBC 8.7  --  8.4 5.5  NEUTROABS 6.4  --   --   --   HGB 16.3* 17.3* 12.9 12.0  HCT 48.2* 51.0* 36.4 34.6*  MCV 93.8  --  90.1 90.1  PLT 189  --  127* 105*   Cardiac Enzymes:  Recent Labs Lab 08/02/15 1702 08/02/15 2330 08/03/15 0626 08/03/15 1215  TROPONINI 0.04* 0.03 0.03 <0.03   CBG:  Recent Labs Lab 08/04/15 1200 08/04/15 1711 08/04/15 2042 08/04/15 2327 08/05/15 0431  GLUCAP 124* 166* 161* 167* 146*    Recent Results (from the past 240 hour(s))  Urine culture     Status: None (Preliminary result)   Collection Time: 08/02/15  5:56 PM  Result Value Ref Range Status   Specimen Description URINE, CATHETERIZED  Final   Special Requests ADDED ON AT 1822 08/02/15  Final   Culture >=100,000 COLONIES/mL ESCHERICHIA COLI  Final   Report Status PENDING  Incomplete  MRSA PCR Screening     Status: Abnormal   Collection Time: 08/02/15  9:14 PM  Result Value Ref Range Status   MRSA by PCR POSITIVE (A) NEGATIVE Final    Comment:        The GeneXpert MRSA Assay (FDA approved for NASAL specimens only), is one component of a comprehensive MRSA colonization surveillance program. It is not intended to diagnose MRSA infection nor to guide or monitor treatment for MRSA infections. RESULT CALLED TO, READ BACK BY AND VERIFIED WITH: B REAP RN 2324 08/02/15 A BROWNING   C difficile quick scan w PCR reflex     Status: Abnormal   Collection Time: 08/04/15  2:56 PM  Result Value Ref Range Status   C Diff antigen POSITIVE (A) NEGATIVE Final   C Diff toxin NEGATIVE NEGATIVE Final   C Diff interpretation   Final    C. difficile present, but toxin not detected. This indicates colonization. In most cases, this does not require treatment. If patient has signs and symptoms consistent with colitis,  consider treatment.      Studies/Results: No results found.  Medications:  Scheduled: . cefTRIAXone (ROCEPHIN) IVPB 1 gram/50 mL D5W  1 g Intravenous Q24H  . Chlorhexidine Gluconate Cloth  6 each Topical Q0600  . enoxaparin (LOVENOX) injection  40 mg Subcutaneous Q24H  . feeding supplement (ENSURE ENLIVE)  237 mL Oral BID BM  . insulin aspart  0-24 Units Subcutaneous 6 times per day  . insulin detemir  10 Units Subcutaneous Daily  . metroNIDAZOLE  500 mg Oral 3  times per day  . mupirocin ointment  1 application Nasal BID  . potassium chloride  60 mEq Oral Once  . QUEtiapine  12.5 mg Oral QHS   Continuous: . sodium chloride 75 mL/hr at 08/04/15 2111   PRN:  Assessment/Plan:  Principal Problem:   Diabetes mellitus with hyperosmolarity Active Problems:   UTI (lower urinary tract infection)   Atrial fibrillation   Acute hypernatremia   Prerenal azotemia   Elevated troponin   Abnormal EKG   AKI (acute kidney injury)   Atrial fibrillation, unspecified   Hyperglycemia   Acute encephalopathy    Acute encephalopathy Multifactorial including UTI, hyperglycemia, metabolic derangements. No focal deficits noted. Reorient daily. According to nursing staff who spoke to the patient's husband yesterday, this is her baseline. She apparently hasn't ambulated in about a year. She has around-the-clock nursing care at home.   Loose stools/possible C. difficile Stool was positive for C. difficile antigen but negative for toxin. This suggests conization. However, patient continues to have profuse diarrhea. She was started on Flagyl. Continue to monitor.  Nonketotic hyperglycemia Patient was placed on an insulin infusion at the time of admission. Blood sugars are much better. HbA1c is pending. She was transitioned to subcutaneous insulin. Blood sugars are reasonably well controlled. Unclear what she takes for diabetes at home. Home medication list does not mention any glucose lowering  agents. This could be new onset diabetes.  Urinary tract infection with Escherichia coli Continue ceftriaxone. Sensitivities noted. Change to oral antibiotics tomorrow.  Dehydration with hypernatremia/hypokalemia Seems to be improving. Sodium level is better. BUN and creatinine improving. Supplement potassium. Check magnesium  Acute renal failure Most likely prerenal in etiology. Renal function has improved with IV fluids. Monitor urine output.  Atrial fibrillation with mild RVR Heart rate is reasonably well controlled. Blood pressure is stable. She is asymptomatic. She is not requiring any rate control medications at this time. She was on digoxin at home, which has been held.  Apparently, she has refused anticoagulation in the past. Home medication list did suggest that she was on Eliquis, but unclear if she has taken that medication. Cardiology is following. Recommendation is to take aspirin.  Abnormal EKG Diffuse ST depression noted on EKG. Cardiology is following. Troponin was mildly elevated but subsequent levels and normal. Patient denies any chest pain. She was not considered a candidate for cardiac catheterization. Echocardiogram report as above. Wall motion abnormalities cannot be ruled out, but her systolic function is normal.  DVT Prophylaxis: Lovenox    Code Status: DO NOT RESUSCITATE  Family Communication: No family at bedside. Tried calling her husband with no success. Will try again later. Disposition Plan: Okay for transfer to floor. PT and OT to follow. Will likely need placement.     LOS: 3 days   [facility] Pager [phone] 08/05/2015, 7:27 AM  If 7PM-7AM, please contact night-coverage at [email or web address], password [facility]

## 2015-08-05 NOTE — Evaluation (Signed)
Occupational Therapy Evaluation and Discharge Patient Details Name: Tara Huff MRN: [id number] DOB: 26-Apr-1934 Today's Date: 08/05/2015    History of Present Illness Patient is a 81 yo female admitted 08/02/15 with AMS, weakness, hyperglycemia, Afib with RVR, UTI.   PMH:  Afib, HTN, MI, anxiety, depression, DM, CHF, pacemaker, shoulder pain   Clinical Impression   This 81 yo female admitted with above presents to acute OT at baseline for BADLs--but with AMS that continues. No further OT needs, we will sign off.    Follow Up Recommendations  No OT follow up;Supervision/Assistance - 24 hour (due to total care pta)    Equipment Recommendations  None recommended by OT       Precautions / Restrictions Precautions Precautions: Fall Precaution Comments: mittens not on hands today--pt fiddling with magazines that she has in the bed with her Restrictions Weight Bearing Restrictions: No      Mobility Bed Mobility               General bed mobility comments: Total A to scoot up in bed with HOB flat and in trendelenberg; otherwise pt not open to trying to move even in bed with my assist (ie :rolling)          ADL Overall ADL's : At baseline                                             Vision Additional Comments: Not able to assess, but does not appear to be different from baseline          Pertinent Vitals/Pain Pain Assessment: No/denies pain     Hand Dominance Right   Extremity/Trunk Assessment Upper Extremity Assessment Upper Extremity Assessment: Overall WFL for tasks assessed (moving arms/hands spontaneously in bed with magazines and moving cords around)           Communication Communication Communication: No difficulties   Cognition Arousal/Alertness: Awake/alert Behavior During Therapy: Restless;Agitated (thinks she is going home) Overall Cognitive Status: Impaired/Different from baseline                 General  Comments: Pt reports that that her husband is taking her home today, that he is in the car and that Jenny Reichmann or Wille Glaser are going to drive. SHe is yelling out for the boys to come on so they can go. She does not want me to do anything for her other than recepitve to me shifting her up in bed              Home Living Family/patient expects to be discharged to:: Skilled nursing facility Living Arrangements: Spouse/significant other                               Additional Comments: Per RN (Troyce)--husband reports that he had paid care for her at home 20 hours a day, he still works. She is total care for BADLs except for she does feed herself post set up. She stays in bed all day and has not walked for several years      Prior Functioning/Environment          Comments: See additional comments above    OT Diagnosis: Cognitive deficits         OT Goals(Current goals can be found in the care plan section) Acute  Rehab OT Goals Patient Stated Goal: wanting to go home  OT Frequency:                End of Session Nurse Communication:  (I removed blanket due to being soiled with feces and pt would not let me put another blanket back on her due to in her mind she was trying to leave)  Activity Tolerance: Treatment limited secondary to agitation Patient left: in bed;with call bell/phone within reach;with bed alarm set   Time: 7628-3151 OT Time Calculation (min): 15 min Charges:  OT General Charges $OT Visit: 1 Procedure OT Evaluation $Initial OT Evaluation Tier I: 1 Procedure  Almon Register [phone] 08/05/2015, 10:23 AM

## 2015-08-05 NOTE — Plan of Care (Signed)
Problem: Phase I Progression Outcomes Goal: OOB as tolerated unless otherwise ordered Outcome: Not Progressing Pt's husband says that pt has not been out of bed for a year

## 2015-08-05 NOTE — Care Management Note (Signed)
**Note De-Identified  Obfuscation** Case Management Note  Patient Details  Name: Tara Huff MRN: [id number] Date of Birth: 1934/04/20  Subjective/Objective:   Pt lives with spouse and has caregiver that spouse pays privately.  Spouse reports that pt has been basically bedbound for years, is on the waiting list for [employer], and [employer].  Pt was receiving services from Dr Reymundo Poll - Dr Fredderick Phenix is now affiliated with [employer] and spouse will need to contact them to continue services.  Pt has not been on insulin - discussed home health nursing to teach caregiver if pt returns home.           Expected Discharge Plan:  [place]  Discharge planning Services  CM Consult  Status of Service:  In process, will continue to follow  Girard Cooter, RN 08/05/2015, 3:02 PM

## 2015-08-05 NOTE — Progress Notes (Signed)
Inpatient Diabetes Program Recommendations  AACE/ADA: New Consensus Statement on Inpatient Glycemic Control (2015)  Target Ranges:  Prepandial:   less than 140 mg/dL      Peak postprandial:   less than 180 mg/dL (1-2 hours)      Critically ill patients:  140 - 180 mg/dL   Review of Glycemic Control  Diabetes history: DM 2 Outpatient Diabetes medications: Novolog Correction scale (reported as not taking in med rec)   Consult from RN: "Husband pays for out of pocket Nurse techs to stay with pt. She cannot administer Insulin or check her sugar. Her husband wants the " nurses" to do it. He is 30 & still works. This definitely poses a problem."  Went to see patient to inquire about home care. Patient seems very confused jumping from one subject to another incoherently. I could not follow conversation. If husband would like RN's to administer insulin and DM care would have to check with CM and insurance coverage. Most of the time [place] will not cover just for DM management. Whoever stays with patient will have to learn and administer insulin. If it is simpler, we can discharge patient on once daily basal injection for the husband or caregiver to administer. Patient on Levemir 10 units Daily here inpatient.  Thanks,  Tama Headings RN, MSN, [facility] Inpatient Diabetes Coordinator Team Pager [phone] (8a-5p)

## 2015-08-05 NOTE — Progress Notes (Signed)
Subjective:  Pt pleasantly confused, not oriented. Remains in Afib with CVR, DNR  Objective:  Temp:  [98.1 F (36.7 C)-98.5 F (36.9 C)] 98.3 F (36.8 C) (09/26 0400) Pulse Rate:  [44-93] 44 (09/26 0400) Resp:  [13-18] 16 (09/26 0400) BP: (90-109)/(39-66) 102/66 mmHg (09/26 0400) SpO2:  [92 %-98 %] 98 % (09/26 0400) Weight change:   Intake/Output from previous day: 09/25 0701 - 09/26 0700 In: 1875.4 [I.V.:1875.4] Out: 200 [Stool:200]  Intake/Output from this shift:    Physical Exam: General appearance: alert, no distress and slowed mentation Neck: no adenopathy, no carotid bruit, no JVD, supple, symmetrical, trachea midline and thyroid not enlarged, symmetric, no tenderness/mass/nodules Lungs: clear to auscultation bilaterally Heart: irregularly irregular rhythm Extremities: extremities normal, atraumatic, no cyanosis or edema Neurologic: Mental status: Alert, oriented, thought content appropriate  Lab Results: Results for orders placed or performed during the hospital encounter of 08/02/15 (from the past 48 hour(s))  Glucose, capillary     Status: Abnormal   Collection Time: 08/03/15  8:40 AM  Result Value Ref Range   Glucose-Capillary 132 (H) 65 - 99 mg/dL  Glucose, capillary     Status: Abnormal   Collection Time: 08/03/15  9:50 AM  Result Value Ref Range   Glucose-Capillary 147 (H) 65 - 99 mg/dL  Glucose, capillary     Status: Abnormal   Collection Time: 08/03/15 11:01 AM  Result Value Ref Range   Glucose-Capillary 145 (H) 65 - 99 mg/dL  Troponin I     Status: None   Collection Time: 08/03/15 12:15 PM  Result Value Ref Range   Troponin I <0.03 <0.031 ng/mL    Comment:        NO INDICATION OF MYOCARDIAL INJURY.   Digoxin level     Status: None   Collection Time: 08/03/15 12:15 PM  Result Value Ref Range   Digoxin Level 1.0 0.8 - 2.0 ng/mL  Glucose, capillary     Status: Abnormal   Collection Time: 08/03/15  1:18 PM  Result Value Ref Range   Glucose-Capillary 149 (H) 65 - 99 mg/dL  Glucose, capillary     Status: Abnormal   Collection Time: 08/03/15  5:58 PM  Result Value Ref Range   Glucose-Capillary 213 (H) 65 - 99 mg/dL  Glucose, capillary     Status: Abnormal   Collection Time: 08/03/15  7:47 PM  Result Value Ref Range   Glucose-Capillary 161 (H) 65 - 99 mg/dL  Glucose, capillary     Status: Abnormal   Collection Time: 08/04/15 12:46 AM  Result Value Ref Range   Glucose-Capillary 137 (H) 65 - 99 mg/dL  CBC     Status: Abnormal   Collection Time: 08/04/15  3:12 AM  Result Value Ref Range   WBC 8.4 4.0 - 10.5 K/uL   RBC 4.04 3.87 - 5.11 MIL/uL   Hemoglobin 12.9 12.0 - 15.0 g/dL    Comment: REPEATED TO VERIFY DELTA CHECK NOTED RESULT CALLED TO, READ BACK BY AND VERIFIED WITH: AMANDA PETTIFORD,RN AT 0500 08/04/15. K.PAXTON    HCT 36.4 36.0 - 46.0 %   MCV 90.1 78.0 - 100.0 fL   MCH 31.9 26.0 - 34.0 pg   MCHC 35.4 30.0 - 36.0 g/dL   RDW 13.1 11.5 - 15.5 %   Platelets 127 (L) 150 - 400 K/uL  Basic metabolic panel     Status: Abnormal   Collection Time: 08/04/15  3:12 AM  Result Value Ref Range   Sodium 139  135 - 145 mmol/L    Comment: DELTA CHECK NOTED   Potassium 3.3 (L) 3.5 - 5.1 mmol/L   Chloride 104 101 - 111 mmol/L   CO2 27 22 - 32 mmol/L   Glucose, Bld 116 (H) 65 - 99 mg/dL   BUN 39 (H) 6 - 20 mg/dL   Creatinine, Ser 1.18 (H) 0.44 - 1.00 mg/dL   Calcium 8.2 (L) 8.9 - 10.3 mg/dL   GFR calc non Af Amer 43 (L) >60 mL/min   GFR calc Af Amer 50 (L) >60 mL/min    Comment: (NOTE) The eGFR has been calculated using the CKD EPI equation. This calculation has not been validated in all clinical situations. eGFR's persistently <60 mL/min signify possible Chronic Kidney Disease.    Anion gap 8 5 - 15  Glucose, capillary     Status: Abnormal   Collection Time: 08/04/15  5:36 AM  Result Value Ref Range   Glucose-Capillary 123 (H) 65 - 99 mg/dL  Glucose, capillary     Status: Abnormal   Collection Time: 08/04/15   7:54 AM  Result Value Ref Range   Glucose-Capillary 108 (H) 65 - 99 mg/dL  Glucose, capillary     Status: Abnormal   Collection Time: 08/04/15 12:00 PM  Result Value Ref Range   Glucose-Capillary 124 (H) 65 - 99 mg/dL  C difficile quick scan w PCR reflex     Status: Abnormal   Collection Time: 08/04/15  2:56 PM  Result Value Ref Range   C Diff antigen POSITIVE (A) NEGATIVE   C Diff toxin NEGATIVE NEGATIVE   C Diff interpretation      C. difficile present, but toxin not detected. This indicates colonization. In most cases, this does not require treatment. If patient has signs and symptoms consistent with colitis, consider treatment.  Glucose, capillary     Status: Abnormal   Collection Time: 08/04/15  5:11 PM  Result Value Ref Range   Glucose-Capillary 166 (H) 65 - 99 mg/dL  Glucose, capillary     Status: Abnormal   Collection Time: 08/04/15  8:42 PM  Result Value Ref Range   Glucose-Capillary 161 (H) 65 - 99 mg/dL  Glucose, capillary     Status: Abnormal   Collection Time: 08/04/15 11:27 PM  Result Value Ref Range   Glucose-Capillary 167 (H) 65 - 99 mg/dL  CBC     Status: Abnormal   Collection Time: 08/05/15  3:09 AM  Result Value Ref Range   WBC 5.5 4.0 - 10.5 K/uL   RBC 3.84 (L) 3.87 - 5.11 MIL/uL   Hemoglobin 12.0 12.0 - 15.0 g/dL   HCT 34.6 (L) 36.0 - 46.0 %   MCV 90.1 78.0 - 100.0 fL   MCH 31.3 26.0 - 34.0 pg   MCHC 34.7 30.0 - 36.0 g/dL   RDW 13.3 11.5 - 15.5 %   Platelets 105 (L) 150 - 400 K/uL    Comment: SPECIMEN CHECKED FOR CLOTS REPEATED TO VERIFY PLATELET COUNT CONFIRMED BY SMEAR   Basic metabolic panel     Status: Abnormal   Collection Time: 08/05/15  3:09 AM  Result Value Ref Range   Sodium 140 135 - 145 mmol/L   Potassium 3.2 (L) 3.5 - 5.1 mmol/L   Chloride 109 101 - 111 mmol/L   CO2 26 22 - 32 mmol/L   Glucose, Bld 155 (H) 65 - 99 mg/dL   BUN 21 (H) 6 - 20 mg/dL   Creatinine, Ser 1.04 (H) 0.44 -  1.00 mg/dL   Calcium 8.5 (L) 8.9 - 10.3 mg/dL   GFR  calc non Af Amer 50 (L) >60 mL/min   GFR calc Af Amer 58 (L) >60 mL/min    Comment: (NOTE) The eGFR has been calculated using the CKD EPI equation. This calculation has not been validated in all clinical situations. eGFR's persistently <60 mL/min signify possible Chronic Kidney Disease.    Anion gap 5 5 - 15  Glucose, capillary     Status: Abnormal   Collection Time: 08/05/15  4:31 AM  Result Value Ref Range   Glucose-Capillary 146 (H) 65 - 99 mg/dL   Comment 1 Notify RN    Comment 2 Document in Chart     Imaging: Imaging results have been reviewed  Assessment/Plan:   1. Principal Problem: 2.   Diabetes mellitus with hyperosmolarity 3. Active Problems: 4.   UTI (lower urinary tract infection) 5.   Atrial fibrillation 6.   Acute hypernatremia 7.   Prerenal azotemia 8.   Elevated troponin 9.   Abnormal EKG 10.   AKI (acute kidney injury) 11.   Atrial fibrillation, unspecified 12.   Hyperglycemia 13.   Acute encephalopathy 14.   Time Spent Directly with Patient:  20  minutes  Length of Stay:  LOS: 3 days   Pt admitted with confusion. Had UTI and DM OOC. We were asked to see for Afib. She is an OP of Traci Turner's. She is a DNR. Pt denies Sx. EKG Afib with CVR, RBBB with ST depression. Enz neg. LV nl. No an oral anticoagulation candidate or an intervention candidate. No further recs. Suggest SNF. Cont current Rx. Call if we can be of further assistance. Will S/O for now.   Quay Burow 08/05/2015, 7:55 AM

## 2015-08-05 NOTE — Progress Notes (Signed)
Pt transferred per bed with all belongings to [place] has her glasses with her bed 02 per Nurse tech

## 2015-08-05 NOTE — Progress Notes (Addendum)
Initial Nutrition Assessment  DOCUMENTATION CODES:   Not applicable  INTERVENTION:   Continue Ensure Enlive po BID, each supplement provides 350 kcal and 20 grams of protein  NUTRITION DIAGNOSIS:   Inadequate oral intake related to poor appetite (confusion) as evidenced by meal completion < 25%  GOAL:   Patient will meet greater than or equal to 90% of their needs  MONITOR:   PO intake, Supplement acceptance, Labs, Weight trends, I & O's  REASON FOR ASSESSMENT:   Malnutrition Screening Tool, Low Braden  ASSESSMENT:   81 yo Female with PMH of atrial fibrillation, hypertension, previous MI, depression, was brought in ED by EMS for weakness and confusion. She was found to have atrial fibrillation with mild RVR. She was noted to have a urinary tract infection. She was also noted to be profoundly hyperglycemic and was started on an insulin infusion. She was hospitalized for further management.  Pt a bit confused upon RD visit.  Reports she's eating well.  PO intake variable at 15-80% per flowsheet records.  Reveals she consumes 2 meals per day and drinks Ensure supplements at home.  Ensure Enlive orders in place.  Low braden score places patient at risk for skin breakdown.  RD unable to complete Nutrition Focused Physical Exam at this time.  Diet Order:  Diet Carb Modified Fluid consistency:: Thin; Room service appropriate?: Yes  Skin:  Reviewed, no issues  Last BM:  9/26  Height:   Ht Readings from Last 1 Encounters:  08/02/15 5\' 6"  (1.676 m)    Weight:   Wt Readings from Last 1 Encounters:  08/02/15 151 lb 14.4 oz (68.9 kg)    Ideal Body Weight:  59 kg  BMI:  Body mass index is 24.53 kg/(m^2).  Estimated Nutritional Needs:   Kcal:  1600-1800  Protein:  80-90 gm  Fluid:  1.6-1.8 L  EDUCATION NEEDS:   No education needs identified at this time  Arthur Holms, RD, LDN Pager #: [phone] After-Hours Pager #: [phone]

## 2015-08-05 NOTE — Progress Notes (Signed)
Called report to [facility] on [address]

## 2015-08-06 DIAGNOSIS — A047 Enterocolitis due to Clostridium difficile: Secondary | ICD-10-CM

## 2015-08-06 LAB — BASIC METABOLIC PANEL
Anion gap: 7 (ref 5–15)
BUN: 11 mg/dL (ref 6–20)
CHLORIDE: 108 mmol/L (ref 101–111)
CO2: 25 mmol/L (ref 22–32)
CREATININE: 0.91 mg/dL (ref 0.44–1.00)
Calcium: 8.5 mg/dL — ABNORMAL LOW (ref 8.9–10.3)
GFR, EST NON AFRICAN AMERICAN: 59 mL/min — AB (ref >60)
Glucose, Bld: 216 mg/dL — ABNORMAL HIGH (ref 65–99)
Potassium: 3.7 mmol/L (ref 3.5–5.1)
SODIUM: 140 mmol/L (ref 135–145)

## 2015-08-06 LAB — CBC
HCT: 35.8 % — ABNORMAL LOW (ref 36.0–46.0)
HEMOGLOBIN: 12.4 g/dL (ref 12.0–15.0)
MCH: 31.7 pg (ref 26.0–34.0)
MCHC: 34.6 g/dL (ref 30.0–36.0)
MCV: 91.6 fL (ref 78.0–100.0)
PLATELETS: 98 10*3/uL — AB (ref 150–400)
RBC: 3.91 MIL/uL (ref 3.87–5.11)
RDW: 13.6 % (ref 11.5–15.5)
WBC: 5.3 10*3/uL (ref 4.0–10.5)

## 2015-08-06 LAB — GLUCOSE, CAPILLARY
GLUCOSE-CAPILLARY: 164 mg/dL — AB (ref 65–99)
GLUCOSE-CAPILLARY: 164 mg/dL — AB (ref 65–99)
GLUCOSE-CAPILLARY: 182 mg/dL — AB (ref 65–99)
Glucose-Capillary: 201 mg/dL — ABNORMAL HIGH (ref 65–99)

## 2015-08-06 MED ORDER — SACCHAROMYCES BOULARDII 250 MG PO CAPS
250.0000 mg | ORAL_CAPSULE | Freq: Two times a day (BID) | ORAL | Status: DC
  Administered 2015-08-07 – 2015-08-08 (×2): 250 mg via ORAL
  Filled 2015-08-06 (×3): qty 1

## 2015-08-06 MED ORDER — PREGABALIN 50 MG PO CAPS
50.0000 mg | ORAL_CAPSULE | Freq: Two times a day (BID) | ORAL | Status: DC
  Administered 2015-08-06 – 2015-08-08 (×3): 50 mg via ORAL
  Filled 2015-08-06 (×4): qty 1

## 2015-08-06 MED ORDER — CEFPODOXIME PROXETIL 200 MG PO TABS
200.0000 mg | ORAL_TABLET | Freq: Two times a day (BID) | ORAL | Status: DC
  Administered 2015-08-06: 200 mg via ORAL
  Filled 2015-08-06 (×4): qty 1

## 2015-08-06 MED ORDER — ATORVASTATIN CALCIUM 20 MG PO TABS
20.0000 mg | ORAL_TABLET | Freq: Every evening | ORAL | Status: DC
  Administered 2015-08-06 – 2015-08-08 (×2): 20 mg via ORAL
  Filled 2015-08-06 (×2): qty 1

## 2015-08-06 MED ORDER — BUPROPION HCL 75 MG PO TABS
75.0000 mg | ORAL_TABLET | Freq: Two times a day (BID) | ORAL | Status: DC
  Administered 2015-08-06 – 2015-08-08 (×3): 75 mg via ORAL
  Filled 2015-08-06 (×7): qty 1

## 2015-08-06 NOTE — Progress Notes (Signed)
PT Cancellation Note  Patient Details Name: Tara Huff MRN: [id number] DOB: 02/11/1934   Cancelled Treatment:    Reason Eval/Treat Not Completed: Fatigue/lethargy limiting ability to participate (pt was sleeping, when aroused she stated she wants to be left alone, refused PT. Will follow. )   [name] 08/06/2015, 1:30 PM [phone]

## 2015-08-06 NOTE — Clinical Social Work Note (Signed)
CSW received consult regarding skilled nursing facility placement for patient. Patient's husband Ellington Cornia contacted [phone]) by phone for discussion regarding discharge. Mr. Daponte plans to take patient home as his wife has not cooperated in the past with rehab while in a nursing facility. CSW informed that a lady named Tamela Oddi comes to the home 5/6 times per week and has someone else come 1/2 times per week to do strength work with his wife in her bed. Mr. Tomasello explained that Tamela Oddi has been working with his wife for about a year and is tough with her, and does not allow her to refuse to work with them, although his wife does complain, per Mr. Robinson.  CSW will continue to follow patient until she is discharged home.  Crawford Givens, MSW, LCSW Licensed Clinical Social Worker [place] [phone]

## 2015-08-06 NOTE — Progress Notes (Signed)
Speech Language Pathology Treatment: Dysphagia  Patient Details Name: Tara Huff MRN: [id number] DOB: [birth year] Today's Date: 08/06/2015 Time: 3524-8185 SLP Time Calculation (min) (ACUTE ONLY): 9 min  Assessment / Plan / Recommendation Clinical Impression  Pt was agreeable to straw sips of thin liquid, but declined any solid POs offered. Skilled observation showed oropharyngeal swallow function that appeared to be within gross functional limits. Swallow occurred swiftly and without overt evidence of aspiration. She has been tolerating current diet with lung sounds unchanged and no fever. No further acute needs identified - SLP to sign off.   HPI Other Pertinent Information: 81 y.o. female with below past medical history atrial fibrillation, Hypertension, MI, anxiety,depression, hypercholesterolemia, shoulder pain, type 2 diabetes mellitus, CHF, OSA brought to the emergency department after she was found to be weak, malaised and confused when evaluated by home health nursing earlier in the day. Per MD note pt lives at home with her husband, who apparently has a history of dementia. CXR left base atelectasis or infiltrate. BSE 11/16/13 was WFL's (dried lips,cracked), Dys 3 texture and thin liquids recommended.   Pertinent Vitals Pain Assessment: No/denies pain  SLP Plan  All goals met    Recommendations Diet recommendations: Regular;Thin liquid Liquids provided via: Cup;Straw Medication Administration: Whole meds with liquid Compensations: Slow rate;Small sips/bites       Oral Care Recommendations: Oral care BID Follow up Recommendations: None Plan: All goals met    Germain Osgood, M.A. CCC-SLP [phone]  Germain Osgood 08/06/2015, 2:33 PM

## 2015-08-06 NOTE — Progress Notes (Signed)
PT Cancellation Note  Patient Details Name: Tara Huff MRN: [id number] DOB: Jan 05, 1934   Cancelled Treatment:    Reason Eval/Treat Not Completed: Fatigue/lethargy limiting ability to participate (pt stated she needs to rest right now and will try to get up with PT later. Will follow. )   [name] 08/06/2015, 11:01 AM [phone]

## 2015-08-06 NOTE — Progress Notes (Signed)
Pt currently refusing to take PO meds. States "she just wants to sleep right now." Will attempt again later.   Tyna Jaksch, RN

## 2015-08-06 NOTE — Progress Notes (Signed)
[facility] PROGRESS NOTE  Tara Huff TDS:[id number] DOB: 09-Mar-1934 DOA: 08/02/2015  PCP: Reginia Naas, MD  Brief HPI: 81 year old Caucasian female with a past medical history of atrial fibrillation, hypertension, previous MI, depression, was brought into the emergency department by EMS for weakness and confusion. She was found to have atrial fibrillation with mild RVR. She was noted to have a urinary tract infection. She was also noted to be profoundly hyperglycemic and was started on an insulin infusion. She was hospitalized for further management. Patient's blood sugars improved. She was transitioned to Lantus. She was initiated on antibiotics for her UTI. Mental status slowly improved.  Past medical history:  Past Medical History  Diagnosis Date  . Hypertension   . Atrial fibrillation   . MI (myocardial infarction)   . Anxiety   . Hypercholesterolemia   . Shoulder pain   . Sleep apnea     intolerant to CPAP  . Type 2 diabetes mellitus   . OSA (obstructive sleep apnea)   . Hypertensive heart disease   . Morbid obesity   . Pyogenic granuloma   . Cancer     cervical  . Depression   . Pneumonia   . CHF (congestive heart failure)   . Cardiac arrest 11/14/2013  . Complete heart block 11/14/2013  . OSTEOMYELITIS 04/28/2010    L great Toe      . Carcinoma of endometrium 11/20/2013    Consultants: Cardiology  Procedures:  Echocardiogram Study Conclusions - Left ventricle: The cavity size was normal. Systolic function wasnormal. The estimated ejection fraction was in the range of 55%to 60%. Regional wall motion abnormalities cannot be excluded.The study was not technically sufficient to allow evaluation ofLV diastolic dysfunction due to atrial fibrillation. - Aortic valve: Trileaflet; mildly thickened, mildly calcifiedleaflets. There was no regurgitation. - Aortic root: The aortic root was normal in size. - Mitral valve: Structurally normal valve. There was  noregurgitation. - Left atrium: The atrium was normal in size. - Right ventricle: Systolic function was normal. - Tricuspid valve: There was trivial regurgitation. - Pulmonary arteries: Systolic pressure was within the normalrange. - Inferior vena cava: The vessel was normal in size. - Pericardium, extracardiac: There was no pericardial effusion.   Antibiotics: Ceftriaxone till 9/27 Vantin 9/27  Subjective: Patient remains pleasantly confused. Apparently this is her baseline.  Objective: Vital Signs  Filed Vitals:   08/05/15 1207 08/05/15 1709 08/05/15 2306 08/06/15 0438  BP: 99/64 112/58 126/71 131/75  Pulse: 102 97 90 94  Temp: 98 F (36.7 C) 97.8 F (36.6 C) 98 F (36.7 C) 98.8 F (37.1 C)  TempSrc: Oral Oral Oral Oral  Resp: 15 18 17 17   Height:      Weight:      SpO2: 98% 98% 97% 97%    Intake/Output Summary (Last 24 hours) at 08/06/15 0827 Last data filed at 08/06/15 0600  Gross per 24 hour  Intake   3284 ml  Output    400 ml  Net   2884 ml   Filed Weights   08/02/15 1651 08/02/15 2051  Weight: 72.576 kg (160 lb) 68.9 kg (151 lb 14.4 oz)    General appearance: Alert. Confused. In no distress. Resp: clear to auscultation bilaterally Cardio: S1, S2 is irregularly irregular. No S3 s4. No rubs. Systolic murmur appreciated over the precordium. No pedal edema.Marland Kitchen GI: soft, non-tender; bowel sounds normal; no masses,  no organomegaly Neurologic: Awake, alert. She remains confused. No facial asymmetry. Motor strength is equal bilateral  upper and lower extremity. Generalized weakness is appreciated.  Lab Results:  Basic Metabolic Panel:  Recent Labs Lab 08/02/15 2300 08/03/15 0105 08/03/15 0626 08/04/15 0312 08/05/15 0309 08/06/15 0445  NA  --  148* 148* 139 140 140  K  --  3.3* 3.6 3.3* 3.2* 3.7  CL  --  112* 113* 104 109 108  CO2  --  26 28 27 26 25   GLUCOSE  --  315* 158* 116* 155* 216*  BUN  --  71* 64* 39* 21* 11  CREATININE  --  1.75* 1.50*  1.18* 1.04* 0.91  CALCIUM  --  9.2 8.8* 8.2* 8.5* 8.5*  MG  --  2.2  --   --  1.7  --   PHOS 1.2*  --   --   --   --   --    CBC:  Recent Labs Lab 08/02/15 1702 08/02/15 1728 08/04/15 0312 08/05/15 0309 08/06/15 0445  WBC 8.7  --  8.4 5.5 5.3  NEUTROABS 6.4  --   --   --   --   HGB 16.3* 17.3* 12.9 12.0 12.4  HCT 48.2* 51.0* 36.4 34.6* 35.8*  MCV 93.8  --  90.1 90.1 91.6  PLT 189  --  127* 105* 98*   Cardiac Enzymes:  Recent Labs Lab 08/02/15 1702 08/02/15 2330 08/03/15 0626 08/03/15 1215  TROPONINI 0.04* 0.03 0.03 <0.03   CBG:  Recent Labs Lab 08/05/15 0431 08/05/15 0816 08/05/15 1204 08/05/15 1649 08/05/15 2131  GLUCAP 146* 124* 213* 187* 265*    Recent Results (from the past 240 hour(s))  Urine culture     Status: None   Collection Time: 08/02/15  5:56 PM  Result Value Ref Range Status   Specimen Description URINE, CATHETERIZED  Final   Special Requests ADDED ON AT 1822 08/02/15  Final   Culture >=100,000 COLONIES/mL ESCHERICHIA COLI  Final   Report Status 08/05/2015 FINAL  Final   Organism ID, Bacteria ESCHERICHIA COLI  Final      Susceptibility   Escherichia coli - MIC*    AMPICILLIN >=32 RESISTANT Resistant     CEFAZOLIN 32 INTERMEDIATE Intermediate     CEFTRIAXONE <=1 SENSITIVE Sensitive     CIPROFLOXACIN >=4 RESISTANT Resistant     GENTAMICIN <=1 SENSITIVE Sensitive     IMIPENEM <=0.25 SENSITIVE Sensitive     NITROFURANTOIN <=16 SENSITIVE Sensitive     TRIMETH/SULFA <=20 SENSITIVE Sensitive     AMPICILLIN/SULBACTAM 16 INTERMEDIATE Intermediate     PIP/TAZO 8 SENSITIVE Sensitive     * >=100,000 COLONIES/mL ESCHERICHIA COLI  MRSA PCR Screening     Status: Abnormal   Collection Time: 08/02/15  9:14 PM  Result Value Ref Range Status   MRSA by PCR POSITIVE (A) NEGATIVE Final    Comment:        The GeneXpert MRSA Assay (FDA approved for NASAL specimens only), is one component of a comprehensive MRSA colonization surveillance program. It is  not intended to diagnose MRSA infection nor to guide or monitor treatment for MRSA infections. RESULT CALLED TO, READ BACK BY AND VERIFIED WITH: B REAP RN 2324 08/02/15 A BROWNING   C difficile quick scan w PCR reflex     Status: Abnormal   Collection Time: 08/04/15  2:56 PM  Result Value Ref Range Status   C Diff antigen POSITIVE (A) NEGATIVE Final   C Diff toxin NEGATIVE NEGATIVE Final   C Diff interpretation   Final    C. difficile  present, but toxin not detected. This indicates colonization. In most cases, this does not require treatment. If patient has signs and symptoms consistent with colitis, consider treatment.      Studies/Results: No results found.  Medications:  Scheduled: . cefpodoxime  200 mg Oral Q12H  . Chlorhexidine Gluconate Cloth  6 each Topical Q0600  . enoxaparin (LOVENOX) injection  40 mg Subcutaneous Q24H  . feeding supplement (ENSURE ENLIVE)  237 mL Oral BID BM  . insulin aspart  0-15 Units Subcutaneous TID WC  . insulin detemir  10 Units Subcutaneous Daily  . metroNIDAZOLE  500 mg Oral 3 times per day  . mupirocin ointment  1 application Nasal BID  . QUEtiapine  12.5 mg Oral QHS   Continuous: . sodium chloride 75 mL/hr at 08/06/15 0646   PRN:  Assessment/Plan:  Principal Problem:   Diabetes mellitus with hyperosmolarity Active Problems:   UTI (lower urinary tract infection)   Atrial fibrillation   Acute hypernatremia   Prerenal azotemia   Elevated troponin   Abnormal EKG   AKI (acute kidney injury)   Atrial fibrillation, unspecified   Hyperglycemia   Acute encephalopathy   C. difficile diarrhea    Acute encephalopathy Multifactorial including UTI, hyperglycemia, metabolic derangements. No focal deficits noted. Reorient daily. According to nursing staff who spoke to the patient's husband, this is her baseline. She apparently hasn't ambulated in about a year. She has around-the-clock nursing care at home. mmental status could be at  baseline.  Loose stools/possible C. difficile Stool was positive for C. difficile antigen but negative for toxin. This suggests colonization. However, patient continued to have profuse diarrhea. She was started on Flagyl. Diarrhea persists. Await improvement.  Nonketotic hyperglycemia Patient was placed on an insulin infusion at the time of admission. Blood sugars are much better. HbA1c is pending. She was transitioned to subcutaneous insulin. Blood sugars are reasonably well controlled. Unclear what she takes for diabetes at home. Home medication list does not mention any glucose lowering agents. This could be new onset diabetes.  Urinary tract infection with Escherichia coli Sensitivities noted. Change to oral Vantin.  Thrombocytopenia Platelet counts have dropped significantly since admission. We will hold her heparin. Repeat counts tomorrow morning. No overt bleeding identified.  Dehydration with hypernatremia/hypokalemia Seems to be improving. Sodium level is better. BUN and creatinine improving. Potassium is normal. Magnesium was normal.  Acute renal failure Most likely prerenal in etiology. Renal function has improved with IV fluids. Monitor urine output.  Atrial fibrillation with mild RVR Heart rate is reasonably well controlled. Blood pressure is stable. She is asymptomatic. She is not requiring any rate control medications at this time. She was on digoxin at home, which has been held.  Apparently, she has refused anticoagulation in the past. Home medication list did suggest that she was on Eliquis, but unclear if she has taken that medication. Cardiology was following. Recommendation is to take aspirin.  Abnormal EKG Diffuse ST depression noted on EKG. Seen by cardiology. Troponin was mildly elevated but subsequent levels and normal. Patient denies any chest pain. She was not considered a candidate for cardiac catheterization. Echocardiogram report as above. Wall motion  abnormalities cannot be ruled out, but her systolic function is normal.  DVT Prophylaxis: SCDs today. Stopped Lovenox due to drop in platelet count.    Code Status: DO NOT RESUSCITATE  Family Communication: No family at bedside. Tried calling her husband with no success.  Disposition Plan: await improvement in diarrhea. Will likely need to  be placed. We'll consult Education officer, museum.    LOS: 4 days   [facility] Pager [phone] 08/06/2015, 8:27 AM  If 7PM-7AM, please contact night-coverage at [email or web address], password [facility]

## 2015-08-07 DIAGNOSIS — E870 Hyperosmolality and hypernatremia: Secondary | ICD-10-CM

## 2015-08-07 LAB — BASIC METABOLIC PANEL
ANION GAP: 9 (ref 5–15)
BUN: 9 mg/dL (ref 6–20)
CO2: 19 mmol/L — ABNORMAL LOW (ref 22–32)
CREATININE: 1 mg/dL (ref 0.44–1.00)
Calcium: 8.2 mg/dL — ABNORMAL LOW (ref 8.9–10.3)
Chloride: 109 mmol/L (ref 101–111)
GFR calc non Af Amer: 53 mL/min — ABNORMAL LOW (ref >60)
GLUCOSE: 155 mg/dL — AB (ref 65–99)
POTASSIUM: 3.4 mmol/L — AB (ref 3.5–5.1)
Sodium: 137 mmol/L (ref 135–145)

## 2015-08-07 LAB — GLUCOSE, CAPILLARY
GLUCOSE-CAPILLARY: 176 mg/dL — AB (ref 65–99)
GLUCOSE-CAPILLARY: 116 mg/dL — AB (ref 65–99)
GLUCOSE-CAPILLARY: 82 mg/dL (ref 65–99)
Glucose-Capillary: 150 mg/dL — ABNORMAL HIGH (ref 65–99)

## 2015-08-07 LAB — CBC
HEMATOCRIT: 40.1 % (ref 36.0–46.0)
Hemoglobin: 13.7 g/dL (ref 12.0–15.0)
MCH: 31.6 pg (ref 26.0–34.0)
MCHC: 34.2 g/dL (ref 30.0–36.0)
MCV: 92.6 fL (ref 78.0–100.0)
PLATELETS: 92 10*3/uL — AB (ref 150–400)
RBC: 4.33 MIL/uL (ref 3.87–5.11)
RDW: 14.1 % (ref 11.5–15.5)
WBC: 4.4 10*3/uL (ref 4.0–10.5)

## 2015-08-07 MED ORDER — POTASSIUM CHLORIDE CRYS ER 20 MEQ PO TBCR
40.0000 meq | EXTENDED_RELEASE_TABLET | Freq: Once | ORAL | Status: DC
Start: 2015-08-07 — End: 2015-08-08
  Filled 2015-08-07: qty 2

## 2015-08-07 NOTE — Progress Notes (Signed)
PT Cancellation Note  Patient Details Name: Tara Huff MRN: [id number] DOB: 10-02-1934   Cancelled Treatment:    Reason Eval/Treat Not Completed: Other (comment)   Discussed status with nursing, who informed me she's unable to work with PT;   Will follow up later today as time allows;  Otherwise, will follow up for PT tomorrow;   Thank you,  Roney Marion, PT  Acute Rehabilitation Services Pager [phone] Office [phone]     Roney Marion [facility] 08/07/2015, 12:01 PM

## 2015-08-07 NOTE — Clinical Social Work Note (Addendum)
CSW talked with patient's husband Keera Altidor. He is requesting ambulance transport when patient is ready for d/c home and husband advised that non-emergent ambulance ([facility]) can transport patient home.  Mr. Moravek will be going out of town on Saturday, 08/10/15 and will be gone for a week. Per husband, the nursing assistant Tamela Oddi will be caring for patient.  Mr. Dysart will get back with CSW to provide Tamela Oddi' phone number.  Received call from Mr. Vandruff at 2:55 pm with Roger Kill phone number [phone].   Crawford Givens, MSW, LCSW Licensed Clinical Social Worker [place] [phone]

## 2015-08-07 NOTE — Progress Notes (Signed)
Patient refused all po meds except seroquel by spiting them out.

## 2015-08-07 NOTE — Progress Notes (Signed)
Dr. Hartford Poli notified that patient is currently refusing all PO medications. He stated to continue to try the potassium and flagyl pills as they are the most important right now. Will continue to monitor.  Joellen [place], RN.

## 2015-08-07 NOTE — Care Management Important Message (Signed)
Important Message  Patient Details  Name: Tara Huff MRN: [id number] Date of Birth: 21-Apr-1934   Medicare Important Message Given:  Yes-second notification given    Adron Bene, RN 08/07/2015, 12:03 PM

## 2015-08-07 NOTE — Progress Notes (Signed)
Physical Therapy Treatment Patient Details Name: Tara Huff MRN: [id number] DOB: [birth year] Today's Date: 08/07/2015    History of Present Illness Patient is a 81 yo female admitted 08/02/15 with AMS, weakness, hyperglycemia, Afib with RVR, UTI.   PMH:  Afib, HTN, MI, anxiety, depression, DM, CHF, pacemaker, shoulder pain    PT Comments    Not participating in mobility activities today;   Noted pt's husband is refusing SNF and he has arranged for 24 hour assist at home; Unable to get information re: home equipment from patient; Recommendations for equipment are below   Follow Up Recommendations  Home health PT;Supervision/Assistance - 24 hour     Equipment Recommendations  Rolling walker with 5" wheels;3in1 (PT);Wheelchair (measurements PT);Wheelchair cushion (measurements PT);Hospital bed;Other (comment) (they may already have equipment)    Recommendations for Other Services       Precautions / Restrictions Precautions Precautions: Fall    Mobility  Bed Mobility Overal bed mobility: Needs Assistance;+2 for physical assistance Bed Mobility: Supine to Sit;Sit to Supine     Supine to sit: Max assist;+2 for physical assistance Sit to supine: Max assist;+2 for physical assistance   General bed mobility comments: Max assist with all bed mobility; Once up, pt pushing back to lie down; tolerated less than 5 minutes sitting up  Transfers                    Ambulation/Gait                 Stairs            Wheelchair Mobility    Modified Rankin (Stroke Patients Only)       Balance     Sitting balance-Leahy Scale: Zero Sitting balance - Comments: Patient pushing into extension.  Attempted to have patient shift trunk forward over hips.  Patient unable to focus on task at hand - internally and externally distracted.  Required mod-max assist to maintain static sitting balance.                            Cognition Arousal/Alertness:  Awake/alert (once sitting up) Behavior During Therapy: Flat affect;Agitated Overall Cognitive Status: Impaired/Different from baseline Area of Impairment: Orientation;Attention;Memory;Following commands;Safety/judgement;Awareness;Problem solving Orientation Level: Disoriented to;Place;Time;Situation Current Attention Level: Focused Memory: Decreased short-term memory Following Commands: Follows one step commands inconsistently Safety/Judgement: Decreased awareness of deficits;Decreased awareness of safety   Problem Solving: Slow processing;Difficulty sequencing;Requires verbal cues;Requires tactile cues General Comments: Eyes closed until sitting up on EOB; Did not speak during session    Exercises      General Comments        Pertinent Vitals/Pain Pain Assessment: No/denies pain    Home Living                      Prior Function            PT Goals (current goals can now be found in the care plan section) Acute Rehab PT Goals Patient Stated Goal: Did not state PT Goal Formulation: Patient unable to participate in goal setting Time For Goal Achievement: 08/18/15 Potential to Achieve Goals: Fair Progress towards PT goals: Not progressing toward goals - comment (decr participation today)    Frequency  Min 2X/week    PT Plan Discharge plan needs to be updated;Frequency needs to be updated    Co-evaluation  End of Session   Activity Tolerance: Treatment limited secondary to agitation (Limited by decreased cognition) Patient left: in bed;with call bell/phone within reach;with bed alarm set     Time: 1430-1445 PT Time Calculation (min) (ACUTE ONLY): 15 min  Charges:  $Therapeutic Activity: 8-22 mins                    G Codes:      Quin Hoop 08/07/2015, 4:58 PM  Roney Marion, [place] Pager [phone] Office [phone]

## 2015-08-07 NOTE — Progress Notes (Signed)
[facility] PROGRESS NOTE  Tara Huff UKG:[id number] DOB: Mar 23, 1934 DOA: 08/02/2015  PCP: Reginia Naas, MD  Brief HPI: 81 year old Caucasian female with a past medical history of atrial fibrillation, hypertension, previous MI, depression, was brought into the emergency department by EMS for weakness and confusion. She was found to have atrial fibrillation with mild RVR. She was noted to have a urinary tract infection. She was also noted to be profoundly hyperglycemic and was started on an insulin infusion. She was hospitalized for further management. Patient's blood sugars improved. She was transitioned to Lantus. She was initiated on antibiotics for her UTI. Mental status slowly improved.  Past medical history:  Past Medical History  Diagnosis Date  . Hypertension   . Atrial fibrillation   . MI (myocardial infarction)   . Anxiety   . Hypercholesterolemia   . Shoulder pain   . Sleep apnea     intolerant to CPAP  . Type 2 diabetes mellitus   . OSA (obstructive sleep apnea)   . Hypertensive heart disease   . Morbid obesity   . Pyogenic granuloma   . Cancer     cervical  . Depression   . Pneumonia   . CHF (congestive heart failure)   . Cardiac arrest 11/14/2013  . Complete heart block 11/14/2013  . OSTEOMYELITIS 04/28/2010    L great Toe      . Carcinoma of endometrium 11/20/2013    Consultants: Cardiology  Procedures:  Echocardiogram Study Conclusions - Left ventricle: The cavity size was normal. Systolic function wasnormal. The estimated ejection fraction was in the range of 55%to 60%. Regional wall motion abnormalities cannot be excluded.The study was not technically sufficient to allow evaluation ofLV diastolic dysfunction due to atrial fibrillation. - Aortic valve: Trileaflet; mildly thickened, mildly calcifiedleaflets. There was no regurgitation. - Aortic root: The aortic root was normal in size. - Mitral valve: Structurally normal valve. There was  noregurgitation. - Left atrium: The atrium was normal in size. - Right ventricle: Systolic function was normal. - Tricuspid valve: There was trivial regurgitation. - Pulmonary arteries: Systolic pressure was within the normalrange. - Inferior vena cava: The vessel was normal in size. - Pericardium, extracardiac: There was no pericardial effusion.   Antibiotics: Ceftriaxone till 9/27 Vantin 9/27  Subjective: Patient is pleasantly confused, denies any complaints. On Vantin, discontinued.  Objective: Vital Signs  Filed Vitals:   08/06/15 1743 08/06/15 2055 08/07/15 0615 08/07/15 0835  BP: 125/86  135/62 99/62  Pulse: 87 102 88 88  Temp: 98.2 F (36.8 C) 99.2 F (37.3 C) 99.5 F (37.5 C) 98.1 F (36.7 C)  TempSrc: Oral Axillary Axillary Oral  Resp: 16 14 20 20   Height:      Weight:  70.217 kg (154 lb 12.8 oz)    SpO2: 96% 95% 99% 98%    Intake/Output Summary (Last 24 hours) at 08/07/15 1118 Last data filed at 08/07/15 0900  Gross per 24 hour  Intake 1062.08 ml  Output      0 ml  Net 1062.08 ml   Filed Weights   08/02/15 1651 08/02/15 2051 08/06/15 2055  Weight: 72.576 kg (160 lb) 68.9 kg (151 lb 14.4 oz) 70.217 kg (154 lb 12.8 oz)    General appearance: Alert. Confused. In no distress. Resp: clear to auscultation bilaterally Cardio: S1, S2 is irregularly irregular. No S3 s4. No rubs. Systolic murmur appreciated over the precordium. No pedal edema.Marland Kitchen GI: soft, non-tender; bowel sounds normal; no masses,  no organomegaly Neurologic: Awake,  alert. She remains confused. No facial asymmetry. Motor strength is equal bilateral upper and lower extremity. Generalized weakness is appreciated.  Lab Results:  Basic Metabolic Panel:  Recent Labs Lab 08/02/15 2300 08/03/15 0105 08/03/15 0626 08/04/15 0312 08/05/15 0309 08/06/15 0445 08/07/15 0520  NA  --  148* 148* 139 140 140 137  K  --  3.3* 3.6 3.3* 3.2* 3.7 3.4*  CL  --  112* 113* 104 109 108 109  CO2  --  26  28 27 26 25  19*  GLUCOSE  --  315* 158* 116* 155* 216* 155*  BUN  --  71* 64* 39* 21* 11 9  CREATININE  --  1.75* 1.50* 1.18* 1.04* 0.91 1.00  CALCIUM  --  9.2 8.8* 8.2* 8.5* 8.5* 8.2*  MG  --  2.2  --   --  1.7  --   --   PHOS 1.2*  --   --   --   --   --   --    CBC:  Recent Labs Lab 08/02/15 1702 08/02/15 1728 08/04/15 0312 08/05/15 0309 08/06/15 0445 08/07/15 0520  WBC 8.7  --  8.4 5.5 5.3 4.4  NEUTROABS 6.4  --   --   --   --   --   HGB 16.3* 17.3* 12.9 12.0 12.4 13.7  HCT 48.2* 51.0* 36.4 34.6* 35.8* 40.1  MCV 93.8  --  90.1 90.1 91.6 92.6  PLT 189  --  127* 105* 98* 92*   Cardiac Enzymes:  Recent Labs Lab 08/02/15 1702 08/02/15 2330 08/03/15 0626 08/03/15 1215  TROPONINI 0.04* 0.03 0.03 <0.03   CBG:  Recent Labs Lab 08/06/15 0814 08/06/15 1144 08/06/15 1719 08/06/15 2153 08/07/15 0754  GLUCAP 201* 164* 164* 182* 176*    Recent Results (from the past 240 hour(s))  Urine culture     Status: None   Collection Time: 08/02/15  5:56 PM  Result Value Ref Range Status   Specimen Description URINE, CATHETERIZED  Final   Special Requests ADDED ON AT 1822 08/02/15  Final   Culture >=100,000 COLONIES/mL ESCHERICHIA COLI  Final   Report Status 08/05/2015 FINAL  Final   Organism ID, Bacteria ESCHERICHIA COLI  Final      Susceptibility   Escherichia coli - MIC*    AMPICILLIN >=32 RESISTANT Resistant     CEFAZOLIN 32 INTERMEDIATE Intermediate     CEFTRIAXONE <=1 SENSITIVE Sensitive     CIPROFLOXACIN >=4 RESISTANT Resistant     GENTAMICIN <=1 SENSITIVE Sensitive     IMIPENEM <=0.25 SENSITIVE Sensitive     NITROFURANTOIN <=16 SENSITIVE Sensitive     TRIMETH/SULFA <=20 SENSITIVE Sensitive     AMPICILLIN/SULBACTAM 16 INTERMEDIATE Intermediate     PIP/TAZO 8 SENSITIVE Sensitive     * >=100,000 COLONIES/mL ESCHERICHIA COLI  MRSA PCR Screening     Status: Abnormal   Collection Time: 08/02/15  9:14 PM  Result Value Ref Range Status   MRSA by PCR POSITIVE (A)  NEGATIVE Final    Comment:        The GeneXpert MRSA Assay (FDA approved for NASAL specimens only), is one component of a comprehensive MRSA colonization surveillance program. It is not intended to diagnose MRSA infection nor to guide or monitor treatment for MRSA infections. RESULT CALLED TO, READ BACK BY AND VERIFIED WITH: B REAP RN 2324 08/02/15 A BROWNING   C difficile quick scan w PCR reflex     Status: Abnormal   Collection Time: 08/04/15  2:56 PM  Result Value Ref Range Status   C Diff antigen POSITIVE (A) NEGATIVE Final   C Diff toxin NEGATIVE NEGATIVE Final   C Diff interpretation   Final    C. difficile present, but toxin not detected. This indicates colonization. In most cases, this does not require treatment. If patient has signs and symptoms consistent with colitis, consider treatment.      Studies/Results: No results found.  Medications:  Scheduled: . atorvastatin  20 mg Oral QPM  . buPROPion  75 mg Oral BID  . Chlorhexidine Gluconate Cloth  6 each Topical Q0600  . feeding supplement (ENSURE ENLIVE)  237 mL Oral BID BM  . insulin aspart  0-15 Units Subcutaneous TID WC  . insulin detemir  10 Units Subcutaneous Daily  . metroNIDAZOLE  500 mg Oral 3 times per day  . mupirocin ointment  1 application Nasal BID  . potassium chloride  40 mEq Oral Once  . pregabalin  50 mg Oral BID  . QUEtiapine  12.5 mg Oral QHS  . saccharomyces boulardii  250 mg Oral BID   Continuous: . sodium chloride 50 mL/hr at 08/07/15 0025   PRN:  Assessment/Plan:  Principal Problem:   Diabetes mellitus with hyperosmolarity Active Problems:   UTI (lower urinary tract infection)   Atrial fibrillation   Acute hypernatremia   Prerenal azotemia   Elevated troponin   Abnormal EKG   AKI (acute kidney injury)   Atrial fibrillation, unspecified   Hyperglycemia   Acute encephalopathy   C. difficile diarrhea    Acute encephalopathy Multifactorial including UTI, hyperglycemia,  metabolic derangements.  No focal deficits noted. Reorient daily.  According to nursing staff who spoke to the patient's husband, this is her baseline.  She apparently hasn't ambulated in about a year. She has around-the-clock nursing care at home.   Loose stools/possible C. difficile Stool was positive for C. difficile antigen but negative for toxin. This suggests colonization.  However, patient continued to have profuse diarrhea. She was started on Flagyl.  Diarrhea improves but is still there, will discontinue Vantin and continue Flagyl and probiotics.  Nonketotic hyperglycemia, diabetes mellitus Patient was placed on an insulin infusion at the time of admission.  Blood sugars are much better. HbA1c is pending. She was transitioned to subcutaneous insulin.  Blood sugars are reasonably well controlled.  Unclear what she takes for diabetes at home. Home medication list does not mention any glucose lowering agents.  This is considered as new onset diabetes, started on Levemir 10 units  Urinary tract infection with Escherichia coli Sensitivities noted. Change to oral Vantin, already had 5 days of antibiotics, discontinue Vantin.  Thrombocytopenia Platelet counts have dropped significantly since admission. We will hold her heparin. Repeat counts tomorrow morning. No overt bleeding identified.  Dehydration with hypernatremia/hypokalemia Seems to be improving. Sodium level is better. BUN and creatinine improving. Potassium is normal. Magnesium was normal.  Acute renal failure Most likely prerenal in etiology. Renal function has improved with IV fluids. Monitor urine output.  Atrial fibrillation with mild RVR Heart rate is reasonably well controlled. Blood pressure is stable. She is asymptomatic. She is not requiring any rate control medications at this time. She was on digoxin at home, which has been held.  Apparently, she has refused anticoagulation in the past. Home medication list did  suggest that she was on Eliquis, but unclear if she has taken that medication. Cardiology was following. Recommendation is to take aspirin.  Abnormal EKG Diffuse ST depression  noted on EKG. Seen by cardiology. Troponin was mildly elevated but subsequent levels and normal. Patient denies any chest pain. She was not considered a candidate for cardiac catheterization. Echocardiogram report as above. Wall motion abnormalities cannot be ruled out, but her systolic function is normal.  DVT Prophylaxis: SCDs today. Stopped Lovenox due to drop in platelet count.    Code Status: DO NOT RESUSCITATE  Family Communication: No family at bedside. Tried calling her husband with no success.  Disposition Plan: await improvement in diarrhea. Will likely need to be placed. We'll consult Education officer, museum.    LOS: 5 days   [facility]  [facility] Pager [phone]  08/07/2015, 11:18 AM  If 7PM-7AM, please contact night-coverage at [email or web address], password [facility]

## 2015-08-08 LAB — BASIC METABOLIC PANEL
ANION GAP: 9 (ref 5–15)
BUN: 11 mg/dL (ref 6–20)
CHLORIDE: 109 mmol/L (ref 101–111)
CO2: 21 mmol/L — ABNORMAL LOW (ref 22–32)
Calcium: 8.2 mg/dL — ABNORMAL LOW (ref 8.9–10.3)
Creatinine, Ser: 0.93 mg/dL (ref 0.44–1.00)
GFR calc non Af Amer: 57 mL/min — ABNORMAL LOW (ref >60)
Glucose, Bld: 83 mg/dL (ref 65–99)
POTASSIUM: 2.9 mmol/L — AB (ref 3.5–5.1)
SODIUM: 139 mmol/L (ref 135–145)

## 2015-08-08 LAB — GLUCOSE, CAPILLARY
GLUCOSE-CAPILLARY: 119 mg/dL — AB (ref 65–99)
GLUCOSE-CAPILLARY: 142 mg/dL — AB (ref 65–99)
GLUCOSE-CAPILLARY: 126 mg/dL — AB (ref 65–99)

## 2015-08-08 MED ORDER — POTASSIUM CHLORIDE CRYS ER 20 MEQ PO TBCR
40.0000 meq | EXTENDED_RELEASE_TABLET | Freq: Once | ORAL | Status: AC
  Administered 2015-08-08: 40 meq via ORAL
  Filled 2015-08-08: qty 2

## 2015-08-08 MED ORDER — ASPIRIN EC 81 MG PO TBEC: 81.0000 mg | DELAYED_RELEASE_TABLET | Freq: Every day | ORAL | Status: AC

## 2015-08-08 MED ORDER — METRONIDAZOLE 500 MG PO TABS: 500.0000 mg | ORAL_TABLET | Freq: Three times a day (TID) | ORAL | Status: AC

## 2015-08-08 MED ORDER — POTASSIUM CHLORIDE 10 MEQ/100ML IV SOLN
10.0000 meq | INTRAVENOUS | Status: AC
  Administered 2015-08-08 (×4): 10 meq via INTRAVENOUS
  Filled 2015-08-08 (×4): qty 100

## 2015-08-08 MED ORDER — POTASSIUM CHLORIDE CRYS ER 20 MEQ PO TBCR: 20.0000 meq | EXTENDED_RELEASE_TABLET | Freq: Two times a day (BID) | ORAL | Status: AC

## 2015-08-08 MED ORDER — SACCHAROMYCES BOULARDII 250 MG PO CAPS: 250.0000 mg | ORAL_CAPSULE | Freq: Two times a day (BID) | ORAL | Status: AC

## 2015-08-08 MED ORDER — INSULIN DETEMIR 100 UNIT/ML ~~LOC~~ SOLN: 10.0000 [IU] | Freq: Every day | SUBCUTANEOUS | Status: AC

## 2015-08-08 MED ORDER — METFORMIN HCL 500 MG PO TABS: 500.0000 mg | ORAL_TABLET | Freq: Two times a day (BID) | ORAL | Status: AC

## 2015-08-08 NOTE — Discharge Summary (Addendum)
Physician Discharge Summary  Tara Huff WUJ:[id number] DOB: 02-13-1934 DOA: 08/02/2015  PCP: Reginia Naas, MD  Admit date: 08/02/2015 Discharge date: 08/08/2015  Time spent: 40 minutes  Recommendations for Outpatient Follow-up:  1. Follow-up with primary care physician within one week. 2. Patient started on Levemir insulin 10 units for hemoglobin A1c of 14.8. 3. Continue Flagyl for 7 more days. 4. Check BMP/CBC in 1 week, recheck potassium/platelets.  Discharge Diagnoses:  Principal Problem:   Diabetes mellitus with hyperosmolarity Active Problems:   UTI (lower urinary tract infection)   Atrial fibrillation   Acute hypernatremia   Prerenal azotemia   Elevated troponin   Abnormal EKG   AKI (acute kidney injury)   Atrial fibrillation, unspecified   Hyperglycemia   Acute encephalopathy   C. difficile diarrhea   Discharge Condition: Stable  Diet recommendation: Carbohydrate modified diet  Filed Weights   08/02/15 1651 08/02/15 2051 08/06/15 2055  Weight: 72.576 kg (160 lb) 68.9 kg (151 lb 14.4 oz) 70.217 kg (154 lb 12.8 oz)    History of present illness:  Tara Huff is a 81 y.o. female with below past medical history Atrial fibrillation, Hypertension,  MI (myocardial infarction), Anxiety,Depression,Hypercholesterolemia, Shoulder pain, Type 2 diabetes mellitus, CHF (congestive heart failure), OSA (obstructive sleep apnea)who was brought to the emergency department after she was found to be weak, malaised and confused when evaluated by home health nursing earlier in the day. She lives at home with her husband. No further history is available at that time  Hospital Course:   Uncontrolled diabetes mellitus type 2 with hyperglycemic hyperosmolar state Presented with blood sugar of 970, likely has had hyperosmolar hyperglycemic state. Patient was placed on an insulin infusion at the time of admission.  Hemoglobin A1c is 14.8, correlate with me plasma glucose  of 378. Patient discharged on 10 units of Levemir insulin. Unclear what she takes for diabetes at home. Home medication list does not mention any glucose lowering agents.  Had previous hemoglobin A1c of 10.6 in June 2014. This is considered as new onset diabetes, started on Levemir 10 units  Acute encephalopathy Multifactorial including UTI, hyperglycemia, metabolic derangements, but mostly the hyperglycemic state. No focal deficits noted. Reorient daily.  According to nursing staff who spoke to the patient's husband, this is her baseline.  She apparently hasn't ambulated in about a year. She has around-the-clock nursing care at home.   Loose stools/possible C. difficile Stool was positive for C. difficile antigen but negative for toxin. This suggests colonization.  However, patient continued to have profuse diarrhea. She was started on Flagyl.  Diarrhea improved, and Vantin discontinued, on discharge probiotics and Flagyl for 7 more days.  Urinary tract infection with Escherichia coli Sensitivities noted. Change to oral Vantin, received total of 6 days of antibiotics.  Thrombocytopenia Platelet counts have dropped significantly since admission. We will hold her heparin.  Likely secondary to acute illness, recheck CBC in 1 week.  Dehydration with hypernatremia/hypokalemia Improved, potassium 2.9 on day of discharge, received 40 mEq orally and 40 mg IV. Patient to be discharged on 40 mg daily for 5 more days.  Acute renal failure Most likely prerenal in etiology. Secondary to dehydration from his body diuresis, this is resolved.  Atrial fibrillation with mild RVR Heart rate is reasonably well controlled. Blood pressure is stable. She is asymptomatic. She is not requiring any rate control medications at this time. She was on digoxin at home, which has been held. Apparently, she has refused anticoagulation in the  past. Home medication list did suggest that she was on Eliquis, but  unclear if she has taken that medication. Cardiology recommended aspirin.  Abnormal EKG Diffuse ST depression noted on EKG. Seen by cardiology. Troponin was slightly elevated at 0.04, rest of troponin were negative.  Per cardiology not a candidate for anticoagulation or intervention.   Procedures:  None  Consultations:  None  Discharge Exam: Filed Vitals:   08/08/15 0800  BP: 108/59  Pulse: 72  Temp: 98.1 F (36.7 C)  Resp: 14   General: Alert and awake, disoriented HEENT: anicteric sclera, pupils reactive to light and accommodation, EOMI CVS: S1-S2 clear, no murmur rubs or gallops Chest: clear to auscultation bilaterally, no wheezing, rales or rhonchi Abdomen: soft nontender, nondistended, normal bowel sounds, no organomegaly Extremities: no cyanosis, clubbing or edema noted bilaterally Neuro: Cranial nerves II-XII intact, no focal neurological deficits  Discharge Instructions    Current Discharge Medication List    START taking these medications   Details  aspirin EC 81 MG tablet Take 1 tablet (81 mg total) by mouth daily.    insulin detemir (LEVEMIR) 100 UNIT/ML injection Inject 0.1 mLs (10 Units total) into the skin daily. Qty: 10 mL, Refills: 11    metFORMIN (GLUCOPHAGE) 500 MG tablet Take 1 tablet (500 mg total) by mouth 2 (two) times daily with a meal. Qty: 60 tablet, Refills: 0    metroNIDAZOLE (FLAGYL) 500 MG tablet Take 1 tablet (500 mg total) by mouth every 8 (eight) hours. Qty: 21 tablet, Refills: 0    potassium chloride SA (K-DUR,KLOR-CON) 20 MEQ tablet Take 1 tablet (20 mEq total) by mouth 2 (two) times daily. Qty: 10 tablet, Refills: 0    saccharomyces boulardii (FLORASTOR) 250 MG capsule Take 1 capsule (250 mg total) by mouth 2 (two) times daily. Qty: 60 capsule, Refills: 0      CONTINUE these medications which have NOT CHANGED   Details  acetaminophen (TYLENOL) 500 MG tablet Take 500-1,000 mg by mouth every 6 (six) hours as needed for  headache (pain).    atorvastatin (LIPITOR) 20 MG tablet Take 20 mg by mouth every evening. Refills: 5    buPROPion (WELLBUTRIN) 75 MG tablet Take 75 mg by mouth 2 (two) times daily. Refills: 5    carvedilol (COREG) 3.125 MG tablet Take 1 tablet (3.125 mg total) by mouth 2 (two) times daily with a meal.    digoxin (LANOXIN) 0.125 MG tablet Take 125 mcg by mouth daily. Every morning Refills: 5    famotidine (PEPCID) 20 MG tablet Take 20 mg by mouth 2 (two) times daily. Refills: 5    ferrous sulfate 325 (65 FE) MG tablet Take 325 mg by mouth daily with breakfast.    Melatonin 3 MG TABS Take 3 mg by mouth at bedtime.    pregabalin (LYRICA) 50 MG capsule Take 50 mg by mouth 2 (two) times daily.    traMADol (ULTRAM) 50 MG tablet Take 50 mg by mouth every 6 (six) hours as needed (for pain when lyrica is not available).  Refills: 0      STOP taking these medications     apixaban (ELIQUIS) 2.5 MG TABS tablet      hydrochlorothiazide (HYDRODIURIL) 12.5 MG tablet        No Known Allergies Follow-up Information    Follow up with Reginia Naas, MD In 1 week.   Specialty:  Family Medicine   Contact information:   [place] [address] [phone]  The results of significant diagnostics from this hospitalization (including imaging, microbiology, ancillary and laboratory) are listed below for reference.    Significant Diagnostic Studies: Dg Chest Port 1 View  08/02/2015   CLINICAL DATA:  Altered mental status for 2 days.  Weakness.  EXAM: PORTABLE CHEST 1 VIEW  COMPARISON:  03/23/2014  FINDINGS: Left base atelectasis or infiltrate. Right lung is clear. Heart is borderline in size. No effusions. No acute bony abnormality. Degenerative changes in the shoulders.  IMPRESSION: Left base atelectasis or infiltrate.   Electronically Signed   By: Rolm Baptise M.D.   On: 08/02/2015 17:36    Microbiology: Recent Results (from the past 240  hour(s))  Urine culture     Status: None   Collection Time: 08/02/15  5:56 PM  Result Value Ref Range Status   Specimen Description URINE, CATHETERIZED  Final   Special Requests ADDED ON AT 1822 08/02/15  Final   Culture >=100,000 COLONIES/mL ESCHERICHIA COLI  Final   Report Status 08/05/2015 FINAL  Final   Organism ID, Bacteria ESCHERICHIA COLI  Final      Susceptibility   Escherichia coli - MIC*    AMPICILLIN >=32 RESISTANT Resistant     CEFAZOLIN 32 INTERMEDIATE Intermediate     CEFTRIAXONE <=1 SENSITIVE Sensitive     CIPROFLOXACIN >=4 RESISTANT Resistant     GENTAMICIN <=1 SENSITIVE Sensitive     IMIPENEM <=0.25 SENSITIVE Sensitive     NITROFURANTOIN <=16 SENSITIVE Sensitive     TRIMETH/SULFA <=20 SENSITIVE Sensitive     AMPICILLIN/SULBACTAM 16 INTERMEDIATE Intermediate     PIP/TAZO 8 SENSITIVE Sensitive     * >=100,000 COLONIES/mL ESCHERICHIA COLI  MRSA PCR Screening     Status: Abnormal   Collection Time: 08/02/15  9:14 PM  Result Value Ref Range Status   MRSA by PCR POSITIVE (A) NEGATIVE Final    Comment:        The GeneXpert MRSA Assay (FDA approved for NASAL specimens only), is one component of a comprehensive MRSA colonization surveillance program. It is not intended to diagnose MRSA infection nor to guide or monitor treatment for MRSA infections. RESULT CALLED TO, READ BACK BY AND VERIFIED WITH: B REAP RN 2324 08/02/15 A BROWNING   C difficile quick scan w PCR reflex     Status: Abnormal   Collection Time: 08/04/15  2:56 PM  Result Value Ref Range Status   C Diff antigen POSITIVE (A) NEGATIVE Final   C Diff toxin NEGATIVE NEGATIVE Final   C Diff interpretation   Final    C. difficile present, but toxin not detected. This indicates colonization. In most cases, this does not require treatment. If patient has signs and symptoms consistent with colitis, consider treatment.     Labs: Basic Metabolic Panel:  Recent Labs Lab 08/02/15 2300 08/03/15 0105   08/04/15 6333 08/05/15 0309 08/06/15 0445 08/07/15 0520 08/08/15 0523  NA  --  148*  < > 139 140 140 137 139  K  --  3.3*  < > 3.3* 3.2* 3.7 3.4* 2.9*  CL  --  112*  < > 104 109 108 109 109  CO2  --  26  < > 27 26 25  19* 21*  GLUCOSE  --  315*  < > 116* 155* 216* 155* 83  BUN  --  71*  < > 39* 21* 11 9 11   CREATININE  --  1.75*  < > 1.18* 1.04* 0.91 1.00 0.93  CALCIUM  --  9.2  < >  8.2* 8.5* 8.5* 8.2* 8.2*  MG  --  2.2  --   --  1.7  --   --   --   PHOS 1.2*  --   --   --   --   --   --   --   < > = values in this interval not displayed. Liver Function Tests: No results for input(s): AST, ALT, ALKPHOS, BILITOT, PROT, ALBUMIN in the last 168 hours. No results for input(s): LIPASE, AMYLASE in the last 168 hours. No results for input(s): AMMONIA in the last 168 hours. CBC:  Recent Labs Lab 08/02/15 1702 08/02/15 1728 08/04/15 0312 08/05/15 0309 08/06/15 0445 08/07/15 0520  WBC 8.7  --  8.4 5.5 5.3 4.4  NEUTROABS 6.4  --   --   --   --   --   HGB 16.3* 17.3* 12.9 12.0 12.4 13.7  HCT 48.2* 51.0* 36.4 34.6* 35.8* 40.1  MCV 93.8  --  90.1 90.1 91.6 92.6  PLT 189  --  127* 105* 98* 92*   Cardiac Enzymes:  Recent Labs Lab 08/02/15 1702 08/02/15 2330 08/03/15 0626 08/03/15 1215  TROPONINI 0.04* 0.03 0.03 <0.03   BNP: BNP (last 3 results) No results for input(s): BNP in the last 8760 hours.  ProBNP (last 3 results) No results for input(s): PROBNP in the last 8760 hours.  CBG:  Recent Labs Lab 08/07/15 0754 08/07/15 1126 08/07/15 1638 08/07/15 2250 08/08/15 0841  GLUCAP 176* 150* 116* 82 119*       Signed:  ELMAHI,MUTAZ A  [facility] 08/08/2015, 11:08 AM

## 2015-08-08 NOTE — Care Management Note (Signed)
Case Management Note  Patient Details  Name: Tara Huff MRN: [id number] Date of Birth: June 04, 1934  Subjective/Objective:               CM following since adm for progression and d/c planning.     Action/Plan: 08/08/2015 Pt not responding to CM , this CM spoke with pt husband who states that the pt has all the DME requested by PT eval, this includes, hospital bed, wheelchair, walker and 3:1. Pt husband is declining HH as the pt refuses with work with [facility]. Pt has 24 hr care and her caregive does range of motion exercises for this pt at home. The Caregiver is also able to get this pt to take her medications in the home environment.  Dr Hartford Poli notified.   Expected Discharge Date:  08/06/15               Expected Discharge Plan:  Home/Self Care  In-House Referral:  Clinical Social Work  Discharge planning Services  CM Consult  Post Acute Care Choice:  NA Choice offered to:  NA  DME Arranged:    DME Agency:     HH Arranged:    HH Agency:     Status of Service:  Completed, signed off  Medicare Important Message Given:  Yes-second notification given Date Medicare IM Given:    Medicare IM give by:    Date Additional Medicare IM Given:    Additional Medicare Important Message give by:     If discussed at [place] of Stay Meetings, dates discussed:    Additional Comments:  Adron Bene, RN 08/08/2015, 12:40 PM

## 2015-08-08 NOTE — Clinical Social Work Note (Signed)
Patient medically stable for discharge home today. Mr. Lonell Grandchild (husband) requested ambulance transport for patient.  CSW will arrange transport home via ambulance service ([facility]).   Crawford Givens, MSW, LCSW Licensed Clinical Social Worker [place] [phone]

## 2015-08-08 NOTE — Progress Notes (Signed)
Discharge instructions gone over with husband over the phone. Packet sent with EMS. Susie Cassette RN

## 2016-12-10 DEATH — deceased
